# Patient Record
Sex: Male | Born: 1955
Health system: Southern US, Community
[De-identification: ages and names within clinical notes are randomized; demographics above are authoritative.]

## PROBLEM LIST (undated history)

## (undated) DIAGNOSIS — F329 Major depressive disorder, single episode, unspecified: Secondary | ICD-10-CM

## (undated) DIAGNOSIS — N433 Hydrocele, unspecified: Secondary | ICD-10-CM

## (undated) DIAGNOSIS — F32A Depression, unspecified: Secondary | ICD-10-CM

## (undated) DIAGNOSIS — N200 Calculus of kidney: Secondary | ICD-10-CM

## (undated) DIAGNOSIS — B1920 Unspecified viral hepatitis C without hepatic coma: Secondary | ICD-10-CM

## (undated) DIAGNOSIS — F419 Anxiety disorder, unspecified: Secondary | ICD-10-CM

## (undated) DIAGNOSIS — F209 Schizophrenia, unspecified: Secondary | ICD-10-CM

## (undated) DIAGNOSIS — G4700 Insomnia, unspecified: Secondary | ICD-10-CM

## (undated) DIAGNOSIS — N451 Epididymitis: Secondary | ICD-10-CM

## (undated) DIAGNOSIS — R7301 Impaired fasting glucose: Secondary | ICD-10-CM

## (undated) DIAGNOSIS — C801 Malignant (primary) neoplasm, unspecified: Secondary | ICD-10-CM

## (undated) DIAGNOSIS — I10 Essential (primary) hypertension: Secondary | ICD-10-CM

## (undated) HISTORY — DX: Epididymitis: N45.1

## (undated) HISTORY — DX: Hydrocele, unspecified: N43.3

## (undated) HISTORY — DX: Insomnia, unspecified: G47.00

## (undated) HISTORY — DX: Major depressive disorder, single episode, unspecified: F32.9

## (undated) HISTORY — DX: Unspecified viral hepatitis C without hepatic coma: B19.20

## (undated) HISTORY — DX: Anxiety disorder, unspecified: F41.9

## (undated) HISTORY — PX: ANKLE FRACTURE SURGERY: SHX122

## (undated) HISTORY — DX: Schizophrenia, unspecified: F20.9

## (undated) HISTORY — DX: Calculus of kidney: N20.0

## (undated) HISTORY — DX: Depression, unspecified: F32.A

## (undated) HISTORY — DX: Impaired fasting glucose: R73.01

---

## 2004-04-14 ENCOUNTER — Other Ambulatory Visit: Payer: Self-pay

## 2004-10-21 ENCOUNTER — Emergency Department: Payer: Self-pay | Admitting: Emergency Medicine

## 2007-09-29 ENCOUNTER — Emergency Department: Payer: Self-pay | Admitting: Emergency Medicine

## 2007-12-21 ENCOUNTER — Ambulatory Visit: Payer: Self-pay | Admitting: Gastroenterology

## 2008-01-07 ENCOUNTER — Ambulatory Visit: Payer: Self-pay | Admitting: General Surgery

## 2008-01-11 ENCOUNTER — Ambulatory Visit: Payer: Self-pay | Admitting: General Surgery

## 2008-09-19 HISTORY — PX: COLONOSCOPY: SHX174

## 2008-12-19 ENCOUNTER — Emergency Department: Payer: Self-pay | Admitting: Emergency Medicine

## 2009-02-22 ENCOUNTER — Emergency Department: Payer: Self-pay | Admitting: Emergency Medicine

## 2009-07-08 ENCOUNTER — Emergency Department: Payer: Self-pay | Admitting: Emergency Medicine

## 2009-07-10 ENCOUNTER — Emergency Department: Payer: Self-pay | Admitting: Emergency Medicine

## 2009-09-19 HISTORY — PX: HERNIA REPAIR: SHX51

## 2010-05-11 ENCOUNTER — Emergency Department: Payer: Self-pay | Admitting: Internal Medicine

## 2010-05-16 ENCOUNTER — Emergency Department: Payer: Self-pay | Admitting: Emergency Medicine

## 2011-12-17 ENCOUNTER — Emergency Department: Payer: Self-pay | Admitting: Emergency Medicine

## 2011-12-18 LAB — URINALYSIS, COMPLETE
Blood: NEGATIVE
Glucose,UR: NEGATIVE mg/dL (ref 0–75)
Ketone: NEGATIVE
Leukocyte Esterase: NEGATIVE
Nitrite: NEGATIVE
Ph: 5 (ref 4.5–8.0)
Protein: NEGATIVE
RBC,UR: 2 /HPF (ref 0–5)
Specific Gravity: 1.025 (ref 1.003–1.030)
WBC UR: 2 /HPF (ref 0–5)

## 2011-12-18 LAB — COMPREHENSIVE METABOLIC PANEL
Albumin: 3.7 g/dL (ref 3.4–5.0)
Alkaline Phosphatase: 65 U/L (ref 50–136)
BUN: 12 mg/dL (ref 7–18)
Bilirubin,Total: 0.5 mg/dL (ref 0.2–1.0)
Chloride: 107 mmol/L (ref 98–107)
Co2: 24 mmol/L (ref 21–32)
Creatinine: 0.98 mg/dL (ref 0.60–1.30)
Glucose: 113 mg/dL — ABNORMAL HIGH (ref 65–99)
Osmolality: 284 (ref 275–301)
Potassium: 3.5 mmol/L (ref 3.5–5.1)
Sodium: 142 mmol/L (ref 136–145)

## 2011-12-18 LAB — CBC
RBC: 4.88 10*6/uL (ref 4.40–5.90)
RDW: 13.1 % (ref 11.5–14.5)

## 2012-09-08 ENCOUNTER — Inpatient Hospital Stay: Payer: Self-pay | Admitting: Psychiatry

## 2012-09-08 LAB — CBC
HCT: 43.7 % (ref 40.0–52.0)
MCHC: 35.4 g/dL (ref 32.0–36.0)
Platelet: 289 10*3/uL (ref 150–440)
RBC: 4.68 10*6/uL (ref 4.40–5.90)
RDW: 13.1 % (ref 11.5–14.5)
WBC: 7.7 10*3/uL (ref 3.8–10.6)

## 2012-09-08 LAB — URINALYSIS, COMPLETE
Bilirubin,UR: NEGATIVE
Glucose,UR: NEGATIVE mg/dL (ref 0–75)
Ketone: NEGATIVE
Leukocyte Esterase: NEGATIVE
Nitrite: NEGATIVE
Ph: 5 (ref 4.5–8.0)
Protein: NEGATIVE
RBC,UR: <1 /HPF (ref 0–5)
Specific Gravity: 1.02 (ref 1.003–1.030)
Squamous Epithelial: NONE SEEN
WBC UR: <1 /HPF (ref 0–5)

## 2012-09-08 LAB — SALICYLATE LEVEL: Salicylates, Serum: <1.7 mg/dL

## 2012-09-08 LAB — COMPREHENSIVE METABOLIC PANEL
BUN: 14 mg/dL (ref 7–18)
Bilirubin,Total: 0.5 mg/dL (ref 0.2–1.0)
Chloride: 106 mmol/L (ref 98–107)
Creatinine: 1.2 mg/dL (ref 0.60–1.30)
EGFR (African American): >60
Osmolality: 281 (ref 275–301)
Potassium: 3.7 mmol/L (ref 3.5–5.1)
Sodium: 140 mmol/L (ref 136–145)
Total Protein: 8.1 g/dL (ref 6.4–8.2)

## 2012-09-08 LAB — ETHANOL: Ethanol %: <0.003 % (ref 0.000–0.080)

## 2012-09-08 LAB — DRUG SCREEN, URINE
Amphetamines, Ur Screen: NEGATIVE (ref ?–1000)
Benzodiazepine, Ur Scrn: NEGATIVE (ref ?–200)
Cannabinoid 50 Ng, Ur ~~LOC~~: NEGATIVE (ref ?–50)
Methadone, Ur Screen: NEGATIVE (ref ?–300)
Phencyclidine (PCP) Ur S: NEGATIVE (ref ?–25)
Tricyclic, Ur Screen: NEGATIVE (ref ?–1000)

## 2012-09-08 LAB — TSH: Thyroid Stimulating Horm: 6.69 u[IU]/mL — ABNORMAL HIGH

## 2013-01-02 DIAGNOSIS — R6250 Unspecified lack of expected normal physiological development in childhood: Secondary | ICD-10-CM | POA: Insufficient documentation

## 2014-03-01 ENCOUNTER — Ambulatory Visit: Payer: Self-pay | Admitting: Family Medicine

## 2014-09-07 ENCOUNTER — Emergency Department: Payer: Self-pay | Admitting: Emergency Medicine

## 2014-09-07 LAB — RAPID HIV SCREEN (HIV 1/2 AB+AG)

## 2014-10-12 ENCOUNTER — Emergency Department: Payer: Self-pay | Admitting: Emergency Medicine

## 2015-01-05 ENCOUNTER — Emergency Department
Admit: 2015-01-05 | Disposition: A | Discharge status: Home / Self Care | Payer: Self-pay | Admitting: Emergency Medicine

## 2015-01-05 LAB — URINALYSIS, COMPLETE
BACTERIA: NONE SEEN
Bilirubin,UR: NEGATIVE
Blood: NEGATIVE
GLUCOSE, UR: NEGATIVE mg/dL (ref 0–75)
Ketone: NEGATIVE
Leukocyte Esterase: NEGATIVE
NITRITE: NEGATIVE
Ph: 5 (ref 4.5–8.0)
Protein: NEGATIVE
Specific Gravity: 1.023 (ref 1.003–1.030)
Squamous Epithelial: NONE SEEN

## 2015-01-06 NOTE — H&P (Signed)
PATIENT NAME:  Mitchell [name], Mitchell [name] MR#:  [id number] DATE OF BIRTH:  [birth year]  DATE OF ADMISSION:  09/07/2012  INITIAL ASSESSMENT AND PSYCHIATRIC EVALUATION   IDENTIFYING INFORMATION: The patient is a 59 year old white male, not employed and last worked 2 or 3 months ago in [employer].  The patient divorced twice and has been living with a girlfriend.  CHIEF COMPLAINT: The patient comes for first inpatient on psychiatry at [facility] with a chief complaint, "I am having problems with withdrawal from lorazepam and Ambien as my doctor took me off because it is dangerous to my liver, and he put me on Seroquel which will help me rest; and I feel I am paranoid since then because I am going through withdrawal."   HISTORY OF PRESENT ILLNESS: According to information obtained from the chart, the patient has been feeling paranoid, and he is fearful for his life because of friends and family causing him problems.  He was on lorazepam 1 mg and Ambien at bedtime for sleep, and his doctor thought that it is not good for his liver as he already has hepatitis C and B; so he stopped this medication 1-1/2 months ago and put him on Seroquel to help him rest at night.  The patient reports that he can feel the withdrawal, and this is making him anxious and paranoid.  PAST PSYCHIATRIC HISTORY: No previous history of inpatient on psychiatry. No history of suicide attempt. He is not being followed by any psychiatrist at this time, is being followed by Dr. Jefm Bryant, who tried to discontinue him off lorazepam and Ambien which he has been taking for quite some time.   FAMILY HISTORY OF MENTAL ILLNESS: Cousins and other family members had depression.  No known history of suicides in the family.   FAMILY HISTORY: He was raised by parents.  Father worked in a [place], and father lives in an assisted living facility and is in his 28s.  Mother died of diabetic complications.  He has 2 brothers, 71 half-sister, 1 half-brother.  He  is not close to family and said, "so.so."  PERSONAL HISTORY: He was born at old [facility].  He dropped out in 7th grade because he was dumb and stupid, no GED.  WORK HISTORY:  He joined the [employer] but did not finish his basic training.  His first job was in a [place] at age 89 years.  This job lasted for a while.  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   MARRIAGES: Married twice.  The first marriage ended because she found another man. He has 2 sons who are 22 and 12 years old.  He is in touch with them sometimes.  His second marriage ended because of her having mental illness.  No children from the second marriage.    ALCOHOL AND DRUGS: He denies drinking alcohol.  He denies street or prescription drug abuse.  He denies smoking nicotine cigarettes.   MEDICAL HISTORY: Labile hypertension, diabetes mellitus, status post injury to left ankle and surgery with steel screws put in and steel rods put in. He hurt himself falling off a 28-foot ladder, remote. No history of motor vehicle accident.  He has never been unconscious.  He has hepatitis C and B positive by shooting up dope many years ago from a friend.  ALLERGIES: CODEINE.  PRIMARY CARE PHYSICIAN: He is being followed  by Dr. Jefm Bryant.  Last appointment was 1-1/2 months ago.  Next appointment is to be made.   PHYSICAL EXAMINATION: VITAL SIGNS:  Temperature is 95.6, pulse is 70 per minute regular, respirations 18 per minute regular, blood pressure is 130/71 mmHg. HEENT: Head is normocephalic, atraumatic. Eyes: PERRLA. Fundi are bilateral. EOMs are visualized. Tympanic membranes are visualized. No exudate. NECK:  Supple without any organomegaly, lymphadenopathy or thyromegaly. CHEST: Normal expansion, normal breath sounds. HEART:  Normal S1, S2 heard.  ABDOMEN:  Soft, no organomegaly. Bowel sounds heard. RECTAL: Deferred. NEUROLOGICAL:  Gait  is normal. Romberg is negative. Cranial nerves II through XII are intact. DTRs are 2+.  Plantars have normal response.  MENTAL STATUS EXAMINATION:  The patient is dressed in hospital scrubs, alert and oriented to place, person and time, aware of the situation that brought him for admission to [facility]. He knew the capitol of [place], [place], name of the current Software engineer, previous Software engineer.  He admits feeling low and down and depressed about his withdrawal from benzodiazepines.  He admits feeling hopeless and helpless at times.  He denies feeling worthless or useless.  He denies suicidal or homicidal plans.  He admits feeling paranoid about his girlfriend and the woman that she lives with.  He admits feeling suspicious about her.  He denies hearing voices, seeing things.  He denies paranoid or suspicious ideas. He denies thought insertion and thought control.  He could spell the word world forward and backward.  He could count money. Cognition is grossly intact. Judgment is intact. He does admit to withdrawal feelings which are causing him to have paranoia and suspicion and he realizes the same. Insight and judgment are guarded.   IMPRESSION: AXIS I:   1. Benzodiazepine withdrawal and anxiety related to the same. 2. History of cocaine dependence, in remission for many, many years.  3. Psychosis secondary to benzodiazepine withdrawal to be ruled out.   AXIS II:  Deferred.   AXIS III:  1. Hepatitis B and C positive. 2. Labile hypertension. 3. Borderline diabetes mellitus. 4. Status post surgery on left ankle after status post injury after a fall from a 28-foot ladder, remote, and surgery done for the same.  AXIS IV: Severe. Occupational, financial and currently paranoid and going through anxiety from withdrawal from benzodiazepine.   AXIS V:  Global Assessment of Functioning 25.  PLAN:  The patient is admitted to [facility] for close observation and  management. He will be started on Klonopin low-dose to help him with his withdrawal from benzodiazepine.  During the stay in the hospital, he will be given milieu therapy and supportive counseling, and benzodiazepine withdrawal will be addressed.    At the time of discharge, he will be stabilized and will not have any withdrawal symptoms, and he will not have any paranoia as he is on medication for the same. Appropriate follow-up appointment will be made in the community.   ____________________________ Wallace Cullens. Franchot Mimes, MD skc:cb D: 09/08/2012 20:18:07 ET T: 09/09/2012 08:10:18 ET JOB#: [id number]  cc: Arlyn Leak K. Franchot Mimes, MD, <Dictator> Dewain Penning MD ELECTRONICALLY SIGNED 09/09/2012 20:53

## 2015-02-04 ENCOUNTER — Emergency Department: Payer: No Typology Code available for payment source

## 2015-02-04 ENCOUNTER — Encounter: Payer: Self-pay | Admitting: Emergency Medicine

## 2015-02-04 ENCOUNTER — Emergency Department
Admission: EM | Admit: 2015-02-04 | Discharge: 2015-02-04 | Disposition: A | Discharge status: Home / Self Care | Payer: No Typology Code available for payment source | Attending: Emergency Medicine | Admitting: Emergency Medicine

## 2015-02-04 DIAGNOSIS — N50811 Right testicular pain: Secondary | ICD-10-CM

## 2015-02-04 DIAGNOSIS — Y9289 Other specified places as the place of occurrence of the external cause: Secondary | ICD-10-CM | POA: Insufficient documentation

## 2015-02-04 DIAGNOSIS — Y9389 Activity, other specified: Secondary | ICD-10-CM | POA: Diagnosis not present

## 2015-02-04 DIAGNOSIS — Y998 Other external cause status: Secondary | ICD-10-CM | POA: Diagnosis not present

## 2015-02-04 DIAGNOSIS — W57XXXA Bitten or stung by nonvenomous insect and other nonvenomous arthropods, initial encounter: Secondary | ICD-10-CM | POA: Diagnosis not present

## 2015-02-04 DIAGNOSIS — S30861A Insect bite (nonvenomous) of abdominal wall, initial encounter: Secondary | ICD-10-CM

## 2015-02-04 DIAGNOSIS — Z79899 Other long term (current) drug therapy: Secondary | ICD-10-CM | POA: Insufficient documentation

## 2015-02-04 DIAGNOSIS — N508 Other specified disorders of male genital organs: Secondary | ICD-10-CM | POA: Insufficient documentation

## 2015-02-04 DIAGNOSIS — S70361A Insect bite (nonvenomous), right thigh, initial encounter: Secondary | ICD-10-CM | POA: Insufficient documentation

## 2015-02-04 DIAGNOSIS — R2241 Localized swelling, mass and lump, right lower limb: Secondary | ICD-10-CM | POA: Diagnosis present

## 2015-02-04 HISTORY — DX: Essential (primary) hypertension: I10

## 2015-02-04 LAB — URINALYSIS COMPLETE WITH MICROSCOPIC (ARMC ONLY)
BILIRUBIN URINE: NEGATIVE
Bacteria, UA: NONE SEEN
Glucose, UA: NEGATIVE mg/dL
Hgb urine dipstick: NEGATIVE
KETONES UR: NEGATIVE mg/dL
Leukocytes, UA: NEGATIVE
Nitrite: NEGATIVE
PROTEIN: NEGATIVE mg/dL
SPECIFIC GRAVITY, URINE: 1.021 (ref 1.005–1.030)
SQUAMOUS EPITHELIAL / LPF: NONE SEEN
pH: 6 (ref 5.0–8.0)

## 2015-02-04 MED ORDER — OXYCODONE-ACETAMINOPHEN 5-325 MG PO TABS
ORAL_TABLET | ORAL | Status: AC
  Administered 2015-02-04: 2 via ORAL
  Filled 2015-02-04: qty 1

## 2015-02-04 MED ORDER — KETOROLAC TROMETHAMINE 10 MG PO TABS
ORAL_TABLET | ORAL | Status: AC
  Administered 2015-02-04: 10 mg via ORAL
  Filled 2015-02-04: qty 1

## 2015-02-04 MED ORDER — DOXYCYCLINE HYCLATE 100 MG PO TABS
100.0000 mg | ORAL_TABLET | Freq: Once | ORAL | Status: AC
  Administered 2015-02-04: 100 mg via ORAL
  Filled 2015-02-04: qty 1

## 2015-02-04 MED ORDER — OXYCODONE-ACETAMINOPHEN 5-325 MG PO TABS
2.0000 | ORAL_TABLET | ORAL | Status: AC
  Administered 2015-02-04: 2 via ORAL

## 2015-02-04 MED ORDER — OXYCODONE-ACETAMINOPHEN 5-325 MG PO TABS
ORAL_TABLET | ORAL | Status: DC
Start: 2015-02-04 — End: 2015-02-04
  Filled 2015-02-04: qty 1

## 2015-02-04 MED ORDER — OXYCODONE-ACETAMINOPHEN 5-325 MG PO TABS: 1.0000 | ORAL_TABLET | ORAL | Status: DC | PRN

## 2015-02-04 MED ORDER — DOXYCYCLINE HYCLATE 50 MG PO CAPS: 100.0000 mg | ORAL_CAPSULE | Freq: Two times a day (BID) | ORAL | Status: DC

## 2015-02-04 MED ORDER — DOXYCYCLINE HYCLATE 100 MG PO TABS: 100.0000 mg | ORAL_TABLET | Freq: Once | ORAL | Status: DC

## 2015-02-04 MED ORDER — KETOROLAC TROMETHAMINE 10 MG PO TABS
10.0000 mg | ORAL_TABLET | ORAL | Status: AC
  Administered 2015-02-04: 10 mg via ORAL

## 2015-02-04 NOTE — ED Notes (Signed)
Swelling to right testicle   Hx of same and dx'd with infection

## 2015-02-04 NOTE — Discharge Instructions (Signed)
As we discussed, we did not identify a specific reason for your pain today.  We recommend that you support the testicles either by patting her underwear wearing a jockstrap.  Try using an ice pack for the pain and swelling and take over-the-counter medications such as ibuprofen.  I also prescribed narcotic pain medicine that he can take for severe pain.  The most important thing is to follow-up with Dr. Erlene Quan, the urologist, with a phone call tomorrow for the next available office appointment.  We also prescribed an antibiotic for the tick exposure and rash that you have.  Please take the full course of medication as prescribed.   Testicular Self-Exam A self-exam of your testicles is looking at and feeling your testicles for abnormal lumps or swelling. Several things can cause swelling, lumps, or pain in your testicles. Some of these causes are:  Injuries.  Puffiness, redness, and soreness (inflammation).  Infection.  Extra fluids around your testicle.  Twisted testicles.  Testicular cancer. The testicles are easiest to check after warm baths or showers. They are harder to examine when you are cold.  Follow these steps while you are standing: 1. Hold your penis away from your body. 2. Roll one testicle between your thumb and finger. Feel the entire testicle. 3. Roll the other testicle between your thumb and finger. Feel the entire testicle. Feel for lumps, swelling, or discomfort. A normal testicle is egg shaped. It feels firm. It is smooth and not tender. The spermatic cord feels like a firm spaghetti-like cord. It is at the back of your testicle. Examine the crease between the front of your leg and your abdomen. Feel for any bumps that are tender. These could be enlarged lymph nodes.  Document Released: 12/02/2008 Document Revised: 06/26/2013 Document Reviewed: 02/25/2013 [facility] Patient Information 2015 [place], [place]. This information is not intended to replace advice given to you  by your health care provider. Make sure you discuss any questions you have with your health care provider.  Tick Bite Information Ticks are insects that attach themselves to the skin. There are many types of ticks. Common types include wood ticks and deer ticks. Sometimes, ticks carry diseases that can make a person very ill. The most common places for ticks to attach themselves are the scalp, neck, armpits, waist, and groin.  HOW CAN YOU PREVENT TICK BITES? Take these steps to help prevent tick bites when you are outdoors:  Wear long sleeves and long pants.  Wear white clothes so you can see ticks more easily.  Tuck your pant legs into your socks.  If walking on a trail, stay in the middle of the trail to avoid brushing against bushes.  Avoid walking through areas with long grass.  Put bug spray on all skin that is showing and along boot tops, pant legs, and sleeve cuffs.  Check clothes, hair, and skin often and before going inside.  Brush off any ticks that are not attached.  Take a shower or bath as soon as possible after being outdoors. HOW SHOULD YOU REMOVE A TICK? Ticks should be removed as soon as possible to help prevent diseases. 4. If latex gloves are available, put them on before trying to remove a tick. 5. Use tweezers to grasp the tick as close to the skin as possible. You may also use curved forceps or a tick removal tool. Grasp the tick as close to its head as possible. Avoid grasping the tick on its body. 6. Pull gently upward until  the tick lets go. Do not twist the tick or jerk it suddenly. This may break off the tick's head or mouth parts. 7. Do not squeeze or crush the tick's body. This could force disease-carrying fluids from the tick into your body. 8. After the tick is removed, wash the bite area and your hands with soap and water or alcohol. 9. Apply a small amount of antiseptic cream or ointment to the bite site. 10. Wash any tools that were used. Do not try  to remove a tick by applying a hot match, petroleum jelly, or fingernail polish to the tick. These methods do not work. They may also increase the chances of disease being spread from the tick bite. WHEN SHOULD YOU SEEK HELP? Contact your health care provider if you are unable to remove a tick or if a part of the tick breaks off in the skin. After a tick bite, you need to watch for signs and symptoms of diseases that can be spread by ticks. Contact your health care provider if you develop any of the following:  Fever.  Rash.  Redness and puffiness (swelling) in the area of the tick bite.  Tender, puffy lymph glands.  Watery poop (diarrhea).  Weight loss.  Cough.  Feeling more tired than normal (fatigue).  Muscle, joint, or bone pain.  Belly (abdominal) pain.  Headache.  Change in your level of consciousness.  Trouble walking or moving your legs.  Loss of feeling (numbness) in the legs.  Loss of movement (paralysis).  Shortness of breath.  Confusion.  Throwing up (vomiting) many times. Document Released: 11/30/2009 Document Revised: 05/08/2013 Document Reviewed: 02/13/2013 [facility] Patient Information 2015 [place], [place]. This information is not intended to replace advice given to you by your health care provider. Make sure you discuss any questions you have with your health care provider.

## 2015-02-04 NOTE — ED Provider Notes (Signed)
[facility] Emergency Department Provider Note  ____________________________________________  Time seen: Approximately 6:26 PM  I have reviewed the triage vital signs and the nursing notes.   HISTORY  Chief Complaint Groin Swelling    HPI NAHOME BUBLITZ is a 59 y.o. male with a recent history of being treated for epididymitis who presents with right testicular swelling and pain starting today.  He is concerned because it feels the same as before. The patient does not identify any specific trauma, but he did admit that his girlfriend likes "rough sex" and that it is possible that may contribute.  He has one sexual partner with whom he has been monogamous for 3 months.  They both took antibiotics when he was treated about a month ago.  He denies fever, chills, chest pain, shortness of breath, nausea, vomiting.  The pain is severe but intermittent.   Past Medical History  Diagnosis Date  . Hypertension     There are no active problems to display for this patient.   History reviewed. No pertinent past surgical history.  Current Outpatient Rx  Name  Route  Sig  Dispense  Refill  . clonazePAM (KLONOPIN) 0.5 MG tablet   Oral   Take 0.5 mg by mouth 3 (three) times daily as needed for anxiety.         Marland Kitchen doxycycline (VIBRAMYCIN) 50 MG capsule   Oral   Take 2 capsules (100 mg total) by mouth 2 (two) times daily.   40 capsule   0   . oxyCODONE-acetaminophen (ROXICET) 5-325 MG per tablet   Oral   Take 1-2 tablets by mouth every 4 (four) hours as needed for severe pain.   20 tablet   0     Allergies Codeine  No family history on file.  Social History History  Substance Use Topics  . Smoking status: Never Smoker   . Smokeless tobacco: Not on file  . Alcohol Use: No    Review of Systems Constitutional: No fever/chills Eyes: No visual changes. ENT: No sore throat. Cardiovascular: Denies chest pain. Respiratory: Denies shortness of  breath. Gastrointestinal: No abdominal pain.  No nausea, no vomiting.  No diarrhea.  No constipation. Genitourinary: Negative for dysuria.  Right testicular pain and swelling Musculoskeletal: Negative for back pain. Skin: Negative for rash. Neurological: Negative for headaches, focal weakness or numbness.  10-point ROS otherwise negative.  ____________________________________________   PHYSICAL EXAM:  VITAL SIGNS: ED Triage Vitals  Enc Vitals Group     BP 02/04/15 1658 128/74 mmHg     Pulse Rate 02/04/15 1658 66     Resp 02/04/15 1658 20     Temp 02/04/15 1658 98.7 F (37.1 C)     Temp Source 02/04/15 1658 Oral     SpO2 02/04/15 1658 99 %     Weight 02/04/15 1658 166 lb (75.297 kg)     Height 02/04/15 1658 5\' 3"  (1.6 m)     Head Cir --      Peak Flow --      Pain Score 02/04/15 1659 8     Pain Loc --      Pain Edu? --      Excl. in [place]? --     Constitutional: Alert and oriented.  Anxious.  Well appearing and in no acute distress. Eyes: Conjunctivae are normal. PERRL. EOMI. Head: Atraumatic. Nose: No congestion/rhinnorhea. Mouth/Throat: Mucous membranes are moist.  Oropharynx non-erythematous. Neck: No stridor.   Cardiovascular: Normal rate, regular rhythm. Grossly  normal heart sounds.  Good peripheral circulation. Respiratory: Normal respiratory effort.  No retractions. Lungs CTAB. Gastrointestinal: Soft and nontender. No distention. No abdominal bruits. No CVA tenderness. Genitourinary: Normal circumcised male genital exam.  No exquisite tenderness to palpation of either testicle or epididymis.  No appreciable swelling.  No inguinal hernia noted. Musculoskeletal: No lower extremity tenderness nor edema.  No joint effusions. Neurologic:  Normal speech and language. No gross focal neurologic deficits are appreciated. Speech is normal. No gait instability. Skin:  Skin is warm, dry and intact.  Patient has an erythematous rash in his right inguinal region where he states he  pulled a tick off recently.  There is no bull's-eye lesion but a large area of erythema Psychiatric: Mood and affect are normal. Speech and behavior are normal.  ____________________________________________   LABS (all labs ordered are listed, but only abnormal results are displayed)  Labs Reviewed  URINALYSIS COMPLETEWITH MICROSCOPIC [facility])  - Abnormal; Notable for the following:    Color, Urine YELLOW (*)    APPearance CLEAR (*)    All other components within normal limits   ____________________________________________  EKG  Not indicated ____________________________________________  RADIOLOGY  US Scrotum  02/04/2015   CLINICAL DATA:  Acute right testicular pain.  EXAM: ULTRASOUND OF SCROTUM  TECHNIQUE: Complete ultrasound examination of the testicles, epididymis, and other scrotal structures was performed.  COMPARISON:  Ultrasound of January 05, 2015.  FINDINGS: Right testicle  Measurements: 4.1 x 2.7 x 1.7 cm. No mass or microlithiasis visualized.  Left testicle  Measurements: 3.3 x 2.8 x 1.5 cm. No mass or microlithiasis visualized.  Right epididymis:  Normal in size and appearance.  Left epididymis:  6 mm cyst is noted and unchanged.  Hydrocele:  Mild bilateral hydroceles are noted.  Varicocele:  None visualized.  IMPRESSION: Small left epididymal cyst. Mild bilateral hydroceles. No testicular abnormality seen.   Electronically Signed   By: Marijo Conception, M.D.   On: 02/04/2015 18:30    ____________________________________________   PROCEDURES  Procedure(s) performed: None  Critical Care performed: No  ____________________________________________   INITIAL IMPRESSION / ASSESSMENT AND PLAN / ED COURSE  Pertinent labs & imaging results that were available during my care of the patient were reviewed by me and considered in my medical decision making (see chart for details).  I do not have a specific explanation for the patient's pain.  His exam is reassuring and his  ultrasound shows no sign of torsion nor of epididymitis.  His urinalysis is normal.  We discussed care with elevation/support, ice, and NSAIDs.  I advised close outpatient follow-up with Dr. Erlene Quan with urology.I am also going to treat his recent tick exposure and the erythema and his groin with a dose of doxycycline.    ----------------------------------------- 7:54 PM on 02/04/2015 -----------------------------------------  The patient became very angry and impatient while awaiting discharge papers and medications, pacing back and forth and made a move as if to punch the wall.  I asked him to sit back down in his room to await his papers medications and he was verbally aggressive and agitated and advised that if he continues to be aggressive I will call the police.  I explained we are working as fast as we can.  The nurse [facility]) went to get his medications and will discharge him shortly. ____________________________________________   FINAL CLINICAL IMPRESSION(S) / ED DIAGNOSES  Final diagnoses:  Tick bite of groin, initial encounter  Right testicular pain     Hinda Kehr, MD  02/04/15 1957 

## 2015-05-06 DIAGNOSIS — N433 Hydrocele, unspecified: Secondary | ICD-10-CM | POA: Diagnosis not present

## 2015-05-06 DIAGNOSIS — R9431 Abnormal electrocardiogram [ECG] [EKG]: Secondary | ICD-10-CM | POA: Diagnosis not present

## 2015-05-06 DIAGNOSIS — N503 Cyst of epididymis: Secondary | ICD-10-CM | POA: Diagnosis not present

## 2015-05-06 DIAGNOSIS — R072 Precordial pain: Secondary | ICD-10-CM | POA: Diagnosis not present

## 2015-05-06 DIAGNOSIS — Z885 Allergy status to narcotic agent status: Secondary | ICD-10-CM | POA: Diagnosis not present

## 2015-05-06 DIAGNOSIS — N508 Other specified disorders of male genital organs: Secondary | ICD-10-CM | POA: Diagnosis not present

## 2015-05-06 DIAGNOSIS — F419 Anxiety disorder, unspecified: Secondary | ICD-10-CM | POA: Diagnosis not present

## 2015-05-06 DIAGNOSIS — Z79899 Other long term (current) drug therapy: Secondary | ICD-10-CM | POA: Diagnosis not present

## 2015-05-06 DIAGNOSIS — N451 Epididymitis: Secondary | ICD-10-CM | POA: Diagnosis not present

## 2015-05-06 DIAGNOSIS — R0789 Other chest pain: Secondary | ICD-10-CM | POA: Diagnosis not present

## 2015-05-06 DIAGNOSIS — I10 Essential (primary) hypertension: Secondary | ICD-10-CM | POA: Diagnosis not present

## 2015-05-06 DIAGNOSIS — Z8619 Personal history of other infectious and parasitic diseases: Secondary | ICD-10-CM | POA: Diagnosis not present

## 2015-05-06 DIAGNOSIS — R7309 Other abnormal glucose: Secondary | ICD-10-CM | POA: Diagnosis not present

## 2015-05-06 DIAGNOSIS — R079 Chest pain, unspecified: Secondary | ICD-10-CM | POA: Diagnosis not present

## 2015-05-06 DIAGNOSIS — R200 Anesthesia of skin: Secondary | ICD-10-CM | POA: Diagnosis not present

## 2015-05-06 DIAGNOSIS — Z87891 Personal history of nicotine dependence: Secondary | ICD-10-CM | POA: Diagnosis not present

## 2015-05-07 DIAGNOSIS — I517 Cardiomegaly: Secondary | ICD-10-CM | POA: Diagnosis not present

## 2015-05-07 DIAGNOSIS — N433 Hydrocele, unspecified: Secondary | ICD-10-CM | POA: Diagnosis not present

## 2015-05-07 DIAGNOSIS — R079 Chest pain, unspecified: Secondary | ICD-10-CM | POA: Diagnosis not present

## 2015-05-07 DIAGNOSIS — F419 Anxiety disorder, unspecified: Secondary | ICD-10-CM | POA: Diagnosis not present

## 2015-05-07 DIAGNOSIS — I10 Essential (primary) hypertension: Secondary | ICD-10-CM | POA: Diagnosis not present

## 2015-05-07 DIAGNOSIS — R0789 Other chest pain: Secondary | ICD-10-CM | POA: Diagnosis not present

## 2015-05-22 ENCOUNTER — Ambulatory Visit: Payer: Self-pay | Admitting: Family Medicine

## 2015-06-08 ENCOUNTER — Ambulatory Visit (INDEPENDENT_AMBULATORY_CARE_PROVIDER_SITE_OTHER): Payer: Medicare Other | Admitting: Family Medicine

## 2015-06-08 ENCOUNTER — Encounter: Payer: Self-pay | Admitting: Family Medicine

## 2015-06-08 VITALS — BP 114/77 | HR 61 | Temp 97.9°F | Ht 61.5 in | Wt 159.0 lb

## 2015-06-08 DIAGNOSIS — N433 Hydrocele, unspecified: Secondary | ICD-10-CM

## 2015-06-08 DIAGNOSIS — Z23 Encounter for immunization: Secondary | ICD-10-CM | POA: Diagnosis not present

## 2015-06-08 DIAGNOSIS — F329 Major depressive disorder, single episode, unspecified: Secondary | ICD-10-CM

## 2015-06-08 DIAGNOSIS — Z13220 Encounter for screening for lipoid disorders: Secondary | ICD-10-CM | POA: Diagnosis not present

## 2015-06-08 DIAGNOSIS — Z113 Encounter for screening for infections with a predominantly sexual mode of transmission: Secondary | ICD-10-CM

## 2015-06-08 DIAGNOSIS — Z125 Encounter for screening for malignant neoplasm of prostate: Secondary | ICD-10-CM

## 2015-06-08 DIAGNOSIS — Z1211 Encounter for screening for malignant neoplasm of colon: Secondary | ICD-10-CM

## 2015-06-08 DIAGNOSIS — I10 Essential (primary) hypertension: Secondary | ICD-10-CM | POA: Diagnosis not present

## 2015-06-08 DIAGNOSIS — F419 Anxiety disorder, unspecified: Secondary | ICD-10-CM | POA: Diagnosis not present

## 2015-06-08 DIAGNOSIS — F32A Depression, unspecified: Secondary | ICD-10-CM | POA: Insufficient documentation

## 2015-06-08 DIAGNOSIS — B1920 Unspecified viral hepatitis C without hepatic coma: Secondary | ICD-10-CM

## 2015-06-08 DIAGNOSIS — R300 Dysuria: Secondary | ICD-10-CM

## 2015-06-08 MED ORDER — ASPIRIN EC 81 MG PO TBEC: 81.0000 mg | DELAYED_RELEASE_TABLET | Freq: Every day | ORAL | Status: DC

## 2015-06-08 NOTE — Assessment & Plan Note (Signed)
S/P Harvoni. Continue to follow with [facility] GI as needed.

## 2015-06-08 NOTE — Assessment & Plan Note (Signed)
Continue to follow with psychiatrist. Continue to monitor.

## 2015-06-08 NOTE — Progress Notes (Signed)
BP 114/77 mmHg  Pulse 61  Temp(Src) 97.9 F (36.6 C)  Ht 5' 1.5" (1.562 m)  Wt 159 lb (72.122 kg)  BMI 29.56 kg/m2  SpO2 96%   Subjective:    Patient ID: Mitchell [name], male    DOB: 1956/01/02, 59 y.o.   MRN: [id number]  HPI: Mitchell [name] is a 59 y.o. male  Chief Complaint  Patient presents with  . Establish Care   HYPERTENSION Hypertension status: controlled  Satisfied with current treatment? yes Duration of hypertension: chronic BP monitoring frequency:  not checking BP medication side effects:  no Medication compliance: excellent compliance Aspirin: yes Recurrent headaches: no Visual changes: no Palpitations: no Dyspnea: no Chest pain: no Lower extremity edema: no Dizzy/lightheaded: no  ANXIETY/STRESS- hospitalized for withdrawal from lorazepam and ambien in 2013- sees Psychiatrist, Dr. Theone Stanley, at [facility] Duration:stable Anxious mood: yes  Excessive worrying: yes Irritability: yes  Sweating: yes Nausea: yes Palpitations:yes Hyperventilation: yes Panic attacks: yes Agoraphobia: no  Obscessions/compulsions: no Depressed mood: no Anhedonia: no Weight changes: no Insomnia: no   Hypersomnia: no Fatigue/loss of energy: no Feelings of worthlessness: no Feelings of guilt: no Suicidal ideations: no  Crying spells: no Recent Stressors/Life Changes: no   Relationship problems: no   Family stress: no     Financial stress: no    Job stress: no    Recent death/loss: no  History of Hep B and Hep C- has been treated with Harvoni- follows with [facility]  Hospitalized at [facility] in August for atypical chest pain, diagnosed with large R hydrocele and ?R epididymitis- went back down the antibiotics   Relevant past medical, surgical, family and social history reviewed and updated as indicated. Interim medical history since our last visit reviewed. Allergies and medications reviewed and updated.  Review of Systems  Constitutional: Negative.   Respiratory: Negative.    Cardiovascular: Negative.   Gastrointestinal: Negative.   Musculoskeletal: Negative.   Psychiatric/Behavioral: Negative.     Per HPI unless specifically indicated above     Objective:    BP 114/77 mmHg  Pulse 61  Temp(Src) 97.9 F (36.6 C)  Ht 5' 1.5" (1.562 m)  Wt 159 lb (72.122 kg)  BMI 29.56 kg/m2  SpO2 96%  Wt Readings from Last 3 Encounters:  06/08/15 159 lb (72.122 kg)  02/04/15 166 lb (75.297 kg)    Physical Exam  Constitutional: He is oriented to person, place, and time. He appears well-developed and well-nourished. No distress.  HENT:  Head: Normocephalic and atraumatic.  Right Ear: Hearing normal.  Left Ear: Hearing normal.  Nose: Nose normal.  Eyes: Conjunctivae and lids are normal. Right eye exhibits no discharge. Left eye exhibits no discharge. No scleral icterus.  Cardiovascular: Normal rate, regular rhythm, normal heart sounds and intact distal pulses.  Exam reveals no gallop and no friction rub.   No murmur heard. Pulmonary/Chest: Effort normal and breath sounds normal. No respiratory distress. He has no wheezes. He has no rales.  Musculoskeletal: Normal range of motion.  Neurological: He is alert and oriented to person, place, and time.  Skin: Skin is warm, dry and intact. No rash noted. No pallor.  Psychiatric: He has a normal mood and affect. His speech is normal and behavior is normal. Judgment and thought content normal. Cognition and memory are normal.  Nursing note and vitals reviewed.   Results for orders placed or performed during the hospital encounter of 02/04/15  Urinalysis complete, with microscopic [facility])  Result Value Ref Range  Color, Urine YELLOW (A) YELLOW   APPearance CLEAR (A) CLEAR   Glucose, UA NEGATIVE NEGATIVE mg/dL   Bilirubin Urine NEGATIVE NEGATIVE   Ketones, ur NEGATIVE NEGATIVE mg/dL   Specific Gravity, Urine 1.021 1.005 - 1.030   Hgb urine dipstick NEGATIVE NEGATIVE   pH 6.0 5.0 - 8.0   Protein, ur NEGATIVE NEGATIVE  mg/dL   Nitrite NEGATIVE NEGATIVE   Leukocytes, UA NEGATIVE NEGATIVE   RBC / HPF 0-5 0 - 5 RBC/hpf   WBC, UA 0-5 0 - 5 WBC/hpf   Bacteria, UA NONE SEEN NONE SEEN   Squamous Epithelial / LPF NONE SEEN NONE SEEN      Assessment & Plan:   Problem List Items Addressed This Visit      Cardiovascular and Mediastinum   Hypertension - Primary    Under good control on current regimen. Continue current regimen. Continue to monitor.       Relevant Medications   lisinopril (PRINIVIL,ZESTRIL) 5 MG tablet   aspirin EC 81 MG tablet     Digestive   Hepatitis C    S/P Harvoni. Continue to follow with [facility] as needed.         Other   Anxiety    Continue to follow with psychiatrist. Continue to monitor.       Depression    Continue to follow with psychiatrist. Continue to monitor.        Other Visit Diagnoses    Colon cancer screening        Sees [facility]- referral generated. Will see if he's due.     Relevant Orders    Ambulatory referral to Gastroenterology    Immunization due        Flu given today. Rx for zostavax given today to have done at the pharmacy.     Relevant Orders    Flu Vaccine QUAD 36+ mos PF IM (Fluarix & Fluzone Quad PF) (Completed)        Follow up plan: Return in about 4 weeks (around 07/06/2015) for Physical, Records release.

## 2015-06-08 NOTE — Assessment & Plan Note (Signed)
Under good control on current regimen. Continue current regimen. Continue to monitor.  

## 2015-06-08 NOTE — Patient Instructions (Signed)
Hydrocele, Adult Fluid can collect around the testicles. This fluid forms in a sac. This condition is called a hydrocele. The collected fluid causes swelling of the scrotum. Usually, it affects just one testicle. Most of the time, the condition does not cause pain. Sometimes, the hydrocele goes away on its own. Other times, surgery is needed to get rid of the fluid. CAUSES A hydrocele does not develop often. Different things can cause a hydrocele in a man, including:  Injury to the scrotum.  Infection.  X-ray of the area around the scrotum.  A tumor or cancer of the testicle.  Twisting of a testicle.  Decreased blood flow to the scrotum. SYMPTOMS   Swelling without pain. The hydrocele feels like a water-filled balloon.  Swelling with pain. This can occur if the hydrocele was caused by infection or twisting.  Mild discomfort in the scrotum.  The hydrocele may feel heavy.  Swelling that gets smaller when you lie down. DIAGNOSIS  Your caregiver will do a physical exam to decide if you have a hydrocele. This may include:  Asking questions about your overall health, today and in the past. Your caregiver may ask about any injuries, X-rays, or infections.  Pushing on your abdomen or asking you to change positions to see if the size of the hydrocele changes.  Shining a light through the scrotum (transillumination) to see if the fluid inside the scrotum is clear.  Blood tests and urine tests to check for infection.  Imaging studies that take pictures of the scrotum and testicles. TREATMENT  Treatment depends in part on what caused the condition. Options include:  Watchful waiting. Your caregiver checks the hydrocele every so often.  Different surgeries to drain the fluid.  A needle may be put into the scrotum to drain fluid (needle aspiration). Fluid often returns after this type of treatment.  A cut (incision) may be made in the scrotum to remove the fluid sac  (hydrocelectomy).  An incision may be made in the groin to repair a hydrocele that has contact with abdominal fluids (communicating hydrocele).  Medicines to treat an infection (antibiotics). HOME CARE INSTRUCTIONS  What you need to do at home may depend on the cause of the hydrocele and type of treatment. In general:  Take all medicine as directed by your caregiver. Follow the directions carefully.  Ask your caregiver if there is anything you should not do while you recover (activities, lifting, work, sex).  If you had surgery to repair a communicating hydrocele, recovery time may vary. Ask you caregiver about your recovery time.  Avoid heavy lifting for 4 to 6 weeks.  If you had an incision on the scrotum or groin, wash it for 2 to 3 days after surgery. Do this as long as the skin is closed and there are no gaps in the wound. Wash gently, and avoid rubbing the incision.  Keep all follow-up appointments. SEEK MEDICAL CARE IF:   Your scrotum seems to be getting larger.  The area becomes more and more uncomfortable. SEEK IMMEDIATE MEDICAL CARE IF:  You have a fever. Document Released: 02/23/2010 Document Revised: 06/26/2013 Document Reviewed: 02/23/2010 ExitCare Patient Information 2015 [employer]. This information is not intended to replace advice given to you by your health care provider. Make sure you discuss any questions you have with your health care provider.  

## 2015-06-09 ENCOUNTER — Other Ambulatory Visit: Payer: Self-pay | Admitting: *Deleted

## 2015-06-09 MED ORDER — LISINOPRIL 5 MG PO TABS: 5.0000 mg | ORAL_TABLET | Freq: Every day | ORAL | Status: DC

## 2015-07-01 ENCOUNTER — Telehealth: Payer: Self-pay

## 2015-07-01 NOTE — Telephone Encounter (Signed)
Patient called, he would like a prescription for Viagra or Cialis

## 2015-07-01 NOTE — Telephone Encounter (Signed)
Needs appointment to discuss medicine

## 2015-07-02 NOTE — Telephone Encounter (Signed)
Patient notified, he will wait until his appointment on the 27th.

## 2015-07-06 ENCOUNTER — Encounter: Payer: Self-pay | Admitting: Family Medicine

## 2015-07-06 DIAGNOSIS — G4700 Insomnia, unspecified: Secondary | ICD-10-CM | POA: Insufficient documentation

## 2015-07-06 DIAGNOSIS — R7301 Impaired fasting glucose: Secondary | ICD-10-CM | POA: Insufficient documentation

## 2015-07-06 DIAGNOSIS — F209 Schizophrenia, unspecified: Secondary | ICD-10-CM | POA: Insufficient documentation

## 2015-07-08 ENCOUNTER — Ambulatory Visit (INDEPENDENT_AMBULATORY_CARE_PROVIDER_SITE_OTHER): Payer: Medicare Other | Admitting: Family Medicine

## 2015-07-08 ENCOUNTER — Encounter: Payer: Self-pay | Admitting: Family Medicine

## 2015-07-08 VITALS — BP 117/78 | HR 62 | Temp 96.1°F | Ht 62.1 in | Wt 162.0 lb

## 2015-07-08 DIAGNOSIS — N433 Hydrocele, unspecified: Secondary | ICD-10-CM | POA: Diagnosis not present

## 2015-07-08 DIAGNOSIS — N5089 Other specified disorders of the male genital organs: Secondary | ICD-10-CM | POA: Diagnosis not present

## 2015-07-08 DIAGNOSIS — N522 Drug-induced erectile dysfunction: Secondary | ICD-10-CM | POA: Diagnosis not present

## 2015-07-08 LAB — UA/M W/RFLX CULTURE, ROUTINE
Bilirubin, UA: NEGATIVE
GLUCOSE, UA: NEGATIVE
Ketones, UA: NEGATIVE
Leukocytes, UA: NEGATIVE
Nitrite, UA: NEGATIVE
PH UA: 7 (ref 5.0–7.5)
PROTEIN UA: NEGATIVE
RBC, UA: NEGATIVE
Specific Gravity, UA: 1.02 (ref 1.005–1.030)
Urobilinogen, Ur: 0.2 mg/dL (ref 0.2–1.0)

## 2015-07-08 MED ORDER — SILDENAFIL CITRATE 100 MG PO TABS: 100.0000 mg | ORAL_TABLET | Freq: Every day | ORAL | Status: DC | PRN

## 2015-07-08 NOTE — Patient Instructions (Signed)
Hydrocele, Adult  A hydrocele is a collection of fluid in the loose pouch of skin that holds the testicles (scrotum). Usually, it affects only one testicle.  CAUSES  This condition may be caused by:  · An injury to the scrotum.  · An infection.  · A tumor or cancer of the testicle.  · Twisting of a testicle.  · Decreased blood flow to the scrotum.  SYMPTOMS  A hydrocele feels like a water-filled balloon. It may also feel heavy. A hydrocele can cause:  · Swelling of the scrotum. The swelling may decrease when you lie down.  · Swelling of the groin.  · Mild discomfort in the scrotum.  · Pain. This can develop if the hydrocele was caused by infection or twisting.  DIAGNOSIS  This condition may be diagnosed with a medical history, physical exam, and imaging tests. You may also have blood and urine tests to check for infection.  TREATMENT  Treatment may include:  · Watching and waiting, particularly if the hydrocele causes no symptoms.  · Treatment of the underlying condition. This may include using antibiotic medicine.  · Surgery to drain the fluid. Some surgical options include:    Needle aspiration. For this procedure, a needle is used to drain fluid.    Hydrocelectomy. For this procedure, an incision is made in the scrotum to remove the fluid sac.  HOME CARE INSTRUCTIONS  · Keep all follow-up visits as told by your health care provider. This is important.  · Watch the hydrocele for any changes.  · Take over-the-counter and prescription medicines only as told by your health care provider.  · If you were prescribed an antibiotic medicine, use it as told by your health care provider. Do not stop using the antibiotic even if your condition improves.  SEEK MEDICAL CARE IF:  · The swelling in your scrotum or groin gets worse.  · The hydrocele becomes red, firm, tender to the touch, or painful.  · You notice any changes in the hydrocele.  · You have a fever.     This information is not intended to replace advice given to  you by your health care provider. Make sure you discuss any questions you have with your health care provider.     Document Released: 02/23/2010 Document Revised: 01/20/2015 Document Reviewed: 09/01/2014  Elsevier Interactive Patient Education ©2016 Elsevier Inc.

## 2015-07-08 NOTE — Assessment & Plan Note (Signed)
Likely due to his haldol. Did well with viagra in the past. Rx sent to his pharmacy. Discussed risks and benefits including the need to tell all EMTs that he has taken it and not to take any nitrates with it.

## 2015-07-08 NOTE — Progress Notes (Signed)
BP 117/78 mmHg  Pulse 62  Temp(Src) 96.1 F (35.6 C)  Ht 5' 2.1" (1.577 m)  Wt 162 lb (73.483 kg)  BMI 29.55 kg/m2  SpO2 96%   Subjective:    Patient ID: Mitchell [name], male    DOB: [birth year], 59 y.o.   MRN: [id number]  HPI: Mitchell [name] is a 59 y.o. male  Chief Complaint  Patient presents with  . Hydrocele    Patient states that it keeps coming back.  . Erectile Dysfunction    Patient would like a prescriptioin for Sildenafil   ERECTILE PROBLEM- has been taking it from a friend, works well for him Duration: chronic Onset: gradual Problem with getting erections: yes Problem with sustaining erections: yes Poor erectile quality: yes Frequency of dysfunction: constant Main problem: getting and keeping an erection Morning/nocturnal erections: [name] Decreased libido: [name] New medications: [name] Sexual activity: monogamous Relationship problems: [name] Good communication with sexual partner: fair   Satisfaction with current partner: yes Anxiety: yes, chronic Treatments attempted: viagra Hypertension: yes Diabetes: [name], IFG Atherosclerosis: [name] Spinal cord disease/injury: [name] Pelvic trauma or surgery: [name]  TESTICULAR Swelling- Known hydrocele Duration: 2-3 days   Location: right  Severity: mild Quality: tight and aching Onset: gradual Frequency: 3x in last 5 months Radiation: into the groin Fevers: [name] Trauma: [name]  Sexually transmitted diseases: yes- at 17, nothing recently Relief with elevation of scrotum: [name] Chills: [name] Malaise: yes Dysuria: [name] Urinary frequency: [name] Urinary urgency: [name] Penile discharge: [name] Hematuria: [name] Abdominal pain: [name] Nausea: [name] Vomiting: [name] Swollen lymph nodes: [name] Rash: [name] Status: worse Treatments attempted: none  Relevant past medical, surgical, family and social history reviewed and updated as indicated. Interim medical history since our last visit reviewed. Allergies and medications reviewed and updated.  Review of Systems  Constitutional:  Negative.   Respiratory: Negative.   Cardiovascular: Negative.   Gastrointestinal: Negative.   Genitourinary: Positive for scrotal swelling. Negative for dysuria, urgency, frequency, hematuria, flank pain, decreased urine volume, discharge, penile swelling, enuresis, difficulty urinating, genital sores, penile pain and testicular pain.  Psychiatric/Behavioral: Negative.     Per HPI unless specifically indicated above     Objective:    BP 117/78 mmHg  Pulse 62  Temp(Src) 96.1 F (35.6 C)  Ht 5' 2.1" (1.577 m)  Wt 162 lb (73.483 kg)  BMI 29.55 kg/m2  SpO2 96%  Wt Readings from Last 3 Encounters:  07/08/15 162 lb (73.483 kg)  06/08/15 159 lb (72.122 kg)  02/04/15 166 lb (75.297 kg)    Physical Exam  Abdominal: Hernia confirmed negative in the right inguinal area and confirmed negative in the left inguinal area.  Genitourinary: Penis normal. Right testis shows swelling. Right testis shows [name] mass and [name] tenderness. Right testis is descended. Cremasteric reflex is not absent on the right side. Left testis shows [name] mass, [name] swelling and [name] tenderness. Left testis is descended. Cremasteric reflex is not absent on the left side. Circumcised. [name] phimosis, paraphimosis, hypospadias, penile erythema or penile tenderness. [name] discharge found.  Significant swelling of the R scrotum, [name] tenderness to palpation of the testicle or the epidydimis  Lymphadenopathy:       Right: [name] inguinal adenopathy present.       Left: [name] inguinal adenopathy present.    Results for orders placed or performed during the hospital encounter of 02/04/15  Urinalysis complete, with microscopic [facility])  Result Value Ref Range   Color, Urine YELLOW (A) YELLOW   APPearance CLEAR (A)  CLEAR   Glucose, UA NEGATIVE NEGATIVE mg/dL   Bilirubin Urine NEGATIVE NEGATIVE   Ketones, ur NEGATIVE NEGATIVE mg/dL   Specific Gravity, Urine 1.021 1.005 - 1.030   Hgb urine dipstick NEGATIVE NEGATIVE   pH 6.0 5.0 - 8.0   Protein, ur  NEGATIVE NEGATIVE mg/dL   Nitrite NEGATIVE NEGATIVE   Leukocytes, UA NEGATIVE NEGATIVE   RBC / HPF 0-5 0 - 5 RBC/hpf   WBC, UA 0-5 0 - 5 WBC/hpf   Bacteria, UA NONE SEEN NONE SEEN   Squamous Epithelial / LPF NONE SEEN NONE SEEN      Assessment & Plan:   Problem List Items Addressed This Visit      Genitourinary   Hydrocele, right - Primary    Chronic issue that has been acting up recently. Referral to urology generated. Patient given appoitment time today.      Relevant Orders   Ambulatory referral to Urology     Other   Drug-induced erectile dysfunction    Likely due to his haldol. Did well with viagra in the past. Rx sent to his pharmacy. Discussed risks and benefits including the need to tell all EMTs that he has taken it and not to take any nitrates with it.        Other Visit Diagnoses    Scrotal swelling        Will check GC/chalmydia and UA. Await results. Referral to urology pending.     Relevant Orders    UA/M w/rflx Culture, Routine    GC/Chlamydia Probe Amp        Follow up plan: Return in about 4 weeks (around 08/05/2015) for Physical.

## 2015-07-08 NOTE — Assessment & Plan Note (Signed)
Chronic issue that has been acting up recently. Referral to urology generated. Patient given appoitment time today.

## 2015-07-09 LAB — GC/CHLAMYDIA PROBE AMP
Chlamydia trachomatis, NAA: NEGATIVE
Neisseria gonorrhoeae by PCR: NEGATIVE

## 2015-07-13 ENCOUNTER — Encounter: Payer: Self-pay | Admitting: Urology

## 2015-07-13 ENCOUNTER — Ambulatory Visit (INDEPENDENT_AMBULATORY_CARE_PROVIDER_SITE_OTHER): Payer: Medicare Other | Admitting: Urology

## 2015-07-13 VITALS — BP 122/78 | HR 65 | Ht 63.0 in | Wt 163.7 lb

## 2015-07-13 DIAGNOSIS — N5089 Other specified disorders of the male genital organs: Secondary | ICD-10-CM

## 2015-07-13 DIAGNOSIS — N400 Enlarged prostate without lower urinary tract symptoms: Secondary | ICD-10-CM

## 2015-07-13 NOTE — Progress Notes (Signed)
07/13/2015 11:34 AM   Mitchell [name] 09-Jul-1956 [id number]  Referring provider: Valerie Roys, DO [place], [place] [address]  Chief Complaint  Patient presents with  . Hydrocele    New Patient    HPI: Patient is a 59 year old white male who complains of a right scrotal swelling that comes and goes over the last three months.  He is referred to [place] by his primary care physician, Dr. [name] for further evaluation and management.    Patient states approximately 2 months ago he noticed his right scrotum was very swollen and tender. He sought treatment in the emergency room and was given antibiotic.  A scrotal ultrasound did not note any sign of torsion or epididymitis. He was noted to have small bilateral hydroceles and a small left epididymal cyst.  Patient stated after the antibiotic the swelling didn't abate. It then returned 3 weeks later and he was given another antibiotic. It then recurred again.  He states the swelling does become quite large. He states he can get his large as an orange. He states the swelling even is up into his right groin area.  He states he notices it more frequently after his girlfriend performs vigorous oral sex on him.    He does not have any urinary symptoms or erection problems.  He is not having a penile discharge, dysuria or suprapubic pain.  I reviewed his images and noted the bilateral hydroceles and the small left epididymal cyst.    PMH: Past Medical History  Diagnosis Date  . Hepatitis C     Tested positive, treated now told that he does not have it  . Anxiety   . Hypertension   . Depression   . IFG (impaired fasting glucose)   . Schizophrenia ([place])   . Insomnia   . Hydrocele   . Epididymitis   . Kidney stone     Surgical History: Past Surgical History  Procedure Laterality Date  . Ankle fracture surgery    . Colonoscopy  2010  . Hernia repair  2011    X 2    Home Medications:    Medication List       This list is  accurate as of: 07/13/15 11:34 AM.  Always use your most recent med list.               AMBIEN 5 MG tablet  Generic drug:  zolpidem  Take 10 mg by mouth.     aspirin EC 81 MG tablet  Take 1 tablet (81 mg total) by mouth daily.     clonazePAM 0.5 MG tablet  Commonly known as:  KLONOPIN  Take 0.5 mg by mouth 3 (three) times daily as needed for anxiety.     haloperidol 1 MG tablet  Commonly known as:  HALDOL     lisinopril 5 MG tablet  Commonly known as:  PRINIVIL,ZESTRIL  Take 1 tablet (5 mg total) by mouth daily.     sildenafil 100 MG tablet  Commonly known as:  VIAGRA  Take 1 tablet (100 mg total) by mouth daily as needed for erectile dysfunction.        Allergies:  Allergies  Allergen Reactions  . Codeine Nausea And Vomiting    Family History: Family History  Problem Relation Age of Onset  . Diabetes Mother   . Alzheimer's disease Father   . Diabetes Brother   . Diabetes Maternal Grandfather     Social History:  reports that he quit smoking about 20 years ago. He has never used smokeless tobacco. He reports that he does not drink alcohol or use illicit drugs.  ROS: UROLOGY Frequent Urination?: No Hard to postpone urination?: No Burning/pain with urination?: No Get up at night to urinate?: No Leakage of urine?: No Urine stream starts and stops?: No Trouble starting stream?: No Do you have to strain to urinate?: No Blood in urine?: No Urinary tract infection?: No Sexually transmitted disease?: No Injury to kidneys or bladder?: No Painful intercourse?: No Weak stream?: No Erection problems?: No Penile pain?: No  Gastrointestinal Nausea?: No Vomiting?: No Indigestion/heartburn?: No Diarrhea?: No Constipation?: No  Constitutional Fever: No Night sweats?: No Weight loss?: No Fatigue?: No  Skin Skin rash/lesions?: No Itching?: No  Eyes Blurred vision?: No Double vision?: No  Ears/Nose/Throat Sore throat?: No Sinus problems?:  Yes  Hematologic/Lymphatic Swollen glands?: No Easy bruising?: No  Cardiovascular Leg swelling?: No Chest pain?: No  Respiratory Cough?: No Shortness of breath?: No  Endocrine Excessive thirst?: No  Musculoskeletal Back pain?: No Joint pain?: No  Neurological Headaches?: No Dizziness?: No  Psychologic Depression?: No Anxiety?: No  Physical Exam: BP 122/78 mmHg  Pulse 65  Ht 5\' 3"  (1.6 m)  Wt 163 lb 11.2 oz (74.254 kg)  BMI 29.01 kg/m2  Constitutional:  Alert and oriented, No acute distress. HEENT: [place] AT, moist mucus membranes.  Trachea midline, no masses. Cardiovascular: No clubbing, cyanosis, or edema. Respiratory: Normal respiratory effort, no increased work of breathing. GI: Abdomen is soft, non tender, non distended, no abdominal masses GU: No CVA tenderness.  Patient with circumcised phallus.   Urethral meatus is patent.  No penile discharge. No penile lesions or rashes. Scrotum without lesions, cysts, rashes and/or edema.  Testicles are located scrotally bilaterally. No masses are appreciated in the testicles. Left and right epididymis are normal. Rectal: Patient with  normal sphincter tone. Perineum without scarring or rashes. No rectal masses are appreciated. Prostate is approximately 45 grams, tender, no nodules are appreciated. Seminal vesicles are normal. Skin: No rashes, bruises or suspicious lesions. Lymph: No cervical or inguinal adenopathy. Neurologic: Grossly intact, no focal deficits, moving all 4 extremities. Psychiatric: Normal mood and affect.  Laboratory Data: Lab Results  Component Value Date   WBC 7.7 09/07/2012   HGB 15.5 09/07/2012   HCT 43.7 09/07/2012   MCV 93 09/07/2012   PLT 289 09/07/2012    Lab Results  Component Value Date   CREATININE 1.20 09/07/2012     Pertinent Imaging: CLINICAL DATA: Acute right testicular pain.  EXAM: ULTRASOUND OF SCROTUM  TECHNIQUE: Complete ultrasound examination of the testicles,  epididymis, and other scrotal structures was performed.  COMPARISON: Ultrasound of January 05, 2015.  FINDINGS: Right testicle  Measurements: 4.1 x 2.7 x 1.7 cm. No mass or microlithiasis visualized.  Left testicle  Measurements: 3.3 x 2.8 x 1.5 cm. No mass or microlithiasis visualized.  Right epididymis: Normal in size and appearance.  Left epididymis: 6 mm cyst is noted and unchanged.  Hydrocele: Mild bilateral hydroceles are noted.  Varicocele: None visualized.  IMPRESSION: Small left epididymal cyst. Mild bilateral hydroceles. No testicular abnormality seen.   Electronically Signed  By: Marijo Conception, M.D.  On: 02/04/2015 18:30  Assessment & Plan:    1. Right scrotal swelling:   Patient describes what may be a communicating hydrocele versus right varicoceles. I have asked the patient to return to the office when his right scrotum is swollen, so that we  may do an exam at that time.     2. BPH (benign prostatic hyperplasia):  Patient was found to have benign enlargement of his prostate on exam. If PSA returns within normal limits, he will return in 1 year for I PSS score, exam and PSA.  - PSA   Return for follow up when his scrotum is swollen.  Zara Council, [facility] [address] [place],  [address] [phone]

## 2015-07-14 LAB — PSA: Prostate Specific Ag, Serum: 0.9 ng/mL (ref 0.0–4.0)

## 2015-07-16 ENCOUNTER — Telehealth: Payer: Self-pay

## 2015-07-16 ENCOUNTER — Encounter: Payer: Self-pay | Admitting: Family Medicine

## 2015-07-16 ENCOUNTER — Ambulatory Visit (INDEPENDENT_AMBULATORY_CARE_PROVIDER_SITE_OTHER): Payer: Medicare Other | Admitting: Family Medicine

## 2015-07-16 VITALS — BP 135/81 | HR 62 | Temp 97.7°F | Ht 62.2 in | Wt 163.2 lb

## 2015-07-16 DIAGNOSIS — F32A Depression, unspecified: Secondary | ICD-10-CM

## 2015-07-16 DIAGNOSIS — F329 Major depressive disorder, single episode, unspecified: Secondary | ICD-10-CM | POA: Diagnosis not present

## 2015-07-16 DIAGNOSIS — G4700 Insomnia, unspecified: Secondary | ICD-10-CM

## 2015-07-16 DIAGNOSIS — E7800 Pure hypercholesterolemia, unspecified: Secondary | ICD-10-CM | POA: Diagnosis not present

## 2015-07-16 DIAGNOSIS — Z13220 Encounter for screening for lipoid disorders: Secondary | ICD-10-CM

## 2015-07-16 DIAGNOSIS — I10 Essential (primary) hypertension: Secondary | ICD-10-CM

## 2015-07-16 DIAGNOSIS — Z0000 Encounter for general adult medical examination without abnormal findings: Secondary | ICD-10-CM | POA: Diagnosis not present

## 2015-07-16 DIAGNOSIS — N433 Hydrocele, unspecified: Secondary | ICD-10-CM | POA: Diagnosis not present

## 2015-07-16 DIAGNOSIS — F209 Schizophrenia, unspecified: Secondary | ICD-10-CM | POA: Diagnosis not present

## 2015-07-16 DIAGNOSIS — N5089 Other specified disorders of the male genital organs: Secondary | ICD-10-CM | POA: Insufficient documentation

## 2015-07-16 DIAGNOSIS — F419 Anxiety disorder, unspecified: Secondary | ICD-10-CM | POA: Diagnosis not present

## 2015-07-16 DIAGNOSIS — B1920 Unspecified viral hepatitis C without hepatic coma: Secondary | ICD-10-CM | POA: Diagnosis not present

## 2015-07-16 DIAGNOSIS — N400 Enlarged prostate without lower urinary tract symptoms: Secondary | ICD-10-CM | POA: Diagnosis not present

## 2015-07-16 DIAGNOSIS — Z113 Encounter for screening for infections with a predominantly sexual mode of transmission: Secondary | ICD-10-CM | POA: Diagnosis not present

## 2015-07-16 DIAGNOSIS — R972 Elevated prostate specific antigen [PSA]: Secondary | ICD-10-CM

## 2015-07-16 LAB — LIPID PANEL PICCOLO, WAIVED
Cholesterol Piccolo, Waived: 248 mg/dL — ABNORMAL HIGH (ref <200)
Triglycerides Piccolo,Waived: 456 mg/dL — ABNORMAL HIGH (ref <150)

## 2015-07-16 NOTE — Assessment & Plan Note (Signed)
Continue to follow with psychiatry. Stable. Continue to monitor.

## 2015-07-16 NOTE — Assessment & Plan Note (Signed)
Rechecking levels today. 

## 2015-07-16 NOTE — Telephone Encounter (Signed)
-----   Message from Nori Riis, PA-C sent at 07/16/2015  8:55 AM EDT ----- Patient's PSA is stable.  We will see him in 12 months.  PSA to be drawn before his next appointment.

## 2015-07-16 NOTE — Assessment & Plan Note (Signed)
Under good control. Continue current regimen. Continue to follow. Recheck 6 months.

## 2015-07-16 NOTE — Assessment & Plan Note (Signed)
Continue to follow with urology. No symptoms at this time.

## 2015-07-16 NOTE — Telephone Encounter (Signed)
Spoke with pt in reference to PSA results. Pt voiced understanding.  

## 2015-07-16 NOTE — Progress Notes (Signed)
BP 135/81 mmHg  Pulse 62  Temp(Src) 97.7 F (36.5 C)  Ht 5' 2.2" (1.58 m)  Wt 163 lb 3.2 oz (74.027 kg)  BMI 29.65 kg/m2  SpO2 95%   Subjective:    Patient ID: Mitchell [name], male    DOB: 1956/01/20, 59 y.o.   MRN: [id number]  HPI: Mitchell [name] is a 59 y.o. male presenting on 07/16/2015 for comprehensive medical examination. Current medical complaints include: None  He saw the urologist on Monday and they told him to come back when his scrotum was swollen. He will do this. At this time scrotum is feeling normal.   He currently lives with: alone Interim Problems from his last visit: no  Depression Screen done today and results listed below:  Depression screen [facility] 2/9 07/16/2015  Decreased Interest 0  Down, Depressed, Hopeless 0  PHQ - 2 Score 0   Past Medical History:  Past Medical History  Diagnosis Date  . Hepatitis C     Tested positive, treated now told that he does not have it  . Anxiety   . Hypertension   . Depression   . IFG (impaired fasting glucose)   . Schizophrenia ([place])   . Insomnia   . Hydrocele   . Epididymitis   . Kidney stone     Surgical History:  Past Surgical History  Procedure Laterality Date  . Ankle fracture surgery    . Colonoscopy  2010  . Hernia repair  2011    X 2    Medications:  Current Outpatient Prescriptions on File Prior to Visit  Medication Sig  . aspirin EC 81 MG tablet Take 1 tablet (81 mg total) by mouth daily.  . clonazePAM (KLONOPIN) 0.5 MG tablet Take 0.5 mg by mouth 3 (three) times daily as needed for anxiety.  . haloperidol (HALDOL) 1 MG tablet   . lisinopril (PRINIVIL,ZESTRIL) 5 MG tablet Take 1 tablet (5 mg total) by mouth daily.  . sildenafil (VIAGRA) 100 MG tablet Take 1 tablet (100 mg total) by mouth daily as needed for erectile dysfunction.  Marland Kitchen zolpidem (AMBIEN) 5 MG tablet Take 10 mg by mouth.   No current facility-administered medications on file prior to visit.    Allergies:  Allergies  Allergen  Reactions  . Codeine Nausea And Vomiting    Social History:  Social History   Social History  . Marital Status: Single    Spouse Name: N/A  . Number of Children: N/A  . Years of Education: N/A   Occupational History  . Not on file.   Social History Main Topics  . Smoking status: Former Smoker    Quit date: 07/06/1995  . Smokeless tobacco: Former Systems developer  . Alcohol Use: No     Comment: remote history, quit 20+ years ago  . Drug Use: No     Comment: remote history, quit 20= years ago  . Sexual Activity: Yes   Other Topics Concern  . Not on file   Social History Narrative   History  Smoking status  . Former Smoker  . Quit date: 07/06/1995  Smokeless tobacco  . Former Systems developer   History  Alcohol Use No    Comment: remote history, quit 20+ years ago    Family History:  Family History  Problem Relation Age of Onset  . Diabetes Mother   . Alzheimer's disease Father   . Diabetes Brother   . Diabetes Maternal Grandfather     Past medical history, surgical history, medications,  allergies, family history and social history reviewed with patient today and changes made to appropriate areas of the chart.   Review of Systems  Constitutional: Negative.   HENT: Negative.        Thinks his ears need to be washed out  Eyes: Negative.   Respiratory: Negative.   Cardiovascular: Negative.   Gastrointestinal: Negative.   Genitourinary: Negative.   Musculoskeletal: Negative.   Skin: Negative.   Neurological: Negative.   Endo/Heme/Allergies: Negative.   Psychiatric/Behavioral: Negative.     All other ROS negative except what is listed above and in the HPI.      Objective:    BP 135/81 mmHg  Pulse 62  Temp(Src) 97.7 F (36.5 C)  Ht 5' 2.2" (1.58 m)  Wt 163 lb 3.2 oz (74.027 kg)  BMI 29.65 kg/m2  SpO2 95%  Wt Readings from Last 3 Encounters:  07/16/15 163 lb 3.2 oz (74.027 kg)  07/13/15 163 lb 11.2 oz (74.254 kg)  07/08/15 162 lb (73.483 kg)    Physical Exam   Constitutional: He is oriented to person, place, and time. He appears well-developed and well-nourished. No distress.  HENT:  Head: Normocephalic and atraumatic.  Right Ear: Hearing and external ear normal.  Left Ear: Hearing and external ear normal.  Nose: Nose normal.  Mouth/Throat: Oropharynx is clear and moist. No oropharyngeal exudate.  Eyes: Conjunctivae, EOM and lids are normal. Pupils are equal, round, and reactive to light. Right eye exhibits no discharge. Left eye exhibits no discharge. No scleral icterus.  Neck: Normal range of motion. Neck supple. No JVD present. No tracheal deviation present. No thyromegaly present.  Cardiovascular: Normal rate, regular rhythm, normal heart sounds and intact distal pulses.  Exam reveals no gallop and no friction rub.   No murmur heard. Pulmonary/Chest: Effort normal. No stridor. No respiratory distress. He has no wheezes. He has no rales. He exhibits no tenderness.  Abdominal: Soft. Bowel sounds are normal. He exhibits no distension and no mass. There is no tenderness. There is no rebound and no guarding.  Genitourinary:  Deferred, saw urologist on Monday.  Musculoskeletal: Normal range of motion. He exhibits no edema or tenderness.  Lymphadenopathy:    He has no cervical adenopathy.  Neurological: He is alert and oriented to person, place, and time. He has normal reflexes. He displays normal reflexes. No cranial nerve deficit. He exhibits normal muscle tone. Coordination normal.  Skin: Skin is warm, dry and intact. No rash noted. He is not diaphoretic. No erythema. No pallor.  Psychiatric: He has a normal mood and affect. His speech is normal and behavior is normal. Judgment and thought content normal. Cognition and memory are normal.  Nursing note and vitals reviewed.   Results for orders placed or performed in visit on 07/13/15  PSA  Result Value Ref Range   Prostate Specific Ag, Serum 0.9 0.0 - 4.0 ng/mL      Assessment & Plan:    Problem List Items Addressed This Visit      Cardiovascular and Mediastinum   Hypertension    Under good control. Continue current regimen. Continue to follow. Recheck 6 months.         Digestive   Hepatitis C    Rechecking levels today.         Genitourinary   BPH (benign prostatic hyperplasia)    Continue to follow with urology. No symptoms at this time.         Other   Anxiety  Continue to follow with psychiatry. Stable. Continue to monitor.       Depression    Continue to follow with psychiatry. Stable. Continue to monitor.       Schizophrenia ([place])    Continue to follow with psychiatry. Stable. Continue to monitor.       Insomnia    Continue to follow with psychiatry. Stable. Continue to monitor.        Other Visit Diagnoses    Routine general medical examination at a health care facility    -  Primary    Doing well. Refused pneumonia, but otherwise up to date on vaccines. Colonoscopy scheduled. Screening labs today. Work on diet and exercise.     Routine screening for STI (sexually transmitted infection)        To be done next visit.     Screening for cholesterol level        Drawn today.    Hydrocele, unspecified hydrocele type           LABORATORY TESTING:  Health maintenance labs ordered today as discussed above.   IMMUNIZATIONS:   - Tdap: Tetanus vaccination status reviewed: last tetanus booster within 10 years. - Influenza: Up to date - Pneumovax: Refused - Prevnar: Not applicable - Zostavax vaccine: Up to date  SCREENING: - Colonoscopy: To be done December 5th  Discussed with patient purpose of the colonoscopy is to detect colon cancer at curable precancerous or early stages    PATIENT COUNSELING:    Sexuality: Discussed sexually transmitted diseases, partner selection, use of condoms, avoidance of unintended pregnancy  and contraceptive alternatives.   Advised to avoid cigarette smoking.  I discussed with the patient that most  people either abstain from alcohol or drink within safe limits (<=14/week and <=4 drinks/occasion for males, <=7/weeks and <= 3 drinks/occasion for females) and that the risk for alcohol disorders and other health effects rises proportionally with the number of drinks per week and how often a drinker exceeds daily limits.  Discussed cessation/primary prevention of drug use and availability of treatment for abuse.   Diet: Encouraged to adjust caloric intake to maintain  or achieve ideal body weight, to reduce intake of dietary saturated fat and total fat, to limit sodium intake by avoiding high sodium foods and not adding table salt, and to maintain adequate dietary potassium and calcium preferably from fresh fruits, vegetables, and low-fat dairy products.    stressed the importance of regular exercise  Injury prevention: Discussed safety belts, safety helmets, smoke detector, smoking near bedding or upholstery.   Dental health: Discussed importance of regular tooth brushing, flossing, and dental visits.   Follow up plan: NEXT PREVENTATIVE PHYSICAL DUE IN 1 YEAR. Return in about 6 months (around 01/14/2016) for Follow up on BP.

## 2015-07-16 NOTE — Patient Instructions (Signed)
Benign Prostatic Hypertrophy The prostate gland is part of the reproductive system of men. A normal prostate is about the size and shape of a walnut. The prostate gland produces a fluid that is mixed with sperm to make semen. This gland surrounds the urethra and is located in front of the rectum and just below the bladder. The bladder is where urine is stored. The urethra is the tube through which urine passes from the bladder to get out of the body. The prostate grows as a man ages. An enlarged prostate not caused by cancer is called benign prostatic hypertrophy (BPH). An enlarged prostate can press on the urethra. This can make it harder to pass urine. In the early stages of enlargement, the bladder can get by with a narrowed urethra by forcing the urine through. If the problem gets worse, medical or surgical treatment may be required.  This condition should be followed by your health care provider. The accumulation of urine in the bladder can cause infection. Back pressure and infection can progress to bladder damage and kidney (renal) failure. If needed, your health care provider may refer you to a specialist in kidney and prostate disease (urologist). CAUSES  BPH is a common health problem in men older than 50 years. This condition is a normal part of aging. However, not all men will develop problems from this condition. If the enlargement grows away from the urethra, then there will not be any compression of the urethra and resistance to urine flow.If the growth is toward the urethra and compresses it, you will experience difficulty urinating.  SYMPTOMS   Not able to completely empty your bladder.  Getting up often during the night to urinate.  Need to urinate frequently during the day.  Difficultly starting urine flow.  Decrease in size and strength of your urine stream.  Dribbling after urination.  Pain on urination (more common with infection).  Inability to pass urine. This needs  immediate treatment.  The development of a urinary tract infection. DIAGNOSIS  These tests will help your health care provider understand your problem:  A thorough history and physical examination.  A urination history, with the number of times you urinate, the amounts of urine, the strength of the urine stream, and the feeling of emptiness or fullness after urinating.  A postvoid bladder scan that measures any amount of urine that may remain in your bladder after you finish urinating.  Digital rectal exam. In a rectal exam, your health care provider checks your prostate by putting a gloved, lubricated finger into your rectum to feel the back of your prostate gland. This exam detects the size of your gland and abnormal lumps or growths.  Exam of your urine (urinalysis).  Prostate specific antigen (PSA) screening. This is a blood test used to screen for prostate cancer.  Rectal ultrasonography. This test uses sound waves to electronically produce a picture of your prostate gland. TREATMENT  Once symptoms begin, your health care provider will monitor your condition. Of the men with this condition, one third will have symptoms that stabilize, one third will have symptoms that improve, and one third will have symptoms that progress in the first year. Mild symptoms may not need treatment. Simple observation and yearly exams may be all that is required. Medicines and surgery are options for more severe problems. Your health care provider can help you make an informed decision for what is best. Two classes of medicines are available for relief of prostate symptoms:  Medicines  that shrink the prostate. This helps relieve symptoms. These medicines take time to work, and it may be months before any improvement is seen.  Uncommon side effects include problems with sexual function.  Medicines to relax the muscle of the prostate. This also relieves the obstruction by reducing any compression on the  urethra.This group of medicines work much faster than those that reduce the size of the prostate gland. Usually, one can experience improvement in days to weeks..  Side effects can include dizziness, fatigue, lightheadedness, and retrograde ejaculation (diminished volume of ejaculate). Several types of surgical treatments are available for relief of prostate symptoms:  Transurethral resection of the prostate (TURP)--In this treatment, an instrument is inserted through opening at the tip of the penis. It is used to cut away pieces of the inner core of the prostate. The pieces are removed through the same opening of the penis. This removes the obstruction and helps get rid of the symptoms.  Transurethral incision (TUIP)--In this procedure, small cuts are made in the prostate. This lessens the prostates pressure on the urethra.  Transurethral microwave thermotherapy (TUMT)--This procedure uses microwaves to create heat. The heat destroys and removes a small amount of prostate tissue.  Transurethral needle ablation (TUNA)--This is a procedure that uses radio frequencies to do the same as TUMT.  Interstitial laser coagulation (ILC)--This is a procedure that uses a laser to do the same as TUMT and TUNA.  Transurethral electrovaporization (TUVP)--This is a procedure that uses electrodes to do the same as the procedures listed above. SEEK MEDICAL CARE IF:   You develop a fever.  There is unexplained back pain.  Symptoms are not helped by medicines prescribed.  You develop side effects from the medicine you are taking.  Your urine becomes very dark or has a bad smell.  Your lower abdomen becomes distended and you have difficulty passing your urine. SEEK IMMEDIATE MEDICAL CARE IF:   You are suddenly unable to urinate. This is an emergency. You should be seen immediately.  There are large amounts of blood or clots in the urine.  Your urinary problems become unmanageable.  You develop  lightheadedness, severe dizziness, or you feel faint.  You develop moderate to severe low back or flank pain.  You develop chills or fever.   This information is not intended to replace advice given to you by your health care provider. Make sure you discuss any questions you have with your health care provider.   Document Released: 09/05/2005 Document Revised: 09/10/2013 Document Reviewed: 03/21/2013 Elsevier Interactive Patient Education 2016 [place] Maintenance, Male A healthy lifestyle and preventative care can promote health and wellness.  Maintain regular health, dental, and eye exams.  Eat a healthy diet. Foods like vegetables, fruits, whole grains, low-fat dairy products, and lean protein foods contain the nutrients you need and are low in calories. Decrease your intake of foods high in solid fats, added sugars, and salt. Get information about a proper diet from your health care provider, if necessary.  Regular physical exercise is one of the most important things you can do for your health. Most adults should get at least 150 minutes of moderate-intensity exercise (any activity that increases your heart rate and causes you to sweat) each week. In addition, most adults need muscle-strengthening exercises on 2 or more days a week.   Maintain a healthy weight. The body mass index (BMI) is a screening tool to identify possible weight problems. It provides an estimate of body fat based  on height and weight. Your health care provider can find your BMI and can help you achieve or maintain a healthy weight. For males 20 years and older:  A BMI below 18.5 is considered underweight.  A BMI of 18.5 to 24.9 is normal.  A BMI of 25 to 29.9 is considered overweight.  A BMI of 30 and above is considered obese.  Maintain normal blood lipids and cholesterol by exercising and minimizing your intake of saturated fat. Eat a balanced diet with plenty of fruits and vegetables. Blood  tests for lipids and cholesterol should begin at age 74 and be repeated every 5 years. If your lipid or cholesterol levels are high, you are over age 59, or you are at high risk for heart disease, you may need your cholesterol levels checked more frequently.Ongoing high lipid and cholesterol levels should be treated with medicines if diet and exercise are not working.  If you smoke, find out from your health care provider how to quit. If you do not use tobacco, do not start.  Lung cancer screening is recommended for adults aged 86-80 years who are at high risk for developing lung cancer because of a history of smoking. A yearly low-dose CT scan of the lungs is recommended for people who have at least a 30-pack-year history of smoking and are current smokers or have quit within the past 15 years. A pack year of smoking is smoking an average of 1 pack of cigarettes a day for 1 year (for example, a 30-pack-year history of smoking could mean smoking 1 pack a day for 30 years or 2 packs a day for 15 years). Yearly screening should continue until the smoker has stopped smoking for at least 15 years. Yearly screening should be stopped for people who develop a health problem that would prevent them from having lung cancer treatment.  If you choose to drink alcohol, do not have more than 2 drinks per day. One drink is considered to be 12 oz (360 mL) of beer, 5 oz (150 mL) of wine, or 1.5 oz (45 mL) of liquor.  Avoid the use of street drugs. Do not share needles with anyone. Ask for help if you need support or instructions about stopping the use of drugs.  High blood pressure causes heart disease and increases the risk of stroke. High blood pressure is more likely to develop in:  People who have blood pressure in the end of the normal range (100-139/85-89 mm Hg).  People who are overweight or obese.  People who are African American.  If you are 59-61 years of age, have your blood pressure checked every 3-5  years. If you are 35 years of age or older, have your blood pressure checked every year. You should have your blood pressure measured twice--once when you are at a hospital or clinic, and once when you are not at a hospital or clinic. Record the average of the two measurements. To check your blood pressure when you are not at a hospital or clinic, you can use:  An automated blood pressure machine at a pharmacy.  A home blood pressure monitor.  If you are 16-77 years old, ask your health care provider if you should take aspirin to prevent heart disease.  Diabetes screening involves taking a blood sample to check your fasting blood sugar level. This should be done once every 3 years after age 62 if you are at a normal weight and without risk factors for diabetes. Testing should  be considered at a younger age or be carried out more frequently if you are overweight and have at least 1 risk factor for diabetes.  Colorectal cancer can be detected and often prevented. Most routine colorectal cancer screening begins at the age of 74 and continues through age 32. However, your health care provider may recommend screening at an earlier age if you have risk factors for colon cancer. On a yearly basis, your health care provider may provide home test kits to check for hidden blood in the stool. A small camera at the end of a tube may be used to directly examine the colon (sigmoidoscopy or colonoscopy) to detect the earliest forms of colorectal cancer. Talk to your health care provider about this at age 55 when routine screening begins. A direct exam of the colon should be repeated every 5-10 years through age 76, unless early forms of precancerous polyps or small growths are found.  People who are at an increased risk for hepatitis B should be screened for this virus. You are considered at high risk for hepatitis B if:  You were born in a country where hepatitis B occurs often. Talk with your health care provider  about which countries are considered high risk.  Your parents were born in a high-risk country and you have not received a shot to protect against hepatitis B (hepatitis B vaccine).  You have HIV or AIDS.  You use needles to inject street drugs.  You live with, or have sex with, someone who has hepatitis B.  You are a man who has sex with other men (MSM).  You get hemodialysis treatment.  You take certain medicines for conditions like cancer, organ transplantation, and autoimmune conditions.  Hepatitis C blood testing is recommended for all people born from [birth year] through 1966 and any individual with known risk factors for hepatitis C.  Healthy men should no longer receive prostate-specific antigen (PSA) blood tests as part of routine cancer screening. Talk to your health care provider about prostate cancer screening.  Testicular cancer screening is not recommended for adolescents or adult males who have no symptoms. Screening includes self-exam, a health care provider exam, and other screening tests. Consult with your health care provider about any symptoms you have or any concerns you have about testicular cancer.  Practice safe sex. Use condoms and avoid high-risk sexual practices to reduce the spread of sexually transmitted infections (STIs).  You should be screened for STIs, including gonorrhea and chlamydia if:  You are sexually active and are younger than 24 years.  You are older than 24 years, and your health care provider tells you that you are at risk for this type of infection.  Your sexual activity has changed since you were last screened, and you are at an increased risk for chlamydia or gonorrhea. Ask your health care provider if you are at risk.  If you are at risk of being infected with HIV, it is recommended that you take a prescription medicine daily to prevent HIV infection. This is called pre-exposure prophylaxis (PrEP). You are considered at risk if:  You are a  man who has sex with other men (MSM).  You are a heterosexual man who is sexually active with multiple partners.  You take drugs by injection.  You are sexually active with a partner who has HIV.  Talk with your health care provider about whether you are at high risk of being infected with HIV. If you choose to begin PrEP, you  should first be tested for HIV. You should then be tested every 3 months for as long as you are taking PrEP.  Use sunscreen. Apply sunscreen liberally and repeatedly throughout the day. You should seek shade when your shadow is shorter than you. Protect yourself by wearing long sleeves, pants, a wide-brimmed hat, and sunglasses year round whenever you are outdoors.  Tell your health care provider of new moles or changes in moles, especially if there is a change in shape or color. Also, tell your health care provider if a mole is larger than the size of a pencil eraser.  A one-time screening for abdominal aortic aneurysm (AAA) and surgical repair of large AAAs by ultrasound is recommended for men aged 83-75 years who are current or former smokers.  Stay current with your vaccines (immunizations).   This information is not intended to replace advice given to you by your health care provider. Make sure you discuss any questions you have with your health care provider.   Document Released: 03/03/2008 Document Revised: 09/26/2014 Document Reviewed: 01/31/2011 Elsevier Interactive Patient Education [employer].

## 2015-07-17 ENCOUNTER — Telehealth: Payer: Self-pay | Admitting: Family Medicine

## 2015-07-17 DIAGNOSIS — E039 Hypothyroidism, unspecified: Secondary | ICD-10-CM | POA: Insufficient documentation

## 2015-07-17 LAB — COMPREHENSIVE METABOLIC PANEL
ALT: 15 IU/L (ref 0–44)
AST: 23 IU/L (ref 0–40)
Albumin/Globulin Ratio: 1.2 (ref 1.1–2.5)
Albumin: 4.1 g/dL (ref 3.6–4.8)
Alkaline Phosphatase: 51 IU/L (ref 39–117)
BUN/Creatinine Ratio: 15 (ref 10–22)
BUN: 17 mg/dL (ref 8–27)
Bilirubin Total: 0.4 mg/dL (ref 0.0–1.2)
CALCIUM: 9.6 mg/dL (ref 8.6–10.2)
CHLORIDE: 98 mmol/L (ref 97–106)
CO2: 25 mmol/L (ref 18–29)
Creatinine, Ser: 1.14 mg/dL (ref 0.76–1.27)
GFR calc Af Amer: 80 mL/min/{1.73_m2} (ref >59)
GFR, EST NON AFRICAN AMERICAN: 70 mL/min/{1.73_m2} (ref >59)
GLUCOSE: 93 mg/dL (ref 65–99)
Globulin, Total: 3.4 g/dL (ref 1.5–4.5)
Potassium: 5.3 mmol/L — ABNORMAL HIGH (ref 3.5–5.2)
Sodium: 140 mmol/L (ref 136–144)
Total Protein: 7.5 g/dL (ref 6.0–8.5)

## 2015-07-17 LAB — TSH: TSH: 9 u[IU]/mL — AB (ref 0.450–4.500)

## 2015-07-17 LAB — HEPATITIS C VRS RNA DETECT BY PCR-QUAL

## 2015-07-17 MED ORDER — LEVOTHYROXINE SODIUM 25 MCG PO TABS
25.0000 ug | ORAL_TABLET | Freq: Every day | ORAL | Status: DC
Start: 2015-07-17 — End: 2015-08-28

## 2015-07-17 NOTE — Telephone Encounter (Signed)
Patient notified

## 2015-07-17 NOTE — Telephone Encounter (Signed)
Called to give Mitchell [name] his results LMOM. His thyroid is low. Will send through 25mg  of synthroid and recheck in 6 weeks. Everything else so far looks good- still waiting on the Hep C which should be back on Monday.

## 2015-07-20 ENCOUNTER — Telehealth: Payer: Self-pay | Admitting: Family Medicine

## 2015-07-20 LAB — HEPATITIS C VRS RNA DETECT BY PCR-QUAL: HCV RNA NAA Qualitative: NEGATIVE

## 2015-07-20 NOTE — Telephone Encounter (Signed)
Called to let patient know that hep c came back negative for any RNA- treatment did what it was supposed to do.

## 2015-07-21 LAB — UA/M W/RFLX CULTURE, ROUTINE
Bilirubin, UA: NEGATIVE
Glucose, UA: NEGATIVE
Ketones, UA: NEGATIVE
LEUKOCYTES UA: NEGATIVE
NITRITE UA: NEGATIVE
Protein, UA: NEGATIVE
RBC UA: NEGATIVE
SPEC GRAV UA: 1.015 (ref 1.005–1.030)
UUROB: 0.2 mg/dL (ref 0.2–1.0)
pH, UA: 7 (ref 5.0–7.5)

## 2015-07-21 LAB — CBC WITH DIFFERENTIAL/PLATELET
HEMATOCRIT: 44 % (ref 37.5–51.0)
Hemoglobin: 15.7 g/dL (ref 12.6–17.7)
Lymphocytes Absolute: 3.4 10*3/uL — ABNORMAL HIGH (ref 0.7–3.1)
Lymphs: 42 %
MCH: 33.3 pg — ABNORMAL HIGH (ref 26.6–33.0)
MCHC: 35.7 g/dL (ref 31.5–35.7)
MCV: 93 fL (ref 79–97)
MID (ABSOLUTE): 1 10*3/uL (ref 0.1–1.6)
MID: 13 %
Neutrophils Absolute: 3.6 10*3/uL (ref 1.4–7.0)
Neutrophils: 45 %
Platelets: 307 10*3/uL (ref 150–379)
RBC: 4.72 x10E6/uL (ref 4.14–5.80)
RDW: 46.2 % — ABNORMAL HIGH (ref 12.3–15.4)
WBC: 8 10*3/uL (ref 3.4–10.8)

## 2015-07-21 LAB — MICROALBUMIN, URINE WAIVED
Creatinine, Urine Waived: 100 mg/dL (ref 10–300)
Microalb, Ur Waived: 10 mg/L (ref 0–19)

## 2015-07-22 ENCOUNTER — Telehealth: Payer: Self-pay | Admitting: Family Medicine

## 2015-07-22 DIAGNOSIS — E785 Hyperlipidemia, unspecified: Secondary | ICD-10-CM

## 2015-07-22 LAB — LIPID PANEL W/O CHOL/HDL RATIO
Cholesterol, Total: 260 mg/dL — ABNORMAL HIGH (ref 100–199)
HDL: 33 mg/dL — ABNORMAL LOW (ref >39)
Triglycerides: 438 mg/dL — ABNORMAL HIGH (ref 0–149)

## 2015-07-22 LAB — SPECIMEN STATUS REPORT

## 2015-07-22 MED ORDER — ATORVASTATIN CALCIUM 40 MG PO TABS: 40.0000 mg | ORAL_TABLET | Freq: Every day | ORAL | Status: DC

## 2015-07-22 NOTE — Telephone Encounter (Signed)
Cholesterol very high. Will start atorvastatin. Recheck at follow up. He is OK with this. Rx sent to [facility]

## 2015-07-23 ENCOUNTER — Telehealth: Payer: Self-pay | Admitting: Family Medicine

## 2015-07-23 DIAGNOSIS — E785 Hyperlipidemia, unspecified: Secondary | ICD-10-CM

## 2015-07-23 NOTE — Telephone Encounter (Signed)
Pt called would like a call back from [facility] in regards to pt's medication. This is all the information that was provided. Thanks.

## 2015-07-23 NOTE — Telephone Encounter (Signed)
Patient is concerned about Lipitor being  bad on his Liver, he states that he just finished treatment for Hepatitis, so he does not want to damage it again. He would like to discuss this with you. I informed him that if you thought that it was going to harm him you would not have given it to him.

## 2015-07-23 NOTE — Telephone Encounter (Signed)
Called and reassured patient. Will return in 5 weeks for recheck on thyroid and cholesterol

## 2015-08-10 ENCOUNTER — Encounter: Payer: Self-pay | Admitting: Urology

## 2015-08-10 ENCOUNTER — Ambulatory Visit (INDEPENDENT_AMBULATORY_CARE_PROVIDER_SITE_OTHER): Payer: Medicare Other | Admitting: Urology

## 2015-08-10 VITALS — BP 120/76 | HR 70 | Ht 63.0 in | Wt 167.0 lb

## 2015-08-10 DIAGNOSIS — N5089 Other specified disorders of the male genital organs: Secondary | ICD-10-CM

## 2015-08-10 DIAGNOSIS — N401 Enlarged prostate with lower urinary tract symptoms: Secondary | ICD-10-CM | POA: Diagnosis not present

## 2015-08-10 DIAGNOSIS — N138 Other obstructive and reflux uropathy: Secondary | ICD-10-CM

## 2015-08-10 LAB — MICROSCOPIC EXAMINATION
BACTERIA UA: NONE SEEN
EPITHELIAL CELLS (NON RENAL): NONE SEEN /HPF (ref 0–10)
RBC, UA: NONE SEEN /hpf (ref 0–?)
WBC UA: NONE SEEN /HPF (ref 0–?)

## 2015-08-10 LAB — URINALYSIS, COMPLETE
Bilirubin, UA: NEGATIVE
Glucose, UA: NEGATIVE
KETONES UA: NEGATIVE
LEUKOCYTES UA: NEGATIVE
NITRITE UA: NEGATIVE
PROTEIN UA: NEGATIVE
RBC UA: NEGATIVE
Specific Gravity, UA: 1.025 (ref 1.005–1.030)
Urobilinogen, Ur: 0.2 mg/dL (ref 0.2–1.0)
pH, UA: 6 (ref 5.0–7.5)

## 2015-08-10 NOTE — Progress Notes (Signed)
8:21 PM   Mitchell [name] 1956-01-09 [id number]  Referring provider: Valerie Roys, DO [place], [place] [address]  Chief Complaint  Patient presents with  . Groin Swelling    Testicle Swelling follow up Right    HPI: Patient presents today stating his right scrotum is now swollen.  He stated it started swelling this afternoon.  As instructed, he presented to the office while the swelling was occuring for examination.  Background history Patient is a 59 year old white male who complains of a right scrotal swelling that comes and goes over the last four months.  He is referred to [place] by his primary care physician, Dr. [name] for further evaluation and management.  Patient states approximately 3 months ago he noticed his right scrotum was very swollen and tender. He sought treatment in the emergency room and was given antibiotic.  A scrotal ultrasound did not note any sign of torsion or epididymitis. He was noted to have small bilateral hydroceles and a small left epididymal cyst.  Patient stated after the antibiotic the swelling didn't abate. It then returned 3 weeks later and he was given another antibiotic. It then recurred again.  He states the swelling does become quite large. He states he can get his large as an orange. He states the swelling even is up into his right groin area.  He states he notices it more frequently after his girlfriend performs vigorous oral sex on him.    He does not have any urinary symptoms or erection problems.  He is not having a penile discharge, dysuria or suprapubic pain.  His exam was not impressive today.   I reviewed his images and noted the bilateral hydroceles and the small left epididymal cyst.    PMH: Past Medical History  Diagnosis Date  . Hepatitis C     Tested positive, treated now told that he does not have it  . Anxiety   . Hypertension   . Depression   . IFG (impaired fasting glucose)   . Schizophrenia ([place])   . Insomnia   .  Hydrocele   . Epididymitis   . Kidney stone     Surgical History: Past Surgical History  Procedure Laterality Date  . Ankle fracture surgery    . Colonoscopy  2010  . Hernia repair  2011    X 2    Home Medications:    Medication List       This list is accurate as of: 08/10/15 11:59 PM.  Always use your most recent med list.               AMBIEN 5 MG tablet  Generic drug:  zolpidem  Take 10 mg by mouth.     aspirin EC 81 MG tablet  Take 1 tablet (81 mg total) by mouth daily.     atorvastatin 40 MG tablet  Commonly known as:  LIPITOR  Take 1 tablet (40 mg total) by mouth daily.     cetirizine 5 MG tablet  Commonly known as:  ZYRTEC  Take 5 mg by mouth daily.     clonazePAM 0.5 MG tablet  Commonly known as:  KLONOPIN  Take 0.5 mg by mouth 3 (three) times daily as needed for anxiety.     haloperidol 1 MG tablet  Commonly known as:  HALDOL     levothyroxine 25 MCG tablet  Commonly known as:  LEVOTHROID  Take 1 tablet (25 mcg total) by  mouth daily before breakfast. Return in 6 weeks for blood test     lisinopril 5 MG tablet  Commonly known as:  PRINIVIL,ZESTRIL  Take 1 tablet (5 mg total) by mouth daily.     sildenafil 100 MG tablet  Commonly known as:  VIAGRA  Take 1 tablet (100 mg total) by mouth daily as needed for erectile dysfunction.        Allergies:  Allergies  Allergen Reactions  . Codeine Nausea And Vomiting    Family History: Family History  Problem Relation Age of Onset  . Diabetes Mother   . Alzheimer's disease Father   . Diabetes Brother   . Diabetes Maternal Grandfather     Social History:  reports that he quit smoking about 20 years ago. He has quit using smokeless tobacco. He reports that he does not drink alcohol or use illicit drugs.  ROS: UROLOGY Frequent Urination?: No Hard to postpone urination?: No Burning/pain with urination?: No Get up at night to urinate?: No Leakage of urine?: No Urine stream starts and  stops?: No Trouble starting stream?: No Do you have to strain to urinate?: No Blood in urine?: No Urinary tract infection?: No Sexually transmitted disease?: No Injury to kidneys or bladder?: No Painful intercourse?: No Weak stream?: No Erection problems?: No Penile pain?: No  Gastrointestinal Nausea?: No Vomiting?: No Indigestion/heartburn?: No Diarrhea?: No Constipation?: No  Constitutional Fever: No Night sweats?: No Weight loss?: No Fatigue?: No  Skin Skin rash/lesions?: No Itching?: No  Eyes Blurred vision?: No Double vision?: No  Ears/Nose/Throat Sore throat?: No Sinus problems?: No  Hematologic/Lymphatic Swollen glands?: No Easy bruising?: No  Cardiovascular Leg swelling?: No Chest pain?: No  Respiratory Cough?: No Shortness of breath?: No  Endocrine Excessive thirst?: No  Musculoskeletal Back pain?: No Joint pain?: No  Neurological Headaches?: No Dizziness?: No  Psychologic Depression?: No Anxiety?: No  Physical Exam: BP 120/76 mmHg  Pulse 70  Ht 5\' 3"  (1.6 m)  Wt 167 lb (75.751 kg)  BMI 29.59 kg/m2  Constitutional:  Alert and oriented, No acute distress. HEENT: [place] AT, moist mucus membranes.  Trachea midline, no masses. Cardiovascular: No clubbing, cyanosis, or edema. Respiratory: Normal respiratory effort, no increased work of breathing. GI: Abdomen is soft, non tender, non distended, no abdominal masses GU: No CVA tenderness.  Patient with circumcised phallus.   Urethral meatus is patent.  No penile discharge. No penile lesions or rashes. Scrotum without lesions, cysts, rashes and/or edema.  Testicles are located scrotally bilaterally. No masses are appreciated in the testicles. Left and right epididymis are normal.  I could not appreciate the swelling the patient was describing to me.   Rectal: Patient with  normal sphincter tone. Perineum without scarring or rashes. No rectal masses are appreciated. Prostate is approximately 45  grams, tender, no nodules are appreciated. Seminal vesicles are normal. Skin: No rashes, bruises or suspicious lesions. Lymph: No cervical or inguinal adenopathy. Neurologic: Grossly intact, no focal deficits, moving all 4 extremities. Psychiatric: Normal mood and affect.  Laboratory Data: Lab Results  Component Value Date   WBC 8.0 07/16/2015   HGB 15.5 09/07/2012   HCT 44.0 07/16/2015   MCV 93 09/07/2012   PLT 289 09/07/2012    Lab Results  Component Value Date   CREATININE 1.14 07/16/2015     Pertinent Imaging: CLINICAL DATA: Acute right testicular pain.  EXAM: ULTRASOUND OF SCROTUM  TECHNIQUE: Complete ultrasound examination of the testicles, epididymis, and other scrotal structures was performed.  COMPARISON:  Ultrasound of January 05, 2015.  FINDINGS: Right testicle  Measurements: 4.1 x 2.7 x 1.7 cm. No mass or microlithiasis visualized.  Left testicle  Measurements: 3.3 x 2.8 x 1.5 cm. No mass or microlithiasis visualized.  Right epididymis: Normal in size and appearance.  Left epididymis: 6 mm cyst is noted and unchanged.  Hydrocele: Mild bilateral hydroceles are noted.  Varicocele: None visualized.  IMPRESSION: Small left epididymal cyst. Mild bilateral hydroceles. No testicular abnormality seen.   Electronically Signed  By: Marijo Conception, M.D.  On: 02/04/2015 18:30  Assessment & Plan:    1. Right scrotal swelling:   Patient's exam was not impressive for scrotal swelling.  The patient insisted that his scrotum was really swollen, but the exam was normal.  I reassured the patient that his hydroceles were small and to treat them conservatively.  He will follow up in one year.     2. BPH (benign prostatic hyperplasia):  Patient was found to have benign enlargement of his prostate on exam. If PSA returns within normal limits, he will return in 1 year for I PSS score, exam and PSA.  - PSA  Patient took a call on his cell  phone during the office visit and would not end the conversation when asked.  I then left the room and my staff explained to him to return to the clinic if he should notice significant enlargement of his scrotum.    Return in about 1 year (around 08/09/2016) for IPSS score and exam.  Zara Council, [facility] [address] [place], [place] [address] [phone]

## 2015-08-11 LAB — GC/CHLAMYDIA PROBE AMP
CHLAMYDIA, DNA PROBE: NEGATIVE
NEISSERIA GONORRHOEAE BY PCR: NEGATIVE

## 2015-08-14 DIAGNOSIS — N5089 Other specified disorders of the male genital organs: Secondary | ICD-10-CM | POA: Insufficient documentation

## 2015-08-14 DIAGNOSIS — N401 Enlarged prostate with lower urinary tract symptoms: Secondary | ICD-10-CM

## 2015-08-14 DIAGNOSIS — N138 Other obstructive and reflux uropathy: Secondary | ICD-10-CM | POA: Insufficient documentation

## 2015-08-27 ENCOUNTER — Other Ambulatory Visit: Payer: Self-pay | Admitting: Family Medicine

## 2015-08-27 ENCOUNTER — Encounter: Payer: Self-pay | Admitting: Family Medicine

## 2015-08-27 ENCOUNTER — Ambulatory Visit (INDEPENDENT_AMBULATORY_CARE_PROVIDER_SITE_OTHER): Payer: Medicare Other | Admitting: Family Medicine

## 2015-08-27 VITALS — BP 142/88 | HR 69 | Temp 97.9°F | Ht 63.0 in | Wt 163.0 lb

## 2015-08-27 DIAGNOSIS — E785 Hyperlipidemia, unspecified: Secondary | ICD-10-CM

## 2015-08-27 DIAGNOSIS — E039 Hypothyroidism, unspecified: Secondary | ICD-10-CM

## 2015-08-27 DIAGNOSIS — Z7689 Persons encountering health services in other specified circumstances: Secondary | ICD-10-CM | POA: Diagnosis not present

## 2015-08-27 DIAGNOSIS — E038 Other specified hypothyroidism: Secondary | ICD-10-CM | POA: Diagnosis not present

## 2015-08-27 LAB — LP+ALT+AST PICCOLO, WAIVED
ALT (SGPT) Piccolo, Waived: 26 U/L (ref 10–47)
AST (SGOT) Piccolo, Waived: 35 U/L (ref 11–38)
CHOL/HDL RATIO PICCOLO,WAIVE: 4 mg/dL
CHOLESTEROL PICCOLO, WAIVED: 160 mg/dL (ref <200)
HDL Chol Piccolo, Waived: 41 mg/dL — ABNORMAL LOW (ref >59)
LDL CHOL CALC PICCOLO WAIVED: 56 mg/dL (ref <100)
Triglycerides Piccolo,Waived: 321 mg/dL — ABNORMAL HIGH (ref <150)
VLDL CHOL CALC PICCOLO,WAIVE: 64 mg/dL — AB (ref <30)

## 2015-08-27 MED ORDER — ROSUVASTATIN CALCIUM 10 MG PO TABS: 10.0000 mg | ORAL_TABLET | Freq: Every day | ORAL | Status: DC

## 2015-08-27 NOTE — Assessment & Plan Note (Signed)
Could not tolerate the lipitor. Severe myalgias. Will start him on crestor 10. If having myalgias, encouraged him to call so we can change him to zetia. Recheck in 6 weeks with thyroid.

## 2015-08-27 NOTE — Progress Notes (Signed)
BP 142/88 mmHg  Pulse 69  Temp(Src) 97.9 F (36.6 C)  Ht 5\' 3"  (1.6 m)  Wt 163 lb (73.936 kg)  BMI 28.88 kg/m2  SpO2 95%   Subjective:    Patient ID: Mitchell [name], male    DOB: [birth year], 59 y.o.   MRN: [id number]  HPI: Mitchell [name] is a 59 y.o. male  Chief Complaint  Patient presents with  . Hypothyroidism  . Hyperlipidemia   HYPOTHYROIDISM- ran out about 1.5 weeks ago and hasn't picked up the refill. Thyroid control status:stable Satisfied with current treatment? yes Medication side effects: no Medication compliance: fair compliance Etiology of hypothyroidism:  Recent dose adjustment:yes Fatigue: yes Cold intolerance: no Heat intolerance: no Weight gain: no Weight loss: no Constipation: no Diarrhea/loose stools: no Palpitations: no Lower extremity edema: no Anxiety/depressed mood: no  HYPERLIPIDEMIA Hyperlipidemia status: just started medicine and stopped it a couple of days ago due to myalgias Satisfied with current treatment?  no Side effects:  yes Medication compliance: poor compliance Past cholesterol meds: none Supplements: none Aspirin:  yes Chest pain:  yes- only with the lipitor Coronary artery disease:  no  Relevant past medical, surgical, family and social history reviewed and updated as indicated. Interim medical history since our last visit reviewed. Allergies and medications reviewed and updated.  Review of Systems  Constitutional: Negative.   Respiratory: Negative.   Cardiovascular: Negative.   Musculoskeletal: Positive for myalgias. Negative for back pain, joint swelling, arthralgias, gait problem, neck pain and neck stiffness.  Psychiatric/Behavioral: Negative.     Per HPI unless specifically indicated above     Objective:    BP 142/88 mmHg  Pulse 69  Temp(Src) 97.9 F (36.6 C)  Ht 5\' 3"  (1.6 m)  Wt 163 lb (73.936 kg)  BMI 28.88 kg/m2  SpO2 95%  Wt Readings from Last 3 Encounters:  08/27/15 163 lb (73.936 kg)  08/10/15 167  lb (75.751 kg)  07/16/15 163 lb 3.2 oz (74.027 kg)    Physical Exam  Constitutional: He is oriented to person, place, and time. He appears well-developed and well-nourished. No distress.  HENT:  Head: Normocephalic and atraumatic.  Right Ear: Hearing and external ear normal.  Left Ear: Hearing and external ear normal.  Nose: Nose normal.  Mouth/Throat: Oropharynx is clear and moist. No oropharyngeal exudate.  Eyes: Conjunctivae, EOM and lids are normal. Pupils are equal, round, and reactive to light. Right eye exhibits no discharge. Left eye exhibits no discharge. No scleral icterus.  Cardiovascular: Normal rate, regular rhythm, normal heart sounds and intact distal pulses.  Exam reveals no gallop and no friction rub.   No murmur heard. Pulmonary/Chest: Effort normal and breath sounds normal. No respiratory distress. He has no wheezes. He has no rales. He exhibits no tenderness.  Musculoskeletal: Normal range of motion.  Neurological: He is alert and oriented to person, place, and time.  Skin: Skin is warm, dry and intact. No rash noted. No erythema. No pallor.  Psychiatric: He has a normal mood and affect. His speech is normal and behavior is normal. Judgment and thought content normal. Cognition and memory are normal.    Results for orders placed or performed in visit on 08/10/15  GC/Chlamydia Probe Amp  Result Value Ref Range   Chlamydia trachomatis, NAA Negative Negative   Neisseria gonorrhoeae by PCR Negative Negative  Microscopic Examination  Result Value Ref Range   WBC, UA None seen 0 -  5 /hpf   RBC, UA None seen 0 -  2 /hpf   Epithelial Cells (non renal) None seen 0 - 10 /hpf   Bacteria, UA None seen None seen/Few  Urinalysis, Complete  Result Value Ref Range   Specific Gravity, UA 1.025 1.005 - 1.030   pH, UA 6.0 5.0 - 7.5   Color, UA Yellow Yellow   Appearance Ur Clear Clear   Leukocytes, UA Negative Negative   Protein, UA Negative Negative/Trace   Glucose, UA  Negative Negative   Ketones, UA Negative Negative   RBC, UA Negative Negative   Bilirubin, UA Negative Negative   Urobilinogen, Ur 0.2 0.2 - 1.0 mg/dL   Nitrite, UA Negative Negative   Microscopic Examination See below:       Assessment & Plan:   Problem List Items Addressed This Visit      Endocrine   Hypothyroidism - Primary    Just started on levothyroxine and stopped it 1-2 weeks ago as he didn't get his refill. Checking labs today. If close to normal range, will continue current regimen and check back in 6 weeks.         Other   Hyperlipidemia    Could not tolerate the lipitor. Severe myalgias. Will start him on crestor 10. If having myalgias, encouraged him to call so we can change him to zetia. Recheck in 6 weeks with thyroid.       Relevant Medications   rosuvastatin (CRESTOR) 10 MG tablet       Follow up plan: Return in about 6 weeks (around 10/08/2015) for Follow up cholesterol and thyroid.

## 2015-08-27 NOTE — Assessment & Plan Note (Signed)
Just started on levothyroxine and stopped it 1-2 weeks ago as he didn't get his refill. Checking labs today. If close to normal range, will continue current regimen and check back in 6 weeks.

## 2015-08-27 NOTE — Patient Instructions (Addendum)
Start taking the crestor 1 tab at night before you go to bed. If you get muscle aches or weakness, don't wait! Call the office and let me know and we'll change you to something else.   Keep taking the levothyroxine- there are refills on the bottle. You need to take this medicine every day to keep your thyroid working, because your thyroid doesn't work the way it's supposed to. Call me if you have any problems. We'll see you again in 6 weeks to recheck your blood.   Rosuvastatin Tablets What is this medicine? ROSUVASTATIN (roe SOO va sta tin) is known as a HMG-CoA reductase inhibitor or 'statin'. It lowers cholesterol and triglycerides in the blood. This drug may also reduce the risk of heart attack, stroke, or other health problems in patients with risk factors for heart disease. Diet and lifestyle changes are often used with this drug. This medicine may be used for other purposes; ask your health care provider or pharmacist if you have questions. What should I tell my health care provider before I take this medicine? They need to know if you have any of these conditions: -frequently drink alcoholic beverages -kidney disease -liver disease -muscle aches or weakness -other medical condition -an unusual or allergic reaction to rosuvastatin, other medicines, foods, dyes, or preservatives -pregnant or trying to get pregnant -breast-feeding How should I use this medicine? Take this medicine by mouth with a glass of water. Follow the directions on the prescription label. Do not cut, crush or chew this medicine. You can take this medicine with or without food. Take your doses at regular intervals. Do not take your medicine more often than directed. Talk to your pediatrician regarding the use of this medicine in children. While this drug may be prescribed for children as young as 24 years old for selected conditions, precautions do apply. Overdosage: If you think you have taken too much of this medicine  contact a poison control center or emergency room at once. NOTE: This medicine is only for you. Do not share this medicine with others. What if I miss a dose? If you miss a dose, take it as soon as you can. Do not take 2 doses within 12 hours of each other. If there are less than 12 hours until your next dose, take only that dose. Do not take double or extra doses. What may interact with this medicine? Do not take this medicine with any of the following medications: -herbal medicines like red yeast rice This medicine may also interact with the following medications: -alcohol -antacids containing aluminum hydroxide or magnesium hydroxide -cyclosporine -other medicines for high cholesterol -some medicines for HIV infection -warfarin This list may not describe all possible interactions. Give your health care provider a list of all the medicines, herbs, non-prescription drugs, or dietary supplements you use. Also tell them if you smoke, drink alcohol, or use illegal drugs. Some items may interact with your medicine. What should I watch for while using this medicine? Visit your doctor or health care professional for regular check-ups. You may need regular tests to make sure your liver is working properly. Tell your doctor or health care professional right away if you get any unexplained muscle pain, tenderness, or weakness, especially if you also have a fever and tiredness. Your doctor or health care professional may tell you to stop taking this medicine if you develop muscle problems. If your muscle problems do not go away after stopping this medicine, contact your health care professional.  This medicine may affect blood sugar levels. If you have diabetes, check with your doctor or health care professional before you change your diet or the dose of your diabetic medicine. Avoid taking antacids containing aluminum, calcium or magnesium within 2 hours of taking this medicine. This drug is only part of  a total heart-health program. Your doctor or a dietician can suggest a low-cholesterol and low-fat diet to help. Avoid alcohol and smoking, and keep a proper exercise schedule. Do not use this drug if you are pregnant or breast-feeding. Serious side effects to an unborn child or to an infant are possible. Talk to your doctor or pharmacist for more information. What side effects may I notice from receiving this medicine? Side effects that you should report to your doctor or health care professional as soon as possible: -allergic reactions like skin rash, itching or hives, swelling of the face, lips, or tongue -dark urine -fever -joint pain -muscle cramps, pain -redness, blistering, peeling or loosening of the skin, including inside the mouth -trouble passing urine or change in the amount of urine -unusually weak or tired -yellowing of the eyes or skin Side effects that usually do not require medical attention (report to your doctor or health care professional if they continue or are bothersome): -constipation -heartburn -nausea -stomach gas, pain, upset This list may not describe all possible side effects. Call your doctor for medical advice about side effects. You may report side effects to FDA at 1-800-FDA-1088. Where should I keep my medicine? Keep out of the reach of children. Store at room temperature between 20 and 25 degrees C (68 and 77 degrees F). Keep container tightly closed (protect from moisture). Throw away any unused medicine after the expiration date. NOTE: This sheet is a summary. It may not cover all possible information. If you have questions about this medicine, talk to your doctor, pharmacist, or health care provider.    2016, Elsevier/Gold Standard. (2015-02-19 13:33:08) Hypothyroidism Hypothyroidism is a disorder of the thyroid. The thyroid is a large gland that is located in the lower front of the neck. The thyroid releases hormones that control how the body works.  With hypothyroidism, the thyroid does not make enough of these hormones. CAUSES Causes of hypothyroidism may include:  Viral infections.  Pregnancy.  Your own defense system (immune system) attacking your thyroid.  Certain medicines.  Birth defects.  Past radiation treatments to your head or neck.  Past treatment with radioactive iodine.  Past surgical removal of part or all of your thyroid.  Problems with the gland that is located in the center of your brain (pituitary). SIGNS AND SYMPTOMS Signs and symptoms of hypothyroidism may include:  Feeling as though you have no energy (lethargy).  Inability to tolerate cold.  Weight gain that is not explained by a change in diet or exercise habits.  Dry skin.  Coarse hair.  Menstrual irregularity.  Slowing of thought processes.  Constipation.  Sadness or depression. DIAGNOSIS  Your health care provider may diagnose hypothyroidism with blood tests and ultrasound tests. TREATMENT Hypothyroidism is treated with medicine that replaces the hormones that your body does not make. After you begin treatment, it may take several weeks for symptoms to go away. HOME CARE INSTRUCTIONS   Take medicines only as directed by your health care provider.  If you start taking any new medicines, tell your health care provider.  Keep all follow-up visits as directed by your health care provider. This is important. As your condition improves, your  dosage needs may change. You will need to have blood tests regularly so that your health care provider can watch your condition. SEEK MEDICAL CARE IF:  Your symptoms do not get better with treatment.  You are taking thyroid replacement medicine and:  You sweat excessively.  You have tremors.  You feel anxious.  You lose weight rapidly.  You cannot tolerate heat.  You have emotional swings.  You have diarrhea.  You feel weak. SEEK IMMEDIATE MEDICAL CARE IF:   You develop chest  pain.  You develop an irregular heartbeat.  You develop a rapid heartbeat.   This information is not intended to replace advice given to you by your health care provider. Make sure you discuss any questions you have with your health care provider.   Document Released: 09/05/2005 Document Revised: 09/26/2014 Document Reviewed: 01/21/2014 Elsevier Interactive Patient Education [employer].

## 2015-08-28 ENCOUNTER — Telehealth: Payer: Self-pay | Admitting: Family Medicine

## 2015-08-28 ENCOUNTER — Other Ambulatory Visit: Payer: Self-pay | Admitting: Family Medicine

## 2015-08-28 LAB — T4: T4 TOTAL: 4.5 ug/dL (ref 4.5–12.0)

## 2015-08-28 LAB — TSH: TSH: 8.2 u[IU]/mL — AB (ref 0.450–4.500)

## 2015-08-28 LAB — T3: T3 TOTAL: 127 ng/dL (ref 71–180)

## 2015-08-28 MED ORDER — LEVOTHYROXINE SODIUM 50 MCG PO TABS: 50.0000 ug | ORAL_TABLET | Freq: Every day | ORAL | Status: DC

## 2015-08-28 NOTE — Telephone Encounter (Signed)
Please let him know that his thyroid is still under active. New Rx sent to his pharmacy. We'll see him in 6 weeks to recheck his levels.

## 2015-09-01 NOTE — Telephone Encounter (Signed)
Patient notified

## 2015-09-04 ENCOUNTER — Emergency Department: Payer: Medicare Other

## 2015-09-04 ENCOUNTER — Emergency Department
Admission: EM | Admit: 2015-09-04 | Discharge: 2015-09-04 | Disposition: A | Discharge status: Home / Self Care | Payer: Medicare Other | Attending: Emergency Medicine | Admitting: Emergency Medicine

## 2015-09-04 ENCOUNTER — Encounter: Payer: Self-pay | Admitting: *Deleted

## 2015-09-04 DIAGNOSIS — R0789 Other chest pain: Secondary | ICD-10-CM | POA: Diagnosis not present

## 2015-09-04 DIAGNOSIS — Z79899 Other long term (current) drug therapy: Secondary | ICD-10-CM | POA: Diagnosis not present

## 2015-09-04 DIAGNOSIS — I10 Essential (primary) hypertension: Secondary | ICD-10-CM | POA: Diagnosis not present

## 2015-09-04 DIAGNOSIS — Z87891 Personal history of nicotine dependence: Secondary | ICD-10-CM | POA: Insufficient documentation

## 2015-09-04 DIAGNOSIS — Z7982 Long term (current) use of aspirin: Secondary | ICD-10-CM | POA: Insufficient documentation

## 2015-09-04 DIAGNOSIS — R079 Chest pain, unspecified: Secondary | ICD-10-CM | POA: Insufficient documentation

## 2015-09-04 LAB — CBC
HCT: 46.4 % (ref 40.0–52.0)
HEMOGLOBIN: 15.6 g/dL (ref 13.0–18.0)
MCH: 32 pg (ref 26.0–34.0)
MCHC: 33.7 g/dL (ref 32.0–36.0)
MCV: 94.9 fL (ref 80.0–100.0)
PLATELETS: 298 10*3/uL (ref 150–440)
RBC: 4.89 MIL/uL (ref 4.40–5.90)
RDW: 12.9 % (ref 11.5–14.5)
WBC: 10.7 10*3/uL — ABNORMAL HIGH (ref 3.8–10.6)

## 2015-09-04 LAB — BASIC METABOLIC PANEL
ANION GAP: 8 (ref 5–15)
BUN: 21 mg/dL — ABNORMAL HIGH (ref 6–20)
CALCIUM: 9.1 mg/dL (ref 8.9–10.3)
CO2: 25 mmol/L (ref 22–32)
CREATININE: 1.13 mg/dL (ref 0.61–1.24)
Chloride: 106 mmol/L (ref 101–111)
Glucose, Bld: 95 mg/dL (ref 65–99)
Potassium: 4 mmol/L (ref 3.5–5.1)
Sodium: 139 mmol/L (ref 135–145)

## 2015-09-04 LAB — TROPONIN I

## 2015-09-04 NOTE — Discharge Instructions (Signed)
You were evaluated for intermittent left sided chest discomfort and left arm tingling, and although no certain cause was found, your exam and evaluation Emergency Department are reassuring.  Return to the emergency department for any new or worsening condition including any worsening chest pain, weakness or numbness, nausea or sweating, dizziness or passing out.   Nonspecific Chest Pain It is often hard to find the cause of chest pain. There is always a chance that your pain could be related to something serious, such as a heart attack or a blood clot in your lungs. Chest pain can also be caused by conditions that are not life-threatening. If you have chest pain, it is very important to follow up with your doctor.  HOME CARE  If you were prescribed an antibiotic medicine, finish it all even if you start to feel better.  Avoid any activities that cause chest pain.  Do not use any tobacco products, including cigarettes, chewing tobacco, or electronic cigarettes. If you need help quitting, ask your doctor.  Do not drink alcohol.  Take medicines only as told by your doctor.  Keep all follow-up visits as told by your doctor. This is important. This includes any further testing if your chest pain does not go away.  Your doctor may tell you to keep your head raised (elevated) while you sleep.  Make lifestyle changes as told by your doctor. These may include:  Getting regular exercise. Ask your doctor to suggest some activities that are safe for you.  Eating a heart-healthy diet. Your doctor or a diet specialist (dietitian) can help you to learn healthy eating options.  Maintaining a healthy weight.  Managing diabetes, if necessary.  Reducing stress. GET HELP IF:  Your chest pain does not go away, even after treatment.  You have a rash with blisters on your chest.  You have a fever. GET HELP RIGHT AWAY IF:  Your chest pain is worse.  You have an increasing cough, or you cough up  blood.  You have severe belly (abdominal) pain.  You feel extremely weak.  You pass out (faint).  You have chills.  You have sudden, unexplained chest discomfort.  You have sudden, unexplained discomfort in your arms, back, neck, or jaw.  You have shortness of breath at any time.  You suddenly start to sweat, or your skin gets clammy.  You feel nauseous.  You vomit.  You suddenly feel light-headed or dizzy.  Your heart begins to beat quickly, or it feels like it is skipping beats. These symptoms may be an emergency. Do not wait to see if the symptoms will go away. Get medical help right away. Call your local emergency services (911 in the [place] Do not drive yourself to the hospital.   This information is not intended to replace advice given to you by your health care provider. Make sure you discuss any questions you have with your health care provider.   Document Released: 02/22/2008 Document Revised: 09/26/2014 Document Reviewed: 04/11/2014 Elsevier Interactive Patient Education [employer].

## 2015-09-04 NOTE — ED Provider Notes (Signed)
[facility] Emergency Department Provider Note   ____________________________________________  Time seen:  I have reviewed the triage vital signs and the triage nursing note.  HISTORY  Chief Complaint Chest Pain   Historian Patient  HPI Mitchell [name] is a 59 y.o. male with a history of hypertension, and without any known coronary artery disease per the patient, but takes a baby aspirin, is here for evaluation of intermittent left-sided chest discomfort. Symptoms started about a month and half ago and are not exertional. They can occur at any point in time, and are not necessarily associated with anything he knows.He's had intermittent tingling and pain of the left arm which she points to the left elbow and left hand. Currently has no chest pain and no arm symptoms. He said the last time he had the symptoms it was yesterday. No shortness of breath.  Reports his left breast muscle hurts because of sexual activity - partner had been "sucking hard" on the nipple.    Past Medical History  Diagnosis Date  . Hepatitis C     Tested positive, treated now told that he does not have it  . Anxiety   . Hypertension   . Depression   . IFG (impaired fasting glucose)   . Schizophrenia ([place])   . Insomnia   . Hydrocele   . Epididymitis   . Kidney stone     Patient Active Problem List   Diagnosis Date Noted  . Swollen testicle 08/14/2015  . BPH with obstruction/lower urinary tract symptoms 08/14/2015  . Hyperlipidemia 07/22/2015  . Hypothyroidism 07/17/2015  . BPH (benign prostatic hyperplasia) 07/16/2015  . Scrotal swelling 07/16/2015  . Hydrocele, right 07/08/2015  . Drug-induced erectile dysfunction 07/08/2015  . IFG (impaired fasting glucose)   . Schizophrenia ([place])   . Insomnia   . Anxiety   . Hypertension   . Depression   . Hepatitis C     Past Surgical History  Procedure Laterality Date  . Ankle fracture surgery    . Colonoscopy  2010  . Hernia  repair  2011    X 2    Current Outpatient Rx  Name  Route  Sig  Dispense  Refill  . aspirin EC 81 MG tablet   Oral   Take 1 tablet (81 mg total) by mouth daily.         . cetirizine (ZYRTEC) 5 MG tablet   Oral   Take 5 mg by mouth daily.         . clonazePAM (KLONOPIN) 0.5 MG tablet   Oral   Take 0.5 mg by mouth 3 (three) times daily as needed for anxiety.         . haloperidol (HALDOL) 1 MG tablet               . levothyroxine (SYNTHROID, LEVOTHROID) 50 MCG tablet   Oral   Take 1 tablet (50 mcg total) by mouth daily before breakfast. Return in 6 weeks for blood test   30 tablet   1   . lisinopril (PRINIVIL,ZESTRIL) 5 MG tablet   Oral   Take 1 tablet (5 mg total) by mouth daily.   30 tablet   3   . rosuvastatin (CRESTOR) 10 MG tablet   Oral   Take 1 tablet (10 mg total) by mouth daily.   30 tablet   1   . sildenafil (VIAGRA) 100 MG tablet   Oral   Take 1 tablet (100 mg total) by  mouth daily as needed for erectile dysfunction.   30 tablet   6   . zolpidem (AMBIEN) 10 MG tablet                 Allergies Atorvastatin and Codeine  Family History  Problem Relation Age of Onset  . Diabetes Mother   . Alzheimer's disease Father   . Diabetes Brother   . Diabetes Maternal Grandfather     Social History Social History  Substance Use Topics  . Smoking status: Former Smoker    Quit date: 07/06/1995  . Smokeless tobacco: Former Systems developer  . Alcohol Use: No     Comment: remote history, quit 20+ years ago    Review of Systems  Constitutional: Negative for fever. Eyes: Negative for visual changes. ENT: Negative for sore throat. Cardiovascular: Positive for chest pain. Respiratory: Negative for shortness of breath. Gastrointestinal: Negative for abdominal pain, vomiting and diarrhea. Genitourinary: Negative for dysuria. Musculoskeletal: Negative for back pain. Skin: Negative for rash. Neurological: Negative for headache. 10 point Review of  Systems otherwise negative ____________________________________________   PHYSICAL EXAM:  VITAL SIGNS: ED Triage Vitals  Enc Vitals Group     BP 09/04/15 1420 151/83 mmHg     Pulse Rate 09/04/15 1420 51     Resp 09/04/15 1420 18     Temp 09/04/15 1420 98 F (36.7 C)     Temp Source 09/04/15 1420 Oral     SpO2 09/04/15 1420 97 %     Weight 09/04/15 1420 165 lb (74.844 kg)     Height 09/04/15 1420 5\' 3"  (1.6 m)     Head Cir --      Peak Flow --      Pain Score 09/04/15 1421 0     Pain Loc --      Pain Edu? --      Excl. in [place]? --      Constitutional: Alert and oriented. Well appearing and in no distress. Eyes: Conjunctivae are normal. PERRL. Normal extraocular movements. ENT   Head: Normocephalic and atraumatic.   Nose: No congestion/rhinnorhea.   Mouth/Throat: Mucous membranes are moist.   Neck: No stridor. Cardiovascular/Chest: Normal rate, regular rhythm.  No murmurs, rubs, or gallops. No skin rash or redness or injury noted to the chest wall or nipples. Respiratory: Normal respiratory effort without tachypnea nor retractions. Breath sounds are clear and equal bilaterally. No wheezes/rales/rhonchi. Gastrointestinal: Soft. No distention, no guarding, no rebound. Nontender.  Genitourinary/rectal:Deferred Musculoskeletal: Nontender with normal range of motion in all extremities. No joint effusions.  No lower extremity tenderness.  No edema. Neurologic:  Normal speech and language. No gross or focal neurologic deficits are appreciated. Skin:  Skin is warm, dry and intact. No rash noted. Psychiatric: Mood and affect are normal. Speech and behavior are normal. Patient exhibits appropriate insight and judgment.  ____________________________________________   EKG I, Lisa Roca, MD, the attending physician have personally viewed and interpreted all ECGs.  69 bpm. Normal sinus rhythm. Narrow QRS. Normal axis. Nonspecific  T-wave ____________________________________________  LABS (pertinent positives/negatives)  Basic metabolic panel without significant abnormalities White blood cell count 10.7, hemoglobin 15.6 and platelet count 298 Troponin less than 0.03  ____________________________________________  RADIOLOGY All Xrays were viewed by me. Imaging interpreted by Radiologist.  Chest 2 view: No active cardiopulmonary disease __________________________________________  PROCEDURES  Procedure(s) performed: None  Critical Care performed: None  ____________________________________________   ED COURSE / ASSESSMENT AND PLAN  CONSULTATIONS: Dr. Nehemiah Massed, cardiology - appointment in office Monday  1:30pm  Pertinent labs & imaging results that were available during my care of the patient were reviewed by me and considered in my medical decision making (see chart for details).  Patient's here for evaluation and he states to be "checked out "before his release are given for the weekend tomorrow morning. He's been having ongoing and intermittent symptoms for a month and a half and has not been evaluated for this. No known coronary artery disease, by history, and states he had a stress test at some point in the past, and he does have a history of hypertension. He does not follow with a cardiologist.  He is asymptomatic today, and last episode was yesterday. His exam and evaluation are reassuring in the emergency room. I discussed the case with the on-call cardiologist, Dr. Nehemiah Massed, and patient will be seen on Monday.  In terms of the arm symptoms, and some ways it sounds like there may be a radicular component, however he's having no neck pain, and I do not suspect acute stroke or TIA based on his symptoms.   Patient / Family / Caregiver informed of clinical course, medical decision-making process, and agree with plan.   I discussed return precautions, follow-up instructions, and discharged instructions  with patient and/or family.  ___________________________________________   FINAL CLINICAL IMPRESSION(S) / ED DIAGNOSES   Final diagnoses:  Nonspecific chest pain       Lisa Roca, MD 09/04/15 1615

## 2015-09-04 NOTE — ED Notes (Signed)
Pt reports chest pain for the last month with occasional radiation to left arm

## 2015-09-04 NOTE — ED Notes (Signed)
MD at bedside. 

## 2015-09-25 ENCOUNTER — Other Ambulatory Visit: Payer: Self-pay | Admitting: Family Medicine

## 2015-09-30 DIAGNOSIS — J019 Acute sinusitis, unspecified: Secondary | ICD-10-CM | POA: Diagnosis not present

## 2015-10-08 ENCOUNTER — Encounter: Payer: Self-pay | Admitting: Family Medicine

## 2015-10-08 ENCOUNTER — Ambulatory Visit (INDEPENDENT_AMBULATORY_CARE_PROVIDER_SITE_OTHER): Payer: Medicare HMO | Admitting: Family Medicine

## 2015-10-08 VITALS — BP 132/75 | HR 74 | Temp 97.4°F | Ht 61.6 in | Wt 165.0 lb

## 2015-10-08 DIAGNOSIS — L578 Other skin changes due to chronic exposure to nonionizing radiation: Secondary | ICD-10-CM | POA: Diagnosis not present

## 2015-10-08 DIAGNOSIS — E038 Other specified hypothyroidism: Secondary | ICD-10-CM

## 2015-10-08 DIAGNOSIS — E785 Hyperlipidemia, unspecified: Secondary | ICD-10-CM

## 2015-10-08 NOTE — Assessment & Plan Note (Signed)
Labs checked today. Await results and adjust meds as needed. Continue to monitor.

## 2015-10-08 NOTE — Progress Notes (Signed)
BP 132/75 mmHg  Pulse 74  Temp(Src) 97.4 F (36.3 C)  Ht 5' 1.6" (1.565 m)  Wt 165 lb (74.844 kg)  BMI 30.56 kg/m2  SpO2 95%   Subjective:    Patient ID: Mitchell [name], male    DOB: July 18, 1956, 60 y.o.   MRN: [id number]  HPI: Mitchell [name] is a 60 y.o. male  Chief Complaint  Patient presents with  . Hypothyroidism  . Hyperlipidemia  . FYI    Patient states that he is currently on an antibiotic, but he does not know the name of it.   HYPOTHYROIDISM Thyroid control status:better Satisfied with current treatment? yes Medication side effects: no Medication compliance: excellent compliance Recent dose adjustment:yes Fatigue: no Cold intolerance: no Heat intolerance: no Weight gain: no Weight loss: no Constipation: no Diarrhea/loose stools: no Palpitations: no Lower extremity edema: no Anxiety/depressed mood: no  HYPERLIPIDEMIA Hyperlipidemia status: excellent compliance Satisfied with current treatment?  yes Side effects:  no Medication compliance: excellent compliance Supplements: none Aspirin:  no Chest pain:  no Coronary artery disease:  no Family history CAD:  yes Family history early CAD:  yes  SKIN LESION Duration: chronic, skin damage Location: shoulders and back Painful: no Itching: yes Onset: gradual Context: not changing Associated signs and symptoms: none History of skin cancer: no History of precancerous skin lesions: no Family history of skin cancer: unknown   Relevant past medical, surgical, family and social history reviewed and updated as indicated. Interim medical history since our last visit reviewed. Allergies and medications reviewed and updated.  Review of Systems  Constitutional: Negative.   Respiratory: Negative.   Psychiatric/Behavioral: Negative.     Per HPI unless specifically indicated above     Objective:    BP 132/75 mmHg  Pulse 74  Temp(Src) 97.4 F (36.3 C)  Ht 5' 1.6" (1.565 m)  Wt 165 lb (74.844 kg)  BMI  30.56 kg/m2  SpO2 95%  Wt Readings from Last 3 Encounters:  10/08/15 165 lb (74.844 kg)  09/04/15 165 lb (74.844 kg)  08/27/15 163 lb (73.936 kg)    Physical Exam  Constitutional: He is oriented to person, place, and time. He appears well-developed and well-nourished. No distress.  HENT:  Head: Normocephalic and atraumatic.  Right Ear: Hearing normal.  Left Ear: Hearing normal.  Nose: Nose normal.  Eyes: Conjunctivae and lids are normal. Right eye exhibits no discharge. Left eye exhibits no discharge. No scleral icterus.  Cardiovascular: Normal rate, regular rhythm, normal heart sounds and intact distal pulses.  Exam reveals no gallop and no friction rub.   No murmur heard. Pulmonary/Chest: Effort normal and breath sounds normal. No respiratory distress. He has no wheezes. He has no rales.  Musculoskeletal: Normal range of motion.  Neurological: He is alert and oriented to person, place, and time.  Skin: Skin is warm, dry and intact. No rash noted. No erythema. No pallor.  Significant sun damage on shoulders and back, lipoma on L scapular border, several SKs  Psychiatric: He has a normal mood and affect. His speech is normal and behavior is normal. Judgment and thought content normal. Cognition and memory are normal.  Nursing note and vitals reviewed.   Results for orders placed or performed during the hospital encounter of [id number]  Basic metabolic panel  Result Value Ref Range   Sodium 139 135 - 145 mmol/L   Potassium 4.0 3.5 - 5.1 mmol/L   Chloride 106 101 - 111 mmol/L   CO2 25 22 - 32  mmol/L   Glucose, Bld 95 65 - 99 mg/dL   BUN 21 (H) 6 - 20 mg/dL   Creatinine, Ser 1.13 0.61 - 1.24 mg/dL   Calcium 9.1 8.9 - 10.3 mg/dL   GFR calc non Af Amer >60 >60 mL/min   GFR calc Af Amer >60 >60 mL/min   Anion gap 8 5 - 15  CBC  Result Value Ref Range   WBC 10.7 (H) 3.8 - 10.6 K/uL   RBC 4.89 4.40 - 5.90 MIL/uL   Hemoglobin 15.6 13.0 - 18.0 g/dL   HCT 46.4 40.0 - 52.0 %   MCV  94.9 80.0 - 100.0 fL   MCH 32.0 26.0 - 34.0 pg   MCHC 33.7 32.0 - 36.0 g/dL   RDW 12.9 11.5 - 14.5 %   Platelets 298 150 - 440 K/uL  Troponin I  Result Value Ref Range   Troponin I <0.03 <0.031 ng/mL      Assessment & Plan:   Problem List Items Addressed This Visit      Endocrine   Hypothyroidism - Primary    Labs checked today. Await results and adjust meds as needed. Continue to monitor.       Relevant Orders   TSH     Other   Hyperlipidemia    Checking labs today. Doing well in his crestor. Continue current regimen. Continue to monitor. Recheck 6 months.        Other Visit Diagnoses    Sun-damaged skin        Significant sun damage and numerous moles. Referral to dermatology today for skin check.     Relevant Orders    Ambulatory referral to Dermatology        Follow up plan: Return in about 6 months (around 04/06/2016) for Unless labs say different for PE.

## 2015-10-08 NOTE — Assessment & Plan Note (Signed)
Checking labs today. Doing well in his crestor. Continue current regimen. Continue to monitor. Recheck 6 months.

## 2015-10-09 ENCOUNTER — Telehealth: Payer: Self-pay | Admitting: Family Medicine

## 2015-10-09 ENCOUNTER — Encounter: Payer: Self-pay | Admitting: Family Medicine

## 2015-10-09 ENCOUNTER — Telehealth: Payer: Self-pay

## 2015-10-09 LAB — COMPREHENSIVE METABOLIC PANEL
ALK PHOS: 50 IU/L (ref 39–117)
ALT: 19 IU/L (ref 0–44)
AST: 25 IU/L (ref 0–40)
Albumin/Globulin Ratio: 1.2 (ref 1.1–2.5)
Albumin: 3.9 g/dL (ref 3.6–4.8)
BUN / CREAT RATIO: 16 (ref 10–22)
BUN: 18 mg/dL (ref 8–27)
CHLORIDE: 101 mmol/L (ref 96–106)
CO2: 25 mmol/L (ref 18–29)
Calcium: 9.2 mg/dL (ref 8.6–10.2)
Creatinine, Ser: 1.11 mg/dL (ref 0.76–1.27)
GFR calc Af Amer: 83 mL/min/{1.73_m2} (ref >59)
GFR calc non Af Amer: 72 mL/min/{1.73_m2} (ref >59)
GLUCOSE: 101 mg/dL — AB (ref 65–99)
Globulin, Total: 3.2 g/dL (ref 1.5–4.5)
Potassium: 4.6 mmol/L (ref 3.5–5.2)
Sodium: 141 mmol/L (ref 134–144)
Total Protein: 7.1 g/dL (ref 6.0–8.5)

## 2015-10-09 LAB — LIPID PANEL W/O CHOL/HDL RATIO
Cholesterol, Total: 166 mg/dL (ref 100–199)
HDL: 31 mg/dL — AB (ref >39)
TRIGLYCERIDES: 432 mg/dL — AB (ref 0–149)

## 2015-10-09 LAB — TSH: TSH: 2.08 u[IU]/mL (ref 0.450–4.500)

## 2015-10-09 MED ORDER — LISINOPRIL 5 MG PO TABS: 5.0000 mg | ORAL_TABLET | Freq: Every day | ORAL | Status: DC

## 2015-10-09 MED ORDER — LEVOTHYROXINE SODIUM 50 MCG PO TABS: 50.0000 ug | ORAL_TABLET | Freq: Every day | ORAL | Status: DC

## 2015-10-09 NOTE — Telephone Encounter (Signed)
Patient notified

## 2015-10-09 NOTE — Telephone Encounter (Signed)
Refill needed for the following: Lisinopril 5mg  Haloperidol 1mg 

## 2015-10-09 NOTE — Telephone Encounter (Signed)
Can you please let Mitchell [name] know that his labs look good, and I've sent a year of his thyroid medicine over to the pharmacy? I'll see him in 6 months. Thanks!

## 2015-10-09 NOTE — Telephone Encounter (Signed)
Lisinopril sent through for him. I don't write his haldol, I think his psychiatrist writes it

## 2015-11-09 ENCOUNTER — Ambulatory Visit: Payer: Medicare HMO | Admitting: Family Medicine

## 2015-11-12 ENCOUNTER — Emergency Department
Admission: EM | Admit: 2015-11-12 | Discharge: 2015-11-12 | Disposition: A | Discharge status: Home / Self Care | Payer: Commercial Managed Care - HMO | Attending: Emergency Medicine | Admitting: Emergency Medicine

## 2015-11-12 ENCOUNTER — Emergency Department: Payer: Commercial Managed Care - HMO

## 2015-11-12 DIAGNOSIS — Y9239 Other specified sports and athletic area as the place of occurrence of the external cause: Secondary | ICD-10-CM | POA: Insufficient documentation

## 2015-11-12 DIAGNOSIS — F209 Schizophrenia, unspecified: Secondary | ICD-10-CM | POA: Diagnosis not present

## 2015-11-12 DIAGNOSIS — Z7982 Long term (current) use of aspirin: Secondary | ICD-10-CM | POA: Insufficient documentation

## 2015-11-12 DIAGNOSIS — Y9354 Activity, bowling: Secondary | ICD-10-CM | POA: Diagnosis not present

## 2015-11-12 DIAGNOSIS — Z87891 Personal history of nicotine dependence: Secondary | ICD-10-CM | POA: Diagnosis not present

## 2015-11-12 DIAGNOSIS — W228XXA Striking against or struck by other objects, initial encounter: Secondary | ICD-10-CM | POA: Insufficient documentation

## 2015-11-12 DIAGNOSIS — I10 Essential (primary) hypertension: Secondary | ICD-10-CM | POA: Diagnosis not present

## 2015-11-12 DIAGNOSIS — Z79899 Other long term (current) drug therapy: Secondary | ICD-10-CM | POA: Diagnosis not present

## 2015-11-12 DIAGNOSIS — M79644 Pain in right finger(s): Secondary | ICD-10-CM | POA: Diagnosis not present

## 2015-11-12 DIAGNOSIS — F419 Anxiety disorder, unspecified: Secondary | ICD-10-CM | POA: Diagnosis not present

## 2015-11-12 DIAGNOSIS — M7989 Other specified soft tissue disorders: Secondary | ICD-10-CM | POA: Diagnosis not present

## 2015-11-12 DIAGNOSIS — F329 Major depressive disorder, single episode, unspecified: Secondary | ICD-10-CM | POA: Insufficient documentation

## 2015-11-12 DIAGNOSIS — S63619A Unspecified sprain of unspecified finger, initial encounter: Secondary | ICD-10-CM

## 2015-11-12 DIAGNOSIS — B1920 Unspecified viral hepatitis C without hepatic coma: Secondary | ICD-10-CM | POA: Diagnosis not present

## 2015-11-12 DIAGNOSIS — Y998 Other external cause status: Secondary | ICD-10-CM | POA: Insufficient documentation

## 2015-11-12 DIAGNOSIS — S63612A Unspecified sprain of right middle finger, initial encounter: Secondary | ICD-10-CM | POA: Diagnosis not present

## 2015-11-12 DIAGNOSIS — S6991XA Unspecified injury of right wrist, hand and finger(s), initial encounter: Secondary | ICD-10-CM | POA: Diagnosis present

## 2015-11-12 MED ORDER — NAPROXEN 500 MG PO TABS
500.0000 mg | ORAL_TABLET | Freq: Once | ORAL | Status: AC
  Administered 2015-11-12: 500 mg via ORAL
  Filled 2015-11-12: qty 1

## 2015-11-12 MED ORDER — NAPROXEN 500 MG PO TABS
500.0000 mg | ORAL_TABLET | Freq: Two times a day (BID) | ORAL | Status: DC
Start: 2015-11-12 — End: 2016-07-04

## 2015-11-12 NOTE — ED Notes (Signed)
States he got his right middle finger stuck in a bowling ball about 2 weeks   conts to have pain and swelling to finger

## 2015-11-12 NOTE — ED Notes (Signed)
Pt states he injured his right 3rd finger while bowling 2 weeks ago and states it just is not getting any better.

## 2015-11-12 NOTE — Discharge Instructions (Signed)
Finger Sprain °A finger sprain happens when the bands of tissue that hold the finger bones together (ligaments) stretch too much and tear. °HOME CARE °· Keep your injured finger raised (elevated) when possible. °· Put ice on the injured area, twice a day, for 2 to 3 days. °¨ Put ice in a plastic bag. °¨ Place a towel between your skin and the bag. °¨ Leave the ice on for 15 minutes. °· Only take medicine as told by your doctor. °· Do not wear rings on the injured finger. °· Protect your finger until pain and stiffness go away (usually 3 to 4 weeks). °· Do not get your cast or splint to get wet. Cover your cast or splint with a plastic bag when you shower or bathe. Do not swim. °· Your doctor may suggest special exercises for you to do. These exercises will help keep or stop stiffness from happening. °GET HELP RIGHT AWAY IF: °· Your cast or splint gets damaged. °· Your pain gets worse, not better. °MAKE SURE YOU: °· Understand these instructions. °· Will watch your condition. °· Will get help right away if you are not doing well or get worse. °  °This information is not intended to replace advice given to you by your health care provider. Make sure you discuss any questions you have with your health care provider. °  °Document Released: 10/08/2010 Document Revised: 11/28/2011 Document Reviewed: 05/09/2011 °Elsevier Interactive Patient Education ©2016 [employer] ° °

## 2015-11-12 NOTE — ED Provider Notes (Signed)
[facility] Emergency Department Provider Note  ____________________________________________  Time seen: Approximately 2:02 PM  I have reviewed the triage vital signs and the nursing notes.   HISTORY  Chief Complaint Hand Pain    HPI Mitchell [name] is a 60 y.o. male patient complaining of pain and edema to the third digit right hand secondary to being stuck in a bowling ball 2 weeks ago. Patient had a previous injury 6 months ago to the same digit. Patient was x-rayed he is put in a splint but felt splint did not contribute to the healing process. Patient states continue to have deformity to the distal phalangeal joint of the third digit right hand. Patient's redness pain is a 5/10 describe the pain as "aching". No palliative measures taken for this complaint. Patient is right-hand dominant.   Past Medical History  Diagnosis Date  . Hepatitis C     Tested positive, treated now told that he does not have it  . Anxiety   . Hypertension   . Depression   . IFG (impaired fasting glucose)   . Schizophrenia ([place])   . Insomnia   . Hydrocele   . Epididymitis   . Kidney stone     Patient Active Problem List   Diagnosis Date Noted  . Swollen testicle 08/14/2015  . BPH with obstruction/lower urinary tract symptoms 08/14/2015  . Hyperlipidemia 07/22/2015  . Hypothyroidism 07/17/2015  . BPH (benign prostatic hyperplasia) 07/16/2015  . Scrotal swelling 07/16/2015  . Hydrocele, right 07/08/2015  . Drug-induced erectile dysfunction 07/08/2015  . IFG (impaired fasting glucose)   . Schizophrenia ([place])   . Insomnia   . Anxiety   . Hypertension   . Depression   . Hepatitis C     Past Surgical History  Procedure Laterality Date  . Ankle fracture surgery    . Colonoscopy  2010  . Hernia repair  2011    X 2    Current Outpatient Rx  Name  Route  Sig  Dispense  Refill  . aspirin EC 81 MG tablet   Oral   Take 1 tablet (81 mg total) by mouth daily.          . cetirizine (ZYRTEC) 5 MG tablet   Oral   Take 5 mg by mouth daily.         . clonazePAM (KLONOPIN) 0.5 MG tablet   Oral   Take 0.5 mg by mouth 3 (three) times daily as needed for anxiety.         . haloperidol (HALDOL) 1 MG tablet               . levothyroxine (SYNTHROID, LEVOTHROID) 50 MCG tablet   Oral   Take 1 tablet (50 mcg total) by mouth daily before breakfast.   30 tablet   12   . lisinopril (PRINIVIL,ZESTRIL) 5 MG tablet   Oral   Take 1 tablet (5 mg total) by mouth daily.   90 tablet   1   . rosuvastatin (CRESTOR) 10 MG tablet      TAKE 1 TABLET BY MOUTH ONCE DAILY   30 tablet   6   . sildenafil (VIAGRA) 100 MG tablet   Oral   Take 1 tablet (100 mg total) by mouth daily as needed for erectile dysfunction.   30 tablet   6   . zolpidem (AMBIEN) 10 MG tablet                 Allergies  Atorvastatin and Codeine  Family History  Problem Relation Age of Onset  . Diabetes Mother   . Alzheimer's disease Father   . Diabetes Brother   . Diabetes Maternal Grandfather     Social History Social History  Substance Use Topics  . Smoking status: Former Smoker    Quit date: 07/06/1995  . Smokeless tobacco: Former Systems developer  . Alcohol Use: No     Comment: remote history, quit 20+ years ago    Review of Systems Constitutional: No fever/chills Eyes: No visual changes. ENT: No sore throat. Cardiovascular: Denies chest pain. Respiratory: Denies shortness of breath. Gastrointestinal: No abdominal pain.  No nausea, no vomiting.  No diarrhea.  No constipation. Genitourinary: Negative for dysuria. Musculoskeletal: Negative for back pain. Skin: Negative for rash. Neurological: Negative for headaches, focal weakness or numbness. Psychiatric:Depression,  anxiety and schizophrenia. Endocrine:Hypertension Hematological/Lymphatic:Hepatitis C Allergic/Immunilogical: See medication list 10-point ROS otherwise  negative.  ____________________________________________   PHYSICAL EXAM:  VITAL SIGNS: ED Triage Vitals  Enc Vitals Group     BP 11/12/15 1340 134/71 mmHg     Pulse Rate 11/12/15 1340 76     Resp 11/12/15 1340 24     Temp 11/12/15 1340 98.2 F (36.8 C)     Temp Source 11/12/15 1340 Oral     SpO2 11/12/15 1340 95 %     Weight 11/12/15 1340 165 lb (74.844 kg)     Height 11/12/15 1340 5\' 2"  (1.575 m)     Head Cir --      Peak Flow --      Pain Score 11/12/15 1349 5     Pain Loc --      Pain Edu? --      Excl. in [place]? --     Constitutional: Alert and oriented. Well appearing and in no acute distress. Eyes: Conjunctivae are normal. PERRL. EOMI. Head: Atraumatic. Nose: No congestion/rhinnorhea. Mouth/Throat: Mucous membranes are moist.  Oropharynx non-erythematous. Neck: No stridor.  No cervical spine tenderness to palpation. Hematological/Lymphatic/Immunilogical: No cervical lymphadenopathy. Cardiovascular: Normal rate, regular rhythm. Grossly normal heart sounds.  Good peripheral circulation. Respiratory: Normal respiratory effort.  No retractions. Lungs CTAB. Gastrointestinal: Soft and nontender. No distention. No abdominal bruits. No CVA tenderness. Musculoskeletal: Obvious edema but no obvious deformity of the proximal phalange third digit right hand. Neurologic:  Normal speech and language. No gross focal neurologic deficits are appreciated. No gait instability. Skin:  Skin is warm, dry and intact. No rash noted. Psychiatric: Mood and affect are normal. Speech and behavior are normal.  ____________________________________________   LABS (all labs ordered are listed, but only abnormal results are displayed)  Labs Reviewed - No data to display ____________________________________________  EKG   ____________________________________________  RADIOLOGY  No acute findings on finger x-ray. I, Sable Feil, personally viewed and evaluated these images (plain  radiographs) as part of my medical decision making, as well as reviewing the written report by the radiologist.  ____________________________________________   PROCEDURES  Procedure(s) performed: None  Critical Care performed: No  ____________________________________________   INITIAL IMPRESSION / ASSESSMENT AND PLAN / ED COURSE  Pertinent labs & imaging results that were available during my care of the patient were reviewed by me and considered in my medical decision making (see chart for details).  Rain third digit right hand. This x-ray findings with patient. Patient placed in finger splint and advised take naproxen as directed. Follow-up family doctor as needed. ____________________________________________   FINAL CLINICAL IMPRESSION(S) / ED DIAGNOSES  Final diagnoses:  Sprain, finger, initial encounter      Sable Feil, PA-C 11/12/15 Kimberly, MD 11/12/15 [phone]

## 2015-11-30 ENCOUNTER — Telehealth: Payer: Self-pay

## 2015-11-30 NOTE — Telephone Encounter (Signed)
[facility]  They called and told patient that he needed a authorization before he could be seen on Wednesday.

## 2015-11-30 NOTE — Telephone Encounter (Signed)
[employer] Authorization was already authorized 11/02/2015. Called [facility]. They already had it on record, and said it was their mistake.   Authorization [id number]

## 2015-12-02 DIAGNOSIS — D485 Neoplasm of uncertain behavior of skin: Secondary | ICD-10-CM | POA: Diagnosis not present

## 2015-12-02 DIAGNOSIS — D2261 Melanocytic nevi of right upper limb, including shoulder: Secondary | ICD-10-CM | POA: Diagnosis not present

## 2015-12-02 DIAGNOSIS — C44319 Basal cell carcinoma of skin of other parts of face: Secondary | ICD-10-CM | POA: Diagnosis not present

## 2015-12-02 DIAGNOSIS — D2271 Melanocytic nevi of right lower limb, including hip: Secondary | ICD-10-CM | POA: Diagnosis not present

## 2015-12-02 DIAGNOSIS — X32XXXA Exposure to sunlight, initial encounter: Secondary | ICD-10-CM | POA: Diagnosis not present

## 2015-12-02 DIAGNOSIS — L570 Actinic keratosis: Secondary | ICD-10-CM | POA: Diagnosis not present

## 2015-12-02 DIAGNOSIS — L821 Other seborrheic keratosis: Secondary | ICD-10-CM | POA: Diagnosis not present

## 2015-12-02 DIAGNOSIS — D225 Melanocytic nevi of trunk: Secondary | ICD-10-CM | POA: Diagnosis not present

## 2015-12-09 DIAGNOSIS — R1030 Lower abdominal pain, unspecified: Secondary | ICD-10-CM | POA: Diagnosis not present

## 2015-12-09 DIAGNOSIS — I10 Essential (primary) hypertension: Secondary | ICD-10-CM | POA: Insufficient documentation

## 2015-12-09 DIAGNOSIS — Z791 Long term (current) use of non-steroidal anti-inflammatories (NSAID): Secondary | ICD-10-CM | POA: Insufficient documentation

## 2015-12-09 DIAGNOSIS — K5732 Diverticulitis of large intestine without perforation or abscess without bleeding: Secondary | ICD-10-CM | POA: Diagnosis not present

## 2015-12-09 DIAGNOSIS — R109 Unspecified abdominal pain: Secondary | ICD-10-CM | POA: Diagnosis present

## 2015-12-09 DIAGNOSIS — Z87891 Personal history of nicotine dependence: Secondary | ICD-10-CM | POA: Diagnosis not present

## 2015-12-09 DIAGNOSIS — Z7982 Long term (current) use of aspirin: Secondary | ICD-10-CM | POA: Diagnosis not present

## 2015-12-09 DIAGNOSIS — Z79899 Other long term (current) drug therapy: Secondary | ICD-10-CM | POA: Diagnosis not present

## 2015-12-09 LAB — COMPREHENSIVE METABOLIC PANEL
ALK PHOS: 48 U/L (ref 38–126)
ALT: 19 U/L (ref 17–63)
AST: 26 U/L (ref 15–41)
Albumin: 3.9 g/dL (ref 3.5–5.0)
Anion gap: 5 (ref 5–15)
BUN: 21 mg/dL — AB (ref 6–20)
CALCIUM: 8.8 mg/dL — AB (ref 8.9–10.3)
CO2: 23 mmol/L (ref 22–32)
CREATININE: 1 mg/dL (ref 0.61–1.24)
Chloride: 106 mmol/L (ref 101–111)
GFR calc Af Amer: >60 mL/min (ref >60)
GFR calc non Af Amer: >60 mL/min (ref >60)
GLUCOSE: 104 mg/dL — AB (ref 65–99)
Potassium: 4.3 mmol/L (ref 3.5–5.1)
SODIUM: 134 mmol/L — AB (ref 135–145)
Total Bilirubin: 0.8 mg/dL (ref 0.3–1.2)
Total Protein: 7.9 g/dL (ref 6.5–8.1)

## 2015-12-09 LAB — URINALYSIS COMPLETE WITH MICROSCOPIC (ARMC ONLY)
Bacteria, UA: NONE SEEN
Bilirubin Urine: NEGATIVE
GLUCOSE, UA: NEGATIVE mg/dL
Hgb urine dipstick: NEGATIVE
KETONES UR: NEGATIVE mg/dL
Leukocytes, UA: NEGATIVE
Nitrite: NEGATIVE
Protein, ur: NEGATIVE mg/dL
SPECIFIC GRAVITY, URINE: 1.02 (ref 1.005–1.030)
SQUAMOUS EPITHELIAL / LPF: NONE SEEN
pH: 6 (ref 5.0–8.0)

## 2015-12-09 LAB — CBC
HCT: 44.3 % (ref 40.0–52.0)
Hemoglobin: 15 g/dL (ref 13.0–18.0)
MCH: 31.7 pg (ref 26.0–34.0)
MCHC: 33.9 g/dL (ref 32.0–36.0)
MCV: 93.7 fL (ref 80.0–100.0)
PLATELETS: 274 10*3/uL (ref 150–440)
RBC: 4.73 MIL/uL (ref 4.40–5.90)
RDW: 13.4 % (ref 11.5–14.5)
WBC: 13.9 10*3/uL — ABNORMAL HIGH (ref 3.8–10.6)

## 2015-12-09 LAB — LIPASE, BLOOD: Lipase: 21 U/L (ref 11–51)

## 2015-12-09 NOTE — ED Notes (Signed)
Patient ambulatory to triage with steady gait, without difficulty or distress noted; pt reports mid lower abd pain last few days, intermittent, "knife-like"; denies any accomp symptoms; denies hx of same

## 2015-12-10 ENCOUNTER — Emergency Department: Payer: Commercial Managed Care - HMO

## 2015-12-10 ENCOUNTER — Emergency Department
Admission: EM | Admit: 2015-12-10 | Discharge: 2015-12-10 | Disposition: A | Discharge status: Home / Self Care | Payer: Commercial Managed Care - HMO | Attending: Emergency Medicine | Admitting: Emergency Medicine

## 2015-12-10 DIAGNOSIS — R1030 Lower abdominal pain, unspecified: Secondary | ICD-10-CM

## 2015-12-10 DIAGNOSIS — K5732 Diverticulitis of large intestine without perforation or abscess without bleeding: Secondary | ICD-10-CM | POA: Diagnosis not present

## 2015-12-10 HISTORY — DX: Malignant (primary) neoplasm, unspecified: C80.1

## 2015-12-10 MED ORDER — HYDROCODONE-ACETAMINOPHEN 5-325 MG PO TABS: 1.0000 | ORAL_TABLET | Freq: Four times a day (QID) | ORAL | Status: DC | PRN

## 2015-12-10 MED ORDER — IOHEXOL 240 MG/ML SOLN
25.0000 mL | Freq: Once | INTRAMUSCULAR | Status: AC | PRN
  Administered 2015-12-10: 25 mL via INTRAVENOUS

## 2015-12-10 MED ORDER — METRONIDAZOLE 500 MG PO TABS
500.0000 mg | ORAL_TABLET | Freq: Once | ORAL | Status: AC
  Administered 2015-12-10: 500 mg via ORAL
  Filled 2015-12-10: qty 1

## 2015-12-10 MED ORDER — ONDANSETRON 4 MG PO TBDP: 4.0000 mg | ORAL_TABLET | Freq: Three times a day (TID) | ORAL | Status: DC | PRN

## 2015-12-10 MED ORDER — IOPAMIDOL (ISOVUE-300) INJECTION 61%
100.0000 mL | Freq: Once | INTRAVENOUS | Status: AC | PRN
  Administered 2015-12-10: 100 mL via INTRAVENOUS

## 2015-12-10 MED ORDER — METRONIDAZOLE 500 MG PO TABS: 500.0000 mg | ORAL_TABLET | Freq: Two times a day (BID) | ORAL | Status: DC

## 2015-12-10 MED ORDER — MORPHINE SULFATE (PF) 4 MG/ML IV SOLN
4.0000 mg | Freq: Once | INTRAVENOUS | Status: AC
  Administered 2015-12-10: 4 mg via INTRAVENOUS
  Filled 2015-12-10: qty 1

## 2015-12-10 MED ORDER — ONDANSETRON HCL 4 MG/2ML IJ SOLN
4.0000 mg | Freq: Once | INTRAMUSCULAR | Status: AC
  Administered 2015-12-10: 4 mg via INTRAVENOUS
  Filled 2015-12-10: qty 2

## 2015-12-10 MED ORDER — CIPROFLOXACIN HCL 500 MG PO TABS: 500.0000 mg | ORAL_TABLET | Freq: Two times a day (BID) | ORAL | Status: DC

## 2015-12-10 MED ORDER — CIPROFLOXACIN HCL 500 MG PO TABS
500.0000 mg | ORAL_TABLET | Freq: Once | ORAL | Status: AC
  Administered 2015-12-10: 500 mg via ORAL
  Filled 2015-12-10: qty 1

## 2015-12-10 NOTE — ED Notes (Signed)

## 2015-12-10 NOTE — ED Notes (Signed)
Pt presents to ED c/o mid lower abdominal pain x 1.5 weeks. Pt described pain as stabbing. Pt has umbilical hernia. Pt denies urinary symptoms, denies chest pain, shortness of breath or other complaints. Pt alert and oriented x 4, no increased work in breathing noted.

## 2015-12-10 NOTE — ED Notes (Signed)
CT notified pt finished drinking contrast  

## 2015-12-10 NOTE — ED Notes (Signed)
MD at bedside. 

## 2015-12-10 NOTE — ED Provider Notes (Signed)
[facility] Emergency Department Provider Note  ____________________________________________  Time seen: Approximately 2:45 AM  I have reviewed the triage vital signs and the nursing notes.   HISTORY  Chief Complaint Abdominal Pain    HPI Mitchell [name] is a 60 y.o. male who presents to the ED from home with a chief complaint of abdominal pain. Patient reports intermittent low abdominal pain for the past week. Describes pain as nonradiating and stabbing. Denies associated fever, chills, chest pain, shortness of breath, nausea, vomiting, diarrhea, dysuria, testicular swelling or tenderness. Denies recent travel or trauma. Last bowel movement approximately 5 hours ago which was normal for patient. Nothing makes his symptoms better or worse   Past Medical History  Diagnosis Date  . Hepatitis C     Tested positive, treated now told that he does not have it  . Anxiety   . Hypertension   . Depression   . IFG (impaired fasting glucose)   . Schizophrenia ([place])   . Insomnia   . Hydrocele   . Epididymitis   . Kidney stone   . Cancer [facility])     skin cancer    Patient Active Problem List   Diagnosis Date Noted  . Swollen testicle 08/14/2015  . BPH with obstruction/lower urinary tract symptoms 08/14/2015  . Hyperlipidemia 07/22/2015  . Hypothyroidism 07/17/2015  . BPH (benign prostatic hyperplasia) 07/16/2015  . Scrotal swelling 07/16/2015  . Hydrocele, right 07/08/2015  . Drug-induced erectile dysfunction 07/08/2015  . IFG (impaired fasting glucose)   . Schizophrenia ([place])   . Insomnia   . Anxiety   . Hypertension   . Depression   . Hepatitis C     Past Surgical History  Procedure Laterality Date  . Ankle fracture surgery    . Colonoscopy  2010  . Hernia repair  2011    X 2    Current Outpatient Rx  Name  Route  Sig  Dispense  Refill  . aspirin EC 81 MG tablet   Oral   Take 1 tablet (81 mg total) by mouth daily.         . cetirizine  (ZYRTEC) 5 MG tablet   Oral   Take 5 mg by mouth daily.         . clonazePAM (KLONOPIN) 0.5 MG tablet   Oral   Take 0.5 mg by mouth 3 (three) times daily as needed for anxiety.         . haloperidol (HALDOL) 1 MG tablet               . levothyroxine (SYNTHROID, LEVOTHROID) 50 MCG tablet   Oral   Take 1 tablet (50 mcg total) by mouth daily before breakfast.   30 tablet   12   . lisinopril (PRINIVIL,ZESTRIL) 5 MG tablet   Oral   Take 1 tablet (5 mg total) by mouth daily.   90 tablet   1   . naproxen (NAPROSYN) 500 MG tablet   Oral   Take 1 tablet (500 mg total) by mouth 2 (two) times daily with a meal.   20 tablet   00   . rosuvastatin (CRESTOR) 10 MG tablet      TAKE 1 TABLET BY MOUTH ONCE DAILY   30 tablet   6   . sildenafil (VIAGRA) 100 MG tablet   Oral   Take 1 tablet (100 mg total) by mouth daily as needed for erectile dysfunction.   30 tablet   6   .  zolpidem (AMBIEN) 10 MG tablet                 Allergies Atorvastatin and Codeine  Family History  Problem Relation Age of Onset  . Diabetes Mother   . Alzheimer's disease Father   . Diabetes Brother   . Diabetes Maternal Grandfather     Social History Social History  Substance Use Topics  . Smoking status: Former Smoker    Quit date: 07/06/1995  . Smokeless tobacco: Former Systems developer  . Alcohol Use: No     Comment: remote history, quit 20+ years ago    Review of Systems  Constitutional: No fever/chills. Eyes: No visual changes. ENT: No sore throat. Cardiovascular: Denies chest pain. Respiratory: Denies shortness of breath. Gastrointestinal: Positive for abdominal pain.  No nausea, no vomiting.  No diarrhea.  No constipation. Genitourinary: Negative for dysuria. Musculoskeletal: Negative for back pain. Skin: Negative for rash. Neurological: Negative for headaches, focal weakness or numbness.  10-point ROS otherwise  negative.  ____________________________________________   PHYSICAL EXAM:  VITAL SIGNS: ED Triage Vitals  Enc Vitals Group     BP 12/09/15 2256 151/92 mmHg     Pulse Rate 12/09/15 2256 73     Resp 12/09/15 2256 18     Temp 12/09/15 2256 97.5 F (36.4 C)     Temp Source 12/09/15 2256 Oral     SpO2 12/09/15 2256 98 %     Weight 12/09/15 2256 165 lb (74.844 kg)     Height 12/09/15 2256 5\' 2"  (1.575 m)     Head Cir --      Peak Flow --      Pain Score 12/09/15 2257 5     Pain Loc --      Pain Edu? --      Excl. in [place]? --     Constitutional: Alert and oriented. Well appearing and in no acute distress. Eyes: Conjunctivae are normal. PERRL. EOMI. Head: Atraumatic. Nose: No congestion/rhinnorhea. Mouth/Throat: Mucous membranes are moist.  Oropharynx non-erythematous. Neck: No stridor.   Cardiovascular: Normal rate, regular rhythm. Grossly normal heart sounds.  Good peripheral circulation. Respiratory: Normal respiratory effort.  No retractions. Lungs CTAB. Gastrointestinal: Soft and mildly tender to palpation in suprapubic area without rebound or guarding. Mild distention. No abdominal bruits. No CVA tenderness. Genitourinary: Circumcised male; no penile discharge. Bilaterally distended testicles without swelling or tenderness. Strong cremaster reflexes bilaterally. Musculoskeletal: No lower extremity tenderness nor edema.  No joint effusions. Neurologic:  Normal speech and language. No gross focal neurologic deficits are appreciated. No gait instability. Skin:  Skin is warm, dry and intact. No rash noted. Psychiatric: Mood and affect are normal. Speech and behavior are normal.  ____________________________________________   LABS (all labs ordered are listed, but only abnormal results are displayed)  Labs Reviewed  COMPREHENSIVE METABOLIC PANEL - Abnormal; Notable for the following:    Sodium 134 (*)    Glucose, Bld 104 (*)    BUN 21 (*)    Calcium 8.8 (*)    All other  components within normal limits  CBC - Abnormal; Notable for the following:    WBC 13.9 (*)    All other components within normal limits  URINALYSIS COMPLETEWITH MICROSCOPIC ([facility] ONLY) - Abnormal; Notable for the following:    Color, Urine YELLOW (*)    APPearance CLEAR (*)    All other components within normal limits  LIPASE, BLOOD   ____________________________________________  EKG  None ____________________________________________  RADIOLOGY  CT abdomen and  pelvis with contrast interpreted per Dr. Quintella Reichert: Sigmoid diverticulitis. No abscess. ____________________________________________   PROCEDURES  Procedure(s) performed: None  Critical Care performed: No  ____________________________________________   INITIAL IMPRESSION / ASSESSMENT AND PLAN / ED COURSE  Pertinent labs & imaging results that were available during my care of the patient were reviewed by me and considered in my medical decision making (see chart for details).  60 year old male who presents with lower abdominal pain. Laboratory and urinalysis results remarkable for mild leukocytosis. Will administer IV analgesia with antiemetic and proceed with CT scan to evaluate for intra-abdominal pathology.  ----------------------------------------- 4:08 AM on 12/10/2015 -----------------------------------------  Patient improved. Updated patient of CT imaging results which demonstrated sigmoid diverticulitis without abscess. Will place patient on Cipro and Flagyl; prescriptions for pain and nausea medicines and follow-up with his PCP. Strict return precautions given. Patient verbalizes understanding and agrees with plan of care. ____________________________________________   FINAL CLINICAL IMPRESSION(S) / ED DIAGNOSES  Final diagnoses:  Lower abdominal pain  Diverticulitis of large intestine without perforation or abscess without bleeding      Paulette Blanch, MD 12/10/15 [phone]

## 2015-12-10 NOTE — Discharge Instructions (Signed)
1. Take antibiotics as prescribed (Cipro/Flagyl 500 mg twice daily 7 days). 2. You may take pain and nausea medicines as needed (Norco/Zofran #20). 3. Return to the ER for worsening symptoms, persistent vomiting, fever or other concerns.  Abdominal Pain, Adult Many things can cause abdominal pain. Usually, abdominal pain is not caused by a disease and will improve without treatment. It can often be observed and treated at home. Your health care provider will do a physical exam and possibly order blood tests and X-rays to help determine the seriousness of your pain. However, in many cases, more time must pass before a clear cause of the pain can be found. Before that point, your health care provider may not know if you need more testing or further treatment. HOME CARE INSTRUCTIONS Monitor your abdominal pain for any changes. The following actions may help to alleviate any discomfort you are experiencing:  Only take over-the-counter or prescription medicines as directed by your health care provider.  Do not take laxatives unless directed to do so by your health care provider.  Try a clear liquid diet (broth, tea, or water) as directed by your health care provider. Slowly move to a bland diet as tolerated. SEEK MEDICAL CARE IF:  You have unexplained abdominal pain.  You have abdominal pain associated with nausea or diarrhea.  You have pain when you urinate or have a bowel movement.  You experience abdominal pain that wakes you in the night.  You have abdominal pain that is worsened or improved by eating food.  You have abdominal pain that is worsened with eating fatty foods.  You have a fever. SEEK IMMEDIATE MEDICAL CARE IF:  Your pain does not go away within 2 hours.  You keep throwing up (vomiting).  Your pain is felt only in portions of the abdomen, such as the right side or the left lower portion of the abdomen.  You pass bloody or black tarry stools. MAKE SURE  YOU:  Understand these instructions.  Will watch your condition.  Will get help right away if you are not doing well or get worse.   This information is not intended to replace advice given to you by your health care provider. Make sure you discuss any questions you have with your health care provider.   Document Released: 06/15/2005 Document Revised: 05/27/2015 Document Reviewed: 05/15/2013 Elsevier Interactive Patient Education 2016 [employer].  Diverticulitis Diverticulitis is inflammation or infection of small pouches in your colon that form when you have a condition called diverticulosis. The pouches in your colon are called diverticula. Your colon, or large intestine, is where water is absorbed and stool is formed. Complications of diverticulitis can include:  Bleeding.  Severe infection.  Severe pain.  Perforation of your colon.  Obstruction of your colon. CAUSES  Diverticulitis is caused by bacteria. Diverticulitis happens when stool becomes trapped in diverticula. This allows bacteria to grow in the diverticula, which can lead to inflammation and infection. RISK FACTORS People with diverticulosis are at risk for diverticulitis. Eating a diet that does not include enough fiber from fruits and vegetables may make diverticulitis more likely to develop. SYMPTOMS  Symptoms of diverticulitis may include:  Abdominal pain and tenderness. The pain is normally located on the left side of the abdomen, but may occur in other areas.  Fever and chills.  Bloating.  Cramping.  Nausea.  Vomiting.  Constipation.  Diarrhea.  Blood in your stool. DIAGNOSIS  Your health care provider will ask you about your medical  history and do a physical exam. You may need to have tests done because many medical conditions can cause the same symptoms as diverticulitis. Tests may include:  Blood tests.  Urine tests.  Imaging tests of the abdomen, including X-rays and CT scans. When  your condition is under control, your health care provider may recommend that you have a colonoscopy. A colonoscopy can show how severe your diverticula are and whether something else is causing your symptoms. TREATMENT  Most cases of diverticulitis are mild and can be treated at home. Treatment may include:  Taking over-the-counter pain medicines.  Following a clear liquid diet.  Taking antibiotic medicines by mouth for 7-10 days. More severe cases may be treated at a hospital. Treatment may include:  Not eating or drinking.  Taking prescription pain medicine.  Receiving antibiotic medicines through an IV tube.  Receiving fluids and nutrition through an IV tube.  Surgery. HOME CARE INSTRUCTIONS   Follow your health care provider's instructions carefully.  Follow a full liquid diet or other diet as directed by your health care provider. After your symptoms improve, your health care provider may tell you to change your diet. He or she may recommend you eat a high-fiber diet. Fruits and vegetables are good sources of fiber. Fiber makes it easier to pass stool.  Take fiber supplements or probiotics as directed by your health care provider.  Only take medicines as directed by your health care provider.  Keep all your follow-up appointments. SEEK MEDICAL CARE IF:   Your pain does not improve.  You have a hard time eating food.  Your bowel movements do not return to normal. SEEK IMMEDIATE MEDICAL CARE IF:   Your pain becomes worse.  Your symptoms do not get better.  Your symptoms suddenly get worse.  You have a fever.  You have repeated vomiting.  You have bloody or black, tarry stools. MAKE SURE YOU:   Understand these instructions.  Will watch your condition.  Will get help right away if you are not doing well or get worse.   This information is not intended to replace advice given to you by your health care provider. Make sure you discuss any questions you have  with your health care provider.   Document Released: 06/15/2005 Document Revised: 09/10/2013 Document Reviewed: 07/31/2013 Elsevier Interactive Patient Education [employer].

## 2015-12-11 ENCOUNTER — Emergency Department
Admission: EM | Admit: 2015-12-11 | Discharge: 2015-12-11 | Disposition: A | Discharge status: Home / Self Care | Payer: Commercial Managed Care - HMO | Attending: Emergency Medicine | Admitting: Emergency Medicine

## 2015-12-11 ENCOUNTER — Encounter: Payer: Self-pay | Admitting: Emergency Medicine

## 2015-12-11 ENCOUNTER — Emergency Department: Payer: Commercial Managed Care - HMO

## 2015-12-11 DIAGNOSIS — R1032 Left lower quadrant pain: Secondary | ICD-10-CM | POA: Diagnosis present

## 2015-12-11 DIAGNOSIS — Z792 Long term (current) use of antibiotics: Secondary | ICD-10-CM | POA: Diagnosis not present

## 2015-12-11 DIAGNOSIS — Z79899 Other long term (current) drug therapy: Secondary | ICD-10-CM | POA: Diagnosis not present

## 2015-12-11 DIAGNOSIS — Z791 Long term (current) use of non-steroidal anti-inflammatories (NSAID): Secondary | ICD-10-CM | POA: Diagnosis not present

## 2015-12-11 DIAGNOSIS — N50811 Right testicular pain: Secondary | ICD-10-CM

## 2015-12-11 DIAGNOSIS — Z7982 Long term (current) use of aspirin: Secondary | ICD-10-CM | POA: Diagnosis not present

## 2015-12-11 DIAGNOSIS — N433 Hydrocele, unspecified: Secondary | ICD-10-CM | POA: Insufficient documentation

## 2015-12-11 DIAGNOSIS — Z87891 Personal history of nicotine dependence: Secondary | ICD-10-CM | POA: Diagnosis not present

## 2015-12-11 DIAGNOSIS — I10 Essential (primary) hypertension: Secondary | ICD-10-CM | POA: Insufficient documentation

## 2015-12-11 DIAGNOSIS — N503 Cyst of epididymis: Secondary | ICD-10-CM | POA: Diagnosis not present

## 2015-12-11 NOTE — ED Provider Notes (Signed)
[facility] Emergency Department Provider Note  Time seen: 7:45 AM  I have reviewed the triage vital signs and the nursing notes.   HISTORY  Chief Complaint Groin Pain    HPI Mitchell [name] is a 60 y.o. male with a past medical history of hepatitis, anxiety, hypertension, schizophrenia, hydrocele, presents the emergency department with right testicular pain. Patient was seen in the emergency department approximately 36 hours ago diagnosed with sigmoid diverticulitis. Patient is taking his antibiotics however he states over the past 24 hours or so he has began experiencing right testicular pain. Denies any nausea, vomiting, states normal bowel movements, denies constipation. Patient is able to pass flatus. Denies fever. Denies black or bloody stool. Patient is having left lower quadrant pain which she states is unchanged since his diagnosis of diverticulitis.     Past Medical History  Diagnosis Date  . Hepatitis C     Tested positive, treated now told that he does not have it  . Anxiety   . Hypertension   . Depression   . IFG (impaired fasting glucose)   . Schizophrenia ([place])   . Insomnia   . Hydrocele   . Epididymitis   . Kidney stone   . Cancer [facility])     skin cancer    Patient Active Problem List   Diagnosis Date Noted  . Swollen testicle 08/14/2015  . BPH with obstruction/lower urinary tract symptoms 08/14/2015  . Hyperlipidemia 07/22/2015  . Hypothyroidism 07/17/2015  . BPH (benign prostatic hyperplasia) 07/16/2015  . Scrotal swelling 07/16/2015  . Hydrocele, right 07/08/2015  . Drug-induced erectile dysfunction 07/08/2015  . IFG (impaired fasting glucose)   . Schizophrenia ([place])   . Insomnia   . Anxiety   . Hypertension   . Depression   . Hepatitis C     Past Surgical History  Procedure Laterality Date  . Ankle fracture surgery    . Colonoscopy  2010  . Hernia repair  2011    X 2    Current Outpatient Rx  Name  Route  Sig   Dispense  Refill  . aspirin EC 81 MG tablet   Oral   Take 1 tablet (81 mg total) by mouth daily.         . cetirizine (ZYRTEC) 5 MG tablet   Oral   Take 5 mg by mouth daily.         . ciprofloxacin (CIPRO) 500 MG tablet   Oral   Take 1 tablet (500 mg total) by mouth 2 (two) times daily.   14 tablet   0   . clonazePAM (KLONOPIN) 0.5 MG tablet   Oral   Take 0.5 mg by mouth 3 (three) times daily as needed for anxiety.         . haloperidol (HALDOL) 1 MG tablet               . HYDROcodone-acetaminophen (NORCO) 5-325 MG tablet   Oral   Take 1 tablet by mouth every 6 (six) hours as needed for moderate pain.   20 tablet   0   . levothyroxine (SYNTHROID, LEVOTHROID) 50 MCG tablet   Oral   Take 1 tablet (50 mcg total) by mouth daily before breakfast.   30 tablet   12   . lisinopril (PRINIVIL,ZESTRIL) 5 MG tablet   Oral   Take 1 tablet (5 mg total) by mouth daily.   90 tablet   1   . metroNIDAZOLE (FLAGYL) 500 MG tablet  Oral   Take 1 tablet (500 mg total) by mouth 2 (two) times daily.   14 tablet   0   . naproxen (NAPROSYN) 500 MG tablet   Oral   Take 1 tablet (500 mg total) by mouth 2 (two) times daily with a meal.   20 tablet   00   . ondansetron (ZOFRAN ODT) 4 MG disintegrating tablet   Oral   Take 1 tablet (4 mg total) by mouth every 8 (eight) hours as needed for nausea or vomiting.   20 tablet   0   . rosuvastatin (CRESTOR) 10 MG tablet      TAKE 1 TABLET BY MOUTH ONCE DAILY   30 tablet   6   . sildenafil (VIAGRA) 100 MG tablet   Oral   Take 1 tablet (100 mg total) by mouth daily as needed for erectile dysfunction.   30 tablet   6   . zolpidem (AMBIEN) 10 MG tablet                 Allergies Atorvastatin and Codeine  Family History  Problem Relation Age of Onset  . Diabetes Mother   . Alzheimer's disease Father   . Diabetes Brother   . Diabetes Maternal Grandfather     Social History Social History  Substance Use Topics   . Smoking status: Former Smoker    Quit date: 07/06/1995  . Smokeless tobacco: Former Systems developer  . Alcohol Use: No     Comment: remote history, quit 20+ years ago    Review of Systems Constitutional: Negative for fever. Cardiovascular: Negative for chest pain. Respiratory: Negative for shortness of breath. Gastrointestinal: Positive for lower abdominal pain. Negative for nausea, vomiting, diarrhea. Genitourinary: Negative for dysuria Neurological: Negative for headache 10-point ROS otherwise negative.  ____________________________________________   PHYSICAL EXAM:  VITAL SIGNS: ED Triage Vitals  Enc Vitals Group     BP 12/11/15 0600 135/82 mmHg     Pulse Rate 12/11/15 0600 69     Resp 12/11/15 0600 18     Temp 12/11/15 0600 97.8 F (36.6 C)     Temp Source 12/11/15 0600 Oral     SpO2 12/11/15 0600 92 %     Weight 12/11/15 0600 160 lb (72.576 kg)     Height 12/11/15 0600 5\' 2"  (1.575 m)     Head Cir --      Peak Flow --      Pain Score 12/11/15 0601 7     Pain Loc --      Pain Edu? --      Excl. in [place]? --     Constitutional: Alert and oriented. Well appearing and in no distress. Eyes: Normal exam ENT   Head: Normocephalic and atraumatic.   Mouth/Throat: Mucous membranes are moist. Cardiovascular: Normal rate, regular rhythm. No murmur Respiratory: Normal respiratory effort without tachypnea nor retractions. Breath sounds are clear  Gastrointestinal: Soft, mild lower abdominal tenderness palpation. No rebound or guarding. No distention. Genitourinary: Normal-appearing external examination. On palpation the patient has mild to moderate right testicular tenderness. Normal left testicle. No swelling noted. No hernia or mass noted. No skin color changes or signs of infection.  Musculoskeletal: Nontender with normal range of motion in all extremities. Neurologic:  Normal speech and language. No gross focal neurologic deficits Skin:  Skin is warm, dry and intact.   Psychiatric: Mood and affect are normal.   ____________________________________________   RADIOLOGY  Ultrasound shows bilateral hydrocele and small left epididymal  cyst.  ____________________________________________    INITIAL IMPRESSION / ASSESSMENT AND PLAN / ED COURSE  Pertinent labs & imaging results that were available during my care of the patient were reviewed by me and considered in my medical decision making (see chart for details).  Patient presents the emergency department with right groin pain, recently diagnosed with diverticulitis on ciprofloxacin and Flagyl. Overall normal exam besides mild to moderate right testicular tenderness. We'll proceed with an ultrasound to further evaluate. According to the patient's records he has a history of both epididymitis and hydrocele the past. Possibly could be   an exacerbation of a previous hydrocele due to increased bowel movements associated with his diverticulitis.   Ultrasound consistent with small bilateral hydroceles, no other abnormality noted. Patient appears overall well. Able to pass flatus, no signs of obstruction or hernia on exam. Patient states he has had this happen several times in the past, and he is not sure what is causing the discomfort. We will discharge the patient primary care follow-up, I discussed return precautions with the patient. ____________________________________________   FINAL CLINICAL IMPRESSION(S) / ED DIAGNOSES  Right groin pain Hydrocele  Harvest Dark, MD 12/11/15 [phone]

## 2015-12-11 NOTE — ED Notes (Signed)
Patient transported to Ultrasound 

## 2015-12-11 NOTE — Discharge Instructions (Signed)
Hydrocele, Adult  A hydrocele is a collection of fluid in the loose pouch of skin that holds the testicles (scrotum). Usually, it affects only one testicle.  CAUSES  This condition may be caused by:  · An injury to the scrotum.  · An infection.  · A tumor or cancer of the testicle.  · Twisting of a testicle.  · Decreased blood flow to the scrotum.  SYMPTOMS  A hydrocele feels like a water-filled balloon. It may also feel heavy. A hydrocele can cause:  · Swelling of the scrotum. The swelling may decrease when you lie down.  · Swelling of the groin.  · Mild discomfort in the scrotum.  · Pain. This can develop if the hydrocele was caused by infection or twisting.  DIAGNOSIS  This condition may be diagnosed with a medical history, physical exam, and imaging tests. You may also have blood and urine tests to check for infection.  TREATMENT  Treatment may include:  · Watching and waiting, particularly if the hydrocele causes no symptoms.  · Treatment of the underlying condition. This may include using antibiotic medicine.  · Surgery to drain the fluid. Some surgical options include:    Needle aspiration. For this procedure, a needle is used to drain fluid.    Hydrocelectomy. For this procedure, an incision is made in the scrotum to remove the fluid sac.  HOME CARE INSTRUCTIONS  · Keep all follow-up visits as told by your health care provider. This is important.  · Watch the hydrocele for any changes.  · Take over-the-counter and prescription medicines only as told by your health care provider.  · If you were prescribed an antibiotic medicine, use it as told by your health care provider. Do not stop using the antibiotic even if your condition improves.  SEEK MEDICAL CARE IF:  · The swelling in your scrotum or groin gets worse.  · The hydrocele becomes red, firm, tender to the touch, or painful.  · You notice any changes in the hydrocele.  · You have a fever.     This information is not intended to replace advice given to  you by your health care provider. Make sure you discuss any questions you have with your health care provider.     Document Released: 02/23/2010 Document Revised: 01/20/2015 Document Reviewed: 09/01/2014  Elsevier Interactive Patient Education ©2016 [employer]

## 2015-12-11 NOTE — ED Notes (Signed)
Pt in with co swelling to groin since yest, was dx with diverticulitis 2 days ago and started on antibiotics.

## 2015-12-16 ENCOUNTER — Ambulatory Visit: Payer: Medicare HMO | Admitting: Urology

## 2015-12-22 ENCOUNTER — Other Ambulatory Visit: Payer: Self-pay | Admitting: Family Medicine

## 2015-12-22 DIAGNOSIS — C44319 Basal cell carcinoma of skin of other parts of face: Secondary | ICD-10-CM | POA: Diagnosis not present

## 2016-01-01 ENCOUNTER — Encounter: Payer: Self-pay | Admitting: Emergency Medicine

## 2016-01-01 ENCOUNTER — Emergency Department
Admission: EM | Admit: 2016-01-01 | Discharge: 2016-01-01 | Disposition: A | Discharge status: Home / Self Care | Payer: Commercial Managed Care - HMO | Attending: Emergency Medicine | Admitting: Emergency Medicine

## 2016-01-01 ENCOUNTER — Emergency Department: Payer: Commercial Managed Care - HMO

## 2016-01-01 DIAGNOSIS — I10 Essential (primary) hypertension: Secondary | ICD-10-CM | POA: Insufficient documentation

## 2016-01-01 DIAGNOSIS — K5900 Constipation, unspecified: Secondary | ICD-10-CM | POA: Insufficient documentation

## 2016-01-01 DIAGNOSIS — E039 Hypothyroidism, unspecified: Secondary | ICD-10-CM | POA: Diagnosis not present

## 2016-01-01 DIAGNOSIS — N401 Enlarged prostate with lower urinary tract symptoms: Secondary | ICD-10-CM | POA: Insufficient documentation

## 2016-01-01 DIAGNOSIS — Z87891 Personal history of nicotine dependence: Secondary | ICD-10-CM | POA: Insufficient documentation

## 2016-01-01 DIAGNOSIS — Z7982 Long term (current) use of aspirin: Secondary | ICD-10-CM | POA: Diagnosis not present

## 2016-01-01 DIAGNOSIS — Z79899 Other long term (current) drug therapy: Secondary | ICD-10-CM | POA: Diagnosis not present

## 2016-01-01 DIAGNOSIS — F329 Major depressive disorder, single episode, unspecified: Secondary | ICD-10-CM | POA: Insufficient documentation

## 2016-01-01 DIAGNOSIS — B1710 Acute hepatitis C without hepatic coma: Secondary | ICD-10-CM | POA: Insufficient documentation

## 2016-01-01 DIAGNOSIS — E785 Hyperlipidemia, unspecified: Secondary | ICD-10-CM | POA: Diagnosis not present

## 2016-01-01 DIAGNOSIS — R140 Abdominal distension (gaseous): Secondary | ICD-10-CM | POA: Diagnosis not present

## 2016-01-01 DIAGNOSIS — F209 Schizophrenia, unspecified: Secondary | ICD-10-CM | POA: Insufficient documentation

## 2016-01-01 DIAGNOSIS — R1032 Left lower quadrant pain: Secondary | ICD-10-CM | POA: Diagnosis not present

## 2016-01-01 DIAGNOSIS — Z85828 Personal history of other malignant neoplasm of skin: Secondary | ICD-10-CM | POA: Insufficient documentation

## 2016-01-01 LAB — URINALYSIS COMPLETE WITH MICROSCOPIC (ARMC ONLY)
BACTERIA UA: NONE SEEN
Bilirubin Urine: NEGATIVE
GLUCOSE, UA: NEGATIVE mg/dL
HGB URINE DIPSTICK: NEGATIVE
KETONES UR: NEGATIVE mg/dL
LEUKOCYTES UA: NEGATIVE
Nitrite: NEGATIVE
PROTEIN: NEGATIVE mg/dL
RBC / HPF: NONE SEEN RBC/hpf (ref 0–5)
SPECIFIC GRAVITY, URINE: 1.02 (ref 1.005–1.030)
pH: 5 (ref 5.0–8.0)

## 2016-01-01 LAB — COMPREHENSIVE METABOLIC PANEL
ALT: 25 U/L (ref 17–63)
AST: 29 U/L (ref 15–41)
Albumin: 4.1 g/dL (ref 3.5–5.0)
Alkaline Phosphatase: 41 U/L (ref 38–126)
Anion gap: 5 (ref 5–15)
BUN: 20 mg/dL (ref 6–20)
CHLORIDE: 108 mmol/L (ref 101–111)
CO2: 23 mmol/L (ref 22–32)
CREATININE: 1.16 mg/dL (ref 0.61–1.24)
Calcium: 9 mg/dL (ref 8.9–10.3)
Glucose, Bld: 103 mg/dL — ABNORMAL HIGH (ref 65–99)
POTASSIUM: 4.1 mmol/L (ref 3.5–5.1)
SODIUM: 136 mmol/L (ref 135–145)
Total Bilirubin: 0.7 mg/dL (ref 0.3–1.2)
Total Protein: 8 g/dL (ref 6.5–8.1)

## 2016-01-01 LAB — CBC
HEMATOCRIT: 43.2 % (ref 40.0–52.0)
HEMOGLOBIN: 15 g/dL (ref 13.0–18.0)
MCH: 31.8 pg (ref 26.0–34.0)
MCHC: 34.6 g/dL (ref 32.0–36.0)
MCV: 91.9 fL (ref 80.0–100.0)
Platelets: 281 10*3/uL (ref 150–440)
RBC: 4.7 MIL/uL (ref 4.40–5.90)
RDW: 13.4 % (ref 11.5–14.5)
WBC: 8 10*3/uL (ref 3.8–10.6)

## 2016-01-01 LAB — LIPASE, BLOOD: LIPASE: 28 U/L (ref 11–51)

## 2016-01-01 MED ORDER — DOCUSATE SODIUM 100 MG PO CAPS: 200.0000 mg | ORAL_CAPSULE | Freq: Two times a day (BID) | ORAL | Status: DC

## 2016-01-01 MED ORDER — SENNA 8.6 MG PO TABS: 2.0000 | ORAL_TABLET | Freq: Two times a day (BID) | ORAL | Status: DC

## 2016-01-01 NOTE — ED Notes (Signed)
Pt here with left sided abdominal pain started 3 days ago; reports hx of diverticulitis. Pt reports last BM was this morning.

## 2016-01-01 NOTE — ED Notes (Signed)
Patient c/o achy pain in the LUQ and LLQ for the past few days. Denies N/V/D. States that he has been lifting heavy buckets at work and thinks he may have pulled something.

## 2016-01-01 NOTE — ED Provider Notes (Signed)
[facility] Emergency Department Provider Note  ____________________________________________  Time seen: 4:30 PM  I have reviewed the triage vital signs and the nursing notes.   HISTORY  Chief Complaint Abdominal Pain    HPI Mitchell [name] is a 60 y.o. male who complains of left-sided abdominal pain gradually, colicky and crampy for the past 2-3 days. Normal BMs. No vomiting. No nausea. Normal oral intake. Has a history of diverticulitis but this feels different. Last time he needed antibiotics was a month ago. No hernia masses or scrotal swelling or pain. Pain is mild in intensity. No aggravating or alleviating factors.     Past Medical History  Diagnosis Date  . Hepatitis C     Tested positive, treated now told that he does not have it  . Anxiety   . Hypertension   . Depression   . IFG (impaired fasting glucose)   . Schizophrenia ([place])   . Insomnia   . Hydrocele   . Epididymitis   . Kidney stone   . Cancer [facility])     skin cancer     Patient Active Problem List   Diagnosis Date Noted  . Swollen testicle 08/14/2015  . BPH with obstruction/lower urinary tract symptoms 08/14/2015  . Hyperlipidemia 07/22/2015  . Hypothyroidism 07/17/2015  . BPH (benign prostatic hyperplasia) 07/16/2015  . Scrotal swelling 07/16/2015  . Hydrocele, right 07/08/2015  . Drug-induced erectile dysfunction 07/08/2015  . IFG (impaired fasting glucose)   . Schizophrenia ([place])   . Insomnia   . Anxiety   . Hypertension   . Depression   . Hepatitis C      Past Surgical History  Procedure Laterality Date  . Ankle fracture surgery    . Colonoscopy  2010  . Hernia repair  2011    X 2     Current Outpatient Rx  Name  Route  Sig  Dispense  Refill  . aspirin EC 81 MG tablet   Oral   Take 1 tablet (81 mg total) by mouth daily.         . benztropine (COGENTIN) 1 MG tablet   Oral   Take 1 tablet by mouth 2 (two) times daily.         . cetirizine (ZYRTEC)  5 MG tablet   Oral   Take 5 mg by mouth daily.         . ciprofloxacin (CIPRO) 500 MG tablet   Oral   Take 1 tablet (500 mg total) by mouth 2 (two) times daily.   14 tablet   0   . clonazePAM (KLONOPIN) 0.5 MG tablet   Oral   Take 0.5 mg by mouth 3 (three) times daily as needed for anxiety.         . docusate sodium (COLACE) 100 MG capsule   Oral   Take 2 capsules (200 mg total) by mouth 2 (two) times daily.   120 capsule   0   . haloperidol (HALDOL) 1 MG tablet               . HYDROcodone-acetaminophen (NORCO) 5-325 MG tablet   Oral   Take 1 tablet by mouth every 6 (six) hours as needed for moderate pain.   20 tablet   0   . levothyroxine (SYNTHROID, LEVOTHROID) 50 MCG tablet   Oral   Take 1 tablet (50 mcg total) by mouth daily before breakfast.   30 tablet   12   . lisinopril (PRINIVIL,ZESTRIL) 5 MG  tablet   Oral   Take 1 tablet (5 mg total) by mouth daily.   90 tablet   1   . metroNIDAZOLE (FLAGYL) 500 MG tablet   Oral   Take 1 tablet (500 mg total) by mouth 2 (two) times daily.   14 tablet   0   . naproxen (NAPROSYN) 500 MG tablet   Oral   Take 1 tablet (500 mg total) by mouth 2 (two) times daily with a meal.   20 tablet   00   . ondansetron (ZOFRAN ODT) 4 MG disintegrating tablet   Oral   Take 1 tablet (4 mg total) by mouth every 8 (eight) hours as needed for nausea or vomiting.   20 tablet   0   . rosuvastatin (CRESTOR) 10 MG tablet      TAKE 1 TABLET BY MOUTH ONCE DAILY   30 tablet   6   . senna (SENOKOT) 8.6 MG TABS tablet   Oral   Take 2 tablets (17.2 mg total) by mouth 2 (two) times daily.   120 each   0   . sildenafil (VIAGRA) 100 MG tablet   Oral   Take 1 tablet (100 mg total) by mouth daily as needed for erectile dysfunction.   30 tablet   6   . zolpidem (AMBIEN) 10 MG tablet                  Allergies Atorvastatin and Codeine   Family History  Problem Relation Age of Onset  . Diabetes Mother   .  Alzheimer's disease Father   . Diabetes Brother   . Diabetes Maternal Grandfather     Social History Social History  Substance Use Topics  . Smoking status: Former Smoker    Quit date: 07/06/1995  . Smokeless tobacco: Former Systems developer  . Alcohol Use: No     Comment: remote history, quit 20+ years ago    Review of Systems  Constitutional:   No fever or chills.  Eyes:   No vision changes.  ENT:   No sore throat. No rhinorrhea. Cardiovascular:   No chest pain. Respiratory:   No dyspnea or cough. Gastrointestinal:   Left-sided abdominal pain as above without vomiting or diarrhea.  No bloody stool. Genitourinary:   Negative for dysuria or difficulty urinating. Musculoskeletal:   Negative for focal pain or swelling Neurological:   Negative for headaches 10-point ROS otherwise negative.  ____________________________________________   PHYSICAL EXAM:  VITAL SIGNS: ED Triage Vitals  Enc Vitals Group     BP 01/01/16 1425 133/75 mmHg     Pulse Rate 01/01/16 1425 82     Resp 01/01/16 1425 16     Temp 01/01/16 1425 98.7 F (37.1 C)     Temp Source 01/01/16 1425 Oral     SpO2 01/01/16 1425 99 %     Weight 01/01/16 1424 160 lb (72.576 kg)     Height 01/01/16 1424 5\' 2"  (1.575 m)     Head Cir --      Peak Flow --      Pain Score 01/01/16 1424 5     Pain Loc --      Pain Edu? --      Excl. in [place]? --     Vital signs reviewed, nursing assessments reviewed.   Constitutional:   Alert and oriented. Well appearing and in no distress. Eyes:   No scleral icterus. No conjunctival pallor. PERRL. EOMI ENT   Head:  Normocephalic and atraumatic.   Nose:   No congestion/rhinnorhea. No septal hematoma   Mouth/Throat:   MMM, no pharyngeal erythema. No peritonsillar mass.    Neck:   No stridor. No SubQ emphysema. No meningismus. Hematological/Lymphatic/Immunilogical:   No cervical lymphadenopathy. Cardiovascular:   RRR. Symmetric bilateral radial and DP pulses.  No murmurs.   Respiratory:   Normal respiratory effort without tachypnea nor retractions. Breath sounds are clear and equal bilaterally. No wheezes/rales/rhonchi. Gastrointestinal:   Soft and nontender. Mildly distended and full feeling. There is no CVA tenderness.  No rebound, rigidity, or guarding. Genitourinary:   Normal genitalia. No inguinal masses. No inguinal hernias, examined while standing with Valsalva Musculoskeletal:   Nontender with normal range of motion in all extremities. No joint effusions.  No lower extremity tenderness.  No edema. Neurologic:   Normal speech and language.  CN 2-10 normal. Motor grossly intact. No gross focal neurologic deficits are appreciated.  Skin:    Skin is warm, dry and intact. No rash noted.  No petechiae, purpura, or bullae.  ____________________________________________    LABS (pertinent positives/negatives) (all labs ordered are listed, but only abnormal results are displayed) Labs Reviewed  COMPREHENSIVE METABOLIC PANEL - Abnormal; Notable for the following:    Glucose, Bld 103 (*)    All other components within normal limits  URINALYSIS COMPLETEWITH MICROSCOPIC ([facility] ONLY) - Abnormal; Notable for the following:    Color, Urine AMBER (*)    APPearance HAZY (*)    Squamous Epithelial / LPF 0-5 (*)    All other components within normal limits  LIPASE, BLOOD  CBC   ____________________________________________   EKG    ____________________________________________    RADIOLOGY  Two-view abdominal x-ray indicative of constipation, no evidence of obstruction or perforation  ____________________________________________   PROCEDURES   ____________________________________________   INITIAL IMPRESSION / ASSESSMENT AND PLAN / ED COURSE  Pertinent labs & imaging results that were available during my care of the patient were reviewed by me and considered in my medical decision making (see chart for details).  Patient presents with  left-sided abdominal pain. This appears to be mild constipation.Considering the patient's symptoms, medical history, and physical examination today, I have low suspicion for cholecystitis or biliary pathology, pancreatitis, perforation or bowel obstruction, hernia, intra-abdominal abscess, AAA or dissection, volvulus or intussusception, mesenteric ischemia, or appendicitis.  Start the patient on senna and Colace and discharged home. Vital signs are normal, patient's for well-appearing smiling talkative and energetic.     ____________________________________________   FINAL CLINICAL IMPRESSION(S) / ED DIAGNOSES  Final diagnoses:  Abdominal distension  LLQ abdominal pain  Constipation, unspecified constipation type       Portions of this note were generated with dragon dictation software. Dictation errors may occur despite best attempts at proofreading.   Carrie Mew, MD 01/01/16 [phone]

## 2016-01-01 NOTE — Discharge Instructions (Signed)
Abdominal Pain, Adult °Many things can cause abdominal pain. Usually, abdominal pain is not caused by a disease and will improve without treatment. It can often be observed and treated at home. Your health care provider will do a physical exam and possibly order blood tests and X-rays to help determine the seriousness of your pain. However, in many cases, more time must pass before a clear cause of the pain can be found. Before that point, your health care provider may not know if you need more testing or further treatment. °HOME CARE INSTRUCTIONS °Monitor your abdominal pain for any changes. The following actions may help to alleviate any discomfort you are experiencing: °· Only take over-the-counter or prescription medicines as directed by your health care provider. °· Do not take laxatives unless directed to do so by your health care provider. °· Try a clear liquid diet (broth, tea, or water) as directed by your health care provider. Slowly move to a bland diet as tolerated. °SEEK MEDICAL CARE IF: °· You have unexplained abdominal pain. °· You have abdominal pain associated with nausea or diarrhea. °· You have pain when you urinate or have a bowel movement. °· You experience abdominal pain that wakes you in the night. °· You have abdominal pain that is worsened or improved by eating food. °· You have abdominal pain that is worsened with eating fatty foods. °· You have a fever. °SEEK IMMEDIATE MEDICAL CARE IF: °· Your pain does not go away within 2 hours. °· You keep throwing up (vomiting). °· Your pain is felt only in portions of the abdomen, such as the right side or the left lower portion of the abdomen. °· You pass bloody or black tarry stools. °MAKE SURE YOU: °· Understand these instructions. °· Will watch your condition. °· Will get help right away if you are not doing well or get worse. °  °This information is not intended to replace advice given to you by your health care provider. Make sure you discuss  any questions you have with your health care provider. °  °Document Released: 06/15/2005 Document Revised: 05/27/2015 Document Reviewed: 05/15/2013 °Elsevier Interactive Patient Education ©2016 [employer] ° °Constipation, Adult °Constipation is when a person has fewer than three bowel movements a week, has difficulty having a bowel movement, or has stools that are dry, hard, or larger than normal. As people grow older, constipation is more common. A low-fiber diet, not taking in enough fluids, and taking certain medicines may make constipation worse.  °CAUSES  °· Certain medicines, such as antidepressants, pain medicine, iron supplements, antacids, and water pills.   °· Certain diseases, such as diabetes, irritable bowel syndrome (IBS), thyroid disease, or depression.   °· Not drinking enough water.   °· Not eating enough fiber-rich foods.   °· Stress or travel.   °· Lack of physical activity or exercise.   °· Ignoring the urge to have a bowel movement.   °· Using laxatives too much.   °SIGNS AND SYMPTOMS  °· Having fewer than three bowel movements a week.   °· Straining to have a bowel movement.   °· Having stools that are hard, dry, or larger than normal.   °· Feeling full or bloated.   °· Pain in the lower abdomen.   °· Not feeling relief after having a bowel movement.   °DIAGNOSIS  °Your health care provider will take a medical history and perform a physical exam. Further testing may be done for severe constipation. Some tests may include: °· A barium enema X-ray to examine your rectum, colon, and, sometimes,   your small intestine.   °· A sigmoidoscopy to examine your lower colon.   °· A colonoscopy to examine your entire colon. °TREATMENT  °Treatment will depend on the severity of your constipation and what is causing it. Some dietary treatments include drinking more fluids and eating more fiber-rich foods. Lifestyle treatments may include regular exercise. If these diet and lifestyle recommendations do not  help, your health care provider may recommend taking over-the-counter laxative medicines to help you have bowel movements. Prescription medicines may be prescribed if over-the-counter medicines do not work.  °HOME CARE INSTRUCTIONS  °· Eat foods that have a lot of fiber, such as fruits, vegetables, whole grains, and beans. °· Limit foods high in fat and processed sugars, such as french fries, hamburgers, cookies, candies, and soda.   °· A fiber supplement may be added to your diet if you cannot get enough fiber from foods.   °· Drink enough fluids to keep your urine clear or pale yellow.   °· Exercise regularly or as directed by your health care provider.   °· Go to the restroom when you have the urge to go. Do not hold it.   °· Only take over-the-counter or prescription medicines as directed by your health care provider. Do not take other medicines for constipation without talking to your health care provider first.   °SEEK IMMEDIATE MEDICAL CARE IF:  °· You have bright red blood in your stool.   °· Your constipation lasts for more than 4 days or gets worse.   °· You have abdominal or rectal pain.   °· You have thin, pencil-like stools.   °· You have unexplained weight loss. °MAKE SURE YOU:  °· Understand these instructions. °· Will watch your condition. °· Will get help right away if you are not doing well or get worse. °  °This information is not intended to replace advice given to you by your health care provider. Make sure you discuss any questions you have with your health care provider. °  °Document Released: 06/03/2004 Document Revised: 09/26/2014 Document Reviewed: 06/17/2013 °Elsevier Interactive Patient Education ©2016 [employer]. ° °

## 2016-01-07 ENCOUNTER — Other Ambulatory Visit: Payer: Self-pay | Admitting: Family Medicine

## 2016-01-07 NOTE — Telephone Encounter (Signed)
6 months given in January. Shouldn't be due.

## 2016-01-07 NOTE — Telephone Encounter (Signed)
Patients medication was filled today, I told pharmacy that he would have it refilled when he comes in for his next appointment.

## 2016-01-15 ENCOUNTER — Encounter: Payer: Medicare HMO | Admitting: Family Medicine

## 2016-03-31 ENCOUNTER — Ambulatory Visit (INDEPENDENT_AMBULATORY_CARE_PROVIDER_SITE_OTHER): Payer: Commercial Managed Care - HMO | Admitting: Family Medicine

## 2016-03-31 ENCOUNTER — Encounter: Payer: Self-pay | Admitting: Family Medicine

## 2016-03-31 ENCOUNTER — Other Ambulatory Visit: Payer: Self-pay | Admitting: Family Medicine

## 2016-03-31 VITALS — BP 116/73 | HR 64 | Temp 98.0°F | Ht 62.4 in | Wt 161.0 lb

## 2016-03-31 DIAGNOSIS — E038 Other specified hypothyroidism: Secondary | ICD-10-CM | POA: Diagnosis not present

## 2016-03-31 DIAGNOSIS — E785 Hyperlipidemia, unspecified: Secondary | ICD-10-CM

## 2016-03-31 DIAGNOSIS — Z125 Encounter for screening for malignant neoplasm of prostate: Secondary | ICD-10-CM

## 2016-03-31 DIAGNOSIS — R7301 Impaired fasting glucose: Secondary | ICD-10-CM

## 2016-03-31 DIAGNOSIS — F419 Anxiety disorder, unspecified: Secondary | ICD-10-CM | POA: Diagnosis not present

## 2016-03-31 DIAGNOSIS — F32A Depression, unspecified: Secondary | ICD-10-CM

## 2016-03-31 DIAGNOSIS — N400 Enlarged prostate without lower urinary tract symptoms: Secondary | ICD-10-CM | POA: Diagnosis not present

## 2016-03-31 DIAGNOSIS — M25511 Pain in right shoulder: Secondary | ICD-10-CM | POA: Diagnosis not present

## 2016-03-31 DIAGNOSIS — I10 Essential (primary) hypertension: Secondary | ICD-10-CM | POA: Diagnosis not present

## 2016-03-31 DIAGNOSIS — Z0000 Encounter for general adult medical examination without abnormal findings: Secondary | ICD-10-CM

## 2016-03-31 DIAGNOSIS — F329 Major depressive disorder, single episode, unspecified: Secondary | ICD-10-CM

## 2016-03-31 DIAGNOSIS — F209 Schizophrenia, unspecified: Secondary | ICD-10-CM

## 2016-03-31 DIAGNOSIS — B1920 Unspecified viral hepatitis C without hepatic coma: Secondary | ICD-10-CM | POA: Diagnosis not present

## 2016-03-31 DIAGNOSIS — Z1211 Encounter for screening for malignant neoplasm of colon: Secondary | ICD-10-CM

## 2016-03-31 LAB — UA/M W/RFLX CULTURE, ROUTINE
BILIRUBIN UA: NEGATIVE
GLUCOSE, UA: NEGATIVE
Ketones, UA: NEGATIVE
Leukocytes, UA: NEGATIVE
Nitrite, UA: NEGATIVE
PROTEIN UA: NEGATIVE
RBC, UA: NEGATIVE
SPEC GRAV UA: 1.015 (ref 1.005–1.030)
UUROB: 0.2 mg/dL (ref 0.2–1.0)
pH, UA: 7.5 (ref 5.0–7.5)

## 2016-03-31 LAB — MICROALBUMIN, URINE WAIVED
Creatinine, Urine Waived: 100 mg/dL (ref 10–300)
MICROALB, UR WAIVED: 10 mg/L (ref 0–19)
Microalb/Creat Ratio: <30 mg/g (ref <30)

## 2016-03-31 LAB — BAYER DCA HB A1C WAIVED: HB A1C: 5.8 % (ref <7.0)

## 2016-03-31 LAB — LIPID PANEL PICCOLO, WAIVED
CHOL/HDL RATIO PICCOLO,WAIVE: 4.1 mg/dL
CHOLESTEROL PICCOLO, WAIVED: 155 mg/dL (ref <200)
HDL Chol Piccolo, Waived: 38 mg/dL — ABNORMAL LOW (ref >59)
LDL Chol Calc Piccolo Waived: 49 mg/dL (ref <100)
Triglycerides Piccolo,Waived: 338 mg/dL — ABNORMAL HIGH (ref <150)
VLDL Chol Calc Piccolo,Waive: 68 mg/dL — ABNORMAL HIGH (ref <30)

## 2016-03-31 MED ORDER — LISINOPRIL 5 MG PO TABS: 5.0000 mg | ORAL_TABLET | Freq: Every day | ORAL | Status: DC

## 2016-03-31 MED ORDER — ROSUVASTATIN CALCIUM 10 MG PO TABS: 10.0000 mg | ORAL_TABLET | Freq: Every day | ORAL | Status: DC

## 2016-03-31 NOTE — Assessment & Plan Note (Deleted)
Rechecking viral load again today. Await results.

## 2016-03-31 NOTE — Assessment & Plan Note (Signed)
Under good control. Continue diet and exercise. Continue to monitor. Call with any concerns.  

## 2016-03-31 NOTE — Assessment & Plan Note (Signed)
Stable. Continue to follow with psychiatry. Call with any concerns.  

## 2016-03-31 NOTE — Assessment & Plan Note (Signed)
Under good control. Continue current regimen. Continue to monitor. Call with any concerns. 

## 2016-03-31 NOTE — Assessment & Plan Note (Signed)
Continue to follow with urology. Call with any concerns.  °

## 2016-03-31 NOTE — Patient Instructions (Signed)

## 2016-03-31 NOTE — Progress Notes (Signed)
BP 116/73 mmHg  Pulse 64  Temp(Src) 98 F (36.7 C)  Ht 5' 2.4" (1.585 m)  Wt 161 lb (73.029 kg)  BMI 29.07 kg/m2  SpO2 96%   Subjective:    Patient ID: Mitchell [name], male    DOB: 26-Apr-1956, 60 y.o.   MRN: [id number]  HPI: Mitchell [name] is a 60 y.o. male  Chief Complaint  Patient presents with  . Hypertension  . Hyperlipidemia   HYPERTENSION / HYPERLIPIDEMIA Satisfied with current treatment? yes Duration of hypertension: chronic BP monitoring frequency: not checking BP medication side effects: no Duration of hyperlipidemia: chronic Cholesterol medication side effects: no Cholesterol supplements: none Past cholesterol medications: atorvastatin- myaglias, Crestor- fine Medication compliance: excellent compliance Aspirin: yes Recent stressors: no Recurrent headaches: no Visual changes: no Palpitations: no Dyspnea: no Chest pain: no Lower extremity edema: no Dizzy/lightheaded: no  Impaired Fasting Glucose HbA1C: 5.8 Duration of elevated blood sugar: chronic Polydipsia: no Polyuria: no Weight change: no Visual disturbance: no Glucose Monitoring: no Diabetic Education: Not Completed Family history of diabetes: yes  HYPOTHYROIDISM Thyroid control status:stable Satisfied with current treatment? yes Medication side effects: no Medication compliance: excellent compliance Recent dose adjustment:no Fatigue: no Cold intolerance: no Heat intolerance: no Weight gain: no Weight loss: no Constipation: no Diarrhea/loose stools: no Palpitations: no Lower extremity edema: no Anxiety/depressed mood: yes  SHOULDER PAIN Duration: chronic Involved shoulder: right Mechanism of injury: unknown- did a lot of painting for many years Location: anterior Onset:gradual Severity: moderate  Quality:  sharp Frequency: constant Radiation: no Aggravating factors: lifting, movement and throwing  Alleviating factors: ice, NSAIDs and rest  Status: worse Treatments  attempted: rest, ice, heat, ibuprofen and aleve  Relief with NSAIDs?:  mild Weakness: no Numbness: no Decreased grip strength: no Redness: no Swelling: no Bruising: no Fevers: no  Relevant past medical, surgical, family and social history reviewed and updated as indicated. Interim medical history since our last visit reviewed. Allergies and medications reviewed and updated.  Review of Systems  Constitutional: Negative.   Respiratory: Negative.   Cardiovascular: Negative.   Musculoskeletal: Positive for arthralgias. Negative for myalgias, back pain, joint swelling, gait problem, neck pain and neck stiffness.  Psychiatric/Behavioral: Negative.     Per HPI unless specifically indicated above     Objective:    BP 116/73 mmHg  Pulse 64  Temp(Src) 98 F (36.7 C)  Ht 5' 2.4" (1.585 m)  Wt 161 lb (73.029 kg)  BMI 29.07 kg/m2  SpO2 96%  Wt Readings from Last 3 Encounters:  03/31/16 161 lb (73.029 kg)  01/01/16 160 lb (72.576 kg)  12/11/15 160 lb (72.576 kg)    Physical Exam  Constitutional: He is oriented to person, place, and time. He appears well-developed and well-nourished. No distress.  HENT:  Head: Normocephalic and atraumatic.  Right Ear: Hearing normal.  Left Ear: Hearing normal.  Nose: Nose normal.  Eyes: Conjunctivae and lids are normal. Right eye exhibits no discharge. Left eye exhibits no discharge. No scleral icterus.  Cardiovascular: Normal rate, regular rhythm, normal heart sounds and intact distal pulses.  Exam reveals no gallop and no friction rub.   No murmur heard. Pulmonary/Chest: Effort normal and breath sounds normal. No respiratory distress. He has no wheezes. He has no rales. He exhibits no tenderness.  Musculoskeletal: Normal range of motion.  Neurological: He is alert and oriented to person, place, and time.  Skin: Skin is warm, dry and intact. No rash noted. No erythema. No pallor.  Psychiatric: He  has a normal mood and affect. His speech is  normal and behavior is normal. Judgment and thought content normal. Cognition and memory are normal.  Nursing note and vitals reviewed.     Shoulder: right    Inspection:  no swelling, ecchymosis, erythema or step off deformity.     Tenderness to Palpation:    Acromion: no    AC joint:no    Clavicle: yes    Bicipital groove: yes    Scapular spine: no    Coracoid process: yes    Humeral head: no    Supraspinatus tendon: no     Range of Motion:  Full Range of Motion bilaterally     Muscle Strength: 5/5 bilaterally     Neuro: Sensation WNL. and Upper extremity reflexes WNL.     Special Tests:     Neer sign: Negative    Hawkins sign: Negative    Cross arm adduction: Negative    Yergason sign: Negative    O'brien sign: Negative     Speed sign: Negative  Results for orders placed or performed during the hospital encounter of 01/01/16  Lipase, blood  Result Value Ref Range   Lipase 28 11 - 51 U/L  Comprehensive metabolic panel  Result Value Ref Range   Sodium 136 135 - 145 mmol/L   Potassium 4.1 3.5 - 5.1 mmol/L   Chloride 108 101 - 111 mmol/L   CO2 23 22 - 32 mmol/L   Glucose, Bld 103 (H) 65 - 99 mg/dL   BUN 20 6 - 20 mg/dL   Creatinine, Ser 1.16 0.61 - 1.24 mg/dL   Calcium 9.0 8.9 - 10.3 mg/dL   Total Protein 8.0 6.5 - 8.1 g/dL   Albumin 4.1 3.5 - 5.0 g/dL   AST 29 15 - 41 U/L   ALT 25 17 - 63 U/L   Alkaline Phosphatase 41 38 - 126 U/L   Total Bilirubin 0.7 0.3 - 1.2 mg/dL   GFR calc non Af Amer >60 >60 mL/min   GFR calc Af Amer >60 >60 mL/min   Anion gap 5 5 - 15  CBC  Result Value Ref Range   WBC 8.0 3.8 - 10.6 K/uL   RBC 4.70 4.40 - 5.90 MIL/uL   Hemoglobin 15.0 13.0 - 18.0 g/dL   HCT 43.2 40.0 - 52.0 %   MCV 91.9 80.0 - 100.0 fL   MCH 31.8 26.0 - 34.0 pg   MCHC 34.6 32.0 - 36.0 g/dL   RDW 13.4 11.5 - 14.5 %   Platelets 281 150 - 440 K/uL  Urinalysis complete, with microscopic ([facility] only)  Result Value Ref Range   Color, Urine AMBER (A) YELLOW    APPearance HAZY (A) CLEAR   Glucose, UA NEGATIVE NEGATIVE mg/dL   Bilirubin Urine NEGATIVE NEGATIVE   Ketones, ur NEGATIVE NEGATIVE mg/dL   Specific Gravity, Urine 1.020 1.005 - 1.030   Hgb urine dipstick NEGATIVE NEGATIVE   pH 5.0 5.0 - 8.0   Protein, ur NEGATIVE NEGATIVE mg/dL   Nitrite NEGATIVE NEGATIVE   Leukocytes, UA NEGATIVE NEGATIVE   RBC / HPF NONE SEEN 0 - 5 RBC/hpf   WBC, UA 0-5 0 - 5 WBC/hpf   Bacteria, UA NONE SEEN NONE SEEN   Squamous Epithelial / LPF 0-5 (A) NONE SEEN   Mucous PRESENT       Assessment & Plan:   Problem List Items Addressed This Visit      Cardiovascular and Mediastinum   Hypertension -  Primary    Under good control. Continue current regimen. Continue to monitor. Refills given today.        Endocrine   IFG (impaired fasting glucose)    Under good control. Continue diet and exercise. Continue to monitor. Call with any concerns.       Hypothyroidism    Checking levels today. Will adjust as needed. Call with concerns.         Genitourinary   BPH (benign prostatic hyperplasia)    Continue to follow with urology. Call with any concerns.         Other   Anxiety    Stable. Continue to follow with psychiatry. Call with any concerns.       Depression    Stable. Continue to follow with psychiatry. Call with any concerns.       Schizophrenia ([place])    Stable. Continue to follow with psychiatry. Call with any concerns.       Hyperlipidemia    Under good control. Continue current regimen. Continue to monitor. Call with any concerns.        Other Visit Diagnoses    Right shoulder pain        Probably arthritis. Would like to see ortho. Referral generated today.    Relevant Orders    Ambulatory referral to Orthopedic Surgery    Screening for colon cancer        Would like to go to [facility]. Referral generated today.    Relevant Orders    Ambulatory referral to General Surgery        Follow up plan: Return in about 6  months (around 10/01/2016) for Physical.

## 2016-03-31 NOTE — Assessment & Plan Note (Signed)
Checking levels today. Will adjust as needed. Call with concerns.

## 2016-03-31 NOTE — Assessment & Plan Note (Signed)
Under good control. Continue current regimen. Continue to monitor. Refills given today. 

## 2016-04-01 ENCOUNTER — Telehealth: Payer: Self-pay

## 2016-04-01 ENCOUNTER — Telehealth: Payer: Self-pay | Admitting: Family Medicine

## 2016-04-01 ENCOUNTER — Other Ambulatory Visit: Payer: Self-pay

## 2016-04-01 DIAGNOSIS — E038 Other specified hypothyroidism: Secondary | ICD-10-CM

## 2016-04-01 LAB — CBC WITH DIFFERENTIAL/PLATELET
BASOS: 0 %
Basophils Absolute: 0 10*3/uL (ref 0.0–0.2)
EOS (ABSOLUTE): 0.5 10*3/uL — AB (ref 0.0–0.4)
Eos: 6 %
Hematocrit: 41.3 % (ref 37.5–51.0)
Hemoglobin: 14.3 g/dL (ref 12.6–17.7)
IMMATURE GRANS (ABS): 0 10*3/uL (ref 0.0–0.1)
Immature Granulocytes: 0 %
LYMPHS ABS: 3.3 10*3/uL — AB (ref 0.7–3.1)
Lymphs: 42 %
MCH: 32.1 pg (ref 26.6–33.0)
MCHC: 34.6 g/dL (ref 31.5–35.7)
MCV: 93 fL (ref 79–97)
MONOS ABS: 0.5 10*3/uL (ref 0.1–0.9)
Monocytes: 7 %
NEUTROS ABS: 3.6 10*3/uL (ref 1.4–7.0)
Neutrophils: 45 %
PLATELETS: 255 10*3/uL (ref 150–379)
RBC: 4.46 x10E6/uL (ref 4.14–5.80)
RDW: 13.7 % (ref 12.3–15.4)
WBC: 8 10*3/uL (ref 3.4–10.8)

## 2016-04-01 LAB — COMPREHENSIVE METABOLIC PANEL
ALBUMIN: 3.9 g/dL (ref 3.6–4.8)
ALK PHOS: 45 IU/L (ref 39–117)
ALT: 19 IU/L (ref 0–44)
AST: 22 IU/L (ref 0–40)
Albumin/Globulin Ratio: 1.3 (ref 1.2–2.2)
BILIRUBIN TOTAL: 0.2 mg/dL (ref 0.0–1.2)
BUN/Creatinine Ratio: 20 (ref 10–24)
BUN: 21 mg/dL (ref 8–27)
CHLORIDE: 102 mmol/L (ref 96–106)
CO2: 22 mmol/L (ref 18–29)
CREATININE: 1.06 mg/dL (ref 0.76–1.27)
Calcium: 9.2 mg/dL (ref 8.6–10.2)
GFR calc Af Amer: 87 mL/min/{1.73_m2} (ref >59)
GFR calc non Af Amer: 75 mL/min/{1.73_m2} (ref >59)
Globulin, Total: 3 g/dL (ref 1.5–4.5)
Glucose: 138 mg/dL — ABNORMAL HIGH (ref 65–99)
Potassium: 4.2 mmol/L (ref 3.5–5.2)
Sodium: 140 mmol/L (ref 134–144)
Total Protein: 6.9 g/dL (ref 6.0–8.5)

## 2016-04-01 LAB — TSH: TSH: 0.016 u[IU]/mL — AB (ref 0.450–4.500)

## 2016-04-01 MED ORDER — LEVOTHYROXINE SODIUM 25 MCG PO TABS: 25.0000 ug | ORAL_TABLET | Freq: Every day | ORAL | Status: DC

## 2016-04-01 NOTE — Telephone Encounter (Signed)
Can you please let him know that his labs look good, but that we are overtreating his thyroid. I'd like him to take a 1/2 a tablet and come back in in 6 weeks for a recheck on his levels. I've sent through the lower dose in case it's hard to break the pill and the order is in. Thanks!

## 2016-04-01 NOTE — Telephone Encounter (Signed)
Called patient, no answer, left message for patient to return my call.  

## 2016-04-01 NOTE — Telephone Encounter (Signed)
Patient notified. Lab appointment scheduled.  

## 2016-04-01 NOTE — Telephone Encounter (Signed)
Gastroenterology Pre-Procedure Review  Request Date: 06/07/2016 Requesting Physician: Dr. [name]  PATIENT REVIEW QUESTIONS: The patient responded to the following health history questions as indicated:    1. Are you having any GI issues? no 2. Do you have a personal history of Polyps? no 3. Do you have a family history of Colon Cancer or Polyps? no 4. Diabetes Mellitus? no 5. Joint replacements in the past 12 months?no 6. Major health problems in the past 3 months?no 7. Any artificial heart valves, MVP, or defibrillator?no    MEDICATIONS & ALLERGIES:    Patient reports the following regarding taking any anticoagulation/antiplatelet therapy:   Plavix, Coumadin, Eliquis, Xarelto, Lovenox, Pradaxa, Brilinta, or Effient? no Aspirin? yes (unknown )  Patient confirms/reports the following medications:  Current Outpatient Prescriptions  Medication Sig Dispense Refill  . aspirin EC 81 MG tablet Take 1 tablet (81 mg total) by mouth daily.    . benztropine (COGENTIN) 1 MG tablet Take 1 tablet by mouth 2 (two) times daily.    . clonazePAM (KLONOPIN) 0.5 MG tablet Take 0.5 mg by mouth 3 (three) times daily as needed for anxiety.    Marland Kitchen levothyroxine (SYNTHROID, LEVOTHROID) 25 MCG tablet Take 1 tablet (25 mcg total) by mouth daily before breakfast. Come back for a lab test in 6 weeks 30 tablet 1  . lisinopril (PRINIVIL,ZESTRIL) 5 MG tablet Take 1 tablet (5 mg total) by mouth daily. 90 tablet 1  . rosuvastatin (CRESTOR) 10 MG tablet Take 1 tablet (10 mg total) by mouth daily. 90 tablet 1  . sildenafil (VIAGRA) 100 MG tablet Take 1 tablet (100 mg total) by mouth daily as needed for erectile dysfunction. 30 tablet 6  . zolpidem (AMBIEN) 10 MG tablet     . cetirizine (ZYRTEC) 5 MG tablet Take 5 mg by mouth daily. Reported on 04/01/2016    . docusate sodium (COLACE) 100 MG capsule Take 2 capsules (200 mg total) by mouth 2 (two) times daily. (Patient not taking: Reported on 04/01/2016) 120 capsule 0  .  haloperidol (HALDOL) 1 MG tablet Reported on 04/01/2016    . naproxen (NAPROSYN) 500 MG tablet Take 1 tablet (500 mg total) by mouth 2 (two) times daily with a meal. (Patient not taking: Reported on 03/31/2016) 20 tablet 00  . senna (SENOKOT) 8.6 MG TABS tablet Take 2 tablets (17.2 mg total) by mouth 2 (two) times daily. (Patient not taking: Reported on 04/01/2016) 120 each 0   No current facility-administered medications for this visit.    Patient confirms/reports the following allergies:  Allergies  Allergen Reactions  . Atorvastatin Other (See Comments)    myalgias  . Codeine Nausea And Vomiting    No orders of the defined types were placed in this encounter.    AUTHORIZATION INFORMATION Primary Insurance: 1D#: Group #:  Secondary Insurance: 1D#: Group #:  SCHEDULE INFORMATION: Date: 06/07/2016  Time: Location: [facility]

## 2016-04-04 DIAGNOSIS — H2513 Age-related nuclear cataract, bilateral: Secondary | ICD-10-CM | POA: Diagnosis not present

## 2016-04-05 DIAGNOSIS — Z0100 Encounter for examination of eyes and vision without abnormal findings: Secondary | ICD-10-CM | POA: Diagnosis not present

## 2016-04-13 DIAGNOSIS — M19019 Primary osteoarthritis, unspecified shoulder: Secondary | ICD-10-CM | POA: Insufficient documentation

## 2016-04-13 DIAGNOSIS — M13811 Other specified arthritis, right shoulder: Secondary | ICD-10-CM | POA: Diagnosis not present

## 2016-04-27 DIAGNOSIS — X32XXXA Exposure to sunlight, initial encounter: Secondary | ICD-10-CM | POA: Diagnosis not present

## 2016-04-27 DIAGNOSIS — L570 Actinic keratosis: Secondary | ICD-10-CM | POA: Diagnosis not present

## 2016-04-27 DIAGNOSIS — Z85828 Personal history of other malignant neoplasm of skin: Secondary | ICD-10-CM | POA: Diagnosis not present

## 2016-04-27 DIAGNOSIS — Z08 Encounter for follow-up examination after completed treatment for malignant neoplasm: Secondary | ICD-10-CM | POA: Diagnosis not present

## 2016-04-27 DIAGNOSIS — L820 Inflamed seborrheic keratosis: Secondary | ICD-10-CM | POA: Diagnosis not present

## 2016-04-27 DIAGNOSIS — L821 Other seborrheic keratosis: Secondary | ICD-10-CM | POA: Diagnosis not present

## 2016-05-12 ENCOUNTER — Other Ambulatory Visit: Payer: Commercial Managed Care - HMO

## 2016-05-12 DIAGNOSIS — E038 Other specified hypothyroidism: Secondary | ICD-10-CM | POA: Diagnosis not present

## 2016-05-13 ENCOUNTER — Telehealth: Payer: Self-pay | Admitting: Family Medicine

## 2016-05-13 LAB — THYROID PANEL WITH TSH
FREE THYROXINE INDEX: 1.2 (ref 1.2–4.9)
T3 UPTAKE RATIO: 25 % (ref 24–39)
T4 TOTAL: 4.7 ug/dL (ref 4.5–12.0)
TSH: 11.5 u[IU]/mL — AB (ref 0.450–4.500)

## 2016-05-13 NOTE — Telephone Encounter (Signed)
Left message for patient to return my call.

## 2016-05-13 NOTE — Telephone Encounter (Signed)
Please let him know that his thyroid is under treated again. Has he been taking his medicine EVERY day on an empty stomach? If he has, I'll change his dose. If he hasn't, take it EVERY day and I'll check it again when he comes back in.

## 2016-05-16 ENCOUNTER — Other Ambulatory Visit: Payer: Self-pay | Admitting: Family Medicine

## 2016-05-16 NOTE — Telephone Encounter (Signed)
Patient notified

## 2016-06-06 ENCOUNTER — Other Ambulatory Visit: Payer: Self-pay

## 2016-06-07 ENCOUNTER — Ambulatory Visit: Payer: Commercial Managed Care - HMO | Admitting: Certified Registered Nurse Anesthetist

## 2016-06-07 ENCOUNTER — Ambulatory Visit
Admission: RE | Admit: 2016-06-07 | Discharge: 2016-06-07 | Disposition: A | Discharge status: Home / Self Care | Payer: Commercial Managed Care - HMO | Source: Ambulatory Visit | Attending: Gastroenterology | Admitting: Gastroenterology

## 2016-06-07 ENCOUNTER — Encounter
Admission: RE | Disposition: A | Discharge status: Home / Self Care | Payer: Self-pay | Source: Ambulatory Visit | Attending: Gastroenterology

## 2016-06-07 DIAGNOSIS — Z1211 Encounter for screening for malignant neoplasm of colon: Secondary | ICD-10-CM | POA: Diagnosis not present

## 2016-06-07 DIAGNOSIS — E119 Type 2 diabetes mellitus without complications: Secondary | ICD-10-CM | POA: Diagnosis not present

## 2016-06-07 DIAGNOSIS — K449 Diaphragmatic hernia without obstruction or gangrene: Secondary | ICD-10-CM | POA: Insufficient documentation

## 2016-06-07 DIAGNOSIS — K228 Other specified diseases of esophagus: Secondary | ICD-10-CM | POA: Insufficient documentation

## 2016-06-07 DIAGNOSIS — I10 Essential (primary) hypertension: Secondary | ICD-10-CM | POA: Diagnosis not present

## 2016-06-07 DIAGNOSIS — K2970 Gastritis, unspecified, without bleeding: Secondary | ICD-10-CM

## 2016-06-07 DIAGNOSIS — F419 Anxiety disorder, unspecified: Secondary | ICD-10-CM | POA: Insufficient documentation

## 2016-06-07 DIAGNOSIS — K5730 Diverticulosis of large intestine without perforation or abscess without bleeding: Secondary | ICD-10-CM | POA: Insufficient documentation

## 2016-06-07 DIAGNOSIS — K641 Second degree hemorrhoids: Secondary | ICD-10-CM | POA: Diagnosis not present

## 2016-06-07 DIAGNOSIS — K208 Other esophagitis: Secondary | ICD-10-CM | POA: Diagnosis not present

## 2016-06-07 DIAGNOSIS — Z87891 Personal history of nicotine dependence: Secondary | ICD-10-CM | POA: Insufficient documentation

## 2016-06-07 DIAGNOSIS — K222 Esophageal obstruction: Secondary | ICD-10-CM | POA: Insufficient documentation

## 2016-06-07 DIAGNOSIS — Z791 Long term (current) use of non-steroidal anti-inflammatories (NSAID): Secondary | ICD-10-CM | POA: Diagnosis not present

## 2016-06-07 DIAGNOSIS — Z79899 Other long term (current) drug therapy: Secondary | ICD-10-CM | POA: Insufficient documentation

## 2016-06-07 DIAGNOSIS — F209 Schizophrenia, unspecified: Secondary | ICD-10-CM | POA: Insufficient documentation

## 2016-06-07 DIAGNOSIS — K200 Eosinophilic esophagitis: Secondary | ICD-10-CM | POA: Diagnosis not present

## 2016-06-07 DIAGNOSIS — R1310 Dysphagia, unspecified: Secondary | ICD-10-CM

## 2016-06-07 DIAGNOSIS — E039 Hypothyroidism, unspecified: Secondary | ICD-10-CM | POA: Diagnosis not present

## 2016-06-07 DIAGNOSIS — Z7982 Long term (current) use of aspirin: Secondary | ICD-10-CM | POA: Diagnosis not present

## 2016-06-07 HISTORY — PX: COLONOSCOPY WITH PROPOFOL: SHX5780

## 2016-06-07 HISTORY — PX: ESOPHAGOGASTRODUODENOSCOPY (EGD) WITH PROPOFOL: SHX5813

## 2016-06-07 SURGERY — COLONOSCOPY WITH PROPOFOL
Anesthesia: General

## 2016-06-07 SURGERY — ESOPHAGOGASTRODUODENOSCOPY (EGD) WITH PROPOFOL
Anesthesia: General

## 2016-06-07 MED ORDER — ONDANSETRON HCL 4 MG/2ML IJ SOLN
INTRAMUSCULAR | Status: DC | PRN
  Administered 2016-06-07: 4 mg via INTRAVENOUS

## 2016-06-07 MED ORDER — PROPOFOL 10 MG/ML IV BOLUS
INTRAVENOUS | Status: DC | PRN
  Administered 2016-06-07: 20 mg via INTRAVENOUS
  Administered 2016-06-07: 10 mg via INTRAVENOUS
  Administered 2016-06-07: 50 mg via INTRAVENOUS

## 2016-06-07 MED ORDER — EPHEDRINE SULFATE 50 MG/ML IJ SOLN
INTRAMUSCULAR | Status: DC | PRN
  Administered 2016-06-07: 5 mg via INTRAVENOUS

## 2016-06-07 MED ORDER — PROPOFOL 500 MG/50ML IV EMUL
INTRAVENOUS | Status: DC | PRN
  Administered 2016-06-07: 150 ug/kg/min via INTRAVENOUS

## 2016-06-07 MED ORDER — SODIUM CHLORIDE 0.9 % IV SOLN
INTRAVENOUS | Status: DC
  Administered 2016-06-07: 07:00:00 via INTRAVENOUS

## 2016-06-07 MED ORDER — SODIUM CHLORIDE 0.9 % IV SOLN: INTRAVENOUS | Status: DC

## 2016-06-07 MED ORDER — LIDOCAINE HCL (CARDIAC) 20 MG/ML IV SOLN
INTRAVENOUS | Status: DC | PRN
  Administered 2016-06-07: 100 mg via INTRAVENOUS

## 2016-06-07 MED ORDER — MIDAZOLAM HCL 2 MG/2ML IJ SOLN
INTRAMUSCULAR | Status: DC | PRN
  Administered 2016-06-07: 1 mg via INTRAVENOUS

## 2016-06-07 NOTE — Op Note (Signed)
[facility] Gastroenterology Patient Name: Mitchell [name] Procedure Date: 06/07/2016 7:58 AM MRN: [id number] Account #: [id number] Date of Birth: 09/18/1956 Admit Type: Outpatient Age: 60 Room: [facility] ENDO ROOM 4 Gender: Male Note Status: Finalized Procedure:            Colonoscopy Indications:          Screening for colorectal malignant neoplasm Providers:            [name] MD, MD Referring MD:         Valerie Roys (Referring MD) Medicines:            Propofol per Anesthesia Complications:        No immediate complications. Procedure:            Pre-Anesthesia Assessment:                       - Prior to the procedure, a History and Physical was                        performed, and patient medications and allergies were                        reviewed. The patient's tolerance of previous                        anesthesia was also reviewed. The risks and benefits of                        the procedure and the sedation options and risks were                        discussed with the patient. All questions were                        answered, and informed consent was obtained. Prior                        Anticoagulants: The patient has taken no previous                        anticoagulant or antiplatelet agents. ASA Grade                        Assessment: II - A patient with mild systemic disease.                        After reviewing the risks and benefits, the patient was                        deemed in satisfactory condition to undergo the                        procedure.                       After obtaining informed consent, the colonoscope was                        passed under direct vision. Throughout the procedure,  the patient's blood pressure, pulse, and oxygen                        saturations were monitored continuously. The                        Colonoscope was introduced through the anus and    advanced to the the cecum, identified by appendiceal                        orifice and ileocecal valve. The colonoscopy was                        performed without difficulty. The patient tolerated the                        procedure well. The quality of the bowel preparation                        was excellent. Findings:      The perianal and digital rectal examinations were normal.      Non-bleeding internal hemorrhoids were found during retroflexion. The       hemorrhoids were Grade II (internal hemorrhoids that prolapse but reduce       spontaneously).      Multiple small-mouthed diverticula were found in the sigmoid colon. Impression:           - Non-bleeding internal hemorrhoids.                       - Diverticulosis in the sigmoid colon.                       - No specimens collected. Recommendation:       - Discharge patient to home.                       - Resume previous diet.                       - Continue present medications.                       - Repeat colonoscopy in 10 years for screening unless                        any change in family history or lower GI problems. Procedure Code(s):    --- Professional ---                       [phone], Colonoscopy, flexible; diagnostic, including                        collection of specimen(s) by brushing or washing, when                        performed (separate procedure) Diagnosis Code(s):    --- Professional ---                       Z12.11, Encounter for screening for malignant neoplasm  of colon CPT copyright 2016 [employer]. All rights reserved. The codes documented in this report are preliminary and upon coder review may  be revised to meet current compliance requirements. [name] MD, MD 06/07/2016 8:10:14 AM This report has been signed electronically. Number of Addenda: 0 Note Initiated On: 06/07/2016 7:58 AM Scope Withdrawal Time: 0 hours 6 minutes 4 seconds  Total  Procedure Duration: 0 hours 8 minutes 48 seconds       [facility]

## 2016-06-07 NOTE — H&P (Addendum)
Mitchell Lame, MD [facility] [address] [place], [place] [address] Phone: [phone] Fax : [phone]  Primary Care Physician:  Park Liter, DO Primary Gastroenterologist:  Dr. Allen Norris  Pre-Procedure History & Physical: HPI:  Mitchell [name] is a 60 y.o. male is here for a screening colonoscopy and EGD.   Past Medical History:  Diagnosis Date  . Anxiety   . Cancer ([place])    skin cancer  . Depression   . Epididymitis   . Hepatitis C    Tested positive, treated now told that he does not have it  . Hydrocele   . Hypertension   . IFG (impaired fasting glucose)   . Insomnia   . Kidney stone   . Schizophrenia [name]     Past Surgical History:  Procedure Laterality Date  . ANKLE FRACTURE SURGERY    . COLONOSCOPY  2010  . HERNIA REPAIR  2011   X 2    Prior to Admission medications   Medication Sig Start Date End Date Taking? Authorizing Provider  aspirin EC 81 MG tablet Take 1 tablet (81 mg total) by mouth daily. 06/08/15  Yes Megan P Johnson, DO  cetirizine (ZYRTEC) 5 MG tablet Take 5 mg by mouth daily. Reported on 04/01/2016   Yes Historical Provider, MD  clonazePAM (KLONOPIN) 0.5 MG tablet Take 0.5 mg by mouth 3 (three) times daily as needed for anxiety.   Yes Historical Provider, MD  docusate sodium (COLACE) 100 MG capsule Take 2 capsules (200 mg total) by mouth 2 (two) times daily. 01/01/16  Yes Carrie Mew, MD  haloperidol (HALDOL) 1 MG tablet Reported on 04/01/2016 07/21/14  Yes Historical Provider, MD  levothyroxine (SYNTHROID, LEVOTHROID) 50 MCG tablet Take 1 tablet (50 mcg total) by mouth daily before breakfast. 05/16/16  Yes Megan P Johnson, DO  lisinopril (PRINIVIL,ZESTRIL) 5 MG tablet Take 1 tablet (5 mg total) by mouth daily. 03/31/16  Yes Megan P Johnson, DO  naproxen (NAPROSYN) 500 MG tablet Take 1 tablet (500 mg total) by mouth 2 (two) times daily with a meal. 11/12/15  Yes Sable Feil, PA-C  rosuvastatin (CRESTOR) 10 MG tablet Take 1 tablet (10 mg total)  by mouth daily. 03/31/16  Yes Megan P Johnson, DO  senna (SENOKOT) 8.6 MG TABS tablet Take 2 tablets (17.2 mg total) by mouth 2 (two) times daily. 01/01/16  Yes Carrie Mew, MD  sildenafil (VIAGRA) 100 MG tablet Take 1 tablet (100 mg total) by mouth daily as needed for erectile dysfunction. 07/08/15  Yes Megan P Johnson, DO  zolpidem (AMBIEN) 10 MG tablet  08/21/15  Yes Historical Provider, MD  benztropine (COGENTIN) 1 MG tablet Take 1 tablet by mouth 2 (two) times daily. 10/27/15   Historical Provider, MD    Allergies as of 04/01/2016 - Review Complete 03/31/2016  Allergen Reaction Noted  . Atorvastatin Other (See Comments) 08/27/2015  . Codeine Nausea And Vomiting 02/04/2015    Family History  Problem Relation Age of Onset  . Diabetes Mother   . Alzheimer's disease Father   . Diabetes Brother   . Diabetes Maternal Grandfather     Social History   Social History  . Marital status: Married    Spouse name: N/A  . Number of children: N/A  . Years of education: N/A   Occupational History  . Not on file.   Social History Main Topics  . Smoking status: Former Smoker    Quit date: 07/06/1995  . Smokeless tobacco: Former Systems developer  .  Alcohol use No     Comment: remote history, quit 20+ years ago  . Drug use: No     Comment: remote history, quit 20= years ago  . Sexual activity: Yes   Other Topics Concern  . Not on file   Social History Narrative  . No narrative on file    Review of Systems: See HPI, otherwise negative ROS  Physical Exam: BP 107/87   Pulse 64   Temp 97.6 F (36.4 C) (Oral)   Resp 18   Ht 5\' 2"  (1.575 m)   Wt 165 lb (74.8 kg)   SpO2 98%   BMI 30.18 kg/m  General:   Alert,  pleasant and cooperative in NAD Head:  Normocephalic and atraumatic. Neck:  Supple; no masses or thyromegaly. Lungs:  Clear throughout to auscultation.    Heart:  Regular rate and rhythm. Abdomen:  Soft, nontender and nondistended. Normal bowel sounds, without guarding, and  without rebound.   Neurologic:  Alert and  oriented x4;  grossly normal neurologically.  Impression/Plan: [name] is now here to undergo a screening colonoscopy and dysphagia.  Risks, benefits, and alternatives regarding colonoscopy and EGD have been reviewed with the patient.  Questions have been answered.  All parties agreeable.

## 2016-06-07 NOTE — Anesthesia Procedure Notes (Signed)
Performed by: Benjiman Sedgwick Pre-anesthesia Checklist: Patient identified, Emergency Drugs available, Suction available, Patient being monitored and Timeout performed Patient Re-evaluated:Patient Re-evaluated prior to inductionOxygen Delivery Method: Nasal cannula Intubation Type: IV induction       

## 2016-06-07 NOTE — Anesthesia Preprocedure Evaluation (Signed)
Anesthesia Evaluation  Patient identified by MRN, date of birth, ID band Patient awake    Reviewed: Allergy & Precautions, NPO status , Patient's Chart, lab work & pertinent test results  History of Anesthesia Complications (+) PONV  Airway Mallampati: III       Dental   Pulmonary neg pulmonary ROS, former smoker,           Cardiovascular hypertension, Pt. on medications      Neuro/Psych Anxiety Depression Schizophrenia negative neurological ROS     GI/Hepatic (+) Hepatitis -, C  Endo/Other  diabetes (stones)Hypothyroidism   Renal/GU Renal disease     Musculoskeletal   Abdominal   Peds  Hematology negative hematology ROS (+)   Anesthesia Other Findings   Reproductive/Obstetrics                             Anesthesia Physical Anesthesia Plan  ASA: II  Anesthesia Plan: General   Post-op Pain Management:    Induction: Intravenous  Airway Management Planned: Nasal Cannula  Additional Equipment:   Intra-op Plan:   Post-operative Plan:   Informed Consent: I have reviewed the patients History and Physical, chart, labs and discussed the procedure including the risks, benefits and alternatives for the proposed anesthesia with the patient or authorized representative who has indicated his/her understanding and acceptance.     Plan Discussed with:   Anesthesia Plan Comments:         Anesthesia Quick Evaluation

## 2016-06-07 NOTE — Op Note (Signed)
[facility] Gastroenterology Patient Name: Mitchell [name] Procedure Date: 06/07/2016 7:36 AM MRN: [id number] Account #: [id number] Date of Birth: Oct 04, 1955 Admit Type: Outpatient Age: 60 Room: [facility] ENDO ROOM 4 Gender: Male Note Status: Finalized Procedure:            Upper GI endoscopy Indications:          Dysphagia Providers:            [name] MD, MD Referring MD:         Valerie Roys (Referring MD) Medicines:            Propofol per Anesthesia Complications:        No immediate complications. Procedure:            Pre-Anesthesia Assessment:                       - Prior to the procedure, a History and Physical was                        performed, and patient medications and allergies were                        reviewed. The patient's tolerance of previous                        anesthesia was also reviewed. The risks and benefits of                        the procedure and the sedation options and risks were                        discussed with the patient. All questions were                        answered, and informed consent was obtained. Prior                        Anticoagulants: The patient has taken no previous                        anticoagulant or antiplatelet agents. ASA Grade                        Assessment: II - A patient with mild systemic disease.                        After reviewing the risks and benefits, the patient was                        deemed in satisfactory condition to undergo the                        procedure.                       After obtaining informed consent, the endoscope was                        passed under direct vision. Throughout the procedure,  the patient's blood pressure, pulse, and oxygen                        saturations were monitored continuously. The Endoscope                        was introduced through the mouth, and advanced to the                        second part  of duodenum. The upper GI endoscopy was                        accomplished without difficulty. The patient tolerated                        the procedure well. Findings:      One mild benign-appearing, intrinsic stenosis was found at the       gastroesophageal junction. And was traversed. The scope was withdrawn.       Dilation was performed with a Maloney dilator with no resistance at 29       Fr. The dilation site was examined following endoscope reinsertion and       showed complete resolution of luminal narrowing.      Localized mild inflammation characterized by erosions was found in the       gastric antrum. Biopsies were taken with a cold forceps for histology.      The examined duodenum was normal.      Two biopsies were obtained with cold forceps for histology in the middle       third of the esophagus.      A small hiatal hernia was present. Impression:           - Benign-appearing esophageal stenosis. Dilated.                       - Gastritis. Biopsied.                       - Normal examined duodenum.                       - Small hiatal hernia.                       - Biopsy performed in the middle third of the esophagus. Recommendation:       - Await pathology results.                       - Perform a colonoscopy today.                       - Discharge patient to home.                       - Resume previous diet.                       - Continue present medications. Procedure Code(s):    --- Professional ---                       [phone], Esophagogastroduodenoscopy, flexible, transoral;  with biopsy, single or multiple                       43450, Dilation of esophagus, by unguided sound or                        bougie, single or multiple passes Diagnosis Code(s):    --- Professional ---                       R13.10, Dysphagia, unspecified                       K29.70, Gastritis, unspecified, without bleeding CPT copyright 2016 American Medical  Association. All rights reserved. The codes documented in this report are preliminary and upon coder review may  be revised to meet current compliance requirements. [name] MD, MD 06/07/2016 7:57:45 AM This report has been signed electronically. Number of Addenda: 0 Note Initiated On: 06/07/2016 7:36 AM      [place]

## 2016-06-07 NOTE — Transfer of Care (Signed)
Immediate Anesthesia Transfer of Care Note  Patient: Mitchell [name]  Procedure(s) Performed: Procedure(s): ESOPHAGOGASTRODUODENOSCOPY (EGD) WITH PROPOFOL (N/A) COLONOSCOPY WITH PROPOFOL  Patient Location: PACU  Anesthesia Type:General  Level of Consciousness: sedated  Airway & Oxygen Therapy: Patient Spontanous Breathing and Patient connected to nasal cannula oxygen  Post-op Assessment: Report given to RN and Post -op Vital signs reviewed and stable  Post vital signs: Reviewed and stable  Last Vitals:  Vitals:   06/07/16 0708 06/07/16 0810  BP: 107/87 (!) 95/53  Pulse: 64 81  Resp: 18 18  Temp: 36.4 C 36.2 C    Last Pain:  Vitals:   06/07/16 0810  TempSrc: Tympanic         Complications: No apparent anesthesia complications

## 2016-06-07 NOTE — Anesthesia Postprocedure Evaluation (Signed)
Anesthesia Post Note  Patient: Mitchell [name]  Procedure(s) Performed: Procedure(s) (LRB): ESOPHAGOGASTRODUODENOSCOPY (EGD) WITH PROPOFOL (N/A) COLONOSCOPY WITH PROPOFOL  Patient location during evaluation: PACU Anesthesia Type: General Level of consciousness: awake and alert Pain management: pain level controlled Vital Signs Assessment: post-procedure vital signs reviewed and stable Respiratory status: spontaneous breathing and respiratory function stable Cardiovascular status: stable Anesthetic complications: no    Last Vitals:  Vitals:   06/07/16 0840 06/07/16 0850  BP: 128/87 (!) 148/97  Pulse:    Resp:  20  Temp:      Last Pain:  Vitals:   06/07/16 0810  TempSrc: Tympanic                 Gyan Cambre K

## 2016-06-08 ENCOUNTER — Encounter: Payer: Self-pay | Admitting: Gastroenterology

## 2016-06-08 LAB — SURGICAL PATHOLOGY

## 2016-06-08 NOTE — Progress Notes (Signed)
Having problems with bloating today. I/s to avoid straws,carbonated beverages and ambulate more. Understanding voiced

## 2016-06-10 ENCOUNTER — Telehealth: Payer: Self-pay

## 2016-06-10 ENCOUNTER — Other Ambulatory Visit: Payer: Self-pay

## 2016-06-10 DIAGNOSIS — K200 Eosinophilic esophagitis: Secondary | ICD-10-CM

## 2016-06-10 MED ORDER — FLUTICASONE PROPIONATE HFA 220 MCG/ACT IN AERO: INHALATION_SPRAY | RESPIRATORY_TRACT | 12 refills | Status: DC

## 2016-06-10 NOTE — Telephone Encounter (Signed)
-----   Message from [name], MD sent at 06/09/2016 12:38 PM EDT ----- The patient know that the biopsies were consistent with eosinophilic esophagitis and treat him for this please.

## 2016-06-10 NOTE — Telephone Encounter (Signed)
Pt notified of EGD results and Rx that has been sent to [facility] per pt request.

## 2016-06-17 ENCOUNTER — Telehealth: Payer: Self-pay | Admitting: Family Medicine

## 2016-06-17 NOTE — Telephone Encounter (Signed)
Left message notifying patient to increase his BP meds.

## 2016-06-17 NOTE — Telephone Encounter (Signed)
He can take 1.5 pills, but please get him scheduled for a follow up ASAP

## 2016-06-17 NOTE — Telephone Encounter (Signed)
Pt called with BP readings from last 5 days, he is concerned that numbers are still a little high.  9/25  148/88 9/26  156/88 9/27  144/85 9/28  140/84 9/29   152/86  He would like someone to call him.

## 2016-06-27 ENCOUNTER — Ambulatory Visit: Payer: Self-pay | Admitting: Family Medicine

## 2016-06-27 ENCOUNTER — Ambulatory Visit (INDEPENDENT_AMBULATORY_CARE_PROVIDER_SITE_OTHER): Payer: Commercial Managed Care - HMO

## 2016-06-27 DIAGNOSIS — Z23 Encounter for immunization: Secondary | ICD-10-CM | POA: Diagnosis not present

## 2016-07-04 ENCOUNTER — Other Ambulatory Visit: Payer: Self-pay | Admitting: Family Medicine

## 2016-07-04 ENCOUNTER — Ambulatory Visit (INDEPENDENT_AMBULATORY_CARE_PROVIDER_SITE_OTHER): Payer: Commercial Managed Care - HMO | Admitting: Family Medicine

## 2016-07-04 ENCOUNTER — Encounter: Payer: Self-pay | Admitting: Family Medicine

## 2016-07-04 VITALS — BP 118/71 | HR 67 | Temp 98.2°F | Wt 166.6 lb

## 2016-07-04 DIAGNOSIS — E038 Other specified hypothyroidism: Secondary | ICD-10-CM | POA: Diagnosis not present

## 2016-07-04 DIAGNOSIS — I10 Essential (primary) hypertension: Secondary | ICD-10-CM

## 2016-07-04 DIAGNOSIS — B1920 Unspecified viral hepatitis C without hepatic coma: Secondary | ICD-10-CM

## 2016-07-04 MED ORDER — LISINOPRIL 5 MG PO TABS: 7.5000 mg | ORAL_TABLET | Freq: Every day | ORAL | 1 refills | Status: DC

## 2016-07-04 NOTE — Assessment & Plan Note (Signed)
Rechecking levels again today. Will adjust as needed. Call with any concerns.

## 2016-07-04 NOTE — Assessment & Plan Note (Signed)
Unsure of viral load, would like viral load checked- checking today, await results.

## 2016-07-04 NOTE — Progress Notes (Signed)
BP 118/71 (BP Location: Left Arm, Patient Position: Sitting, Cuff Size: Normal)   Pulse 67   Temp 98.2 F (36.8 C)   Wt 166 lb 9.6 oz (75.6 kg)   SpO2 96%   BMI 30.47 kg/m    Subjective:    Patient ID: Mitchell [name], male    DOB: 02-10-1956, 60 y.o.   MRN: [id number]  HPI: Mitchell [name] is a 60 y.o. male  Chief Complaint  Patient presents with  . Hypothyroidism  . Hypertension   HYPERTENSION Hypertension status: controlled  Satisfied with current treatment? yes Duration of hypertension: chronic BP monitoring frequency:  Couple of times a month BP medication side effects:  no Medication compliance: excellent compliance Aspirin: yes Recurrent headaches: no Visual changes: no Palpitations: no Dyspnea: no Chest pain: no Lower extremity edema: no Dizzy/lightheaded: no  HYPOTHYROIDISM Thyroid control status:better Satisfied with current treatment? yes Medication side effects: no Medication compliance: excellent compliance Etiology of hypothyroidism:  Recent dose adjustment:yes Fatigue: yes Cold intolerance: no Heat intolerance: no Weight gain: no Weight loss: no Constipation: no Diarrhea/loose stools: no Palpitations: no Lower extremity edema: no Anxiety/depressed mood: no  Relevant past medical, surgical, family and social history reviewed and updated as indicated. Interim medical history since our last visit reviewed. Allergies and medications reviewed and updated.  Review of Systems  Constitutional: Negative.   Respiratory: Negative.   Cardiovascular: Negative.   Psychiatric/Behavioral: Negative.     Per HPI unless specifically indicated above     Objective:    BP 118/71 (BP Location: Left Arm, Patient Position: Sitting, Cuff Size: Normal)   Pulse 67   Temp 98.2 F (36.8 C)   Wt 166 lb 9.6 oz (75.6 kg)   SpO2 96%   BMI 30.47 kg/m   Wt Readings from Last 3 Encounters:  07/04/16 166 lb 9.6 oz (75.6 kg)  06/07/16 165 lb (74.8 kg)  03/31/16  161 lb (73 kg)    Physical Exam  Constitutional: He is oriented to person, place, and time. He appears well-developed and well-nourished. No distress.  HENT:  Head: Normocephalic and atraumatic.  Right Ear: Hearing normal.  Left Ear: Hearing normal.  Nose: Nose normal.  Eyes: Conjunctivae and lids are normal. Right eye exhibits no discharge. Left eye exhibits no discharge. No scleral icterus.  Cardiovascular: Normal rate, regular rhythm, normal heart sounds and intact distal pulses.  Exam reveals no gallop and no friction rub.   No murmur heard. Pulmonary/Chest: Effort normal. No respiratory distress. He has no wheezes. He has no rales. He exhibits no tenderness.  Musculoskeletal: Normal range of motion.  Neurological: He is alert and oriented to person, place, and time.  Skin: Skin is warm, dry and intact. No rash noted. No erythema. No pallor.  Psychiatric: He has a normal mood and affect. His speech is normal and behavior is normal. Judgment and thought content normal. Cognition and memory are normal.  Vitals reviewed.     Assessment & Plan:   Problem List Items Addressed This Visit      Cardiovascular and Mediastinum   Hypertension - Primary    Under good control. Continue current regimen. Continue to monitor. Recheck at physical.      Relevant Medications   lisinopril (PRINIVIL,ZESTRIL) 5 MG tablet     Digestive   Hepatitis C    Unsure of viral load, would like viral load checked- checking today, await results.       Relevant Orders   HCV RT-PCR, Quant (Non-Graph)  Endocrine   Hypothyroidism    Rechecking levels again today. Will adjust as needed. Call with any concerns.        Other Visit Diagnoses   None.      Follow up plan: Return in about 6 months (around 01/02/2017) for Physical.

## 2016-07-04 NOTE — Assessment & Plan Note (Signed)
Under good control. Continue current regimen. Continue to monitor. Recheck at physical.

## 2016-07-05 ENCOUNTER — Telehealth: Payer: Self-pay | Admitting: Family Medicine

## 2016-07-05 MED ORDER — LEVOTHYROXINE SODIUM 50 MCG PO TABS: 50.0000 ug | ORAL_TABLET | Freq: Every day | ORAL | 4 refills | Status: DC

## 2016-07-05 NOTE — Telephone Encounter (Signed)
Please let him know that his labs came back normal (we don't have the hep C testing yet) So I've sent a year supply of his medicine to his pharmacy. Thanks!

## 2016-07-05 NOTE — Telephone Encounter (Signed)
Patient notified

## 2016-07-14 ENCOUNTER — Telehealth: Payer: Self-pay | Admitting: Family Medicine

## 2016-07-14 LAB — HCV RT-PCR, QUANT (NON-GRAPH)

## 2016-07-14 LAB — BASIC METABOLIC PANEL
BUN / CREAT RATIO: 15 (ref 10–24)
BUN: 19 mg/dL (ref 8–27)
CO2: 23 mmol/L (ref 18–29)
CREATININE: 1.28 mg/dL — AB (ref 0.76–1.27)
Calcium: 9.1 mg/dL (ref 8.6–10.2)
Chloride: 102 mmol/L (ref 96–106)
GFR calc Af Amer: 69 mL/min/{1.73_m2} (ref >59)
GFR, EST NON AFRICAN AMERICAN: 60 mL/min/{1.73_m2} (ref >59)
Glucose: 136 mg/dL — ABNORMAL HIGH (ref 65–99)
Potassium: 4.2 mmol/L (ref 3.5–5.2)
SODIUM: 138 mmol/L (ref 134–144)

## 2016-07-14 LAB — TSH: TSH: 3.62 u[IU]/mL (ref 0.450–4.500)

## 2016-07-14 NOTE — Telephone Encounter (Signed)
Patient notified

## 2016-07-14 NOTE — Telephone Encounter (Signed)
Please let him know that his Hep C test came back negative. Thanks!

## 2016-07-18 ENCOUNTER — Encounter: Payer: Commercial Managed Care - HMO | Admitting: Family Medicine

## 2016-07-18 ENCOUNTER — Other Ambulatory Visit: Payer: Self-pay | Admitting: Family Medicine

## 2016-07-18 DIAGNOSIS — R351 Nocturia: Secondary | ICD-10-CM

## 2016-07-18 DIAGNOSIS — R7301 Impaired fasting glucose: Secondary | ICD-10-CM

## 2016-07-18 DIAGNOSIS — N401 Enlarged prostate with lower urinary tract symptoms: Secondary | ICD-10-CM

## 2016-07-18 DIAGNOSIS — Z0000 Encounter for general adult medical examination without abnormal findings: Secondary | ICD-10-CM

## 2016-07-18 DIAGNOSIS — E782 Mixed hyperlipidemia: Secondary | ICD-10-CM

## 2016-07-18 DIAGNOSIS — I10 Essential (primary) hypertension: Secondary | ICD-10-CM

## 2016-07-21 ENCOUNTER — Emergency Department: Payer: Commercial Managed Care - HMO

## 2016-07-21 ENCOUNTER — Emergency Department
Admission: EM | Admit: 2016-07-21 | Discharge: 2016-07-21 | Disposition: A | Discharge status: Home / Self Care | Payer: Commercial Managed Care - HMO | Attending: Emergency Medicine | Admitting: Emergency Medicine

## 2016-07-21 DIAGNOSIS — Z87891 Personal history of nicotine dependence: Secondary | ICD-10-CM | POA: Diagnosis not present

## 2016-07-21 DIAGNOSIS — Z79899 Other long term (current) drug therapy: Secondary | ICD-10-CM | POA: Insufficient documentation

## 2016-07-21 DIAGNOSIS — I10 Essential (primary) hypertension: Secondary | ICD-10-CM | POA: Insufficient documentation

## 2016-07-21 DIAGNOSIS — Y9389 Activity, other specified: Secondary | ICD-10-CM | POA: Diagnosis not present

## 2016-07-21 DIAGNOSIS — M19011 Primary osteoarthritis, right shoulder: Secondary | ICD-10-CM | POA: Diagnosis not present

## 2016-07-21 DIAGNOSIS — Z7982 Long term (current) use of aspirin: Secondary | ICD-10-CM | POA: Insufficient documentation

## 2016-07-21 DIAGNOSIS — Y929 Unspecified place or not applicable: Secondary | ICD-10-CM | POA: Insufficient documentation

## 2016-07-21 DIAGNOSIS — E039 Hypothyroidism, unspecified: Secondary | ICD-10-CM | POA: Diagnosis not present

## 2016-07-21 DIAGNOSIS — Z85828 Personal history of other malignant neoplasm of skin: Secondary | ICD-10-CM | POA: Insufficient documentation

## 2016-07-21 DIAGNOSIS — X503XXA Overexertion from repetitive movements, initial encounter: Secondary | ICD-10-CM | POA: Insufficient documentation

## 2016-07-21 DIAGNOSIS — Y990 Civilian activity done for income or pay: Secondary | ICD-10-CM | POA: Insufficient documentation

## 2016-07-21 DIAGNOSIS — M19019 Primary osteoarthritis, unspecified shoulder: Secondary | ICD-10-CM

## 2016-07-21 DIAGNOSIS — M25511 Pain in right shoulder: Secondary | ICD-10-CM | POA: Diagnosis not present

## 2016-07-21 MED ORDER — NAPROXEN 500 MG PO TABS: 500.0000 mg | ORAL_TABLET | Freq: Two times a day (BID) | ORAL | 0 refills | Status: DC

## 2016-07-21 MED ORDER — TRAMADOL HCL 50 MG PO TABS: 50.0000 mg | ORAL_TABLET | Freq: Two times a day (BID) | ORAL | 0 refills | Status: DC | PRN

## 2016-07-21 NOTE — ED Provider Notes (Signed)
[facility] Emergency Department Provider Note   ____________________________________________   None    (approximate)  I have reviewed the triage vital signs and the nursing notes.   HISTORY  Chief Complaint Shoulder Pain    HPI Mitchell [name] is a 60 y.o. male patient complaining of right recurrent shoulder pain. Patient states this episode for 3 days. Patient state last month he received a cortisone shot which relieved his pain for approximately 2-3 weeks. 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 Patient rated his pain as a 7/10. Patient described a pain as "achy". No palliative measures for this complaint in the last 2 days.  Past Medical History:  Diagnosis Date  . Anxiety   . Cancer ([place])    skin cancer  . Depression   . Epididymitis   . Hepatitis C    Tested positive, treated now told that he does not have it  . Hydrocele   . Hypertension   . IFG (impaired fasting glucose)   . Insomnia   . Kidney stone   . Schizophrenia [facility])     Patient Active Problem List   Diagnosis Date Noted  . Special screening for malignant neoplasms, colon   . Problems with swallowing and mastication   . Gastritis   . Swollen testicle 08/14/2015  . BPH with obstruction/lower urinary tract symptoms 08/14/2015  . Hyperlipidemia 07/22/2015  . Hypothyroidism 07/17/2015  . BPH (benign prostatic hyperplasia) 07/16/2015  . Scrotal swelling 07/16/2015  . Hydrocele, right 07/08/2015  . Drug-induced erectile dysfunction 07/08/2015  . IFG (impaired fasting glucose)   . Schizophrenia ([place])   . Insomnia   . Anxiety   . Hypertension   . Depression   . Hepatitis C     Past Surgical History:  Procedure Laterality Date  . ANKLE FRACTURE SURGERY    . COLONOSCOPY  2010  . COLONOSCOPY WITH PROPOFOL  06/07/2016   Procedure: COLONOSCOPY WITH PROPOFOL;  Surgeon: [name], MD;   Location: [facility] ENDOSCOPY;  Service: Endoscopy;;  . ESOPHAGOGASTRODUODENOSCOPY (EGD) WITH PROPOFOL N/A 06/07/2016   Procedure: ESOPHAGOGASTRODUODENOSCOPY (EGD) WITH PROPOFOL;  Surgeon: [name], MD;  Location: [facility] ENDOSCOPY;  Service: Endoscopy;  Laterality: N/A;  . HERNIA REPAIR  2011   X 2    Prior to Admission medications   Medication Sig Start Date End Date Taking? Authorizing Provider  aspirin EC 81 MG tablet Take 1 tablet (81 mg total) by mouth daily. 06/08/15   Megan P Johnson, DO  cetirizine (ZYRTEC) 5 MG tablet Take 5 mg by mouth daily. Reported on 04/01/2016    Historical Provider, MD  clonazePAM (KLONOPIN) 0.5 MG tablet Take 0.5 mg by mouth 3 (three) times daily as needed for anxiety.    Historical Provider, MD  docusate sodium (COLACE) 100 MG capsule Take 2 capsules (200 mg total) by mouth 2 (two) times daily. 01/01/16   Carrie Mew, MD  fluticasone (FLOVENT HFA) 220 MCG/ACT inhaler Take 2 puffs and swallow twice daily for 8 weeks for eosinophilic esophagitis. *Rinse mouth after each use* 06/10/16   [name], MD  levothyroxine (SYNTHROID, LEVOTHROID) 50 MCG tablet Take 1 tablet (50 mcg total) by mouth daily before breakfast. 07/05/16   Megan P Johnson, DO  lisinopril (PRINIVIL,ZESTRIL) 5 MG tablet Take 1.5 tablets (7.5 mg total) by mouth daily. 07/04/16   Megan P Johnson, DO  naproxen (NAPROSYN) 500 MG tablet Take 1  tablet (500 mg total) by mouth 2 (two) times daily with a meal. 07/21/16   Sable Feil, PA-C  rosuvastatin (CRESTOR) 10 MG tablet Take 1 tablet (10 mg total) by mouth daily. 03/31/16   Megan P Johnson, DO  sildenafil (VIAGRA) 100 MG tablet Take 1 tablet (100 mg total) by mouth daily as needed for erectile dysfunction. 07/08/15   Megan P Johnson, DO  traMADol (ULTRAM) 50 MG tablet Take 1 tablet (50 mg total) by mouth every 12 (twelve) hours as needed. 07/21/16   Sable Feil, PA-C    Allergies Atorvastatin and Codeine  Family History  Problem Relation Age of  Onset  . Diabetes Mother   . Alzheimer's disease Father   . Diabetes Brother   . Diabetes Maternal Grandfather     Social History Social History  Substance Use Topics  . Smoking status: Former Smoker    Quit date: 07/06/1995  . Smokeless tobacco: Former Systems developer  . Alcohol use No     Comment: remote history, quit 20+ years ago    Review of Systems Constitutional: No fever/chills Eyes: No visual changes. ENT: No sore throat. Cardiovascular: Denies chest pain. Respiratory: Denies shortness of breath. Gastrointestinal: No abdominal pain.  No nausea, no vomiting.  No diarrhea.  No constipation. Genitourinary: Negative for dysuria. Musculoskeletal: Negative for back pain. Skin: Negative for rash. Neurological: Negative for headaches, focal weakness or numbness. Psychiatric:Schizophrenia and depression.  Endocrine:Hypertension, hyperlipidemia, and hypothyroidism. Hematological/Lymphatic:Hepatitis C Allergic/Immunilogical: See medication list.  ____________________________________________   PHYSICAL EXAM:  VITAL SIGNS: ED Triage Vitals  Enc Vitals Group     BP 07/21/16 1329 110/79     Pulse Rate 07/21/16 1329 81     Resp 07/21/16 1329 18     Temp 07/21/16 1329 98.3 F (36.8 C)     Temp Source 07/21/16 1329 Oral     SpO2 07/21/16 1330 98 %     Weight 07/21/16 1330 165 lb (74.8 kg)     Height 07/21/16 1330 5\' 4"  (1.626 m)     Head Circumference --      Peak Flow --      Pain Score 07/21/16 1330 7     Pain Loc --      Pain Edu? --      Excl. in [place]? --     Constitutional: Alert and oriented. Well appearing and in no acute distress. Eyes: Conjunctivae are normal. PERRL. EOMI. Head: Atraumatic. Nose: No congestion/rhinnorhea. Mouth/Throat: Mucous membranes are moist.  Oropharynx non-erythematous. Neck: No stridor.  No cervical spine tenderness to palpation. Hematological/Lymphatic/Immunilogical: No cervical lymphadenopathy. Cardiovascular: Normal rate, regular  rhythm. Grossly normal heart sounds.  Good peripheral circulation. Respiratory: Normal respiratory effort.  No retractions. Lungs CTAB. Gastrointestinal: Soft and nontender. No distention. No abdominal bruits. No CVA tenderness. Musculoskeletal: No lower extremity tenderness nor edema.  No joint effusions. Neurologic:  Normal speech and language. No gross focal neurologic deficits are appreciated. No gait instability. Skin:  Skin is warm, dry and intact. No rash noted. Psychiatric: Mood and affect are normal. Speech and behavior are normal.  ____________________________________________   LABS (all labs ordered are listed, but only abnormal results are displayed)  Labs Reviewed - No data to display ____________________________________________  EKG   ____________________________________________  RADIOLOGY  No acute findings x-ray of the right shoulder. Patient has  mild osteoarthritic changes. ____________________________________________   PROCEDURES  Procedure(s) performed:   Procedures  Critical Care performed: No  ____________________________________________   INITIAL IMPRESSION / ASSESSMENT AND  PLAN / ED COURSE  Pertinent labs & imaging results that were available during my care of the patient were reviewed by me and considered in my medical decision making (see chart for details).  Right shoulder pain secondary to arthritis. Discussed x-ray finding with patient. Patient given discharge Instructions. Patient given a prescription for naproxen and tramadol. Patient advised follow-up family doctor for continued care.    ____________________________________________   FINAL CLINICAL IMPRESSION(S) / ED DIAGNOSES  Final diagnoses:  Arthritis of shoulder      NEW MEDICATIONS STARTED DURING THIS VISIT:  New Prescriptions   NAPROXEN (NAPROSYN) 500 MG TABLET    Take 1 tablet (500 mg total) by mouth 2 (two) times daily with a meal.   TRAMADOL (ULTRAM) 50 MG TABLET     Take 1 tablet (50 mg total) by mouth every 12 (twelve) hours as needed.     Note:  This document was prepared using Dragon voice recognition software and may include unintentional dictation errors.   Sable Feil, PA-C 07/21/16 [place], MD 07/22/16 [phone]

## 2016-07-21 NOTE — ED Triage Notes (Signed)
Recurrent right shoulder pain X 3 days. Pt paints with right arm, has shoulder pain from using so much. Pt cortisone shot to right shoulder recently. Pt alert and oriented X4, active, cooperative, pt in NAD. RR even and unlabored, color WNL.

## 2016-08-08 DIAGNOSIS — M19011 Primary osteoarthritis, right shoulder: Secondary | ICD-10-CM | POA: Diagnosis not present

## 2016-08-08 DIAGNOSIS — M25511 Pain in right shoulder: Secondary | ICD-10-CM | POA: Diagnosis not present

## 2016-08-23 ENCOUNTER — Ambulatory Visit: Payer: Commercial Managed Care - HMO | Admitting: Family Medicine

## 2016-08-24 ENCOUNTER — Encounter: Payer: Self-pay | Admitting: Family Medicine

## 2016-08-24 ENCOUNTER — Ambulatory Visit (INDEPENDENT_AMBULATORY_CARE_PROVIDER_SITE_OTHER): Payer: Commercial Managed Care - HMO | Admitting: Family Medicine

## 2016-08-24 VITALS — BP 131/83 | HR 83 | Temp 98.3°F | Wt 164.0 lb

## 2016-08-24 DIAGNOSIS — J069 Acute upper respiratory infection, unspecified: Secondary | ICD-10-CM

## 2016-08-24 DIAGNOSIS — B9789 Other viral agents as the cause of diseases classified elsewhere: Secondary | ICD-10-CM

## 2016-08-24 MED ORDER — BENZONATATE 100 MG PO CAPS: 200.0000 mg | ORAL_CAPSULE | Freq: Three times a day (TID) | ORAL | 0 refills | Status: DC | PRN

## 2016-08-24 NOTE — Progress Notes (Signed)
   BP 131/83   Pulse 83   Temp 98.3 F (36.8 C)   Wt 164 lb (74.4 kg)   SpO2 96%   BMI 28.15 kg/m    Subjective:    Patient ID: Mitchell [name], male    DOB: 04/11/1956, 60 y.o.   MRN: [id number]  HPI: CARMERON GETTINGS is a 60 y.o. male  Chief Complaint  Patient presents with  . URI    x 2 days, head and chest congestion, some productive cough, fatigue. No sore throat, no fever.    Patient presents with fatigue and dry cough x 2 days. Denies sore throat, fever, nasal drainage, CP, SOB, wheezing. States boss had a cold and now he and his wife both have one. Has been taking mucinex with some improvement, has been able to cough up some phlegm since taking that.   Relevant past medical, surgical, family and social history reviewed and updated as indicated. Interim medical history since our last visit reviewed. Allergies and medications reviewed and updated.  Review of Systems  Constitutional: Positive for fatigue.  HENT: Positive for congestion.   Eyes: Negative.   Respiratory: Positive for cough. Negative for shortness of breath and wheezing.   Cardiovascular: Negative.   Gastrointestinal: Negative.   Genitourinary: Negative.   Musculoskeletal: Negative.   Skin: Negative.   Neurological: Negative.   Psychiatric/Behavioral: Negative.     Per HPI unless specifically indicated above     Objective:    BP 131/83   Pulse 83   Temp 98.3 F (36.8 C)   Wt 164 lb (74.4 kg)   SpO2 96%   BMI 28.15 kg/m   Wt Readings from Last 3 Encounters:  08/24/16 164 lb (74.4 kg)  07/21/16 165 lb (74.8 kg)  07/04/16 166 lb 9.6 oz (75.6 kg)    Physical Exam  Constitutional: He is oriented to person, place, and time. He appears well-developed and well-nourished. No distress.  HENT:  Head: Atraumatic.  Right Ear: External ear normal.  Left Ear: External ear normal.  Nose: Nose normal.  Mouth/Throat: Oropharynx is clear and moist.  Eyes: Conjunctivae are normal. Pupils are equal, round,  and reactive to light. No scleral icterus.  Neck: Normal range of motion. Neck supple.  Cardiovascular: Normal rate and normal heart sounds.   Pulmonary/Chest: Effort normal and breath sounds normal. No respiratory distress. He has no wheezes. He has no rales.  Musculoskeletal: Normal range of motion.  Lymphadenopathy:    He has no cervical adenopathy.  Neurological: He is alert and oriented to person, place, and time.  Skin: Skin is warm and dry.  Psychiatric: He has a normal mood and affect. His behavior is normal.  Nursing note and vitals reviewed.     Assessment & Plan:   Problem List Items Addressed This Visit    None    Visit Diagnoses    Viral URI with cough    -  Primary   Discussed supportive care. Nyquil, dayquil, mucinex, humidifier, rest. Tessalon perles sent if needed. Follow up if no improvement       Follow up plan: Return if symptoms worsen or fail to improve.

## 2016-08-24 NOTE — Patient Instructions (Signed)
Mucinex, dauquil, nyquil Tessalon perles for daytime cough suppression

## 2016-09-16 ENCOUNTER — Ambulatory Visit: Payer: Self-pay | Admitting: Family Medicine

## 2016-09-16 ENCOUNTER — Other Ambulatory Visit: Payer: Self-pay

## 2016-09-16 MED ORDER — OMEGA-3-ACID ETHYL ESTERS 1 G PO CAPS: 2.0000 g | ORAL_CAPSULE | Freq: Two times a day (BID) | ORAL | 1 refills | Status: DC

## 2016-09-16 NOTE — Telephone Encounter (Signed)
New prescription request for Omega 3 Capsules.  Last Visit: 06/24/2016

## 2016-09-20 ENCOUNTER — Telehealth: Payer: Self-pay | Admitting: Family Medicine

## 2016-09-20 NOTE — Telephone Encounter (Signed)
Patient missed appt Friday per patient he forgot about the appt and was thinking we were closed.  He has a concern that he may be charged for missed appt.  He called and rescheduled the appt,

## 2016-09-21 NOTE — Telephone Encounter (Signed)
Please advise patient that he will not be charged for [place] this time.

## 2016-09-24 ENCOUNTER — Other Ambulatory Visit: Payer: Self-pay | Admitting: Family Medicine

## 2016-09-27 NOTE — Telephone Encounter (Signed)
Patient was given this information

## 2016-10-06 ENCOUNTER — Ambulatory Visit: Payer: Self-pay | Admitting: Family Medicine

## 2016-10-13 ENCOUNTER — Ambulatory Visit (INDEPENDENT_AMBULATORY_CARE_PROVIDER_SITE_OTHER): Payer: Medicare HMO | Admitting: Family Medicine

## 2016-10-13 ENCOUNTER — Encounter: Payer: Self-pay | Admitting: Family Medicine

## 2016-10-13 VITALS — BP 117/70 | HR 69 | Ht 62.0 in | Wt 168.0 lb

## 2016-10-13 DIAGNOSIS — I10 Essential (primary) hypertension: Secondary | ICD-10-CM

## 2016-10-13 NOTE — Progress Notes (Signed)
BP 117/70   Pulse 69   Ht 5\' 2"  (1.575 m)   Wt 168 lb (76.2 kg)   SpO2 97%   BMI 30.73 kg/m    Subjective:    Patient ID: Mitchell [name], male    DOB: 02-28-1956, 61 y.o.   MRN: [id number]  HPI: Mitchell [name] is a 61 y.o. male  Chief Complaint  Patient presents with  . Follow-up   HYPERTENSION Hypertension status: stable  Satisfied with current treatment? yes Duration of hypertension: chronic BP monitoring frequency:  not checking BP medication side effects:  no Medication compliance: excellent compliance Previous BP meds: lisinopril Aspirin: yes Recurrent headaches: no Visual changes: no Palpitations: no Dyspnea: no Chest pain: no Lower extremity edema: no Dizzy/lightheaded: no  Relevant past medical, surgical, family and social history reviewed and updated as indicated. Interim medical history since our last visit reviewed. Allergies and medications reviewed and updated.  Review of Systems  Constitutional: Negative.   Respiratory: Negative.   Cardiovascular: Negative.   Musculoskeletal: Positive for arthralgias. Negative for back pain, gait problem, joint swelling, myalgias, neck pain and neck stiffness.  Psychiatric/Behavioral: Negative.     Per HPI unless specifically indicated above     Objective:    BP 117/70   Pulse 69   Ht 5\' 2"  (1.575 m)   Wt 168 lb (76.2 kg)   SpO2 97%   BMI 30.73 kg/m   Wt Readings from Last 3 Encounters:  10/13/16 168 lb (76.2 kg)  08/24/16 164 lb (74.4 kg)  07/21/16 165 lb (74.8 kg)    Physical Exam  Constitutional: He is oriented to person, place, and time. He appears well-developed and well-nourished. No distress.  HENT:  Head: Normocephalic and atraumatic.  Right Ear: Hearing normal.  Left Ear: Hearing normal.  Nose: Nose normal.  Eyes: Conjunctivae and lids are normal. Right eye exhibits no discharge. Left eye exhibits no discharge. No scleral icterus.  Cardiovascular: Normal rate, regular rhythm, normal heart  sounds and intact distal pulses.  Exam reveals no gallop and no friction rub.   No murmur heard. Pulmonary/Chest: Effort normal and breath sounds normal. No respiratory distress. He has no wheezes. He has no rales. He exhibits no tenderness.  Musculoskeletal: Normal range of motion.  Neurological: He is alert and oriented to person, place, and time.  Skin: Skin is warm, dry and intact. No rash noted. He is not diaphoretic. No erythema. No pallor.  Psychiatric: He has a normal mood and affect. His speech is normal and behavior is normal. Judgment and thought content normal. Cognition and memory are normal.  Nursing note and vitals reviewed.   Results for orders placed or performed in visit on 07/04/16  TSH  Result Value Ref Range   TSH 3.620 0.450 - 4.500 uIU/mL  Basic metabolic panel  Result Value Ref Range   Glucose 136 (H) 65 - 99 mg/dL   BUN 19 8 - 27 mg/dL   Creatinine, Ser 1.28 (H) 0.76 - 1.27 mg/dL   GFR calc non Af Amer 60 >59 mL/min/1.73   GFR calc Af Amer 69 >59 mL/min/1.73   BUN/Creatinine Ratio 15 10 - 24   Sodium 138 134 - 144 mmol/L   Potassium 4.2 3.5 - 5.2 mmol/L   Chloride 102 96 - 106 mmol/L   CO2 23 18 - 29 mmol/L   Calcium 9.1 8.6 - 10.2 mg/dL  HCV RT-PCR, Quant (Non-Graph)  Result Value Ref Range   HCV Quantitative Comment Copies/mL  HCV Quantitative Log CANCELED Log10copy/mL   HCV QUANTITATIVE BASELINE Less than 2 IU/mL. IU/mL   IU log10 CANCELED Log10 IU/mL   PCR AMPLIFICATION+DETECTION Comment    TEST INFORMATION Comment       Assessment & Plan:   Problem List Items Addressed This Visit      Cardiovascular and Mediastinum   Hypertension    Under good control. Continue current regimen. Call with any concerns.           Follow up plan: Return in about 3 months (around 01/11/2017) for Wellness exam.

## 2016-10-13 NOTE — Assessment & Plan Note (Signed)
Under good control. Continue current regimen. Call with any concerns.  

## 2016-11-26 ENCOUNTER — Emergency Department: Payer: Medicare HMO

## 2016-11-26 ENCOUNTER — Emergency Department
Admission: EM | Admit: 2016-11-26 | Discharge: 2016-11-26 | Disposition: A | Discharge status: Home / Self Care | Payer: Medicare HMO | Attending: Emergency Medicine | Admitting: Emergency Medicine

## 2016-11-26 ENCOUNTER — Encounter: Payer: Self-pay | Admitting: Emergency Medicine

## 2016-11-26 DIAGNOSIS — E039 Hypothyroidism, unspecified: Secondary | ICD-10-CM | POA: Insufficient documentation

## 2016-11-26 DIAGNOSIS — M7541 Impingement syndrome of right shoulder: Secondary | ICD-10-CM | POA: Insufficient documentation

## 2016-11-26 DIAGNOSIS — M542 Cervicalgia: Secondary | ICD-10-CM | POA: Diagnosis not present

## 2016-11-26 DIAGNOSIS — Z85828 Personal history of other malignant neoplasm of skin: Secondary | ICD-10-CM | POA: Insufficient documentation

## 2016-11-26 DIAGNOSIS — I10 Essential (primary) hypertension: Secondary | ICD-10-CM | POA: Insufficient documentation

## 2016-11-26 DIAGNOSIS — Z7982 Long term (current) use of aspirin: Secondary | ICD-10-CM | POA: Diagnosis not present

## 2016-11-26 DIAGNOSIS — Z87891 Personal history of nicotine dependence: Secondary | ICD-10-CM | POA: Insufficient documentation

## 2016-11-26 DIAGNOSIS — Z79899 Other long term (current) drug therapy: Secondary | ICD-10-CM | POA: Diagnosis not present

## 2016-11-26 DIAGNOSIS — M25511 Pain in right shoulder: Secondary | ICD-10-CM | POA: Diagnosis present

## 2016-11-26 MED ORDER — KETOROLAC TROMETHAMINE 10 MG PO TABS: 10.0000 mg | ORAL_TABLET | Freq: Four times a day (QID) | ORAL | 0 refills | Status: DC | PRN

## 2016-11-26 MED ORDER — KETOROLAC TROMETHAMINE 60 MG/2ML IM SOLN
30.0000 mg | Freq: Once | INTRAMUSCULAR | Status: AC
  Administered 2016-11-26: 30 mg via INTRAMUSCULAR
  Filled 2016-11-26: qty 2

## 2016-11-26 MED ORDER — OXYCODONE-ACETAMINOPHEN 5-325 MG PO TABS: 1.0000 | ORAL_TABLET | ORAL | 0 refills | Status: DC | PRN

## 2016-11-26 MED ORDER — PREDNISONE 10 MG (21) PO TBPK: ORAL_TABLET | ORAL | 0 refills | Status: DC

## 2016-11-26 NOTE — ED Triage Notes (Signed)
Pt to ed with c/o right shoulder pain and neck pain for several months.

## 2016-11-26 NOTE — ED Notes (Signed)
Pt presents to ED with c/o R shoulder and neck pain. Pt states was dx with osteoarthritis in the joint a few months ago "at this [facility]". Pt c/o pain without any known injury.

## 2016-11-26 NOTE — ED Notes (Signed)
NAD noted at time of D/C. Pt denies questions or concerns. Pt ambulatory to the lobby at this time.  

## 2016-11-26 NOTE — ED Provider Notes (Signed)
[facility] Emergency Department Provider Note ____________________________________________  Time seen: Approximately 6:19 PM  I have reviewed the triage vital signs and the nursing notes.   HISTORY  Chief Complaint Shoulder Pain    HPI Mitchell [name] is a 61 y.o. male who presents to the emergency department for evaluation of right shoulder and neck pain for the past several months. He has worked as a Curator for 13 years. No specific injury. He has been evaluated here and had x-ray showing "arthritis." Pain is worse.He has a states that he has some pain in the lower cervical spine and right side of his neck. He denies specific injury.  Past Medical History:  Diagnosis Date  . Anxiety   . Cancer ([place])    skin cancer  . Depression   . Epididymitis   . Hepatitis C    Tested positive, treated now told that he does not have it  . Hydrocele   . Hypertension   . IFG (impaired fasting glucose)   . Insomnia   . Kidney stone   . Schizophrenia [facility])     Patient Active Problem List   Diagnosis Date Noted  . Special screening for malignant neoplasms, colon   . Problems with swallowing and mastication   . Gastritis   . Swollen testicle 08/14/2015  . BPH with obstruction/lower urinary tract symptoms 08/14/2015  . Hyperlipidemia 07/22/2015  . Hypothyroidism 07/17/2015  . BPH (benign prostatic hyperplasia) 07/16/2015  . Scrotal swelling 07/16/2015  . Hydrocele, right 07/08/2015  . Drug-induced erectile dysfunction 07/08/2015  . IFG (impaired fasting glucose)   . Schizophrenia ([place])   . Insomnia   . Anxiety   . Hypertension   . Depression   . Hepatitis C     Past Surgical History:  Procedure Laterality Date  . ANKLE FRACTURE SURGERY    . COLONOSCOPY  2010  . COLONOSCOPY WITH PROPOFOL  06/07/2016   Procedure: COLONOSCOPY WITH PROPOFOL;  Surgeon: [name], MD;  Location: [facility] ENDOSCOPY;  Service: Endoscopy;;  . ESOPHAGOGASTRODUODENOSCOPY (EGD) WITH  PROPOFOL N/A 06/07/2016   Procedure: ESOPHAGOGASTRODUODENOSCOPY (EGD) WITH PROPOFOL;  Surgeon: [name], MD;  Location: [facility] ENDOSCOPY;  Service: Endoscopy;  Laterality: N/A;  . HERNIA REPAIR  2011   X 2    Prior to Admission medications   Medication Sig Start Date End Date Taking? Authorizing Provider  aspirin EC 81 MG tablet Take 1 tablet (81 mg total) by mouth daily. 06/08/15   Megan P Johnson, DO  cetirizine (ZYRTEC) 5 MG tablet Take 5 mg by mouth daily. Reported on 04/01/2016    Historical Provider, MD  clonazePAM (KLONOPIN) 0.5 MG tablet Take 0.5 mg by mouth 3 (three) times daily as needed for anxiety.    Historical Provider, MD  docusate sodium (COLACE) 100 MG capsule Take 2 capsules (200 mg total) by mouth 2 (two) times daily. 01/01/16   Carrie Mew, MD  haloperidol (HALDOL) 1 MG tablet Take 1 mg by mouth daily. 06/22/16   Historical Provider, MD  ketorolac (TORADOL) 10 MG tablet Take 1 tablet (10 mg total) by mouth every 6 (six) hours as needed. 11/26/16   Victorino Dike, FNP  levothyroxine (SYNTHROID, LEVOTHROID) 50 MCG tablet Take 1 tablet (50 mcg total) by mouth daily before breakfast. 07/05/16   Megan P Johnson, DO  lisinopril (PRINIVIL,ZESTRIL) 5 MG tablet Take 1.5 tablets (7.5 mg total) by mouth daily. 07/04/16   Megan P Johnson, DO  naproxen (NAPROSYN) 500 MG tablet Take 1 tablet (  500 mg total) by mouth 2 (two) times daily with a meal. 07/21/16   Sable Feil, PA-C  omega-3 acid ethyl esters (LOVAZA) 1 g capsule Take 2 capsules (2 g total) by mouth 2 (two) times daily. Patient not taking: Reported on 10/13/2016 09/16/16   Valerie Roys, DO  oxyCODONE-acetaminophen (ROXICET) 5-325 MG tablet Take 1 tablet by mouth every 4 (four) hours as needed for severe pain. 11/26/16   Victorino Dike, FNP  predniSONE (STERAPRED UNI-PAK 21 TAB) 10 MG (21) TBPK tablet Take 6 tablets on day 1 Take 5 tablets on day 2 Take 4 tablets on day 3 Take 3 tablets on day 4 Take 2 tablets on day  5 Take 1 tablet on day 6 11/26/16   Tejal Monroy B Saahil Herbster, FNP  rosuvastatin (CRESTOR) 10 MG tablet TAKE 1 TABLET BY MOUTH ONCE DAILY 09/26/16   Guadalupe Maple, MD  sildenafil (VIAGRA) 100 MG tablet Take 1 tablet (100 mg total) by mouth daily as needed for erectile dysfunction. 07/08/15   Megan P Johnson, DO  traMADol (ULTRAM) 50 MG tablet Take 1 tablet (50 mg total) by mouth every 12 (twelve) hours as needed. 07/21/16   Sable Feil, PA-C    Allergies Atorvastatin and Codeine  Family History  Problem Relation Age of Onset  . Diabetes Mother   . Alzheimer's disease Father   . Diabetes Brother   . Diabetes Maternal Grandfather     Social History Social History  Substance Use Topics  . Smoking status: Former Smoker    Quit date: 07/06/1995  . Smokeless tobacco: Former Systems developer  . Alcohol use No     Comment: remote history, quit 20+ years ago    Review of Systems Constitutional: No recent illness. Cardiovascular: Denies chest pain or palpitations. Respiratory: Denies shortness of breath. Musculoskeletal: Pain in right shoulder and neck. Skin: Negative for rash, wound, lesion. Neurological: Negative for focal weakness or numbness.  ____________________________________________   PHYSICAL EXAM:  VITAL SIGNS: ED Triage Vitals  Enc Vitals Group     BP 11/26/16 1750 (!) 163/84     Pulse Rate 11/26/16 1750 76     Resp 11/26/16 1750 20     Temp 11/26/16 1750 97.6 F (36.4 C)     Temp Source 11/26/16 1750 Oral     SpO2 11/26/16 1750 97 %     Weight 11/26/16 1750 168 lb (76.2 kg)     Height --      Head Circumference --      Peak Flow --      Pain Score 11/26/16 1747 9     Pain Loc --      Pain Edu? --      Excl. in [place]? --     Constitutional: Alert and oriented. Well appearing and in no acute distress. Eyes: Conjunctivae are normal. EOMI. Head: Atraumatic. Neck: No stridor.  Respiratory: Normal respiratory effort.   Musculoskeletal: Midline lower cervical spine tenderness on  palpation, paraspinal tenderness on the right side upon palpation, full range of motion of the right shoulder with reported increase in pain on AB duction and internal rotation. Neurologic:  Normal speech and language. No gross focal neurologic deficits are appreciated. Speech is normal. No gait instability. Skin:  Skin is warm, dry and intact. Atraumatic. Psychiatric: Mood and affect are normal. Speech and behavior are normal.  ____________________________________________   LABS (all labs ordered are listed, but only abnormal results are displayed)  Labs Reviewed - No data  to display ____________________________________________  RADIOLOGY  Cervical spine negative for acute bony abnormality, mild degenerative changes are reported at C3-C4 and C5-C6. ____________________________________________   PROCEDURES  Procedure(s) performed: None  ____________________________________________   INITIAL IMPRESSION / ASSESSMENT AND PLAN / ED COURSE  61 year old male presenting to the emergency department for evaluation of right shoulder pain. The patient's symptoms and exam are consistent with right shoulder impingement syndrome. While in the emergency department he was given an injection of Toradol for which she states that there was no relief. He did not have a driver therefore could not receive any type of muscle relaxer or narcotic pain medication while here. He was given prescriptions for Toradol, prednisone, and Percocet. He was encouraged to fill the prescriptions on his way home and take them only as prescribed. He was instructed to call on Monday to schedule an appointment with orthopedics. He was instructed to follow-up with his primary care provider if he is unable to see the orthopedist and has pain is not improving with the medications. Advised to return to the emergency room for symptoms that change or worsen if he is unable schedule appointment with a specialist or his primary care  provider.  Pertinent labs & imaging results that were available during my care of the patient were reviewed by me and considered in my medical decision making (see chart for details).  _________________________________________   FINAL CLINICAL IMPRESSION(S) / ED DIAGNOSES  Final diagnoses:  Shoulder impingement syndrome, right    Discharge Medication List as of 11/26/2016  7:49 PM    START taking these medications   Details  ketorolac (TORADOL) 10 MG tablet Take 1 tablet (10 mg total) by mouth every 6 (six) hours as needed., Starting Sat 11/26/2016, Print    oxyCODONE-acetaminophen (ROXICET) 5-325 MG tablet Take 1 tablet by mouth every 4 (four) hours as needed for severe pain., Starting Sat 11/26/2016, Print    predniSONE (STERAPRED UNI-PAK 21 TAB) 10 MG (21) TBPK tablet Take 6 tablets on day 1 Take 5 tablets on day 2 Take 4 tablets on day 3 Take 3 tablets on day 4 Take 2 tablets on day 5 Take 1 tablet on day 6, Print        If controlled substance prescribed during this visit, 12 month history viewed on the [facility] prior to issuing an initial prescription for Schedule II or III opiod.    Victorino Dike, FNP 11/27/16 [id number]    Earleen Newport, MD 11/27/16 [phone]

## 2016-11-26 NOTE — Discharge Instructions (Signed)
Please call and schedule a follow up with the orthopedic doctor. Take the medications as prescribed. Return to the ER for symptoms that change or worsen if unable to schedule an appointment.

## 2016-11-29 ENCOUNTER — Other Ambulatory Visit: Payer: Self-pay | Admitting: Family Medicine

## 2016-11-29 DIAGNOSIS — I10 Essential (primary) hypertension: Secondary | ICD-10-CM

## 2016-12-02 DIAGNOSIS — M13811 Other specified arthritis, right shoulder: Secondary | ICD-10-CM | POA: Diagnosis not present

## 2017-01-06 ENCOUNTER — Encounter: Payer: Self-pay | Admitting: Emergency Medicine

## 2017-01-06 ENCOUNTER — Emergency Department
Admission: EM | Admit: 2017-01-06 | Discharge: 2017-01-06 | Disposition: A | Discharge status: Home / Self Care | Payer: Medicare HMO | Attending: Emergency Medicine | Admitting: Emergency Medicine

## 2017-01-06 ENCOUNTER — Emergency Department: Payer: Medicare HMO

## 2017-01-06 DIAGNOSIS — I10 Essential (primary) hypertension: Secondary | ICD-10-CM | POA: Insufficient documentation

## 2017-01-06 DIAGNOSIS — Z87891 Personal history of nicotine dependence: Secondary | ICD-10-CM | POA: Diagnosis not present

## 2017-01-06 DIAGNOSIS — R0789 Other chest pain: Secondary | ICD-10-CM | POA: Diagnosis not present

## 2017-01-06 DIAGNOSIS — Z79899 Other long term (current) drug therapy: Secondary | ICD-10-CM | POA: Insufficient documentation

## 2017-01-06 DIAGNOSIS — R079 Chest pain, unspecified: Secondary | ICD-10-CM | POA: Diagnosis not present

## 2017-01-06 LAB — TROPONIN I
Troponin I: <0.03 ng/mL (ref <0.03)
Troponin I: <0.03 ng/mL (ref <0.03)

## 2017-01-06 LAB — CBC
HCT: 44.5 % (ref 40.0–52.0)
HEMOGLOBIN: 15.3 g/dL (ref 13.0–18.0)
MCH: 32.6 pg (ref 26.0–34.0)
MCHC: 34.4 g/dL (ref 32.0–36.0)
MCV: 94.9 fL (ref 80.0–100.0)
PLATELETS: 288 10*3/uL (ref 150–440)
RBC: 4.69 MIL/uL (ref 4.40–5.90)
RDW: 13.8 % (ref 11.5–14.5)
WBC: 8.3 10*3/uL (ref 3.8–10.6)

## 2017-01-06 LAB — BASIC METABOLIC PANEL
Anion gap: 9 (ref 5–15)
BUN: 25 mg/dL — ABNORMAL HIGH (ref 6–20)
CHLORIDE: 107 mmol/L (ref 101–111)
CO2: 23 mmol/L (ref 22–32)
CREATININE: 1.14 mg/dL (ref 0.61–1.24)
Calcium: 9.1 mg/dL (ref 8.9–10.3)
GFR calc non Af Amer: >60 mL/min (ref >60)
GLUCOSE: 137 mg/dL — AB (ref 65–99)
Potassium: 4.1 mmol/L (ref 3.5–5.1)
Sodium: 139 mmol/L (ref 135–145)

## 2017-01-06 MED ORDER — IBUPROFEN 600 MG PO TABS
600.0000 mg | ORAL_TABLET | Freq: Once | ORAL | Status: AC
  Administered 2017-01-06: 600 mg via ORAL
  Filled 2017-01-06: qty 1

## 2017-01-06 NOTE — ED Notes (Signed)
Pt states he has to pick his wife up at 9:30 and is hoping to not stay here long/ I told the patient that it is impossible for me to give a time as to when he may leave based on the situation/the patient understands and will attempt to make other arrangments. Pt states that he really just wanted to know if he had a heart attack. Our typically chest pain protocol was explained to the patient. Pt refused for me to put EKG leads on him at this time as he sts. "I dont feel its necessary"/ RN made aware at this time

## 2017-01-06 NOTE — ED Notes (Signed)
ED Provider at bedside. 

## 2017-01-06 NOTE — ED Triage Notes (Signed)
Patient presents to the ED with left sided chest pain intermittently since yesterday evening.  Patient reports pain radiating down his left arm with numbness and tingling.  Patient denies shortness of breath, diaphoresis, dizziness and weakness.  Patient reports he is very active and states, "I feel like it could be a pulled muscle."  Patient reports pain is not re-producible.

## 2017-01-06 NOTE — ED Provider Notes (Signed)
[facility] Emergency Department Provider Note  ____________________________________________   I have reviewed the triage vital signs and the nursing notes.   HISTORY  Chief Complaint Chest Pain    HPI Mitchell [name] is a 61 y.o. male who has a history of borderline hypertension and schizophrenia as well as anxiety, presents today complaining of left sided chest wall pain. He thinks he injured it going up and down a ladder. He does go up and down ladders at work. He denies any exertional symptoms. He states in fact while he is exerting himself he does not notice the pain appears only when he is at rest or moving his arm the wrong way when it hurts. There is some discomfort going towards the left shoulder along the pectoralis muscle. He denies any pleuritic pain personal or family history of PE or DVT recent travel or leg swelling. HEENT was gradual in onset. His been off and on whenever he notices it since yesterday. No recollected obvious injury. Did not fall off a ladder for example. Pain does not radiate to the back and was not maximum intensity at onset. It is a mild discomfort which feels like "a muscle pull".      Past Medical History:  Diagnosis Date  . Anxiety   . Cancer ([place])    skin cancer  . Depression   . Epididymitis   . Hepatitis C    Tested positive, treated now told that he does not have it  . Hydrocele   . Hypertension   . IFG (impaired fasting glucose)   . Insomnia   . Kidney stone   . Schizophrenia [facility])     Patient Active Problem List   Diagnosis Date Noted  . Special screening for malignant neoplasms, colon   . Problems with swallowing and mastication   . Gastritis   . Swollen testicle 08/14/2015  . BPH with obstruction/lower urinary tract symptoms 08/14/2015  . Hyperlipidemia 07/22/2015  . Hypothyroidism 07/17/2015  . BPH (benign prostatic hyperplasia) 07/16/2015  . Scrotal swelling 07/16/2015  . Hydrocele, right 07/08/2015   . Drug-induced erectile dysfunction 07/08/2015  . IFG (impaired fasting glucose)   . Schizophrenia ([place])   . Insomnia   . Anxiety   . Hypertension   . Depression   . Hepatitis C     Past Surgical History:  Procedure Laterality Date  . ANKLE FRACTURE SURGERY    . COLONOSCOPY  2010  . COLONOSCOPY WITH PROPOFOL  06/07/2016   Procedure: COLONOSCOPY WITH PROPOFOL;  Surgeon: [name], MD;  Location: [facility] ENDOSCOPY;  Service: Endoscopy;;  . ESOPHAGOGASTRODUODENOSCOPY (EGD) WITH PROPOFOL N/A 06/07/2016   Procedure: ESOPHAGOGASTRODUODENOSCOPY (EGD) WITH PROPOFOL;  Surgeon: [name], MD;  Location: [facility] ENDOSCOPY;  Service: Endoscopy;  Laterality: N/A;  . HERNIA REPAIR  2011   X 2    Prior to Admission medications   Medication Sig Start Date End Date Taking? Authorizing Provider  aspirin EC 81 MG tablet Take 1 tablet (81 mg total) by mouth daily. 06/08/15   Megan P Johnson, DO  cetirizine (ZYRTEC) 5 MG tablet Take 5 mg by mouth daily. Reported on 04/01/2016    Historical Provider, MD  clonazePAM (KLONOPIN) 0.5 MG tablet Take 0.5 mg by mouth 3 (three) times daily as needed for anxiety.    Historical Provider, MD  docusate sodium (COLACE) 100 MG capsule Take 2 capsules (200 mg total) by mouth 2 (two) times daily. 01/01/16   Carrie Mew, MD  haloperidol (HALDOL) 1 MG  tablet Take 1 mg by mouth daily. 06/22/16   Historical Provider, MD  ketorolac (TORADOL) 10 MG tablet Take 1 tablet (10 mg total) by mouth every 6 (six) hours as needed. 11/26/16   Victorino Dike, FNP  levothyroxine (SYNTHROID, LEVOTHROID) 50 MCG tablet Take 1 tablet (50 mcg total) by mouth daily before breakfast. 07/05/16   Megan P Johnson, DO  lisinopril (PRINIVIL,ZESTRIL) 5 MG tablet TAKE 1 & 1/2 TABLETS (7.5 MG) BY MOUTH ONCE DAILY 11/29/16   Megan P Johnson, DO  naproxen (NAPROSYN) 500 MG tablet Take 1 tablet (500 mg total) by mouth 2 (two) times daily with a meal. 07/21/16   Sable Feil, PA-C  omega-3 acid ethyl esters  (LOVAZA) 1 g capsule Take 2 capsules (2 g total) by mouth 2 (two) times daily. Patient not taking: Reported on 10/13/2016 09/16/16   Valerie Roys, DO  oxyCODONE-acetaminophen (ROXICET) 5-325 MG tablet Take 1 tablet by mouth every 4 (four) hours as needed for severe pain. 11/26/16   Victorino Dike, FNP  predniSONE (STERAPRED UNI-PAK 21 TAB) 10 MG (21) TBPK tablet Take 6 tablets on day 1 Take 5 tablets on day 2 Take 4 tablets on day 3 Take 3 tablets on day 4 Take 2 tablets on day 5 Take 1 tablet on day 6 11/26/16   Cari B Triplett, FNP  rosuvastatin (CRESTOR) 10 MG tablet TAKE 1 TABLET BY MOUTH ONCE DAILY 09/26/16   Guadalupe Maple, MD  sildenafil (VIAGRA) 100 MG tablet Take 1 tablet (100 mg total) by mouth daily as needed for erectile dysfunction. 07/08/15   Megan P Johnson, DO  traMADol (ULTRAM) 50 MG tablet Take 1 tablet (50 mg total) by mouth every 12 (twelve) hours as needed. 07/21/16   Sable Feil, PA-C    Allergies Atorvastatin and Codeine  Family History  Problem Relation Age of Onset  . Diabetes Mother   . Alzheimer's disease Father   . Diabetes Brother   . Diabetes Maternal Grandfather     Social History Social History  Substance Use Topics  . Smoking status: Former Smoker    Quit date: 07/06/1995  . Smokeless tobacco: Former Systems developer  . Alcohol use Yes     Comment: remote history, quit 20+ years ago (1 beer q 2-3 days)    Review of Systems Constitutional: No fever/chills Eyes: No visual changes. ENT: No sore throat. No stiff neck no neck pain Cardiovascular: Positive chest pain. Respiratory: Denies shortness of breath. Gastrointestinal:   no vomiting.  No diarrhea.  No constipation. Genitourinary: Negative for dysuria. Musculoskeletal: Negative lower extremity swelling Skin: Negative for rash. Neurological: Negative for severe headaches, focal weakness or numbness. 10-point ROS otherwise negative.  ____________________________________________   PHYSICAL  EXAM:  VITAL SIGNS: ED Triage Vitals [01/06/17 1900]  Enc Vitals Group     BP (!) 141/77     Pulse Rate 74     Resp 18     Temp 98.2 F (36.8 C)     Temp Source Oral     SpO2 95 %     Weight 165 lb (74.8 kg)     Height 5\' 2"  (1.575 m)     Head Circumference      Peak Flow      Pain Score 7     Pain Loc      Pain Edu?      Excl. in [place]?     Constitutional: Alert and oriented. Well appearing and in no acute  distress. Eyes: Conjunctivae are normal. PERRL. EOMI. Head: Atraumatic. Nose: No congestion/rhinnorhea. Mouth/Throat: Mucous membranes are moist.  Oropharynx non-erythematous. Neck: No stridor.   Nontender with no meningismus Cardiovascular: Normal rate, regular rhythm. Grossly normal heart sounds.  Good peripheral circulation. Respiratory: Normal respiratory effort.  No retractions. Lungs CTAB. Chest: Tender to palpation left chest wall when I touch this area patient states "ouch that's the pain right there" and pulls back. There is no crepitus there is no flail chest, there is no palpated rib fracture. There is no shingles or herpetic lesion noted. Not erythematous not red is not hot to touch Abdominal: Soft and nontender. No distention. No guarding no rebound Back:  There is no focal tenderness or step off.  there is no midline tenderness there are no lesions noted. there is no CVA tenderness Musculoskeletal: No lower extremity tenderness, no upper extremity tenderness. No joint effusions, no DVT signs strong distal pulses no edema Neurologic:  Normal speech and language. No gross focal neurologic deficits are appreciated.  Skin:  Skin is warm, dry and intact. No rash noted. Psychiatric: Mood and affect are normal. Speech and behavior are normal.  ____________________________________________   LABS (all labs ordered are listed, but only abnormal results are displayed)  Labs Reviewed  BASIC METABOLIC PANEL - Abnormal; Notable for the following:       Result Value    Glucose, Bld 137 (*)    BUN 25 (*)    All other components within normal limits  CBC  TROPONIN I  TROPONIN I   ____________________________________________  EKG  I personally interpreted any EKGs ordered by me or triage Normal sinus rhythm rate 73 bpm no acute estimation acute ST depression nonspecific ST changes ____________________________________________  RADIOLOGY  I reviewed any imaging ordered by me or triage that were performed during my shift and, if possible, patient and/or family made aware of any abnormal findings. ____________________________________________   PROCEDURES  Procedure(s) performed: None  Procedures  Critical Care performed: None  ____________________________________________   INITIAL IMPRESSION / ASSESSMENT AND PLAN / ED COURSE  Pertinent labs & imaging results that were available during my care of the patient were reviewed by me and considered in my medical decision making (see chart for details).  Patient here with very reproducible chest wall pain which she believes to be a muscle pull. Pain since the day before yesterday. Despite this, negative troponin.At this time, there does not appear to be clinical evidence to support the diagnosis of pulmonary embolus, dissection, myocarditis, endocarditis, pericarditis, pericardial tamponade, acute coronary syndrome, pneumothorax, pneumonia, or any other acute intrathoracic pathology that will require admission or acute intervention. Nor is there evidence of any significant intra-abdominal pathology causing this discomfort. We will, Sunday second troponin as a precaution although given the very reducible nature of the pain and the fact that is really not exertional etc. have low suspicion that the patient will require emergent cardiac catheterization or admission. Patient very comfortable with this plan.    ____________________________________________   FINAL CLINICAL IMPRESSION(S) / ED  DIAGNOSES  Final diagnoses:  None      This chart was dictated using voice recognition software.  Despite best efforts to proofread,  errors can occur which can change meaning.      Schuyler Amor, MD 01/06/17 2157

## 2017-01-10 ENCOUNTER — Other Ambulatory Visit: Payer: Self-pay | Admitting: Family Medicine

## 2017-01-12 ENCOUNTER — Ambulatory Visit (INDEPENDENT_AMBULATORY_CARE_PROVIDER_SITE_OTHER): Payer: Medicare HMO | Admitting: Family Medicine

## 2017-01-12 ENCOUNTER — Encounter: Payer: Self-pay | Admitting: Family Medicine

## 2017-01-12 VITALS — BP 135/89 | HR 79 | Temp 98.8°F | Ht 62.0 in | Wt 164.4 lb

## 2017-01-12 DIAGNOSIS — N138 Other obstructive and reflux uropathy: Secondary | ICD-10-CM | POA: Diagnosis not present

## 2017-01-12 DIAGNOSIS — R7301 Impaired fasting glucose: Secondary | ICD-10-CM

## 2017-01-12 DIAGNOSIS — I10 Essential (primary) hypertension: Secondary | ICD-10-CM | POA: Diagnosis not present

## 2017-01-12 DIAGNOSIS — E782 Mixed hyperlipidemia: Secondary | ICD-10-CM

## 2017-01-12 DIAGNOSIS — F209 Schizophrenia, unspecified: Secondary | ICD-10-CM

## 2017-01-12 DIAGNOSIS — Z0000 Encounter for general adult medical examination without abnormal findings: Secondary | ICD-10-CM

## 2017-01-12 DIAGNOSIS — N401 Enlarged prostate with lower urinary tract symptoms: Secondary | ICD-10-CM

## 2017-01-12 DIAGNOSIS — E038 Other specified hypothyroidism: Secondary | ICD-10-CM

## 2017-01-12 DIAGNOSIS — B1920 Unspecified viral hepatitis C without hepatic coma: Secondary | ICD-10-CM | POA: Diagnosis not present

## 2017-01-12 LAB — UA/M W/RFLX CULTURE, ROUTINE
Bilirubin, UA: NEGATIVE
GLUCOSE, UA: NEGATIVE
KETONES UA: NEGATIVE
LEUKOCYTES UA: NEGATIVE
NITRITE UA: NEGATIVE
Protein, UA: NEGATIVE
RBC, UA: NEGATIVE
Urobilinogen, Ur: 0.2 mg/dL (ref 0.2–1.0)
pH, UA: 6 (ref 5.0–7.5)

## 2017-01-12 LAB — MICROSCOPIC EXAMINATION
BACTERIA UA: NONE SEEN
RBC, UA: NONE SEEN /hpf (ref 0–?)
WBC UA: NONE SEEN /HPF (ref 0–?)

## 2017-01-12 LAB — MICROALBUMIN, URINE WAIVED
CREATININE, URINE WAIVED: 300 mg/dL (ref 10–300)
Microalb, Ur Waived: 30 mg/L — ABNORMAL HIGH (ref 0–19)

## 2017-01-12 LAB — BAYER DCA HB A1C WAIVED: HB A1C (BAYER DCA - WAIVED): 5.9 % (ref <7.0)

## 2017-01-12 MED ORDER — ROSUVASTATIN CALCIUM 10 MG PO TABS: 10.0000 mg | ORAL_TABLET | Freq: Every day | ORAL | 0 refills | Status: DC

## 2017-01-12 MED ORDER — LISINOPRIL 5 MG PO TABS: ORAL_TABLET | ORAL | 1 refills | Status: DC

## 2017-01-12 NOTE — Assessment & Plan Note (Signed)
Continue current regimen. Call with any concerns. Refill given today.

## 2017-01-12 NOTE — Assessment & Plan Note (Signed)
Will recheck PSA. Await results. Call with any concerns.

## 2017-01-12 NOTE — Assessment & Plan Note (Signed)
Under good control. Continue current regimen and recheck 6 months.

## 2017-01-12 NOTE — Progress Notes (Signed)
BP 135/89 (BP Location: Left Arm, Patient Position: Sitting, Cuff Size: Large)   Pulse 79   Temp 98.8 F (37.1 C)   Ht 5\' 2"  (1.575 m)   Wt 164 lb 6.4 oz (74.6 kg)   SpO2 96%   BMI 30.07 kg/m    Subjective:    Patient ID: Mitchell [name], male    DOB: July 23, 1956, 61 y.o.   MRN: [id number]  HPI: Mitchell [name] is a 61 y.o. male presenting on 01/12/2017 for comprehensive medical examination. Current medical complaints include:  HYPERTENSION / HYPERLIPIDEMIA Satisfied with current treatment? yes Duration of hypertension: chronic BP monitoring frequency: not checking BP medication side effects: no Past BP meds: lisinopril Duration of hyperlipidemia: chronic Cholesterol medication side effects: no Cholesterol supplements: none Past cholesterol medications: atorvastain (lipitor) and rosuvastatin (crestor), lovaza Medication compliance: excellent compliance Aspirin: no Recent stressors: no Recurrent headaches: no Visual changes: no Palpitations: no Dyspnea: no Chest pain: no Lower extremity edema: no Dizzy/lightheaded: no  HYPOTHYROIDISM Thyroid control status:controlled Satisfied with current treatment? yes Medication side effects: no Medication compliance: excellent compliance Recent dose adjustment:no Fatigue: yes Cold intolerance: no Heat intolerance: no Weight gain: no Weight loss: no Constipation: yes Diarrhea/loose stools: no Palpitations: no Lower extremity edema: yes Anxiety/depressed mood: no  Impaired Fasting Glucose HbA1C:  Duration of elevated blood sugar: chronic Polydipsia: no Polyuria: no Weight change: no Visual disturbance: no Glucose Monitoring: no Diabetic Education: Not Completed Family history of diabetes: yes  He currently lives with: wife Interim Problems from his last visit: yes- went to the ER for chest pain after working on a ladder, feeling better now  Functional Status Survey: Is the patient deaf or have difficulty hearing?:  No Does the patient have difficulty seeing, even when wearing glasses/contacts?: No Does the patient have difficulty concentrating, remembering, or making decisions?: No Does the patient have difficulty walking or climbing stairs?: No Does the patient have difficulty dressing or bathing?: No Does the patient have difficulty doing errands alone such as visiting a doctor's office or shopping?: No  FALL RISK: Fall Risk  01/12/2017 10/13/2016 07/16/2015  Falls in the past year? No No No    Depression Screen Depression screen [facility] 2/9 01/12/2017 07/16/2015  Decreased Interest 0 0  Down, Depressed, Hopeless 0 0  PHQ - 2 Score 0 0    Advanced Directives See Appropriate Area of Chart  Past Medical History:  Past Medical History:  Diagnosis Date  . Anxiety   . Cancer ([place])    skin cancer  . Depression   . Epididymitis   . Hepatitis C    Tested positive, treated now told that he does not have it  . Hydrocele   . Hypertension   . IFG (impaired fasting glucose)   . Insomnia   . Kidney stone   . Schizophrenia [facility])     Surgical History:  Past Surgical History:  Procedure Laterality Date  . ANKLE FRACTURE SURGERY    . COLONOSCOPY  2010  . COLONOSCOPY WITH PROPOFOL  06/07/2016   Procedure: COLONOSCOPY WITH PROPOFOL;  Surgeon: [name], MD;  Location: [facility] ENDOSCOPY;  Service: Endoscopy;;  . ESOPHAGOGASTRODUODENOSCOPY (EGD) WITH PROPOFOL N/A 06/07/2016   Procedure: ESOPHAGOGASTRODUODENOSCOPY (EGD) WITH PROPOFOL;  Surgeon: [name], MD;  Location: [facility] ENDOSCOPY;  Service: Endoscopy;  Laterality: N/A;  . HERNIA REPAIR  2011   X 2    Medications:  Current Outpatient Prescriptions on File Prior to Visit  Medication Sig  . aspirin EC 81  MG tablet Take 1 tablet (81 mg total) by mouth daily.  . cetirizine (ZYRTEC) 5 MG tablet Take 5 mg by mouth daily. Reported on 04/01/2016  . levothyroxine (SYNTHROID, LEVOTHROID) 50 MCG tablet Take 1 tablet (50 mcg total) by mouth daily before  breakfast.  . sildenafil (VIAGRA) 100 MG tablet Take 1 tablet (100 mg total) by mouth daily as needed for erectile dysfunction.  . clonazePAM (KLONOPIN) 0.5 MG tablet Take 0.5 mg by mouth 3 (three) times daily as needed for anxiety.  . docusate sodium (COLACE) 100 MG capsule Take 2 capsules (200 mg total) by mouth 2 (two) times daily. (Patient not taking: Reported on 01/12/2017)  . haloperidol (HALDOL) 1 MG tablet Take 1 mg by mouth daily.  Marland Kitchen ketorolac (TORADOL) 10 MG tablet Take 1 tablet (10 mg total) by mouth every 6 (six) hours as needed. (Patient not taking: Reported on 01/12/2017)  . naproxen (NAPROSYN) 500 MG tablet Take 1 tablet (500 mg total) by mouth 2 (two) times daily with a meal. (Patient not taking: Reported on 01/12/2017)  . omega-3 acid ethyl esters (LOVAZA) 1 g capsule Take 2 capsules (2 g total) by mouth 2 (two) times daily. (Patient not taking: Reported on 10/13/2016)  . oxyCODONE-acetaminophen (ROXICET) 5-325 MG tablet Take 1 tablet by mouth every 4 (four) hours as needed for severe pain. (Patient not taking: Reported on 01/12/2017)  . predniSONE (STERAPRED UNI-PAK 21 TAB) 10 MG (21) TBPK tablet Take 6 tablets on day 1 Take 5 tablets on day 2 Take 4 tablets on day 3 Take 3 tablets on day 4 Take 2 tablets on day 5 Take 1 tablet on day 6 (Patient not taking: Reported on 01/12/2017)  . traMADol (ULTRAM) 50 MG tablet Take 1 tablet (50 mg total) by mouth every 12 (twelve) hours as needed. (Patient not taking: Reported on 01/12/2017)   No current facility-administered medications on file prior to visit.     Allergies:  Allergies  Allergen Reactions  . Atorvastatin Other (See Comments)    myalgias  . Codeine Nausea And Vomiting    Social History:  Social History   Social History  . Marital status: Married    Spouse name: N/A  . Number of children: N/A  . Years of education: N/A   Occupational History  . Not on file.   Social History Main Topics  . Smoking status: Former  Smoker    Quit date: 07/06/1995  . Smokeless tobacco: Former Systems developer  . Alcohol use Yes     Comment: remote history, quit 20+ years ago (1 beer q 2-3 days)  . Drug use: No     Comment: remote history, quit 20= years ago  . Sexual activity: Yes   Other Topics Concern  . Not on file   Social History Narrative  . No narrative on file   History  Smoking Status  . Former Smoker  . Quit date: 07/06/1995  Smokeless Tobacco  . Former User   History  Alcohol Use  . Yes    Comment: remote history, quit 20+ years ago (1 beer q 2-3 days)    Family History:  Family History  Problem Relation Age of Onset  . Diabetes Mother   . Alzheimer's disease Father   . Diabetes Brother   . Diabetes Maternal Grandfather     Past medical history, surgical history, medications, allergies, family history and social history reviewed with patient today and changes made to appropriate areas of the chart.   Review  of Systems  Constitutional: Negative.   Eyes: Negative.   Respiratory: Positive for wheezing. Negative for cough, hemoptysis, sputum production and shortness of breath.   Cardiovascular: Positive for leg swelling. Negative for chest pain, palpitations, orthopnea, claudication and PND.  Gastrointestinal: Negative.   Genitourinary: Negative.   Musculoskeletal: Negative.   Skin: Negative.   Neurological: Negative.   Endo/Heme/Allergies: Positive for environmental allergies. Negative for polydipsia. Does not bruise/bleed easily.  Psychiatric/Behavioral: Negative.     All other ROS negative except what is listed above and in the HPI.      Objective:    BP 135/89 (BP Location: Left Arm, Patient Position: Sitting, Cuff Size: Large)   Pulse 79   Temp 98.8 F (37.1 C)   Ht 5\' 2"  (1.575 m)   Wt 164 lb 6.4 oz (74.6 kg)   SpO2 96%   BMI 30.07 kg/m   Wt Readings from Last 3 Encounters:  01/12/17 164 lb 6.4 oz (74.6 kg)  01/06/17 165 lb (74.8 kg)  11/26/16 168 lb (76.2 kg)     Hearing  Screening   125Hz  250Hz  500Hz  1000Hz  2000Hz  3000Hz  4000Hz  6000Hz  8000Hz   Right ear:   Fail Fail 40  40    Left ear:   Fail Fail 40  40      Visual Acuity Screening   Right eye Left eye Both eyes  Without correction: 20/40 20/25 20/20   With correction:       Physical Exam  Constitutional: He is oriented to person, place, and time. He appears well-developed and well-nourished. No distress.  HENT:  Head: Normocephalic and atraumatic.  Right Ear: Hearing and external ear normal.  Left Ear: Hearing and external ear normal.  Nose: Nose normal.  Mouth/Throat: Oropharynx is clear and moist. No oropharyngeal exudate.  Eyes: Conjunctivae, EOM and lids are normal. Pupils are equal, round, and reactive to light. Right eye exhibits no discharge. Left eye exhibits no discharge. No scleral icterus.  Neck: Normal range of motion. Neck supple. No JVD present. No tracheal deviation present. No thyromegaly present.  Cardiovascular: Normal rate, regular rhythm, normal heart sounds and intact distal pulses.  Exam reveals no gallop and no friction rub.   No murmur heard. Pulmonary/Chest: Effort normal and breath sounds normal. No stridor. No respiratory distress. He has no wheezes. He has no rales. He exhibits no tenderness.  Abdominal: Soft. Bowel sounds are normal. He exhibits no distension and no mass. There is no tenderness. There is no rebound and no guarding.  Genitourinary:  Genitourinary Comments: Penis and prostate exams deferred- sees urologist  Musculoskeletal: Normal range of motion. He exhibits no edema, tenderness or deformity.  Lymphadenopathy:    He has no cervical adenopathy.  Neurological: He is alert and oriented to person, place, and time. He has normal reflexes. He displays normal reflexes. No cranial nerve deficit. He exhibits normal muscle tone. Coordination normal.  Skin: Skin is warm, dry and intact. No rash noted. He is not diaphoretic. No erythema. No pallor.  Psychiatric: He has  a normal mood and affect. His speech is normal and behavior is normal. Judgment and thought content normal. Cognition and memory are normal.  Vitals reviewed.  6CIT Screen 01/12/2017  What Year? 0 points  What month? 0 points  What time? 0 points  Count back from 20 0 points  Months in reverse 0 points  Repeat phrase 4 points  Total Score 4     Results for orders placed or performed during the hospital encounter  of 34/74/25  Basic metabolic panel  Result Value Ref Range   Sodium 139 135 - 145 mmol/L   Potassium 4.1 3.5 - 5.1 mmol/L   Chloride 107 101 - 111 mmol/L   CO2 23 22 - 32 mmol/L   Glucose, Bld 137 (H) 65 - 99 mg/dL   BUN 25 (H) 6 - 20 mg/dL   Creatinine, Ser 1.14 0.61 - 1.24 mg/dL   Calcium 9.1 8.9 - 10.3 mg/dL   GFR calc non Af Amer >60 >60 mL/min   GFR calc Af Amer >60 >60 mL/min   Anion gap 9 5 - 15  CBC  Result Value Ref Range   WBC 8.3 3.8 - 10.6 K/uL   RBC 4.69 4.40 - 5.90 MIL/uL   Hemoglobin 15.3 13.0 - 18.0 g/dL   HCT 44.5 40.0 - 52.0 %   MCV 94.9 80.0 - 100.0 fL   MCH 32.6 26.0 - 34.0 pg   MCHC 34.4 32.0 - 36.0 g/dL   RDW 13.8 11.5 - 14.5 %   Platelets 288 150 - 440 K/uL  Troponin I  Result Value Ref Range   Troponin I <0.03 <0.03 ng/mL  Troponin I  Result Value Ref Range   Troponin I <0.03 <0.03 ng/mL      Assessment & Plan:   Problem List Items Addressed This Visit      Cardiovascular and Mediastinum   Hypertension    Under good control. Continue current regimen and recheck 6 months.       Relevant Medications   lisinopril (PRINIVIL,ZESTRIL) 5 MG tablet   rosuvastatin (CRESTOR) 10 MG tablet   Other Relevant Orders   CBC with Differential/Platelet   Comprehensive metabolic panel   Microalbumin, Urine Waived   UA/M w/rflx Culture, Routine     Digestive   Hepatitis C   Relevant Orders   CBC with Differential/Platelet   Comprehensive metabolic panel   UA/M w/rflx Culture, Routine     Endocrine   IFG (impaired fasting glucose)     A1c 5.9. Continue diet and exercise. Continue to monitor.       Relevant Orders   CBC with Differential/Platelet   Comprehensive metabolic panel   Microalbumin, Urine Waived   UA/M w/rflx Culture, Routine   Bayer DCA Hb A1c Waived   Hypothyroidism    Will check lab today and adjust as needed. Call with any concerns.       Relevant Orders   CBC with Differential/Platelet   Comprehensive metabolic panel   TSH   UA/M w/rflx Culture, Routine     Genitourinary   BPH with obstruction/lower urinary tract symptoms    Will recheck PSA. Await results. Call with any concerns.       Relevant Orders   CBC with Differential/Platelet   Comprehensive metabolic panel   PSA   UA/M w/rflx Culture, Routine     Other   Schizophrenia ([place])    Follows with psychiatry. Call with any concerns.       Relevant Orders   CBC with Differential/Platelet   Comprehensive metabolic panel   UA/M w/rflx Culture, Routine   Hyperlipidemia    Continue current regimen. Call with any concerns. Refill given today.      Relevant Medications   lisinopril (PRINIVIL,ZESTRIL) 5 MG tablet   rosuvastatin (CRESTOR) 10 MG tablet   Other Relevant Orders   CBC with Differential/Platelet   Comprehensive metabolic panel   Lipid Panel w/o Chol/HDL Ratio   UA/M w/rflx Culture, Routine  Other Visit Diagnoses    Medicare annual wellness visit, subsequent    -  Primary   Preventive care discussed today. Call with any concerns.        Preventative Services:  Health Risk Assessment and Personalized Prevention Plan: Done today Bone Mass Measurements: N/A CVD Screening: Done today Colon Cancer Screening: Up to date Depression Screening: Done today Diabetes Screening: Done today Glaucoma Screening: See your eye doctor Hepatitis B vaccine: N/A Hepatitis C screening: Up to date HIV Screening: Up to date Flu Vaccine: Up to date Lung cancer Screening: N/A Obesity Screening: Done today Pneumonia Vaccines (2): up  to date STI Screening: up to date PSA screening: Done today  Discussed aspirin prophylaxis for myocardial infarction prevention and decision was made to continue ASA  LABORATORY TESTING:  Health maintenance labs ordered today as discussed above.   The natural history of prostate cancer and ongoing controversy regarding screening and potential treatment outcomes of prostate cancer has been discussed with the patient. The meaning of a false positive PSA and a false negative PSA has been discussed. He indicates understanding of the limitations of this screening test and wishes to proceed with screening PSA testing.   IMMUNIZATIONS:   - Tdap: Tetanus vaccination status reviewed: last tetanus booster within 10 years. - Influenza: Up to date - Pneumovax: Up to date - Prevnar: Not applicable - Zostavax vaccine: Up to date  SCREENING: - Colonoscopy: Up to date  Discussed with patient purpose of the colonoscopy is to detect colon cancer at curable precancerous or early stages   PATIENT COUNSELING:    Sexuality: Discussed sexually transmitted diseases, partner selection, use of condoms, avoidance of unintended pregnancy  and contraceptive alternatives.   Advised to avoid cigarette smoking.  I discussed with the patient that most people either abstain from alcohol or drink within safe limits (<=14/week and <=4 drinks/occasion for males, <=7/weeks and <= 3 drinks/occasion for females) and that the risk for alcohol disorders and other health effects rises proportionally with the number of drinks per week and how often a drinker exceeds daily limits.  Discussed cessation/primary prevention of drug use and availability of treatment for abuse.   Diet: Encouraged to adjust caloric intake to maintain  or achieve ideal body weight, to reduce intake of dietary saturated fat and total fat, to limit sodium intake by avoiding high sodium foods and not adding table salt, and to maintain adequate dietary  potassium and calcium preferably from fresh fruits, vegetables, and low-fat dairy products.    stressed the importance of regular exercise  Injury prevention: Discussed safety belts, safety helmets, smoke detector, smoking near bedding or upholstery.   Dental health: Discussed importance of regular tooth brushing, flossing, and dental visits.   Follow up plan: NEXT PREVENTATIVE PHYSICAL DUE IN 1 YEAR. Return in about 6 months (around 07/14/2017) for Follow up.

## 2017-01-12 NOTE — Assessment & Plan Note (Signed)
Will check lab today and adjust as needed. Call with any concerns.

## 2017-01-12 NOTE — Assessment & Plan Note (Signed)
Follows with psychiatry. Call with any concerns.

## 2017-01-12 NOTE — Assessment & Plan Note (Signed)
A1c 5.9. Continue diet and exercise. Continue to monitor.

## 2017-01-13 ENCOUNTER — Encounter: Payer: Self-pay | Admitting: Family Medicine

## 2017-01-13 LAB — COMPREHENSIVE METABOLIC PANEL
ALBUMIN: 4.1 g/dL (ref 3.6–4.8)
ALT: 25 IU/L (ref 0–44)
AST: 23 IU/L (ref 0–40)
Albumin/Globulin Ratio: 1.3 (ref 1.2–2.2)
Alkaline Phosphatase: 44 IU/L (ref 39–117)
BUN / CREAT RATIO: 18 (ref 10–24)
BUN: 21 mg/dL (ref 8–27)
Bilirubin Total: <0.2 mg/dL (ref 0.0–1.2)
CO2: 24 mmol/L (ref 18–29)
CREATININE: 1.19 mg/dL (ref 0.76–1.27)
Calcium: 9.2 mg/dL (ref 8.6–10.2)
Chloride: 101 mmol/L (ref 96–106)
GFR, EST AFRICAN AMERICAN: 75 mL/min/{1.73_m2} (ref >59)
GFR, EST NON AFRICAN AMERICAN: 65 mL/min/{1.73_m2} (ref >59)
GLOBULIN, TOTAL: 3.1 g/dL (ref 1.5–4.5)
GLUCOSE: 145 mg/dL — AB (ref 65–99)
Potassium: 4.3 mmol/L (ref 3.5–5.2)
SODIUM: 141 mmol/L (ref 134–144)
TOTAL PROTEIN: 7.2 g/dL (ref 6.0–8.5)

## 2017-01-13 LAB — CBC WITH DIFFERENTIAL/PLATELET
BASOS ABS: 0 10*3/uL (ref 0.0–0.2)
Basos: 0 %
EOS (ABSOLUTE): 0.1 10*3/uL (ref 0.0–0.4)
Eos: 2 %
Hematocrit: 43.4 % (ref 37.5–51.0)
Hemoglobin: 14.8 g/dL (ref 13.0–17.7)
IMMATURE GRANS (ABS): 0 10*3/uL (ref 0.0–0.1)
Immature Granulocytes: 0 %
LYMPHS ABS: 3.1 10*3/uL (ref 0.7–3.1)
Lymphs: 40 %
MCH: 32 pg (ref 26.6–33.0)
MCHC: 34.1 g/dL (ref 31.5–35.7)
MCV: 94 fL (ref 79–97)
Monocytes Absolute: 0.6 10*3/uL (ref 0.1–0.9)
Monocytes: 8 %
NEUTROS ABS: 3.9 10*3/uL (ref 1.4–7.0)
Neutrophils: 50 %
PLATELETS: 305 10*3/uL (ref 150–379)
RBC: 4.62 x10E6/uL (ref 4.14–5.80)
RDW: 14.5 % (ref 12.3–15.4)
WBC: 7.7 10*3/uL (ref 3.4–10.8)

## 2017-01-13 LAB — LIPID PANEL W/O CHOL/HDL RATIO
Cholesterol, Total: 164 mg/dL (ref 100–199)
HDL: 41 mg/dL (ref >39)
Triglycerides: 417 mg/dL — ABNORMAL HIGH (ref 0–149)

## 2017-01-13 LAB — TSH: TSH: 3.03 u[IU]/mL (ref 0.450–4.500)

## 2017-01-13 LAB — PSA: PROSTATE SPECIFIC AG, SERUM: 0.6 ng/mL (ref 0.0–4.0)

## 2017-02-06 DIAGNOSIS — L570 Actinic keratosis: Secondary | ICD-10-CM | POA: Diagnosis not present

## 2017-02-06 DIAGNOSIS — X32XXXA Exposure to sunlight, initial encounter: Secondary | ICD-10-CM | POA: Diagnosis not present

## 2017-02-06 DIAGNOSIS — L821 Other seborrheic keratosis: Secondary | ICD-10-CM | POA: Diagnosis not present

## 2017-03-09 ENCOUNTER — Other Ambulatory Visit: Payer: Self-pay | Admitting: Family Medicine

## 2017-03-14 ENCOUNTER — Telehealth: Payer: Self-pay

## 2017-03-14 DIAGNOSIS — I10 Essential (primary) hypertension: Secondary | ICD-10-CM

## 2017-03-14 MED ORDER — LEVOTHYROXINE SODIUM 50 MCG PO TABS: 50.0000 ug | ORAL_TABLET | Freq: Every day | ORAL | 4 refills | Status: DC

## 2017-03-14 MED ORDER — LISINOPRIL 5 MG PO TABS: ORAL_TABLET | ORAL | 1 refills | Status: DC

## 2017-03-14 MED ORDER — ROSUVASTATIN CALCIUM 10 MG PO TABS: 10.0000 mg | ORAL_TABLET | Freq: Every day | ORAL | 1 refills | Status: DC

## 2017-03-14 NOTE — Telephone Encounter (Signed)
Requesting 90 day supply for the following  Levothyroxine 68mcg Lisinopril 5mg  Rosuvastatin 10mg 

## 2017-03-20 ENCOUNTER — Other Ambulatory Visit: Payer: Self-pay

## 2017-03-20 NOTE — Telephone Encounter (Signed)
Please advise. Does not appear that RX has been written in our office previously.

## 2017-03-20 NOTE — Telephone Encounter (Signed)
Please have pt schedule appt, RX was denied by provider because he is due an appt w/ Dr. [name]

## 2017-03-27 NOTE — Telephone Encounter (Signed)
Finally got a hold of patient and he schedule a f/u appt on 04/10/17.

## 2017-04-10 ENCOUNTER — Encounter: Payer: Self-pay | Admitting: Family Medicine

## 2017-04-10 ENCOUNTER — Ambulatory Visit (INDEPENDENT_AMBULATORY_CARE_PROVIDER_SITE_OTHER): Payer: Medicare HMO | Admitting: Family Medicine

## 2017-04-10 VITALS — BP 132/73 | HR 72 | Temp 98.4°F | Ht 62.0 in | Wt 169.9 lb

## 2017-04-10 DIAGNOSIS — N522 Drug-induced erectile dysfunction: Secondary | ICD-10-CM | POA: Diagnosis not present

## 2017-04-10 DIAGNOSIS — I10 Essential (primary) hypertension: Secondary | ICD-10-CM | POA: Diagnosis not present

## 2017-04-10 DIAGNOSIS — E782 Mixed hyperlipidemia: Secondary | ICD-10-CM | POA: Diagnosis not present

## 2017-04-10 MED ORDER — ROSUVASTATIN CALCIUM 10 MG PO TABS: 10.0000 mg | ORAL_TABLET | Freq: Every day | ORAL | 1 refills | Status: DC

## 2017-04-10 MED ORDER — LISINOPRIL 5 MG PO TABS: ORAL_TABLET | ORAL | 1 refills | Status: DC

## 2017-04-10 MED ORDER — LEVOTHYROXINE SODIUM 50 MCG PO TABS: 50.0000 ug | ORAL_TABLET | Freq: Every day | ORAL | 4 refills | Status: DC

## 2017-04-10 MED ORDER — SILDENAFIL CITRATE 100 MG PO TABS: 100.0000 mg | ORAL_TABLET | Freq: Every day | ORAL | 6 refills | Status: DC | PRN

## 2017-04-10 MED ORDER — ASPIRIN EC 81 MG PO TBEC
81.0000 mg | DELAYED_RELEASE_TABLET | Freq: Every day | ORAL | 12 refills | Status: DC
Start: 2017-04-10 — End: 2018-01-15

## 2017-04-10 NOTE — Assessment & Plan Note (Signed)
Under good control. Continue current regimen. Call with any concerns.  

## 2017-04-10 NOTE — Progress Notes (Signed)
BP 132/73 (BP Location: Left Arm, Patient Position: Sitting, Cuff Size: Normal)   Pulse 72   Temp 98.4 F (36.9 C)   Ht 5\' 2"  (1.575 m)   Wt 169 lb 14.4 oz (77.1 kg)   SpO2 97%   BMI 31.08 kg/m    Subjective:    Patient ID: Mitchell [name], male    DOB: May 08, 1956, 61 y.o.   MRN: [id number]  HPI: Mitchell [name] is a 61 y.o. male  Chief Complaint  Patient presents with  . Medication Refill    Needs all of his prescriptions sent to [place]. Patient also asked if he could get a refill on Sidenafil.  . Medication Management   HYPERTENSION / HYPERLIPIDEMIA Satisfied with current treatment? yes Duration of hypertension: chronic BP monitoring frequency: not checking BP medication side effects: no Past BP meds: lisinopril Duration of hyperlipidemia: chronic Cholesterol medication side effects: no Cholesterol supplements: none Past cholesterol medications: crestor, atorvastatin (myalgias) Medication compliance: excellent compliance Aspirin: yes Recent stressors: no Recurrent headaches: no Visual changes: no Palpitations: no Dyspnea: no Chest pain: no Lower extremity edema: no Dizzy/lightheaded: no   Would like refill on his sidenifil- has been very expensive for him.  Relevant past medical, surgical, family and social history reviewed and updated as indicated. Interim medical history since our last visit reviewed. Allergies and medications reviewed and updated.  Review of Systems  Constitutional: Negative.   Respiratory: Negative.   Cardiovascular: Negative.   Psychiatric/Behavioral: Negative.     Per HPI unless specifically indicated above     Objective:    BP 132/73 (BP Location: Left Arm, Patient Position: Sitting, Cuff Size: Normal)   Pulse 72   Temp 98.4 F (36.9 C)   Ht 5\' 2"  (1.575 m)   Wt 169 lb 14.4 oz (77.1 kg)   SpO2 97%   BMI 31.08 kg/m   Wt Readings from Last 3 Encounters:  04/10/17 169 lb 14.4 oz (77.1 kg)  01/12/17 164 lb 6.4  oz (74.6 kg)  01/06/17 165 lb (74.8 kg)    Physical Exam  Constitutional: He is oriented to person, place, and time. He appears well-developed and well-nourished. No distress.  HENT:  Head: Normocephalic and atraumatic.  Right Ear: Hearing normal.  Left Ear: Hearing normal.  Nose: Nose normal.  Eyes: Conjunctivae and lids are normal. Right eye exhibits no discharge. Left eye exhibits no discharge. No scleral icterus.  Cardiovascular: Normal rate, regular rhythm, normal heart sounds and intact distal pulses.  Exam reveals no gallop and no friction rub.   No murmur heard. Pulmonary/Chest: Effort normal and breath sounds normal. No respiratory distress. He has no wheezes. He has no rales. He exhibits no tenderness.  Musculoskeletal: Normal range of motion.  Neurological: He is alert and oriented to person, place, and time.  Skin: Skin is warm, dry and intact. No rash noted. He is not diaphoretic. No erythema. No pallor.  Psychiatric: He has a normal mood and affect. His speech is normal and behavior is normal. Judgment and thought content normal. Cognition and memory are normal.  Nursing note and vitals reviewed.   Results for orders placed or performed in visit on 01/12/17  Microscopic Examination  Result Value Ref Range   WBC, UA None seen 0 - 5 /hpf   RBC, UA None seen 0 - 2 /hpf   Epithelial Cells (non renal) CANCELED    Bacteria, UA None seen None seen/Few  CBC with Differential/Platelet  Result Value Ref  Range   WBC 7.7 3.4 - 10.8 x10E3/uL   RBC 4.62 4.14 - 5.80 x10E6/uL   Hemoglobin 14.8 13.0 - 17.7 g/dL   Hematocrit 43.4 37.5 - 51.0 %   MCV 94 79 - 97 fL   MCH 32.0 26.6 - 33.0 pg   MCHC 34.1 31.5 - 35.7 g/dL   RDW 14.5 12.3 - 15.4 %   Platelets 305 150 - 379 x10E3/uL   Neutrophils 50 Not Estab. %   Lymphs 40 Not Estab. %   Monocytes 8 Not Estab. %   Eos 2 Not Estab. %   Basos 0 Not Estab. %   Neutrophils Absolute 3.9 1.4 - 7.0 x10E3/uL   Lymphocytes Absolute 3.1 0.7  - 3.1 x10E3/uL   Monocytes Absolute 0.6 0.1 - 0.9 x10E3/uL   EOS (ABSOLUTE) 0.1 0.0 - 0.4 x10E3/uL   Basophils Absolute 0.0 0.0 - 0.2 x10E3/uL   Immature Granulocytes 0 Not Estab. %   Immature Grans (Abs) 0.0 0.0 - 0.1 x10E3/uL  Comprehensive metabolic panel  Result Value Ref Range   Glucose 145 (H) 65 - 99 mg/dL   BUN 21 8 - 27 mg/dL   Creatinine, Ser 1.19 0.76 - 1.27 mg/dL   GFR calc non Af Amer 65 >59 mL/min/1.73   GFR calc Af Amer 75 >59 mL/min/1.73   BUN/Creatinine Ratio 18 10 - 24   Sodium 141 134 - 144 mmol/L   Potassium 4.3 3.5 - 5.2 mmol/L   Chloride 101 96 - 106 mmol/L   CO2 24 18 - 29 mmol/L   Calcium 9.2 8.6 - 10.2 mg/dL   Total Protein 7.2 6.0 - 8.5 g/dL   Albumin 4.1 3.6 - 4.8 g/dL   Globulin, Total 3.1 1.5 - 4.5 g/dL   Albumin/Globulin Ratio 1.3 1.2 - 2.2   Bilirubin Total <0.2 0.0 - 1.2 mg/dL   Alkaline Phosphatase 44 39 - 117 IU/L   AST 23 0 - 40 IU/L   ALT 25 0 - 44 IU/L  Lipid Panel w/o Chol/HDL Ratio  Result Value Ref Range   Cholesterol, Total 164 100 - 199 mg/dL   Triglycerides 417 (H) 0 - 149 mg/dL   HDL 41 >39 mg/dL   VLDL Cholesterol Cal Comment 5 - 40 mg/dL   LDL Calculated Comment 0 - 99 mg/dL  Microalbumin, Urine Waived  Result Value Ref Range   Microalb, Ur Waived 30 (H) 0 - 19 mg/L   Creatinine, Urine Waived 300 10 - 300 mg/dL   Microalb/Creat Ratio <30 <30 mg/g  TSH  Result Value Ref Range   TSH 3.030 0.450 - 4.500 uIU/mL  PSA  Result Value Ref Range   Prostate Specific Ag, Serum 0.6 0.0 - 4.0 ng/mL  UA/M w/rflx Culture, Routine  Result Value Ref Range   Specific Gravity, UA >1.030 (H) 1.005 - 1.030   pH, UA 6.0 5.0 - 7.5   Color, UA Yellow Yellow   Appearance Ur Clear Clear   Leukocytes, UA Negative Negative   Protein, UA Negative Negative/Trace   Glucose, UA Negative Negative   Ketones, UA Negative Negative   RBC, UA Negative Negative   Bilirubin, UA Negative Negative   Urobilinogen, Ur 0.2 0.2 - 1.0 mg/dL   Nitrite, UA  Negative Negative   Microscopic Examination See below:   Bayer DCA Hb A1c Waived  Result Value Ref Range   Bayer DCA Hb A1c Waived 5.9 <7.0 %      Assessment & Plan:   Problem List Items Addressed  This Visit      Cardiovascular and Mediastinum   Hypertension - Primary    Under good control. Continue current regimen. Call with any concerns.       Relevant Medications   rosuvastatin (CRESTOR) 10 MG tablet   lisinopril (PRINIVIL,ZESTRIL) 5 MG tablet   aspirin EC 81 MG tablet   sildenafil (VIAGRA) 100 MG tablet     Other   Drug-induced erectile dysfunction    Refill of sildenafil sent to [facility], if not covered, will send to [facility] or to [place]. Call with any concerns.       Hyperlipidemia    Under good control. Continue current regimen. Call with any concerns.       Relevant Medications   rosuvastatin (CRESTOR) 10 MG tablet   lisinopril (PRINIVIL,ZESTRIL) 5 MG tablet   aspirin EC 81 MG tablet   sildenafil (VIAGRA) 100 MG tablet       Follow up plan: Return October, for 6 month follow up with labs.

## 2017-04-10 NOTE — Assessment & Plan Note (Signed)
Refill of sildenafil sent to [facility], if not covered, will send to [facility] or to [place]. Call with any concerns.

## 2017-06-12 DIAGNOSIS — F330 Major depressive disorder, recurrent, mild: Secondary | ICD-10-CM | POA: Diagnosis not present

## 2017-06-22 ENCOUNTER — Ambulatory Visit: Payer: Medicare HMO

## 2017-06-27 DIAGNOSIS — Z0100 Encounter for examination of eyes and vision without abnormal findings: Secondary | ICD-10-CM | POA: Diagnosis not present

## 2017-06-27 DIAGNOSIS — H5203 Hypermetropia, bilateral: Secondary | ICD-10-CM | POA: Diagnosis not present

## 2017-07-14 ENCOUNTER — Ambulatory Visit: Payer: Medicare HMO | Admitting: Family Medicine

## 2017-07-17 ENCOUNTER — Ambulatory Visit: Payer: Self-pay

## 2017-07-17 NOTE — Telephone Encounter (Signed)
Discussed pt's concern for elevated BP of 145/90.  Stated he has an appt. with Dr. [name] tomorrow, at 4:00 PM.  Offered to try to move his appt. to an earlier time tomorrow, but pt. declined to move the appt. up, due to another conflict.  Encouraged to try to take it easy and relax this evening, to avoid any additional sodium intake, to recheck his BP after he has rested for 20-30 min.  The pt. questioned if he should take an additional dose of medication this evening?  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 Strongly advised if his symptoms worsen; ie: any signs of weakness of extremities on one side of body, difficulty with balance, difficulty with speech, severe headache, chest pain, shortness of breath, or BP increases above 160/100, to seek medical care at Urgent Care or the ER tonight.  Recommended to avoid any alcoholic beverages this evening.  Advised to call back in the AM, if he would like to move his 4:00 PM appt. to an earlier time tomorrow.  Pt. Verb. Understanding and agrees with plan.        Reason for Disposition . [4] Systolic BP  >= 944 OR Diastolic >= 90 AND [9] taking BP medications  Answer Assessment - Initial Assessment Questions 1. BLOOD PRESSURE: "What is the blood pressure?" "Did you take at least two measurements 5 minutes apart?"     145/90 approx. 45 minutes ago; checked at a pharmacy 2. ONSET: "When did you take your blood pressure?"     See above  3. HOW: "How did you obtain the blood pressure?" (e.g., visiting nurse, automatic home BP monitor)     At an automatic BP monitor at the pharmacy 4. HISTORY: "Do you have a history of high blood pressure?"     yes 5. MEDICATIONS: "Are you taking any medications for blood pressure?" "Have you missed any doses recently?"     Lisinopril 5 mg. - Takes 1.5 tablets daily.  6. OTHER SYMPTOMS: "Do you have any symptoms?" (e.g., headache, chest pain, blurred vision, difficulty breathing, weakness)     Stated his  head hurts when he coughs.  Denied chest pain, blurred vision, shortness of breath, or weakness.  Reported he already has sinus pressure; stated his sinuses are messed up in the Spring and Fall of the year.   7. PREGNANCY: "Is there any chance you are pregnant?" "When was your last menstrual period?"     n/a  Protocols used: HIGH BLOOD PRESSURE-A-AH

## 2017-07-18 ENCOUNTER — Encounter: Payer: Self-pay | Admitting: Family Medicine

## 2017-07-18 ENCOUNTER — Ambulatory Visit (INDEPENDENT_AMBULATORY_CARE_PROVIDER_SITE_OTHER): Payer: Medicare HMO | Admitting: Family Medicine

## 2017-07-18 VITALS — BP 121/75 | HR 72 | Temp 98.9°F | Wt 164.0 lb

## 2017-07-18 DIAGNOSIS — R7301 Impaired fasting glucose: Secondary | ICD-10-CM | POA: Diagnosis not present

## 2017-07-18 DIAGNOSIS — E038 Other specified hypothyroidism: Secondary | ICD-10-CM | POA: Diagnosis not present

## 2017-07-18 DIAGNOSIS — F419 Anxiety disorder, unspecified: Secondary | ICD-10-CM | POA: Diagnosis not present

## 2017-07-18 DIAGNOSIS — Z23 Encounter for immunization: Secondary | ICD-10-CM

## 2017-07-18 DIAGNOSIS — E782 Mixed hyperlipidemia: Secondary | ICD-10-CM

## 2017-07-18 DIAGNOSIS — I10 Essential (primary) hypertension: Secondary | ICD-10-CM

## 2017-07-18 DIAGNOSIS — F331 Major depressive disorder, recurrent, moderate: Secondary | ICD-10-CM | POA: Diagnosis not present

## 2017-07-18 MED ORDER — SILDENAFIL CITRATE 100 MG PO TABS: 100.0000 mg | ORAL_TABLET | Freq: Every day | ORAL | 6 refills | Status: DC | PRN

## 2017-07-18 MED ORDER — LISINOPRIL 5 MG PO TABS: ORAL_TABLET | ORAL | 1 refills | Status: DC

## 2017-07-18 MED ORDER — ROSUVASTATIN CALCIUM 10 MG PO TABS
10.0000 mg | ORAL_TABLET | Freq: Every day | ORAL | 1 refills | Status: DC
Start: 2017-07-18 — End: 2017-08-31

## 2017-07-18 NOTE — Assessment & Plan Note (Signed)
Under good control. Continue current regimen. Call with any concerns.  

## 2017-07-18 NOTE — Progress Notes (Signed)
BP 121/75 (BP Location: Left Arm, Patient Position: Sitting, Cuff Size: Large)   Pulse 72   Temp 98.9 F (37.2 C)   Wt 164 lb (74.4 kg)   SpO2 97%   BMI 30.00 kg/m    Subjective:    Patient ID: Mitchell [name], male    DOB: November 10, 1955, 61 y.o.   MRN: [id number]  HPI: Mitchell [name] is a 61 y.o. male  Chief Complaint  Patient presents with  . Hypertension  . Hyperlipidemia  . Depression   HYPERTENSION / HYPERLIPIDEMIA Satisfied with current treatment? yes Duration of hypertension: chronic BP monitoring frequency: not checking BP medication side effects: no Past BP meds: lisinopril Duration of hyperlipidemia: chronic Cholesterol medication side effects: no Cholesterol supplements: none Past cholesterol medications: crestor Medication compliance: excellent compliance Aspirin: yes Recent stressors: no Recurrent headaches: no Visual changes: no Palpitations: no Dyspnea: no Chest pain: no Lower extremity edema: no Dizzy/lightheaded: no  Impaired Fasting Glucose Duration of elevated blood sugar: chronic Polydipsia: no Polyuria: no Weight change: no Visual disturbance: no Glucose Monitoring: no Family history of diabetes: no  DEPRESSION Mood status: controlled Satisfied with current treatment?: yes Symptom severity: mild  Duration of current treatment : off medicine Depressed mood: no Anxious mood: no Anhedonia: no Significant weight loss or gain: no Insomnia: no  Fatigue: no Feelings of worthlessness or guilt: no Impaired concentration/indecisiveness: no Suicidal ideations: no Hopelessness: no Crying spells: no Depression screen [facility] 2/9 07/18/2017 01/12/2017 07/16/2015  Decreased Interest 0 0 0  Down, Depressed, Hopeless 0 0 0  PHQ - 2 Score 0 0 0    Relevant past medical, surgical, family and social history reviewed and updated as indicated. Interim medical history since our last visit reviewed. Allergies and medications reviewed and  updated.  Review of Systems  Constitutional: Negative.   Respiratory: Negative.   Cardiovascular: Negative.   Psychiatric/Behavioral: Negative.     Per HPI unless specifically indicated above     Objective:    BP 121/75 (BP Location: Left Arm, Patient Position: Sitting, Cuff Size: Large)   Pulse 72   Temp 98.9 F (37.2 C)   Wt 164 lb (74.4 kg)   SpO2 97%   BMI 30.00 kg/m   Wt Readings from Last 3 Encounters:  07/18/17 164 lb (74.4 kg)  04/10/17 169 lb 14.4 oz (77.1 kg)  01/12/17 164 lb 6.4 oz (74.6 kg)    Physical Exam  Constitutional: He is oriented to person, place, and time. He appears well-developed and well-nourished. No distress.  HENT:  Head: Normocephalic and atraumatic.  Right Ear: Hearing normal.  Left Ear: Hearing normal.  Nose: Nose normal.  Eyes: Conjunctivae and lids are normal. Right eye exhibits no discharge. Left eye exhibits no discharge. No scleral icterus.  Cardiovascular: Normal rate, regular rhythm, normal heart sounds and intact distal pulses.  Exam reveals no gallop and no friction rub.   No murmur heard. Pulmonary/Chest: Effort normal and breath sounds normal. No respiratory distress. He has no wheezes. He has no rales. He exhibits no tenderness.  Musculoskeletal: Normal range of motion.  Neurological: He is alert and oriented to person, place, and time.  Skin: Skin is warm, dry and intact. No rash noted. He is not diaphoretic. No erythema. No pallor.  Psychiatric: He has a normal mood and affect. His speech is normal and behavior is normal. Judgment and thought content normal. Cognition and memory are normal.  Nursing note and vitals reviewed.   Results for orders placed  or performed in visit on 01/12/17  Microscopic Examination  Result Value Ref Range   WBC, UA None seen 0 - 5 /hpf   RBC, UA None seen 0 - 2 /hpf   Epithelial Cells (non renal) CANCELED    Bacteria, UA None seen None seen/Few  CBC with Differential/Platelet  Result Value  Ref Range   WBC 7.7 3.4 - 10.8 x10E3/uL   RBC 4.62 4.14 - 5.80 x10E6/uL   Hemoglobin 14.8 13.0 - 17.7 g/dL   Hematocrit 43.4 37.5 - 51.0 %   MCV 94 79 - 97 fL   MCH 32.0 26.6 - 33.0 pg   MCHC 34.1 31.5 - 35.7 g/dL   RDW 14.5 12.3 - 15.4 %   Platelets 305 150 - 379 x10E3/uL   Neutrophils 50 Not Estab. %   Lymphs 40 Not Estab. %   Monocytes 8 Not Estab. %   Eos 2 Not Estab. %   Basos 0 Not Estab. %   Neutrophils Absolute 3.9 1.4 - 7.0 x10E3/uL   Lymphocytes Absolute 3.1 0.7 - 3.1 x10E3/uL   Monocytes Absolute 0.6 0.1 - 0.9 x10E3/uL   EOS (ABSOLUTE) 0.1 0.0 - 0.4 x10E3/uL   Basophils Absolute 0.0 0.0 - 0.2 x10E3/uL   Immature Granulocytes 0 Not Estab. %   Immature Grans (Abs) 0.0 0.0 - 0.1 x10E3/uL  Comprehensive metabolic panel  Result Value Ref Range   Glucose 145 (H) 65 - 99 mg/dL   BUN 21 8 - 27 mg/dL   Creatinine, Ser 1.19 0.76 - 1.27 mg/dL   GFR calc non Af Amer 65 >59 mL/min/1.73   GFR calc Af Amer 75 >59 mL/min/1.73   BUN/Creatinine Ratio 18 10 - 24   Sodium 141 134 - 144 mmol/L   Potassium 4.3 3.5 - 5.2 mmol/L   Chloride 101 96 - 106 mmol/L   CO2 24 18 - 29 mmol/L   Calcium 9.2 8.6 - 10.2 mg/dL   Total Protein 7.2 6.0 - 8.5 g/dL   Albumin 4.1 3.6 - 4.8 g/dL   Globulin, Total 3.1 1.5 - 4.5 g/dL   Albumin/Globulin Ratio 1.3 1.2 - 2.2   Bilirubin Total <0.2 0.0 - 1.2 mg/dL   Alkaline Phosphatase 44 39 - 117 IU/L   AST 23 0 - 40 IU/L   ALT 25 0 - 44 IU/L  Lipid Panel w/o Chol/HDL Ratio  Result Value Ref Range   Cholesterol, Total 164 100 - 199 mg/dL   Triglycerides 417 (H) 0 - 149 mg/dL   HDL 41 >39 mg/dL   VLDL Cholesterol Cal Comment 5 - 40 mg/dL   LDL Calculated Comment 0 - 99 mg/dL  Microalbumin, Urine Waived  Result Value Ref Range   Microalb, Ur Waived 30 (H) 0 - 19 mg/L   Creatinine, Urine Waived 300 10 - 300 mg/dL   Microalb/Creat Ratio <30 <30 mg/g  TSH  Result Value Ref Range   TSH 3.030 0.450 - 4.500 uIU/mL  PSA  Result Value Ref Range    Prostate Specific Ag, Serum 0.6 0.0 - 4.0 ng/mL  UA/M w/rflx Culture, Routine  Result Value Ref Range   Specific Gravity, UA >1.030 (H) 1.005 - 1.030   pH, UA 6.0 5.0 - 7.5   Color, UA Yellow Yellow   Appearance Ur Clear Clear   Leukocytes, UA Negative Negative   Protein, UA Negative Negative/Trace   Glucose, UA Negative Negative   Ketones, UA Negative Negative   RBC, UA Negative Negative   Bilirubin, UA Negative  Negative   Urobilinogen, Ur 0.2 0.2 - 1.0 mg/dL   Nitrite, UA Negative Negative   Microscopic Examination See below:   Bayer DCA Hb A1c Waived  Result Value Ref Range   Bayer DCA Hb A1c Waived 5.9 <7.0 %      Assessment & Plan:   Problem List Items Addressed This Visit      Cardiovascular and Mediastinum   Hypertension - Primary    Under good control. Continue current regimen. Call with any concerns.       Relevant Medications   lisinopril (PRINIVIL,ZESTRIL) 5 MG tablet   rosuvastatin (CRESTOR) 10 MG tablet   sildenafil (VIAGRA) 100 MG tablet   Other Relevant Orders   Comprehensive metabolic panel     Endocrine   IFG (impaired fasting glucose)    Under good control. Continue current regimen. Call with any concerns.       Relevant Orders   Bayer DCA Hb A1c Waived   Comprehensive metabolic panel   Hypothyroidism    Under good control. Continue current regimen. Call with any concerns.         Other   Anxiety    Under good control. Continue current regimen. Call with any concerns.       Depression    Under good control. Continue current regimen. Call with any concerns.       Relevant Orders   Comprehensive metabolic panel   Hyperlipidemia    Under good control. Continue current regimen. Call with any concerns.       Relevant Medications   lisinopril (PRINIVIL,ZESTRIL) 5 MG tablet   rosuvastatin (CRESTOR) 10 MG tablet   sildenafil (VIAGRA) 100 MG tablet   Other Relevant Orders   Comprehensive metabolic panel   Lipid Panel w/o Chol/HDL Ratio     Other Visit Diagnoses    Immunization due       Flu shot given today.   Relevant Orders   Flu Vaccine QUAD 6+ mos PF IM (Fluarix Quad PF) (Completed)       Follow up plan: Return in about 6 months (around 01/16/2018) for Physical.

## 2017-07-19 ENCOUNTER — Encounter: Payer: Self-pay | Admitting: Family Medicine

## 2017-07-19 LAB — COMPREHENSIVE METABOLIC PANEL
ALK PHOS: 51 IU/L (ref 39–117)
ALT: 26 IU/L (ref 0–44)
AST: 23 IU/L (ref 0–40)
Albumin/Globulin Ratio: 1.4 (ref 1.2–2.2)
Albumin: 4.1 g/dL (ref 3.6–4.8)
BILIRUBIN TOTAL: 0.2 mg/dL (ref 0.0–1.2)
BUN/Creatinine Ratio: 15 (ref 10–24)
BUN: 17 mg/dL (ref 8–27)
CHLORIDE: 103 mmol/L (ref 96–106)
CO2: 23 mmol/L (ref 20–29)
CREATININE: 1.15 mg/dL (ref 0.76–1.27)
Calcium: 9 mg/dL (ref 8.6–10.2)
GFR calc Af Amer: 78 mL/min/{1.73_m2} (ref >59)
GFR calc non Af Amer: 68 mL/min/{1.73_m2} (ref >59)
GLOBULIN, TOTAL: 2.9 g/dL (ref 1.5–4.5)
GLUCOSE: 105 mg/dL — AB (ref 65–99)
Potassium: 4.4 mmol/L (ref 3.5–5.2)
SODIUM: 140 mmol/L (ref 134–144)
Total Protein: 7 g/dL (ref 6.0–8.5)

## 2017-07-19 LAB — LIPID PANEL W/O CHOL/HDL RATIO
CHOLESTEROL TOTAL: 142 mg/dL (ref 100–199)
HDL: 34 mg/dL — ABNORMAL LOW (ref >39)
TRIGLYCERIDES: 425 mg/dL — AB (ref 0–149)

## 2017-07-19 LAB — BAYER DCA HB A1C WAIVED: HB A1C (BAYER DCA - WAIVED): 5.7 % (ref <7.0)

## 2017-07-24 IMAGING — CR DG ABDOMEN 2V
1 series · 3 of 3 positions shown · non-contrast
Comparison: None.

CLINICAL DATA: Left lower quadrant abdominal pain for couple of
days.

EXAM:
ABDOMEN - 2 VIEW

[Series 1: dg abd 2 views · 0.14mm/px · 3 of 3 slices shown]
[im 1/3]
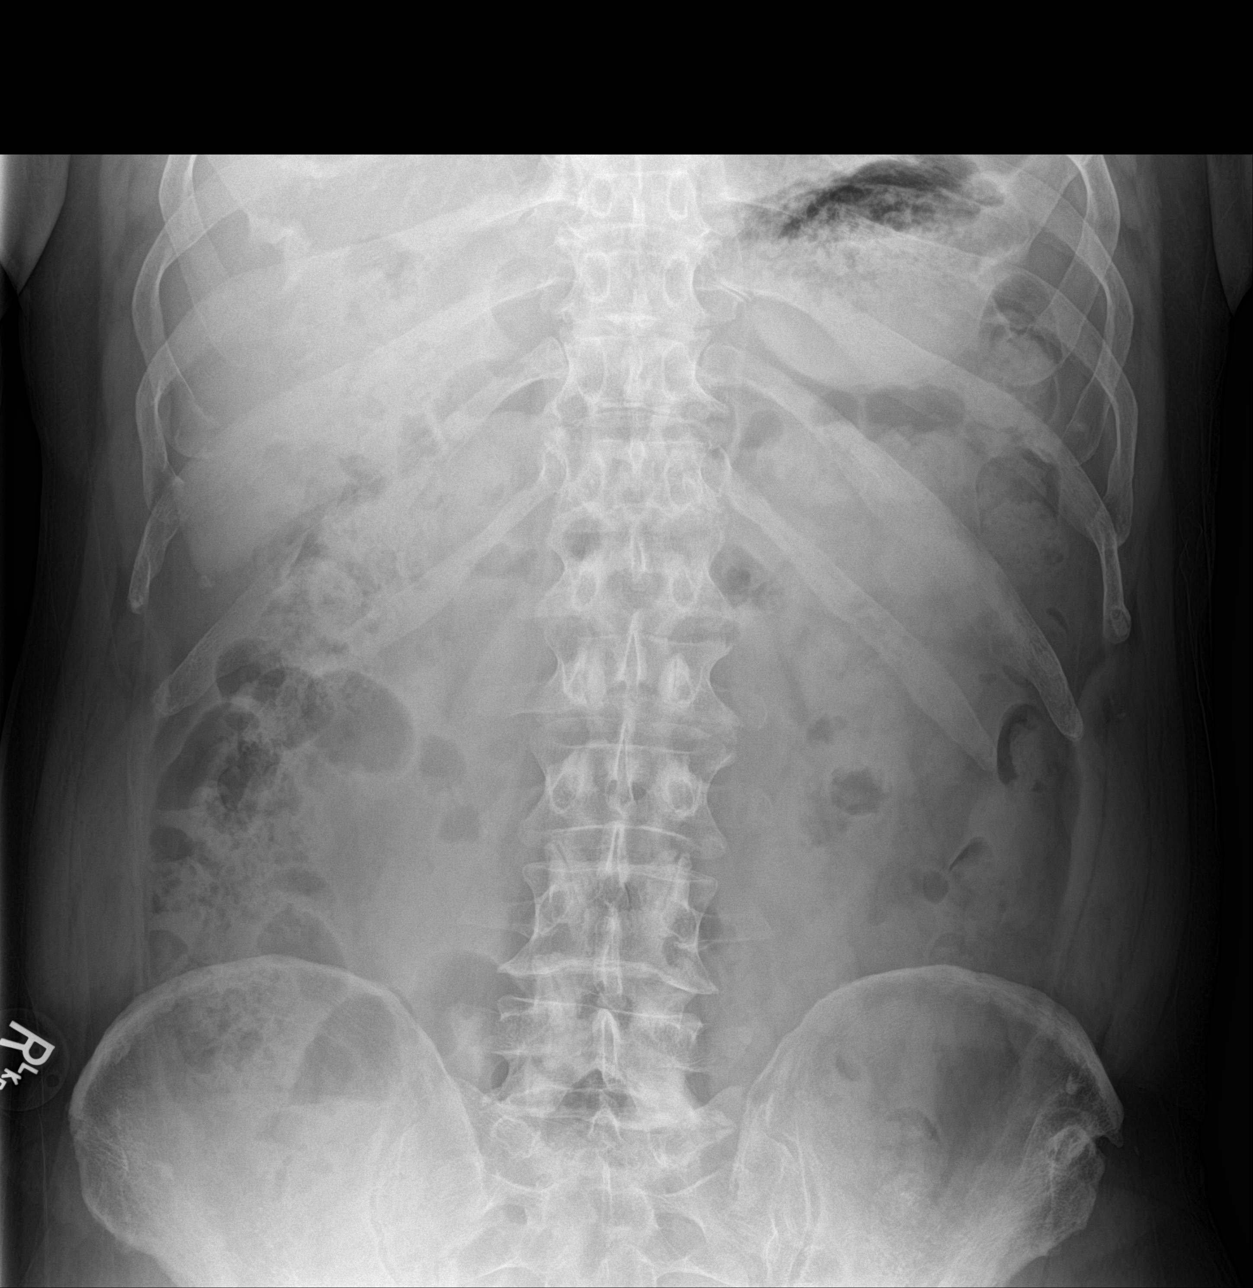
[im 2/3]
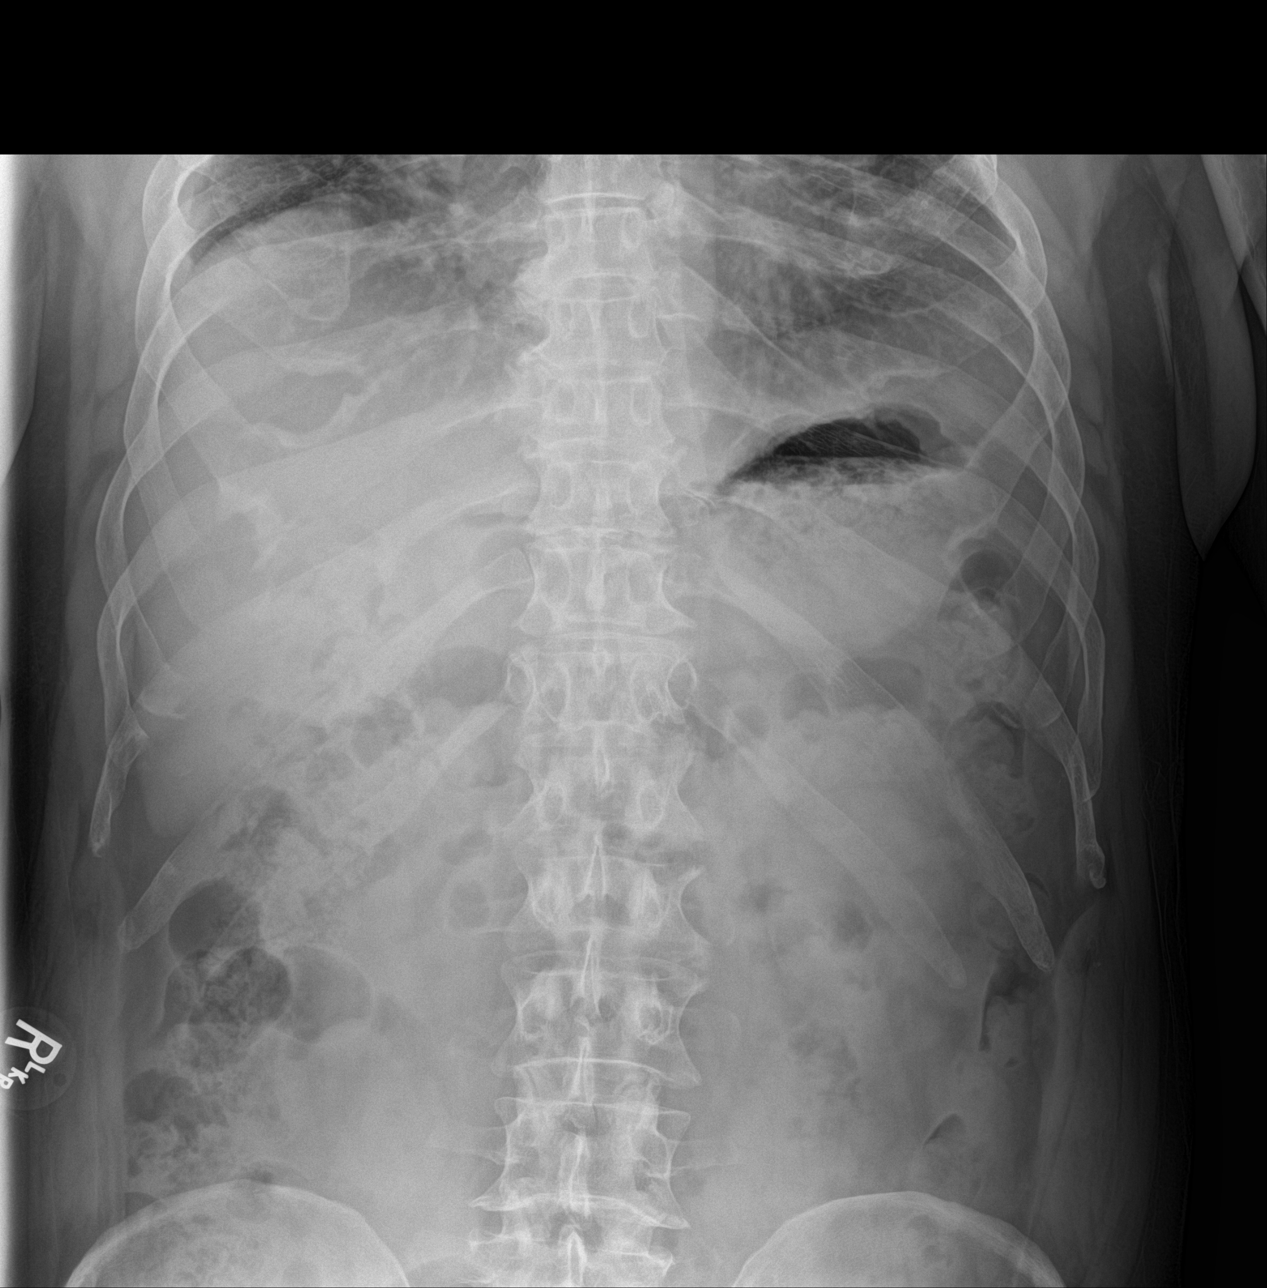
[im 3/3]
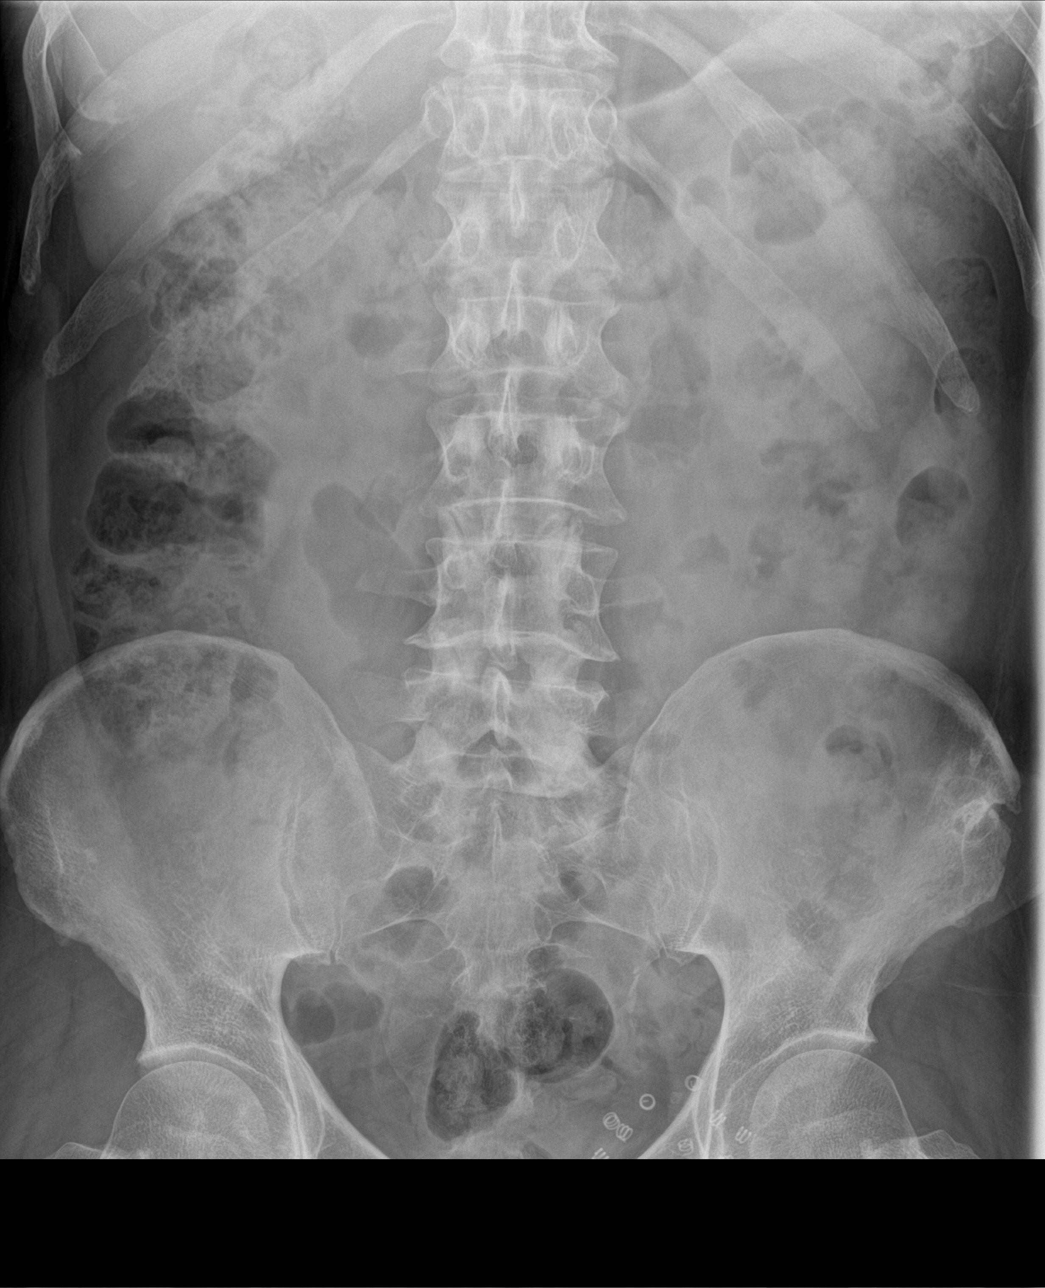

[3 of 3 positions shown; findings below may reference images not displayed]

FINDINGS: Stool is seen throughout the colon. No unexpected radiopaque
calculi. Hernia repair is seen in the left inguinal region. Lung
bases are grossly clear.
IMPRESSION: Stool throughout the colon is indicative of constipation.

## 2017-08-24 ENCOUNTER — Telehealth: Payer: Self-pay | Admitting: Family Medicine

## 2017-08-24 NOTE — Telephone Encounter (Signed)
Copied from [place]. Topic: General - Other >> Aug 24, 2017  1:46 PM Hewitt Shorts wrote: Reason for CRM: pt is calling to request that we fax or call into to [place] to be able to get meds thru mail order the medication is blood pressure , thyroid, and cholesteral -he stated we have everything on file   Best number to patient is [phone]  Fax number to insurance [phone] Phone number for pharmacy  [phone]

## 2017-08-31 ENCOUNTER — Telehealth: Payer: Self-pay

## 2017-08-31 ENCOUNTER — Telehealth: Payer: Self-pay | Admitting: Family Medicine

## 2017-08-31 DIAGNOSIS — I10 Essential (primary) hypertension: Secondary | ICD-10-CM

## 2017-08-31 MED ORDER — ROSUVASTATIN CALCIUM 10 MG PO TABS: 10.0000 mg | ORAL_TABLET | Freq: Every day | ORAL | 1 refills | Status: DC

## 2017-08-31 MED ORDER — LEVOTHYROXINE SODIUM 50 MCG PO TABS: 50.0000 ug | ORAL_TABLET | Freq: Every day | ORAL | 4 refills | Status: DC

## 2017-08-31 MED ORDER — LISINOPRIL 5 MG PO TABS: ORAL_TABLET | ORAL | 1 refills | Status: DC

## 2017-08-31 NOTE — Telephone Encounter (Signed)
This does not make any sense.

## 2017-08-31 NOTE — Telephone Encounter (Signed)
Spoke with pharmacy, they have the prescriptions and they are in the process of filling them

## 2017-08-31 NOTE — Telephone Encounter (Signed)
Copied from [place]. Topic: Quick Communication - See Telephone Encounter >> Aug 31, 2017  2:06 PM Hewitt Shorts wrote: CRM for notification. See Telephone encounter for: [facility] is calling asking how do they handle the refill on the synthroid, crestor and lisinopril they can only fax not do e-scribe   Best number [phone]  08/31/17.

## 2017-08-31 NOTE — Telephone Encounter (Signed)
prescription  Fax   question

## 2017-08-31 NOTE — Telephone Encounter (Signed)
Rosuvastatin Lisinopril Levothyroxine

## 2017-09-20 ENCOUNTER — Telehealth: Payer: Self-pay | Admitting: Family Medicine

## 2017-09-20 NOTE — Telephone Encounter (Signed)
Called patient to advise that 0.05 mg is equal to 50 mcg.   Mg stands for milligrams, while mcg stands for micrograms. Pt answered phone then acted as if he could not hear me. Pt disconnected line. Attempted to reach pt again w/o answer.

## 2017-09-20 NOTE — Telephone Encounter (Signed)
Copied from [place] [id number]. Topic: Quick Communication - Rx Refill/Question >> Sep 20, 2017  1:54 PM Arletha Grippe wrote: Has the patient contacted their pharmacy? Yes.     (Agent: If no, request that the patient contact the pharmacy for the refill.)   Preferred Pharmacy (with phone number or street name): pt takes levothyroxine (SYNTHROID, LEVOTHROID) 50 MCG tablet. He got new rx from pharm and it reads 0.05mg . Pt states that this is a different dose and would like call back with explanation.  Cb number is [phone]    Agent: Please be advised that RX refills may take up to 3 business days. We ask that you follow-up with your pharmacy.

## 2017-09-21 NOTE — Telephone Encounter (Signed)
Patient notified

## 2017-09-26 DIAGNOSIS — H348322 Tributary (branch) retinal vein occlusion, left eye, stable: Secondary | ICD-10-CM | POA: Diagnosis not present

## 2017-12-26 ENCOUNTER — Encounter: Payer: Self-pay | Admitting: Family Medicine

## 2018-01-05 ENCOUNTER — Ambulatory Visit: Payer: Self-pay | Admitting: Family Medicine

## 2018-01-15 ENCOUNTER — Ambulatory Visit (INDEPENDENT_AMBULATORY_CARE_PROVIDER_SITE_OTHER): Payer: Self-pay

## 2018-01-15 ENCOUNTER — Encounter: Payer: Self-pay | Admitting: Family Medicine

## 2018-01-15 ENCOUNTER — Ambulatory Visit (INDEPENDENT_AMBULATORY_CARE_PROVIDER_SITE_OTHER): Payer: Medicare Other | Admitting: Family Medicine

## 2018-01-15 VITALS — BP 122/80 | HR 96 | Temp 98.1°F | Ht 62.0 in | Wt 168.3 lb

## 2018-01-15 VITALS — BP 122/80 | HR 87 | Temp 98.1°F | Resp 16 | Ht 62.0 in | Wt 168.5 lb

## 2018-01-15 DIAGNOSIS — R7301 Impaired fasting glucose: Secondary | ICD-10-CM | POA: Diagnosis not present

## 2018-01-15 DIAGNOSIS — F419 Anxiety disorder, unspecified: Secondary | ICD-10-CM

## 2018-01-15 DIAGNOSIS — F209 Schizophrenia, unspecified: Secondary | ICD-10-CM

## 2018-01-15 DIAGNOSIS — E038 Other specified hypothyroidism: Secondary | ICD-10-CM

## 2018-01-15 DIAGNOSIS — N138 Other obstructive and reflux uropathy: Secondary | ICD-10-CM

## 2018-01-15 DIAGNOSIS — F331 Major depressive disorder, recurrent, moderate: Secondary | ICD-10-CM

## 2018-01-15 DIAGNOSIS — E782 Mixed hyperlipidemia: Secondary | ICD-10-CM

## 2018-01-15 DIAGNOSIS — N401 Enlarged prostate with lower urinary tract symptoms: Secondary | ICD-10-CM | POA: Diagnosis not present

## 2018-01-15 DIAGNOSIS — I10 Essential (primary) hypertension: Secondary | ICD-10-CM | POA: Diagnosis not present

## 2018-01-15 DIAGNOSIS — Z0000 Encounter for general adult medical examination without abnormal findings: Secondary | ICD-10-CM

## 2018-01-15 LAB — UA/M W/RFLX CULTURE, ROUTINE
Bilirubin, UA: NEGATIVE
GLUCOSE, UA: NEGATIVE
KETONES UA: NEGATIVE
LEUKOCYTES UA: NEGATIVE
NITRITE UA: NEGATIVE
Protein, UA: NEGATIVE
RBC UA: NEGATIVE
Specific Gravity, UA: 1.02 (ref 1.005–1.030)
Urobilinogen, Ur: 0.2 mg/dL (ref 0.2–1.0)
pH, UA: 6 (ref 5.0–7.5)

## 2018-01-15 LAB — BAYER DCA HB A1C WAIVED: HB A1C (BAYER DCA - WAIVED): 5.6 % (ref <7.0)

## 2018-01-15 MED ORDER — ASPIRIN EC 81 MG PO TBEC: 81.0000 mg | DELAYED_RELEASE_TABLET | Freq: Every day | ORAL | 12 refills | Status: DC

## 2018-01-15 MED ORDER — ROSUVASTATIN CALCIUM 10 MG PO TABS: 10.0000 mg | ORAL_TABLET | Freq: Every day | ORAL | 1 refills | Status: DC

## 2018-01-15 MED ORDER — LISINOPRIL 5 MG PO TABS: ORAL_TABLET | ORAL | 1 refills | Status: DC

## 2018-01-15 MED ORDER — SILDENAFIL CITRATE 100 MG PO TABS: 100.0000 mg | ORAL_TABLET | Freq: Every day | ORAL | 6 refills | Status: DC | PRN

## 2018-01-15 NOTE — Progress Notes (Signed)
Subjective:   Mitchell [name] is a 62 y.o. male who presents for Medicare Annual/Subsequent preventive examination.  Review of Systems:   Cardiac Risk Factors include: hypertension;advanced age (>85men, >63 women);dyslipidemia;smoking/ tobacco exposure     Objective:    Vitals: BP 122/80 (BP Location: Left Arm, Patient Position: Sitting)   Pulse 87   Temp 98.1 F (36.7 C) (Temporal)   Resp 16   Ht 5\' 2"  (1.575 m)   Wt 168 lb 8 oz (76.4 kg)   SpO2 96%   BMI 30.82 kg/m   Body mass index is 30.82 kg/m.  Advanced Directives 01/12/2017 01/06/2017 07/21/2016 06/07/2016 01/01/2016 12/11/2015 12/09/2015  Does Patient Have a Medical Advance Directive? No No No No No No No  Would patient like information on creating a medical advance directive? Yes (MAU/Ambulatory/Procedural Areas - Information given) No - Patient declined - No - patient declined information No - patient declined information No - patient declined information No - patient declined information    Tobacco Social History   Tobacco Use  Smoking Status Former Smoker  . Last attempt to quit: 07/06/1995  . Years since quitting: 22.5  Smokeless Tobacco Former Engineer, structural given: Not Answered   Clinical Intake:  Pre-visit preparation completed: Yes  Pain : No/denies pain     Nutritional Status: BMI > 30  Obese Nutritional Risks: None Diabetes: No  How often do you need to have someone help you when you read instructions, pamphlets, or other written materials from your doctor or pharmacy?: 1 - Never What is the last grade level you completed in school?: 8th grade  Interpreter Needed?: No  Information entered by :: Mitchell Leidner,LPN   Past Medical History:  Diagnosis Date  . Anxiety   . Cancer ([place])    skin cancer  . Depression   . Epididymitis   . Hepatitis C    Tested positive, treated now told that he does not have it  . Hydrocele   . Hypertension   . IFG (impaired fasting glucose)   . Insomnia     . Kidney stone   . Schizophrenia [facility])    Past Surgical History:  Procedure Laterality Date  . ANKLE FRACTURE SURGERY    . COLONOSCOPY  2010  . COLONOSCOPY WITH PROPOFOL  06/07/2016   Procedure: COLONOSCOPY WITH PROPOFOL;  Surgeon: [name], MD;  Location: [facility] ENDOSCOPY;  Service: Endoscopy;;  . ESOPHAGOGASTRODUODENOSCOPY (EGD) WITH PROPOFOL N/A 06/07/2016   Procedure: ESOPHAGOGASTRODUODENOSCOPY (EGD) WITH PROPOFOL;  Surgeon: [name], MD;  Location: [facility] ENDOSCOPY;  Service: Endoscopy;  Laterality: N/A;  . HERNIA REPAIR  2011   X 2   Family History  Problem Relation Age of Onset  . Diabetes Mother   . Alzheimer's disease Father   . Diabetes Brother   . Diabetes Maternal Grandfather    Social History   Socioeconomic History  . Marital status: Married    Spouse name: Not on file  . Number of children: Not on file  . Years of education: Not on file  . Highest education level: Not on file  Occupational History  . Not on file  Social Needs  . Financial resource strain: Not hard at all  . Food insecurity:    Worry: Never true    Inability: Never true  . Transportation needs:    Medical: No    Non-medical: No  Tobacco Use  . Smoking status: Former Smoker    Last attempt to quit: 07/06/1995  Years since quitting: 22.5  . Smokeless tobacco: Former Network engineer and Sexual Activity  . Alcohol use: Yes    Alcohol/week: 0.6 oz    Types: 1 Cans of beer per week    Comment: remote history, quit 20+ years ago (1 beer q 2-3 days)  . Drug use: No    Comment: remote history, quit 20= years ago  . Sexual activity: Yes  Lifestyle  . Physical activity:    Days per week: 0 days    Minutes per session: 0 min  . Stress: Not at all  Relationships  . Social connections:    Talks on phone: More than three times a week    Gets together: More than three times a week    Attends religious service: More than 4 times per year    Active member of club or organization: No     Attends meetings of clubs or organizations: Never    Relationship status: Married  Other Topics Concern  . Not on file  Social History Narrative  . Not on file    Outpatient Encounter Medications as of 01/15/2018  Medication Sig  . aspirin EC 81 MG tablet Take 1 tablet (81 mg total) by mouth daily.  . clonazePAM (KLONOPIN) 0.5 MG tablet Take 0.5 mg by mouth 3 (three) times daily as needed for anxiety.  Marland Kitchen FLOVENT HFA 220 MCG/ACT inhaler   . levothyroxine (SYNTHROID, LEVOTHROID) 50 MCG tablet Take 1 tablet (50 mcg total) by mouth daily before breakfast.  . lisinopril (PRINIVIL,ZESTRIL) 5 MG tablet TAKE 1 & 1/2 TABLETS (7.5 MG) BY MOUTH ONCE DAILY  . loratadine (CLARITIN) 10 MG tablet Take 10 mg by mouth daily.  . Multiple Vitamin (MULTI-VITAMINS) TABS Take by mouth.  . rosuvastatin (CRESTOR) 10 MG tablet Take 1 tablet (10 mg total) by mouth daily.  . sildenafil (VIAGRA) 100 MG tablet Take 1 tablet (100 mg total) by mouth daily as needed for erectile dysfunction.  . [DISCONTINUED] cetirizine (ZYRTEC) 5 MG tablet Take 5 mg by mouth daily. Reported on 04/01/2016   No facility-administered encounter medications on file as of 01/15/2018.     Activities of Daily Living In your present state of health, do you have any difficulty performing the following activities: 01/15/2018  Hearing? N  Vision? N  Difficulty concentrating or making decisions? N  Walking or climbing stairs? N  Dressing or bathing? N  Doing errands, shopping? N  Preparing Food and eating ? N  Using the Toilet? N  In the past six months, have you accidently leaked urine? N  Do you have problems with loss of bowel control? N  Managing your Medications? N  Managing your Finances? N  Housekeeping or managing your Housekeeping? N  Some recent data might be hidden    Patient Care Team: Valerie Roys, DO as PCP - General (Family Medicine)   Assessment:   This is a routine wellness examination for [name].  Exercise  Activities and Dietary recommendations Current Exercise Habits: The patient does not participate in regular exercise at present, Exercise limited by: None identified  Goals    . DIET - INCREASE WATER INTAKE     Recommend drinking at least 6-8 glasses of water a day        Fall Risk Fall Risk  01/15/2018 07/18/2017 01/12/2017 10/13/2016 07/16/2015  Falls in the past year? No No No No No   Is the patient's home free of loose throw rugs in walkways, pet beds, electrical  cords, etc?   yes      Grab bars in the bathroom? yes      Handrails on the stairs?   yes      Adequate lighting?   yes  Timed Get Up and Go Performed: Completed in 8 seconds with no use of assistive devices, steady gait. No intervention needed at this time.   Depression Screen PHQ 2/9 Scores 01/15/2018 07/18/2017 01/12/2017 07/16/2015  PHQ - 2 Score 0 0 0 0    Cognitive Function     6CIT Screen 01/12/2017  What Year? 0 points  What month? 0 points  What time? 0 points  Count back from 20 0 points  Months in reverse 0 points  Repeat phrase 4 points  Total Score 4    Immunization History  Administered Date(s) Administered  . Influenza,inj,Quad PF,6+ Mos 06/08/2015, 06/27/2016, 07/18/2017  . Pneumococcal Polysaccharide-23 05/17/2013  . Zoster 06/24/2015    Qualifies for Shingles Vaccine? Yes, discussed shingrix vaccine   Screening Tests Health Maintenance  Topic Date Due  . TETANUS/TDAP  09/19/2017  . INFLUENZA VACCINE  04/19/2018  . COLONOSCOPY  06/07/2026  . Hepatitis C Screening  Completed  . HIV Screening  Addressed   Cancer Screenings: Lung: Low Dose CT Chest recommended if Age 52-80 years, 30 pack-year currently smoking OR have quit w/in 15years. Patient does not qualify. Colorectal: completed 06/07/2016  Additional Screenings:  Hepatitis C Screening: completed 01/12/2017      Plan:    I have personally reviewed and addressed the Medicare Annual Wellness questionnaire and have noted the  following in the patient's chart:  A. Medical and social history B. Use of alcohol, tobacco or illicit drugs  C. Current medications and supplements D. Functional ability and status E.  Nutritional status F.  Physical activity G. Advance directives H. List of other physicians I.  Hospitalizations, surgeries, and ER visits in previous 12 months J.  [place] such as hearing and vision if needed, cognitive and depression L. Referrals and appointments   In addition, I have reviewed and discussed with patient certain preventive protocols, quality metrics, and best practice recommendations. A written personalized care plan for preventive services as well as general preventive health recommendations were provided to patient.   Signed,  Tyler Aas, LPN Nurse Health Advisor   Nurse Notes:none

## 2018-01-15 NOTE — Assessment & Plan Note (Signed)
Under good control. Continue current regimen. Continue to monitor. Call with any concerns. Continue to follow with psychiatry.

## 2018-01-15 NOTE — Assessment & Plan Note (Signed)
Under good control. Continue current regimen. Continue to monitor. Call with any concerns. Refills given.  

## 2018-01-15 NOTE — Assessment & Plan Note (Signed)
Under good control with A1c of 5.6. Continue current regimen. Continue to monitor. Call with any concerns.  

## 2018-01-15 NOTE — Assessment & Plan Note (Signed)
Under good control. Continue current regimen. Continue to monitor. Call with any concerns. Refills to be given pending labs.

## 2018-01-15 NOTE — Progress Notes (Signed)
BP 122/80 (BP Location: Left Arm, Patient Position: Sitting, Cuff Size: Large)   Pulse 96   Temp 98.1 F (36.7 C)   Ht 5\' 2"  (1.575 m)   Wt 168 lb 5 oz (76.3 kg)   SpO2 96%   BMI 30.78 kg/m    Subjective:    Patient ID: Mitchell [name], male    DOB: 1956/06/30, 62 y.o.   MRN: [id number]  HPI: Mitchell [name] is a 62 y.o. male  Chief Complaint  Patient presents with  . Hypertension  . Hyperlipidemia  . Hypothyroidism   Impaired Fasting Glucose Duration of elevated blood sugar: chronic Polydipsia: no Polyuria: no Weight change: no Visual disturbance: no Glucose Monitoring: no Family history of diabetes: no  HYPERTENSION / HYPERLIPIDEMIA Satisfied with current treatment? yes Duration of hypertension: chronic BP monitoring frequency: not checking BP range:  BP medication side effects: no Past BP meds: lisinopril Duration of hyperlipidemia: chronic Cholesterol medication side effects: no Cholesterol supplements: none Past cholesterol medications: crestor Medication compliance: excellent compliance Aspirin: yes Recent stressors: no Recurrent headaches: no Visual changes: no Palpitations: no Dyspnea: no Chest pain: no Lower extremity edema: no Dizzy/lightheaded: no  DEPRESSION/ANXIETY Mood status: controlled Satisfied with current treatment?: no Symptom severity: mild  Duration of current treatment : chronic Side effects: no Medication compliance: excellent compliance Psychotherapy/counseling: no  Depressed mood: no Anxious mood: no Anhedonia: no Significant weight loss or gain: no Insomnia: no  Fatigue: no Feelings of worthlessness or guilt: no Impaired concentration/indecisiveness: no Suicidal ideations: no Hopelessness: no Crying spells: no Depression screen [facility] 2/9 01/15/2018 01/15/2018 07/18/2017 01/12/2017 07/16/2015  Decreased Interest 0 0 0 0 0  Down, Depressed, Hopeless 0 0 0 0 0  PHQ - 2 Score 0 0 0 0 0  Altered sleeping 0 - - - -  Tired,  decreased energy 0 - - - -  Change in appetite 0 - - - -  Feeling bad or failure about yourself  0 - - - -  Trouble concentrating 0 - - - -  Moving slowly or fidgety/restless 0 - - - -  Suicidal thoughts 0 - - - -  PHQ-9 Score 0 - - - -  Difficult doing work/chores Not difficult at all - - - -   HYPOTHYROIDISM Thyroid control status:controlled Satisfied with current treatment? yes Medication side effects: no Medication compliance: excellent compliance Recent dose adjustment:no Fatigue: no Cold intolerance: no Heat intolerance: no Weight gain: no Weight loss: no Constipation: no Diarrhea/loose stools: no Palpitations: no Lower extremity edema: no Anxiety/depressed mood: no   Relevant past medical, surgical, family and social history reviewed and updated as indicated. Interim medical history since our last visit reviewed. Allergies and medications reviewed and updated.  Review of Systems  Constitutional: Negative.   Respiratory: Negative.   Cardiovascular: Negative.   Psychiatric/Behavioral: Negative.     Per HPI unless specifically indicated above     Objective:    BP 122/80 (BP Location: Left Arm, Patient Position: Sitting, Cuff Size: Large)   Pulse 96   Temp 98.1 F (36.7 C)   Ht 5\' 2"  (1.575 m)   Wt 168 lb 5 oz (76.3 kg)   SpO2 96%   BMI 30.78 kg/m   Wt Readings from Last 3 Encounters:  01/15/18 168 lb 5 oz (76.3 kg)  01/15/18 168 lb 8 oz (76.4 kg)  07/18/17 164 lb (74.4 kg)    Physical Exam  Constitutional: He is oriented to person, place, and time. He  appears well-developed and well-nourished. No distress.  HENT:  Head: Normocephalic and atraumatic.  Right Ear: Hearing and external ear normal.  Left Ear: Hearing and external ear normal.  Nose: Nose normal.  Mouth/Throat: Oropharynx is clear and moist. No oropharyngeal exudate.  Eyes: Pupils are equal, round, and reactive to light. Conjunctivae, EOM and lids are normal. Right eye exhibits no  discharge. Left eye exhibits no discharge. No scleral icterus.  Neck: Normal range of motion. Neck supple. No JVD present. No tracheal deviation present. No thyromegaly present.  Cardiovascular: Normal rate, regular rhythm, normal heart sounds and intact distal pulses. Exam reveals no gallop and no friction rub.  No murmur heard. Pulmonary/Chest: Effort normal and breath sounds normal. No stridor. No respiratory distress. He has no wheezes. He has no rales. He exhibits no tenderness.  Musculoskeletal: Normal range of motion.  Lymphadenopathy:    He has no cervical adenopathy.  Neurological: He is alert and oriented to person, place, and time.  Skin: Skin is warm, dry and intact. Capillary refill takes less than 2 seconds. No rash noted. He is not diaphoretic. No erythema. No pallor.  Psychiatric: He has a normal mood and affect. His speech is normal and behavior is normal. Judgment and thought content normal. Cognition and memory are normal.    Results for orders placed or performed in visit on 07/18/17  Bayer DCA Hb A1c Waived  Result Value Ref Range   Bayer DCA Hb A1c Waived 5.7 <7.0 %  Comprehensive metabolic panel  Result Value Ref Range   Glucose 105 (H) 65 - 99 mg/dL   BUN 17 8 - 27 mg/dL   Creatinine, Ser 1.15 0.76 - 1.27 mg/dL   GFR calc non Af Amer 68 >59 mL/min/1.73   GFR calc Af Amer 78 >59 mL/min/1.73   BUN/Creatinine Ratio 15 10 - 24   Sodium 140 134 - 144 mmol/L   Potassium 4.4 3.5 - 5.2 mmol/L   Chloride 103 96 - 106 mmol/L   CO2 23 20 - 29 mmol/L   Calcium 9.0 8.6 - 10.2 mg/dL   Total Protein 7.0 6.0 - 8.5 g/dL   Albumin 4.1 3.6 - 4.8 g/dL   Globulin, Total 2.9 1.5 - 4.5 g/dL   Albumin/Globulin Ratio 1.4 1.2 - 2.2   Bilirubin Total 0.2 0.0 - 1.2 mg/dL   Alkaline Phosphatase 51 39 - 117 IU/L   AST 23 0 - 40 IU/L   ALT 26 0 - 44 IU/L  Lipid Panel w/o Chol/HDL Ratio  Result Value Ref Range   Cholesterol, Total 142 100 - 199 mg/dL   Triglycerides 425 (H) 0 - 149  mg/dL   HDL 34 (L) >39 mg/dL   VLDL Cholesterol Cal Comment 5 - 40 mg/dL   LDL Calculated Comment 0 - 99 mg/dL      Assessment & Plan:   Problem List Items Addressed This Visit      Cardiovascular and Mediastinum   Hypertension - Primary    Under good control. Continue current regimen. Continue to monitor. Call with any concerns. Refills given.       Relevant Medications   sildenafil (VIAGRA) 100 MG tablet   rosuvastatin (CRESTOR) 10 MG tablet   lisinopril (PRINIVIL,ZESTRIL) 5 MG tablet   aspirin EC 81 MG tablet   Other Relevant Orders   CBC with Differential/Platelet   Comprehensive metabolic panel     Endocrine   IFG (impaired fasting glucose)    Under good control with A1c of 5.6.  Continue current regimen. Continue to monitor. Call with any concerns.      Relevant Orders   Bayer DCA Hb A1c Waived   CBC with Differential/Platelet   Comprehensive metabolic panel   Hypothyroidism    Under good control. Continue current regimen. Continue to monitor. Call with any concerns. Refills to be given pending labs.       Relevant Orders   CBC with Differential/Platelet   Comprehensive metabolic panel     Genitourinary   BPH with obstruction/lower urinary tract symptoms    Under good control. Continue current regimen. Continue to monitor. Call with any concerns. Refills given.       Relevant Orders   CBC with Differential/Platelet   Comprehensive metabolic panel   UA/M w/rflx Culture, Routine     Other   Anxiety    Under good control. Continue current regimen. Continue to monitor. Call with any concerns. Continue to follow with psychiatry.      Relevant Orders   CBC with Differential/Platelet   Comprehensive metabolic panel   Depression    Under good control. Continue current regimen. Continue to monitor. Call with any concerns. Continue to follow with psychiatry.      Relevant Orders   CBC with Differential/Platelet   Comprehensive metabolic panel    Schizophrenia ([place])    Under good control. Continue current regimen. Continue to monitor. Call with any concerns. Continue to follow with psychiatry.      Relevant Orders   CBC with Differential/Platelet   Comprehensive metabolic panel   Hyperlipidemia    Under good control. Continue current regimen. Continue to monitor. Call with any concerns. Refills given.       Relevant Medications   sildenafil (VIAGRA) 100 MG tablet   rosuvastatin (CRESTOR) 10 MG tablet   lisinopril (PRINIVIL,ZESTRIL) 5 MG tablet   aspirin EC 81 MG tablet   Other Relevant Orders   CBC with Differential/Platelet   Comprehensive metabolic panel   Lipid Panel w/o Chol/HDL Ratio       Follow up plan: Return in about 6 months (around 07/17/2018) for Follow up.

## 2018-01-15 NOTE — Patient Instructions (Signed)
Mr. Geimer , Thank you for taking time to come for your Medicare Wellness Visit. I appreciate your ongoing commitment to your health goals. Please review the following plan we discussed and let me know if I can assist you in the future.   Screening recommendations/referrals: Colonoscopy: completed 06/07/2016 Recommended yearly ophthalmology/optometry visit for glaucoma screening and checkup Recommended yearly dental visit for hygiene and checkup  Vaccinations: Influenza vaccine: due 05/2018 Pneumococcal vaccine: due at age 36 Tdap vaccine: due, check with your insurance company for coverage information  Shingles vaccine: eligible, check with your insurance company for coverage     Advanced directives: Advance directive discussed with you today. I have provided a copy for you to complete at home and have notarized. Once this is complete please bring a copy in to our office so we can scan it into your chart.  Conditions/risks identified: Recommend drinking at least 6-8 glasses of water a day   Next appointment: Follow up in one year for your annual wellness exam.   Preventive Care 40-64 Years, Male Preventive care refers to lifestyle choices and visits with your health care provider that can promote health and wellness. What does preventive care include?  A yearly physical exam. This is also called an annual well check.  Dental exams once or twice a year.  Routine eye exams. Ask your health care provider how often you should have your eyes checked.  Personal lifestyle choices, including:  Daily care of your teeth and gums.  Regular physical activity.  Eating a healthy diet.  Avoiding tobacco and drug use.  Limiting alcohol use.  Practicing safe sex.  Taking low-dose aspirin every day starting at age 18. What happens during an annual well check? The services and screenings done by your health care provider during your annual well check will depend on your age, overall health,  lifestyle risk factors, and family history of disease. Counseling  Your health care provider may ask you questions about your:  Alcohol use.  Tobacco use.  Drug use.  Emotional well-being.  Home and relationship well-being.  Sexual activity.  Eating habits.  Work and work Statistician. Screening  You may have the following tests or measurements:  Height, weight, and BMI.  Blood pressure.  Lipid and cholesterol levels. These may be checked every 5 years, or more frequently if you are over 28 years old.  Skin check.  Lung cancer screening. You may have this screening every year starting at age 80 if you have a 30-pack-year history of smoking and currently smoke or have quit within the past 15 years.  Fecal occult blood test (FOBT) of the stool. You may have this test every year starting at age 70.  Flexible sigmoidoscopy or colonoscopy. You may have a sigmoidoscopy every 5 years or a colonoscopy every 10 years starting at age 53.  Prostate cancer screening. Recommendations will vary depending on your family history and other risks.  Hepatitis C blood test.  Hepatitis B blood test.  Sexually transmitted disease (STD) testing.  Diabetes screening. This is done by checking your blood sugar (glucose) after you have not eaten for a while (fasting). You may have this done every 1-3 years. Discuss your test results, treatment options, and if necessary, the need for more tests with your health care provider. Vaccines  Your health care provider may recommend certain vaccines, such as:  Influenza vaccine. This is recommended every year.  Tetanus, diphtheria, and acellular pertussis (Tdap, Td) vaccine. You may need a Td  booster every 10 years.  Zoster vaccine. You may need this after age 63.  Pneumococcal 13-valent conjugate (PCV13) vaccine. You may need this if you have certain conditions and have not been vaccinated.  Pneumococcal polysaccharide (PPSV23) vaccine. You may  need one or two doses if you smoke cigarettes or if you have certain conditions. Talk to your health care provider about which screenings and vaccines you need and how often you need them. This information is not intended to replace advice given to you by your health care provider. Make sure you discuss any questions you have with your health care provider. Document Released: 10/02/2015 Document Revised: 05/25/2016 Document Reviewed: 07/07/2015 Elsevier Interactive Patient Education  2017 [place] Prevention in the Home Falls can cause injuries. They can happen to people of all ages. There are many things you can do to make your home safe and to help prevent falls. What can I do on the outside of my home?  Regularly fix the edges of walkways and driveways and fix any cracks.  Remove anything that might make you trip as you walk through a door, such as a raised step or threshold.  Trim any bushes or trees on the path to your home.  Use bright outdoor lighting.  Clear any walking paths of anything that might make someone trip, such as rocks or tools.  Regularly check to see if handrails are loose or broken. Make sure that both sides of any steps have handrails.  Any raised decks and porches should have guardrails on the edges.  Have any leaves, snow, or ice cleared regularly.  Use sand or salt on walking paths during winter.  Clean up any spills in your garage right away. This includes oil or grease spills. What can I do in the bathroom?  Use night lights.  Install grab bars by the toilet and in the tub and shower. Do not use towel bars as grab bars.  Use non-skid mats or decals in the tub or shower.  If you need to sit down in the shower, use a plastic, non-slip stool.  Keep the floor dry. Clean up any water that spills on the floor as soon as it happens.  Remove soap buildup in the tub or shower regularly.  Attach bath mats securely with double-sided non-slip  rug tape.  Do not have throw rugs and other things on the floor that can make you trip. What can I do in the bedroom?  Use night lights.  Make sure that you have a light by your bed that is easy to reach.  Do not use any sheets or blankets that are too big for your bed. They should not hang down onto the floor.  Have a firm chair that has side arms. You can use this for support while you get dressed.  Do not have throw rugs and other things on the floor that can make you trip. What can I do in the kitchen?  Clean up any spills right away.  Avoid walking on wet floors.  Keep items that you use a lot in easy-to-reach places.  If you need to reach something above you, use a strong step stool that has a grab bar.  Keep electrical cords out of the way.  Do not use floor polish or wax that makes floors slippery. If you must use wax, use non-skid floor wax.  Do not have throw rugs and other things on the floor that can make you trip.  What can I do with my stairs?  Do not leave any items on the stairs.  Make sure that there are handrails on both sides of the stairs and use them. Fix handrails that are broken or loose. Make sure that handrails are as long as the stairways.  Check any carpeting to make sure that it is firmly attached to the stairs. Fix any carpet that is loose or worn.  Avoid having throw rugs at the top or bottom of the stairs. If you do have throw rugs, attach them to the floor with carpet tape.  Make sure that you have a light switch at the top of the stairs and the bottom of the stairs. If you do not have them, ask someone to add them for you. What else can I do to help prevent falls?  Wear shoes that:  Do not have high heels.  Have rubber bottoms.  Are comfortable and fit you well.  Are closed at the toe. Do not wear sandals.  If you use a stepladder:  Make sure that it is fully opened. Do not climb a closed stepladder.  Make sure that both sides of  the stepladder are locked into place.  Ask someone to hold it for you, if possible.  Clearly mark and make sure that you can see:  Any grab bars or handrails.  First and last steps.  Where the edge of each step is.  Use tools that help you move around (mobility aids) if they are needed. These include:  Canes.  Walkers.  Scooters.  Crutches.  Turn on the lights when you go into a dark area. Replace any light bulbs as soon as they burn out.  Set up your furniture so you have a clear path. Avoid moving your furniture around.  If any of your floors are uneven, fix them.  If there are any pets around you, be aware of where they are.  Review your medicines with your doctor. Some medicines can make you feel dizzy. This can increase your chance of falling. Ask your doctor what other things that you can do to help prevent falls. This information is not intended to replace advice given to you by your health care provider. Make sure you discuss any questions you have with your health care provider. Document Released: 07/02/2009 Document Revised: 02/11/2016 Document Reviewed: 10/10/2014 Elsevier Interactive Patient Education  2017 [employer].

## 2018-01-16 ENCOUNTER — Encounter: Payer: Self-pay | Admitting: Family Medicine

## 2018-01-16 ENCOUNTER — Ambulatory Visit: Payer: Medicare HMO | Admitting: Family Medicine

## 2018-01-16 LAB — COMPREHENSIVE METABOLIC PANEL
ALT: 26 IU/L (ref 0–44)
AST: 25 IU/L (ref 0–40)
Albumin/Globulin Ratio: 1.2 (ref 1.2–2.2)
Albumin: 4.2 g/dL (ref 3.6–4.8)
Alkaline Phosphatase: 49 IU/L (ref 39–117)
BUN/Creatinine Ratio: 13 (ref 10–24)
BUN: 17 mg/dL (ref 8–27)
Bilirubin Total: 0.2 mg/dL (ref 0.0–1.2)
CALCIUM: 9.6 mg/dL (ref 8.6–10.2)
CO2: 20 mmol/L (ref 20–29)
Chloride: 107 mmol/L — ABNORMAL HIGH (ref 96–106)
Creatinine, Ser: 1.32 mg/dL — ABNORMAL HIGH (ref 0.76–1.27)
GFR, EST AFRICAN AMERICAN: 66 mL/min/{1.73_m2} (ref >59)
GFR, EST NON AFRICAN AMERICAN: 57 mL/min/{1.73_m2} — AB (ref >59)
GLUCOSE: 92 mg/dL (ref 65–99)
Globulin, Total: 3.4 g/dL (ref 1.5–4.5)
Potassium: 4.5 mmol/L (ref 3.5–5.2)
Sodium: 142 mmol/L (ref 134–144)
TOTAL PROTEIN: 7.6 g/dL (ref 6.0–8.5)

## 2018-01-16 LAB — CBC WITH DIFFERENTIAL/PLATELET
BASOS ABS: 0 10*3/uL (ref 0.0–0.2)
Basos: 0 %
EOS (ABSOLUTE): 0.4 10*3/uL (ref 0.0–0.4)
Eos: 4 %
Hematocrit: 43.2 % (ref 37.5–51.0)
Hemoglobin: 14.9 g/dL (ref 13.0–17.7)
IMMATURE GRANS (ABS): 0 10*3/uL (ref 0.0–0.1)
IMMATURE GRANULOCYTES: 0 %
LYMPHS: 32 %
Lymphocytes Absolute: 3.1 10*3/uL (ref 0.7–3.1)
MCH: 32.5 pg (ref 26.6–33.0)
MCHC: 34.5 g/dL (ref 31.5–35.7)
MCV: 94 fL (ref 79–97)
Monocytes Absolute: 0.9 10*3/uL (ref 0.1–0.9)
Monocytes: 10 %
NEUTROS PCT: 54 %
Neutrophils Absolute: 5.2 10*3/uL (ref 1.4–7.0)
PLATELETS: 307 10*3/uL (ref 150–379)
RBC: 4.59 x10E6/uL (ref 4.14–5.80)
RDW: 14.2 % (ref 12.3–15.4)
WBC: 9.7 10*3/uL (ref 3.4–10.8)

## 2018-01-16 LAB — LIPID PANEL W/O CHOL/HDL RATIO
Cholesterol, Total: 167 mg/dL (ref 100–199)
HDL: 40 mg/dL (ref >39)
LDL Calculated: 50 mg/dL (ref 0–99)
TRIGLYCERIDES: 383 mg/dL — AB (ref 0–149)
VLDL CHOLESTEROL CAL: 77 mg/dL — AB (ref 5–40)

## 2018-02-11 IMAGING — CR DG SHOULDER 2+V*R*
3 series · 3 of 3 positions shown · non-contrast
Comparison: 03/01/2014

CLINICAL DATA: Chronic right shoulder pain.

EXAM:
RIGHT SHOULDER - 2+ VIEW

[shoulder grashey]
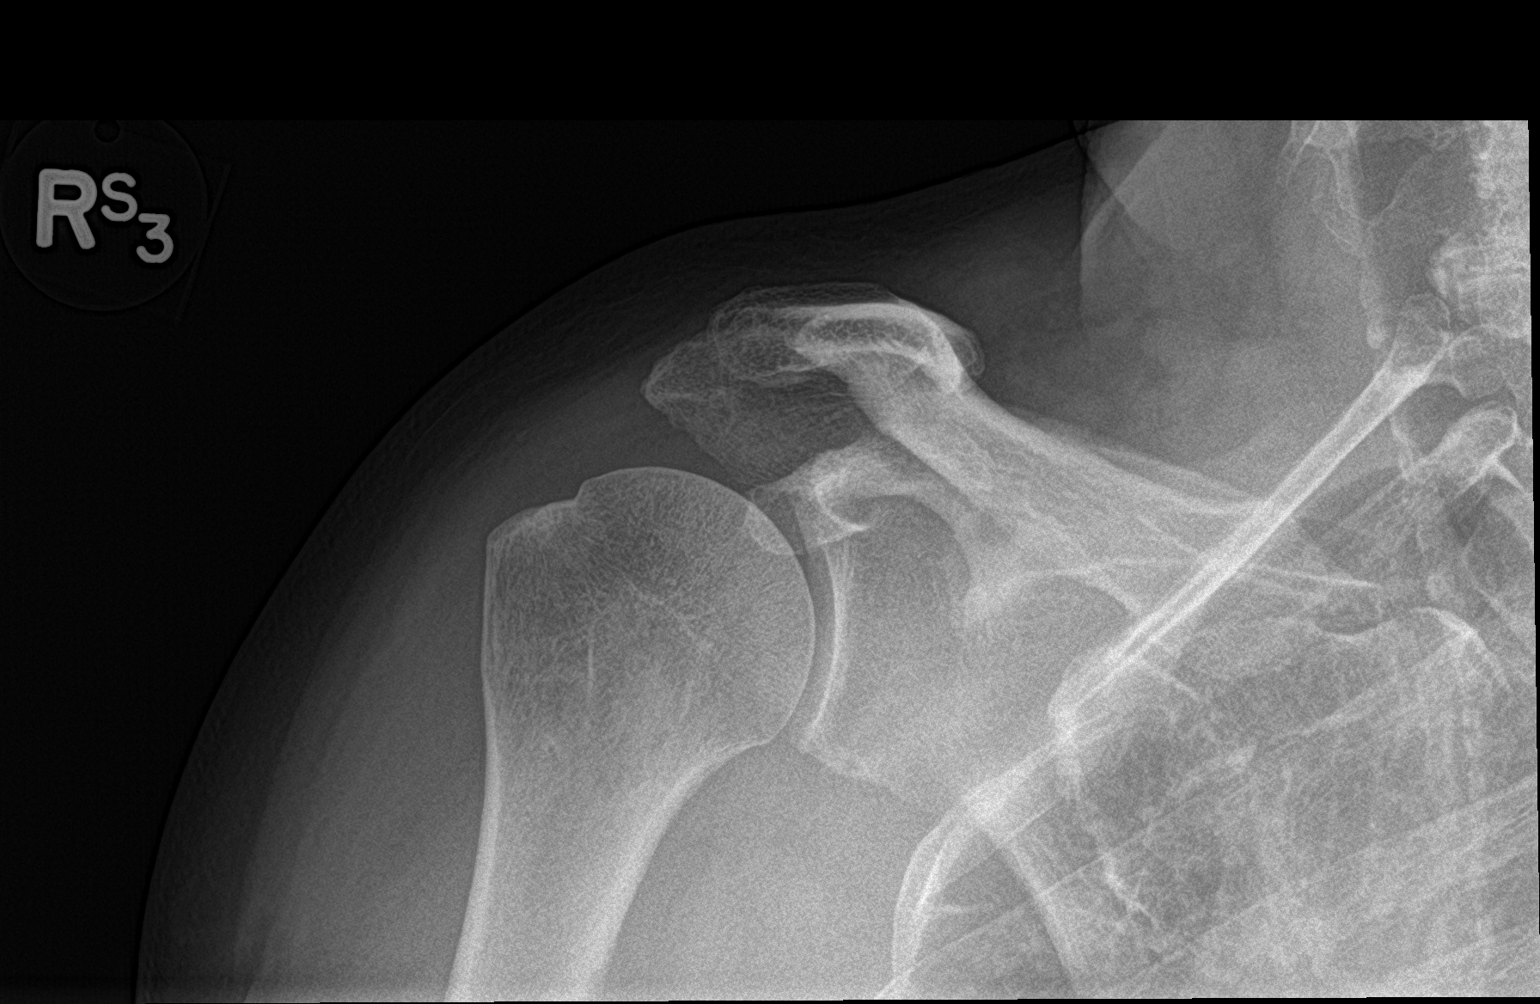

[shoulder y view]
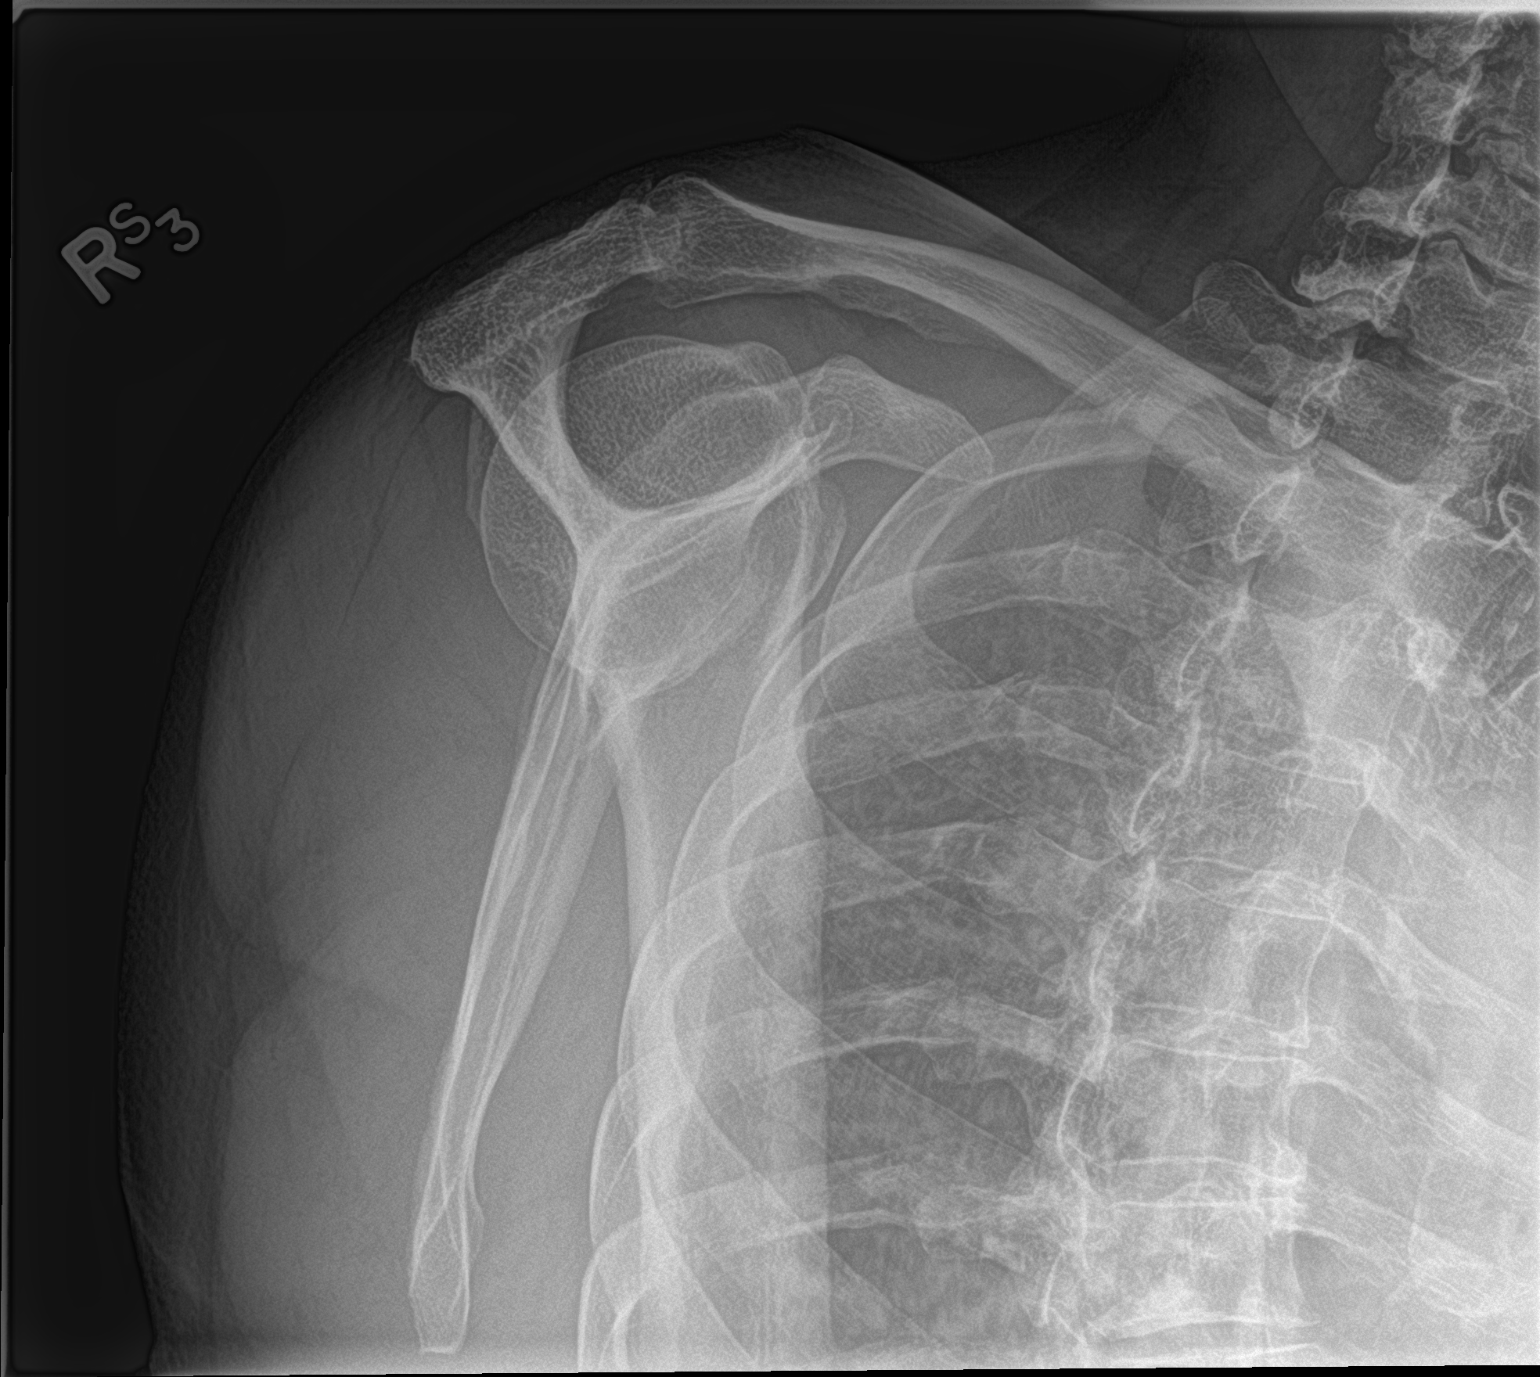

[shoulder axillary]
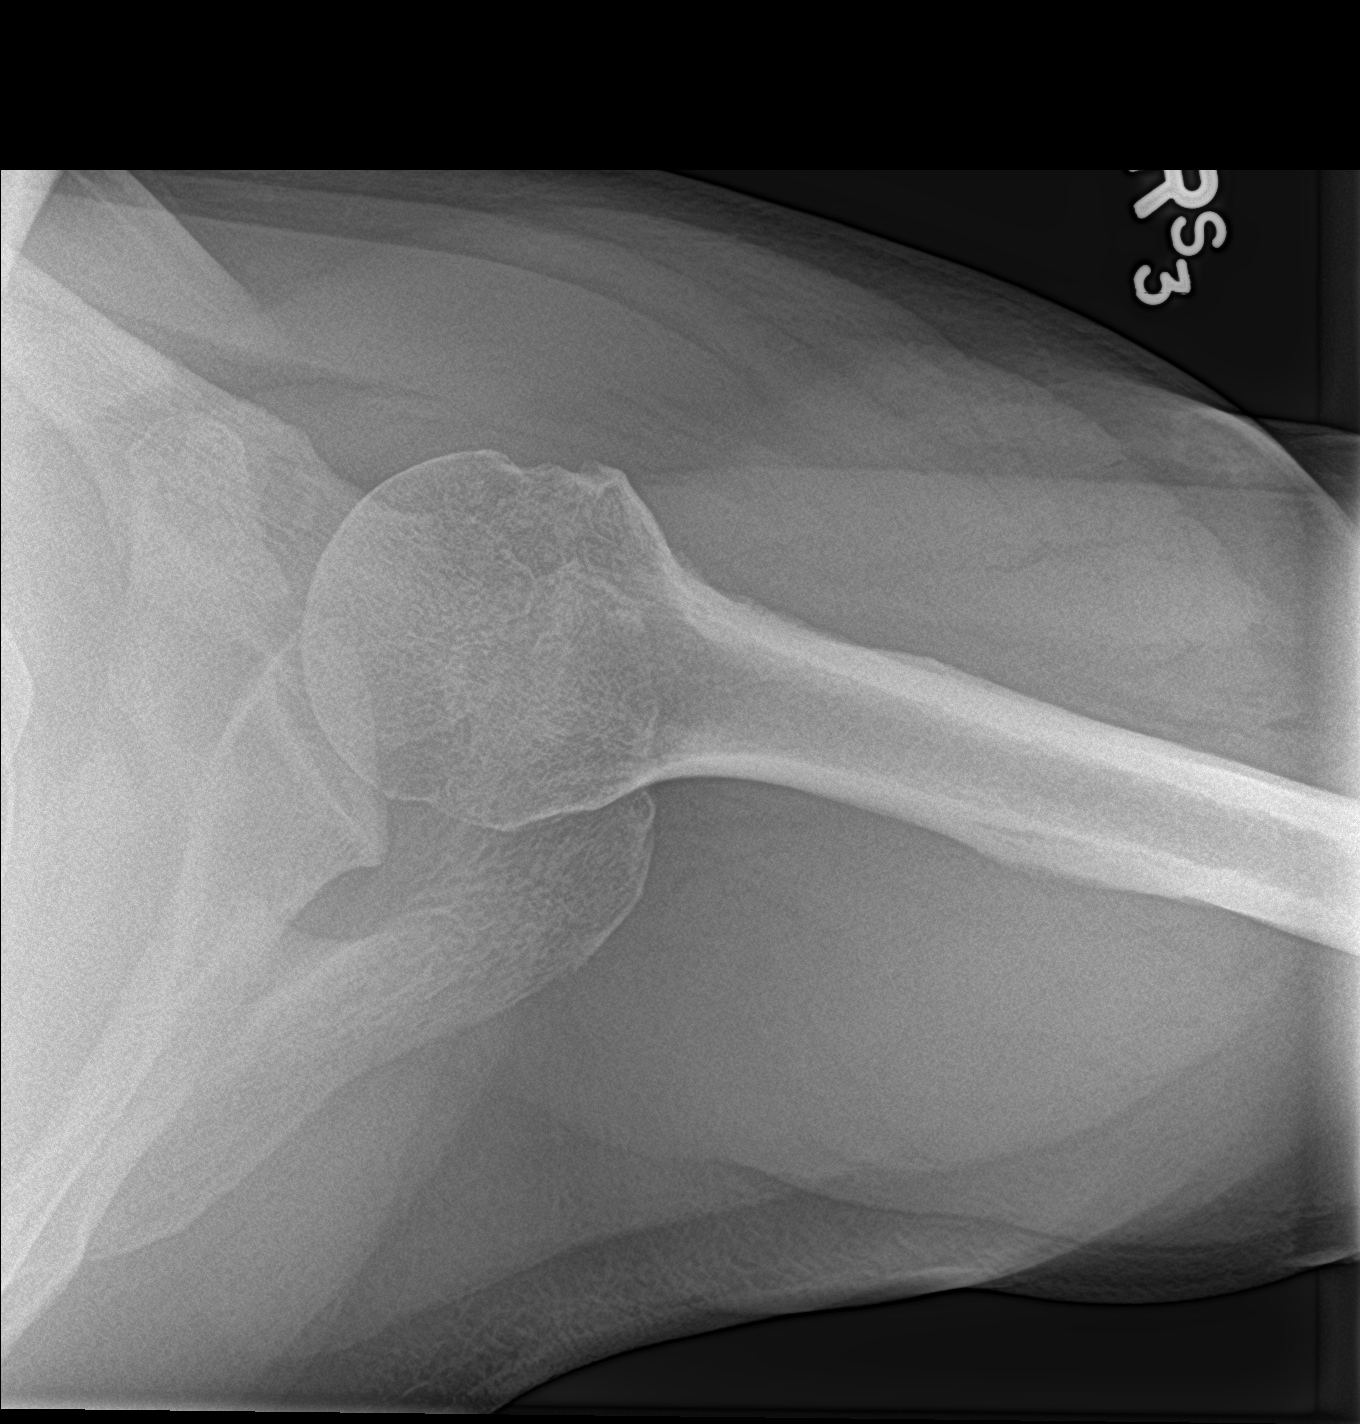

[3 of 3 positions shown; findings below may reference images not displayed]

FINDINGS: Right shoulder is located. No fracture. No gross abnormality to the
right AC joint. Minimal degenerative changes near the inferior
glenohumeral joint.
IMPRESSION: No acute abnormality.

## 2018-03-30 DIAGNOSIS — H2513 Age-related nuclear cataract, bilateral: Secondary | ICD-10-CM | POA: Diagnosis not present

## 2018-07-03 ENCOUNTER — Ambulatory Visit (INDEPENDENT_AMBULATORY_CARE_PROVIDER_SITE_OTHER): Payer: Medicare Other

## 2018-07-03 DIAGNOSIS — Z23 Encounter for immunization: Secondary | ICD-10-CM | POA: Diagnosis not present

## 2018-07-12 ENCOUNTER — Other Ambulatory Visit: Payer: Self-pay | Admitting: Family Medicine

## 2018-07-12 DIAGNOSIS — I10 Essential (primary) hypertension: Secondary | ICD-10-CM

## 2018-07-12 NOTE — Telephone Encounter (Signed)
Requested Prescriptions  Pending Prescriptions Disp Refills  . rosuvastatin (CRESTOR) 10 MG tablet [Pharmacy Med Name: ROSUVASTATIN  10MG   TAB] 90 tablet 1    Sig: TAKE 1 TABLET BY MOUTH  DAILY     Cardiovascular:  Antilipid - Statins Failed - 07/12/2018  6:00 AM      Failed - Triglycerides in normal range and within 360 days    Triglycerides  Date Value Ref Range Status  01/15/2018 383 (H) 0 - 149 mg/dL Final   Triglycerides Piccolo,Waived  Date Value Ref Range Status  03/31/2016 338 (H) <150 mg/dL Final    Comment:                            Normal                   <150                         Borderline High     150 - 199                         High                200 - 499                         Very High                >499          Passed - Total Cholesterol in normal range and within 360 days    Cholesterol, Total  Date Value Ref Range Status  01/15/2018 167 100 - 199 mg/dL Final   Cholesterol Piccolo, Waived  Date Value Ref Range Status  03/31/2016 155 <200 mg/dL Final    Comment:                            Desirable                <200                         Borderline High      200- 239                         High                     >239          Passed - LDL in normal range and within 360 days    LDL Calculated  Date Value Ref Range Status  01/15/2018 50 0 - 99 mg/dL Final         Passed - HDL in normal range and within 360 days    HDL  Date Value Ref Range Status  01/15/2018 40 >39 mg/dL Final         Passed - Patient is not pregnant      Passed - Valid encounter within last 12 months    Recent Outpatient Visits          5 months ago Essential hypertension   [place], Megan P, DO   11 months ago Essential hypertension   [place], [place], DO  1 year ago Essential hypertension   [place], [place], DO   1 year ago Medicare annual wellness visit, subsequent   [place], [place], DO   1 year ago Essential hypertension   [place], [place], DO      Future Appointments            In 6 months  [facility], [place]   In 6 months [place], Barb Mer[place], DO [facility], [facility]         . lisinopril (PRINIVIL,ZESTRIL) 5 MG tablet [Pharmacy Med Name: LISINOPRIL  5MG   TAB] 135 tablet 1    Sig: TAKE 1 AND 1/2 TABLETS BY  MOUTH ONCE DAILY     Cardiovascular:  ACE Inhibitors Failed - 07/12/2018  6:00 AM      Failed - Cr in normal range and within 180 days    Creatinine  Date Value Ref Range Status  09/07/2012 1.20 0.60 - 1.30 mg/dL Final   Creatinine, Ser  Date Value Ref Range Status  01/15/2018 1.32 (H) 0.76 - 1.27 mg/dL Final         Passed - K in normal range and within 180 days    Potassium  Date Value Ref Range Status  01/15/2018 4.5 3.5 - 5.2 mmol/L Final  09/07/2012 3.7 3.5 - 5.1 mmol/L Final         Passed - Patient is not pregnant      Passed - Last BP in normal range    BP Readings from Last 1 Encounters:  01/15/18 122/80         Passed - Valid encounter within last 6 months    Recent Outpatient Visits          5 months ago Essential hypertension   [place], Megan P, DO   11 months ago Essential hypertension   [place], [place], DO   1 year ago Essential hypertension   [place], Megan P, DO   1 year ago Medicare annual wellness visit, subsequent   [employer], [place], DO   1 year ago Essential hypertension   [place], [place], DO      Future Appointments            In 6 months  [place], [place]   In 6 months [place], [place], DO [facility], [facility]

## 2018-08-02 DIAGNOSIS — L821 Other seborrheic keratosis: Secondary | ICD-10-CM | POA: Diagnosis not present

## 2018-08-02 DIAGNOSIS — L720 Epidermal cyst: Secondary | ICD-10-CM | POA: Diagnosis not present

## 2018-09-15 ENCOUNTER — Other Ambulatory Visit: Payer: Self-pay | Admitting: Family Medicine

## 2018-09-17 NOTE — Telephone Encounter (Signed)
No recent TSH-failed protocol / Sending to provider for review

## 2018-10-12 ENCOUNTER — Emergency Department
Admission: EM | Admit: 2018-10-12 | Discharge: 2018-10-12 | Disposition: A | Discharge status: Home / Self Care | Payer: Medicare Other | Attending: Emergency Medicine | Admitting: Emergency Medicine

## 2018-10-12 ENCOUNTER — Other Ambulatory Visit: Payer: Self-pay

## 2018-10-12 ENCOUNTER — Encounter: Payer: Self-pay | Admitting: Emergency Medicine

## 2018-10-12 DIAGNOSIS — Z87891 Personal history of nicotine dependence: Secondary | ICD-10-CM | POA: Diagnosis not present

## 2018-10-12 DIAGNOSIS — Z79899 Other long term (current) drug therapy: Secondary | ICD-10-CM | POA: Diagnosis not present

## 2018-10-12 DIAGNOSIS — Z7982 Long term (current) use of aspirin: Secondary | ICD-10-CM | POA: Insufficient documentation

## 2018-10-12 DIAGNOSIS — E039 Hypothyroidism, unspecified: Secondary | ICD-10-CM | POA: Insufficient documentation

## 2018-10-12 DIAGNOSIS — I10 Essential (primary) hypertension: Secondary | ICD-10-CM | POA: Diagnosis not present

## 2018-10-12 DIAGNOSIS — R1012 Left upper quadrant pain: Secondary | ICD-10-CM | POA: Insufficient documentation

## 2018-10-12 DIAGNOSIS — Z85828 Personal history of other malignant neoplasm of skin: Secondary | ICD-10-CM | POA: Insufficient documentation

## 2018-10-12 NOTE — ED Triage Notes (Signed)
Pt has had LUQ abdominal pain X 1 month. Pt reports it "buldges" and he decided to come in and make sure it is not an aneurysm or hernia.  Asked what about his pain made him come today, and pt states "just thought I would get it checked out".  Pain not worse than when started one month ago. Denies NVD. No fevers.  Ambulatory. NAD. VSS.

## 2018-10-12 NOTE — ED Provider Notes (Signed)
[facility] Emergency Department Provider Note   ____________________________________________    I have reviewed the triage vital signs and the nursing notes.   HISTORY  Chief Complaint Abdominal Pain     HPI Mitchell [name] is a 63 y.o. male who presents with complaints of left upper quadrant abdominal pain which he reports is been intermittent over the last 8 to 10 months.  He states that he has no pain currently.  Sometimes he feels it when he bends over after eating.  He does report a history of constipation but cannot take laxatives because they do not make him feel good.  Has not seen his primary for this, has not taken anything for this.  No nausea or vomiting.  No fevers or chills.  Currently feels quite well.  Presents today because he is concerned he could have an aneurysm or hernia and wants to be checked out  Past Medical History:  Diagnosis Date  . Anxiety   . Cancer ([place])    skin cancer  . Depression   . Epididymitis   . Hepatitis C    Tested positive, treated now told that he does not have it  . Hydrocele   . Hypertension   . IFG (impaired fasting glucose)   . Insomnia   . Kidney stone   . Schizophrenia [facility])     Patient Active Problem List   Diagnosis Date Noted  . Special screening for malignant neoplasms, colon   . Problems with swallowing and mastication   . Gastritis   . Swollen testicle 08/14/2015  . BPH with obstruction/lower urinary tract symptoms 08/14/2015  . Hyperlipidemia 07/22/2015  . Hypothyroidism 07/17/2015  . Scrotal swelling 07/16/2015  . Hydrocele, right 07/08/2015  . Drug-induced erectile dysfunction 07/08/2015  . IFG (impaired fasting glucose)   . Schizophrenia ([place])   . Insomnia   . Anxiety   . Hypertension   . Depression   . Hepatitis C     Past Surgical History:  Procedure Laterality Date  . ANKLE FRACTURE SURGERY    . COLONOSCOPY  2010  . COLONOSCOPY WITH PROPOFOL  06/07/2016   Procedure:  COLONOSCOPY WITH PROPOFOL;  Surgeon: [name], MD;  Location: [facility] ENDOSCOPY;  Service: Endoscopy;;  . ESOPHAGOGASTRODUODENOSCOPY (EGD) WITH PROPOFOL N/A 06/07/2016   Procedure: ESOPHAGOGASTRODUODENOSCOPY (EGD) WITH PROPOFOL;  Surgeon: [name], MD;  Location: [facility] ENDOSCOPY;  Service: Endoscopy;  Laterality: N/A;  . HERNIA REPAIR  2011   X 2    Prior to Admission medications   Medication Sig Start Date End Date Taking? Authorizing Provider  aspirin EC 81 MG tablet Take 1 tablet (81 mg total) by mouth daily. 01/15/18   Johnson, Megan P, DO  clonazePAM (KLONOPIN) 0.5 MG tablet Take 0.5 mg by mouth 3 (three) times daily as needed for anxiety.    [provider]  FLOVENT HFA 220 MCG/ACT inhaler  01/10/17   [provider]  levothyroxine (SYNTHROID, LEVOTHROID) 50 MCG tablet TAKE 1 TABLET BY MOUTH  DAILY BEFORE BREAKFAST 09/17/18   Johnson, Megan P, DO  lisinopril (PRINIVIL,ZESTRIL) 5 MG tablet TAKE 1 AND 1/2 TABLETS BY  MOUTH ONCE DAILY 07/12/18   Johnson, Megan P, DO  loratadine (CLARITIN) 10 MG tablet Take 10 mg by mouth daily.    [provider]  Multiple Vitamin (MULTI-VITAMINS) TABS Take by mouth.    [provider]  rosuvastatin (CRESTOR) 10 MG tablet TAKE 1 TABLET BY MOUTH  DAILY 07/12/18   [name],  Megan P, DO  sildenafil (VIAGRA) 100 MG tablet Take 1 tablet (100 mg total) by mouth daily as needed for erectile dysfunction. 01/15/18   Park Liter P, DO     Allergies Haloperidol decanoate; Atorvastatin; and Codeine  Family History  Problem Relation Age of Onset  . Diabetes Mother   . Alzheimer's disease Father   . Diabetes Brother   . Diabetes Maternal Grandfather     Social History Social History   Tobacco Use  . Smoking status: Former Smoker    Last attempt to quit: 07/06/1995    Years since quitting: 23.2  . Smokeless tobacco: Former Network engineer Use Topics  . Alcohol use: Yes    Alcohol/week: 1.0 standard drinks    Types: 1  Cans of beer per week    Comment: remote history, quit 20+ years ago (1 beer q 2-3 days)  . Drug use: No    Comment: remote history, quit 20= years ago    Review of Systems  Constitutional: No fever/chills Eyes: No visual changes.  ENT: No sore throat. Cardiovascular: Denies chest pain. Respiratory: Denies shortness of breath. Gastrointestinal: As above Genitourinary: Negative for dysuria. Musculoskeletal: Negative for back pain. Skin: Negative for rash. Neurological: Negative for headaches    ____________________________________________   PHYSICAL EXAM:  VITAL SIGNS: ED Triage Vitals  Enc Vitals Group     BP 10/12/18 0823 (!) 106/52     Pulse Rate 10/12/18 0823 72     Resp 10/12/18 0823 16     Temp 10/12/18 0823 (!) 97.5 F (36.4 C)     Temp Source 10/12/18 0823 Oral     SpO2 10/12/18 0823 96 %     Weight 10/12/18 0823 72.6 kg (160 lb)     Height 10/12/18 0823 1.575 m (5\' 2" )     Head Circumference --      Peak Flow --      Pain Score 10/12/18 0821 9     Pain Loc --      Pain Edu? --      Excl. in [place]? --     Constitutional: Alert and oriented. No acute distress.  Eyes: Conjunctivae are normal.   Nose: No congestion/rhinnorhea. Mouth/Throat: Mucous membranes are moist.    Cardiovascular: Normal rate, regular rhythm. Grossly normal heart sounds.  Good peripheral circulation. Respiratory: Normal respiratory effort.  No retractions. Lungs CTAB. Gastrointestinal: Soft and nontender. No distention.  No CVA tenderness.  No hernia, no bruit, no pulsatile mass  Musculoskeletal: Warm and well perfused Neurologic:  Normal speech and language. No gross focal neurologic deficits are appreciated.  Skin:  Skin is warm, dry and intact.  Psychiatric: Mood and affect are normal. Speech and behavior are normal.  ____________________________________________   LABS (all labs ordered are listed, but only abnormal results are displayed)  Labs Reviewed - No data to  display ____________________________________________  EKG  ED ECG REPORT I, Lavonia Drafts, the attending physician, personally viewed and interpreted this ECG.  Date: 10/12/2018  Rhythm: normal sinus rhythm QRS Axis: normal Intervals: normal ST/T Wave abnormalities: normal Narrative Interpretation: no evidence of acute ischemia  ____________________________________________  RADIOLOGY  None ____________________________________________   PROCEDURES  Procedure(s) performed: No  Procedures   Critical Care performed: No ____________________________________________   INITIAL IMPRESSION / ASSESSMENT AND PLAN / ED COURSE  Pertinent labs & imaging results that were available during my care of the patient were reviewed by me and considered in my medical decision making (see chart for details).  Patient well-appearing and in no acute distress.  No abdominal pain currently.  Very reassuring exam, no evidence of hernia or aneurysm.  No indication for labs or imaging at this time given that this is a chronic condition, recommended outpatient follow-up with PCP    ____________________________________________   FINAL CLINICAL IMPRESSION(S) / ED DIAGNOSES  Final diagnoses:  Left upper quadrant pain        Note:  This document was prepared using Dragon voice recognition software and may include unintentional dictation errors.   Lavonia Drafts, MD 10/12/18 [phone]

## 2018-10-12 NOTE — ED Triage Notes (Signed)
Pt called for triage. Has to use bathroom. Given cup to obtain urine sample.

## 2018-10-15 ENCOUNTER — Ambulatory Visit: Payer: Self-pay | Admitting: *Deleted

## 2018-10-15 NOTE — Telephone Encounter (Signed)
Contacted pt regarding concerns; he has questions vaccine; he would like to know if he had received one, and how often should he receive one; immunization record shows that pt received herpes zoster 06/24/15 at [place]; discussed with pt recommendations for  2-shot dose that is  now available; the pt states that when he is seen in the office on 01/21/2019 he will discuss this with his provider; will route to office for notification.   Reason for Disposition . Prevention of Shingles (vaccine info), questions about  Protocols used: [facility]

## 2018-10-16 ENCOUNTER — Ambulatory Visit (INDEPENDENT_AMBULATORY_CARE_PROVIDER_SITE_OTHER): Payer: Medicare Other | Admitting: Family Medicine

## 2018-10-16 ENCOUNTER — Encounter: Payer: Self-pay | Admitting: Family Medicine

## 2018-10-16 VITALS — BP 112/74 | HR 71 | Temp 97.8°F | Wt 175.0 lb

## 2018-10-16 DIAGNOSIS — R7303 Prediabetes: Secondary | ICD-10-CM | POA: Insufficient documentation

## 2018-10-16 DIAGNOSIS — I10 Essential (primary) hypertension: Secondary | ICD-10-CM | POA: Diagnosis not present

## 2018-10-16 DIAGNOSIS — E038 Other specified hypothyroidism: Secondary | ICD-10-CM | POA: Diagnosis not present

## 2018-10-16 DIAGNOSIS — K5900 Constipation, unspecified: Secondary | ICD-10-CM | POA: Diagnosis not present

## 2018-10-16 DIAGNOSIS — E782 Mixed hyperlipidemia: Secondary | ICD-10-CM

## 2018-10-16 DIAGNOSIS — R030 Elevated blood-pressure reading, without diagnosis of hypertension: Secondary | ICD-10-CM | POA: Insufficient documentation

## 2018-10-16 DIAGNOSIS — R7301 Impaired fasting glucose: Secondary | ICD-10-CM

## 2018-10-16 DIAGNOSIS — N138 Other obstructive and reflux uropathy: Secondary | ICD-10-CM

## 2018-10-16 DIAGNOSIS — N401 Enlarged prostate with lower urinary tract symptoms: Secondary | ICD-10-CM

## 2018-10-16 DIAGNOSIS — F209 Schizophrenia, unspecified: Secondary | ICD-10-CM

## 2018-10-16 LAB — MICROALBUMIN, URINE WAIVED
Creatinine, Urine Waived: 200 mg/dL (ref 10–300)
Microalb, Ur Waived: 10 mg/L (ref 0–19)
Microalb/Creat Ratio: <30 mg/g (ref <30)

## 2018-10-16 LAB — UA/M W/RFLX CULTURE, ROUTINE
Bilirubin, UA: NEGATIVE
Glucose, UA: NEGATIVE
KETONES UA: NEGATIVE
Leukocytes, UA: NEGATIVE
NITRITE UA: NEGATIVE
Protein, UA: NEGATIVE
RBC, UA: NEGATIVE
Specific Gravity, UA: 1.025 (ref 1.005–1.030)
Urobilinogen, Ur: 0.2 mg/dL (ref 0.2–1.0)
pH, UA: 6.5 (ref 5.0–7.5)

## 2018-10-16 LAB — BAYER DCA HB A1C WAIVED: HB A1C (BAYER DCA - WAIVED): 6 % (ref <7.0)

## 2018-10-16 MED ORDER — POLYETHYLENE GLYCOL 3350 17 GM/SCOOP PO POWD: 17.0000 g | Freq: Two times a day (BID) | ORAL | 1 refills | Status: DC | PRN

## 2018-10-16 MED ORDER — SILDENAFIL CITRATE 100 MG PO TABS: 100.0000 mg | ORAL_TABLET | Freq: Every day | ORAL | 6 refills | Status: DC | PRN

## 2018-10-16 MED ORDER — LISINOPRIL 5 MG PO TABS: ORAL_TABLET | ORAL | 1 refills | Status: DC

## 2018-10-16 MED ORDER — ROSUVASTATIN CALCIUM 10 MG PO TABS: 10.0000 mg | ORAL_TABLET | Freq: Every day | ORAL | 1 refills | Status: DC

## 2018-10-16 NOTE — Assessment & Plan Note (Signed)
Under good control on current regimen. Continue current regimen. Continue to monitor. Call with any concerns. Refills given. Labs checked today.  

## 2018-10-16 NOTE — Assessment & Plan Note (Signed)
Follows with psychiatry. Continue to follow with them. Stable. Call with any concerns.

## 2018-10-16 NOTE — Assessment & Plan Note (Signed)
Better on recheck. Continue current regimen. Continue to monitor. Call with any concerns.  

## 2018-10-16 NOTE — Assessment & Plan Note (Signed)
Rechecking levels today. Call with any concerns. Await results. Treat as needed.

## 2018-10-16 NOTE — Progress Notes (Signed)
BP 112/74 (BP Location: Left Arm, Cuff Size: Normal)   Pulse 71   Temp 97.8 F (36.6 C) (Oral)   Wt 175 lb (79.4 kg)   SpO2 99%   BMI 32.01 kg/m    Subjective:    Patient ID: Mitchell [name], male    DOB: 1956-08-20, 63 y.o.   MRN: [id number]  HPI: Mitchell [name] is a 63 y.o. male  Chief Complaint  Patient presents with  . Constipation    Has Diverticulitis dx 4-5 years ago, States he is not drinking enough water causing constipation. Does not want to take laxative w/o doctors advice because it causes his BP dropped.  Has increased water, Eating prunes and started taking a probiotic.    HYPERTENSION / HYPERLIPIDEMIA Satisfied with current treatment? yes Duration of hypertension: chronic BP monitoring frequency: not checking BP medication side effects: no Past BP meds: lisinopril Duration of hyperlipidemia: chronic Cholesterol medication side effects: no Cholesterol supplements: none Past cholesterol medications: crestor Medication compliance: excellent compliance Aspirin: yes Recent stressors: no Recurrent headaches: no Visual changes: no Palpitations: no Dyspnea: no Chest pain: no Lower extremity edema: no Dizzy/lightheaded: no  Impaired Fasting Glucose HbA1C:  Lab Results  Component Value Date   HGBA1C 5.6 01/15/2018   Duration of elevated blood sugar: chronic Polydipsia: no Polyuria: no Weight change: no Visual disturbance: no Glucose Monitoring: no Diabetic Education: Not Completed Family history of diabetes: yes  ABDOMINAL ISSUES Duration: about a week Nature: bloated and full feeling Location: diffuse  Severity: mild  Radiation: no Episode duration: Frequency: intermittent Treatments attempted: none Constipation: yes Diarrhea: no Mucous in the stool: no Heartburn: no Bloating:no Flatulence: no Nausea: no Vomiting: no Melena or hematochezia: no Rash: no Jaundice: no Fever: no Weight loss: no    Relevant past medical, surgical,  family and social history reviewed and updated as indicated. Interim medical history since our last visit reviewed. Allergies and medications reviewed and updated.  Review of Systems  Constitutional: Negative.   Respiratory: Negative.   Cardiovascular: Negative.   Gastrointestinal: Positive for constipation. Negative for abdominal distention, abdominal pain, anal bleeding, blood in stool, diarrhea, nausea, rectal pain and vomiting.  Musculoskeletal: Negative.   Skin: Negative.   Neurological: Negative.   Psychiatric/Behavioral: Negative.     Per HPI unless specifically indicated above     Objective:    BP 112/74 (BP Location: Left Arm, Cuff Size: Normal)   Pulse 71   Temp 97.8 F (36.6 C) (Oral)   Wt 175 lb (79.4 kg)   SpO2 99%   BMI 32.01 kg/m   Wt Readings from Last 3 Encounters:  10/16/18 175 lb (79.4 kg)  10/12/18 160 lb (72.6 kg)  01/15/18 168 lb 5 oz (76.3 kg)    Physical Exam Vitals signs and nursing note reviewed.  Constitutional:      General: He is not in acute distress.    Appearance: Normal appearance. He is not ill-appearing, toxic-appearing or diaphoretic.  HENT:     Head: Normocephalic and atraumatic.     Right Ear: External ear normal.     Left Ear: External ear normal.     Nose: Nose normal.     Mouth/Throat:     Mouth: Mucous membranes are moist.     Pharynx: Oropharynx is clear.  Eyes:     General: No scleral icterus.       Right eye: No discharge.        Left eye: No discharge.  Extraocular Movements: Extraocular movements intact.     Conjunctiva/sclera: Conjunctivae normal.     Pupils: Pupils are equal, round, and reactive to light.  Neck:     Musculoskeletal: Normal range of motion and neck supple.  Cardiovascular:     Rate and Rhythm: Normal rate and regular rhythm.     Pulses: Normal pulses.     Heart sounds: Normal heart sounds. No murmur. No friction rub. No gallop.   Pulmonary:     Effort: Pulmonary effort is normal. No  respiratory distress.     Breath sounds: Normal breath sounds. No stridor. No wheezing, rhonchi or rales.  Chest:     Chest wall: No tenderness.  Musculoskeletal: Normal range of motion.  Skin:    General: Skin is warm and dry.     Capillary Refill: Capillary refill takes less than 2 seconds.     Coloration: Skin is not jaundiced or pale.     Findings: No bruising, erythema, lesion or rash.  Neurological:     General: No focal deficit present.     Mental Status: He is alert and oriented to person, place, and time. Mental status is at baseline.  Psychiatric:        Mood and Affect: Mood normal.        Behavior: Behavior normal.        Thought Content: Thought content normal.        Judgment: Judgment normal.     Results for orders placed or performed in visit on 01/15/18  Bayer DCA Hb A1c Waived  Result Value Ref Range   HB A1C (BAYER DCA - WAIVED) 5.6 <7.0 %  CBC with Differential/Platelet  Result Value Ref Range   WBC 9.7 3.4 - 10.8 x10E3/uL   RBC 4.59 4.14 - 5.80 x10E6/uL   Hemoglobin 14.9 13.0 - 17.7 g/dL   Hematocrit 43.2 37.5 - 51.0 %   MCV 94 79 - 97 fL   MCH 32.5 26.6 - 33.0 pg   MCHC 34.5 31.5 - 35.7 g/dL   RDW 14.2 12.3 - 15.4 %   Platelets 307 150 - 379 x10E3/uL   Neutrophils 54 Not Estab. %   Lymphs 32 Not Estab. %   Monocytes 10 Not Estab. %   Eos 4 Not Estab. %   Basos 0 Not Estab. %   Neutrophils Absolute 5.2 1.4 - 7.0 x10E3/uL   Lymphocytes Absolute 3.1 0.7 - 3.1 x10E3/uL   Monocytes Absolute 0.9 0.1 - 0.9 x10E3/uL   EOS (ABSOLUTE) 0.4 0.0 - 0.4 x10E3/uL   Basophils Absolute 0.0 0.0 - 0.2 x10E3/uL   Immature Granulocytes 0 Not Estab. %   Immature Grans (Abs) 0.0 0.0 - 0.1 x10E3/uL  Comprehensive metabolic panel  Result Value Ref Range   Glucose 92 65 - 99 mg/dL   BUN 17 8 - 27 mg/dL   Creatinine, Ser 1.32 (H) 0.76 - 1.27 mg/dL   GFR calc non Af Amer 57 (L) >59 mL/min/1.73   GFR calc Af Amer 66 >59 mL/min/1.73   BUN/Creatinine Ratio 13 10 - 24    Sodium 142 134 - 144 mmol/L   Potassium 4.5 3.5 - 5.2 mmol/L   Chloride 107 (H) 96 - 106 mmol/L   CO2 20 20 - 29 mmol/L   Calcium 9.6 8.6 - 10.2 mg/dL   Total Protein 7.6 6.0 - 8.5 g/dL   Albumin 4.2 3.6 - 4.8 g/dL   Globulin, Total 3.4 1.5 - 4.5 g/dL   Albumin/Globulin Ratio 1.2 1.2 -  2.2   Bilirubin Total 0.2 0.0 - 1.2 mg/dL   Alkaline Phosphatase 49 39 - 117 IU/L   AST 25 0 - 40 IU/L   ALT 26 0 - 44 IU/L  Lipid Panel w/o Chol/HDL Ratio  Result Value Ref Range   Cholesterol, Total 167 100 - 199 mg/dL   Triglycerides 383 (H) 0 - 149 mg/dL   HDL 40 >39 mg/dL   VLDL Cholesterol Cal 77 (H) 5 - 40 mg/dL   LDL Calculated 50 0 - 99 mg/dL  UA/M w/rflx Culture, Routine  Result Value Ref Range   Specific Gravity, UA 1.020 1.005 - 1.030   pH, UA 6.0 5.0 - 7.5   Color, UA Yellow Yellow   Appearance Ur Clear Clear   Leukocytes, UA Negative Negative   Protein, UA Negative Negative/Trace   Glucose, UA Negative Negative   Ketones, UA Negative Negative   RBC, UA Negative Negative   Bilirubin, UA Negative Negative   Urobilinogen, Ur 0.2 0.2 - 1.0 mg/dL   Nitrite, UA Negative Negative      Assessment & Plan:   Problem List Items Addressed This Visit      Cardiovascular and Mediastinum   Hypertension    Better on recheck. Continue current regimen. Continue to monitor. Call with any concerns.       Relevant Medications   lisinopril (PRINIVIL,ZESTRIL) 5 MG tablet   rosuvastatin (CRESTOR) 10 MG tablet   sildenafil (VIAGRA) 100 MG tablet   Other Relevant Orders   CBC with Differential/Platelet   Comprehensive metabolic panel   Microalbumin, Urine Waived   TSH   UA/M w/rflx Culture, Routine     Endocrine   IFG (impaired fasting glucose)    Stable with A1c of 6.0. Continue diet and exercise. Call with any concerns.       Relevant Orders   CBC with Differential/Platelet   Bayer DCA Hb A1c Waived   Comprehensive metabolic panel   Microalbumin, Urine Waived   TSH   UA/M w/rflx  Culture, Routine   Hypothyroidism    Rechecking levels today. Call with any concerns. Await results. Treat as needed.       Relevant Orders   CBC with Differential/Platelet   Comprehensive metabolic panel   TSH   UA/M w/rflx Culture, Routine     Genitourinary   BPH with obstruction/lower urinary tract symptoms   Relevant Orders   CBC with Differential/Platelet   Comprehensive metabolic panel   PSA   TSH   UA/M w/rflx Culture, Routine     Other   Schizophrenia ([place])    Follows with psychiatry. Continue to follow with them. Stable. Call with any concerns.       Relevant Orders   CBC with Differential/Platelet   Comprehensive metabolic panel   TSH   UA/M w/rflx Culture, Routine   Hyperlipidemia    Under good control on current regimen. Continue current regimen. Continue to monitor. Call with any concerns. Refills given. Labs checked today.       Relevant Medications   lisinopril (PRINIVIL,ZESTRIL) 5 MG tablet   rosuvastatin (CRESTOR) 10 MG tablet   sildenafil (VIAGRA) 100 MG tablet   Other Relevant Orders   CBC with Differential/Platelet   Comprehensive metabolic panel   Lipid Panel w/o Chol/HDL Ratio   TSH   UA/M w/rflx Culture, Routine    Other Visit Diagnoses    Constipation, unspecified constipation type    -  Primary   Starting to do better. Will treat with miralax.  Call with any concerns.    Relevant Orders   CBC with Differential/Platelet   Comprehensive metabolic panel   TSH   UA/M w/rflx Culture, Routine       Follow up plan: Return in about 6 months (around 04/16/2019) for Physical and wellness.

## 2018-10-16 NOTE — Assessment & Plan Note (Signed)
Stable with A1c of 6.0. Continue diet and exercise. Call with any concerns.

## 2018-10-17 ENCOUNTER — Encounter: Payer: Self-pay | Admitting: Family Medicine

## 2018-10-17 LAB — PSA: Prostate Specific Ag, Serum: 0.6 ng/mL (ref 0.0–4.0)

## 2018-10-17 LAB — CBC WITH DIFFERENTIAL/PLATELET
Basophils Absolute: 0.1 10*3/uL (ref 0.0–0.2)
Basos: 1 %
EOS (ABSOLUTE): 0.3 10*3/uL (ref 0.0–0.4)
Eos: 4 %
Hematocrit: 45.9 % (ref 37.5–51.0)
Hemoglobin: 16.4 g/dL (ref 13.0–17.7)
Immature Grans (Abs): 0 10*3/uL (ref 0.0–0.1)
Immature Granulocytes: 0 %
Lymphocytes Absolute: 4 10*3/uL — ABNORMAL HIGH (ref 0.7–3.1)
Lymphs: 45 %
MCH: 32.3 pg (ref 26.6–33.0)
MCHC: 35.7 g/dL (ref 31.5–35.7)
MCV: 91 fL (ref 79–97)
MONOS ABS: 0.7 10*3/uL (ref 0.1–0.9)
Monocytes: 8 %
Neutrophils Absolute: 3.6 10*3/uL (ref 1.4–7.0)
Neutrophils: 42 %
Platelets: 320 10*3/uL (ref 150–450)
RBC: 5.07 x10E6/uL (ref 4.14–5.80)
RDW: 12.9 % (ref 11.6–15.4)
WBC: 8.7 10*3/uL (ref 3.4–10.8)

## 2018-10-17 LAB — COMPREHENSIVE METABOLIC PANEL
ALBUMIN: 4.2 g/dL (ref 3.8–4.8)
ALK PHOS: 41 IU/L (ref 39–117)
ALT: 49 IU/L — ABNORMAL HIGH (ref 0–44)
AST: 43 IU/L — ABNORMAL HIGH (ref 0–40)
Albumin/Globulin Ratio: 1.4 (ref 1.2–2.2)
BUN/Creatinine Ratio: 15 (ref 10–24)
BUN: 19 mg/dL (ref 8–27)
Bilirubin Total: 0.3 mg/dL (ref 0.0–1.2)
CO2: 20 mmol/L (ref 20–29)
Calcium: 9.3 mg/dL (ref 8.6–10.2)
Chloride: 101 mmol/L (ref 96–106)
Creatinine, Ser: 1.26 mg/dL (ref 0.76–1.27)
GFR calc Af Amer: 70 mL/min/{1.73_m2} (ref >59)
GFR calc non Af Amer: 60 mL/min/{1.73_m2} (ref >59)
Globulin, Total: 3.1 g/dL (ref 1.5–4.5)
Glucose: 114 mg/dL — ABNORMAL HIGH (ref 65–99)
Potassium: 4.4 mmol/L (ref 3.5–5.2)
Sodium: 138 mmol/L (ref 134–144)
Total Protein: 7.3 g/dL (ref 6.0–8.5)

## 2018-10-17 LAB — LIPID PANEL W/O CHOL/HDL RATIO
CHOLESTEROL TOTAL: 176 mg/dL (ref 100–199)
HDL: 39 mg/dL — ABNORMAL LOW (ref >39)
Triglycerides: 406 mg/dL — ABNORMAL HIGH (ref 0–149)

## 2018-10-17 LAB — TSH: TSH: 3.58 u[IU]/mL (ref 0.450–4.500)

## 2018-10-17 MED ORDER — LEVOTHYROXINE SODIUM 50 MCG PO TABS: ORAL_TABLET | ORAL | 3 refills | Status: DC

## 2018-10-19 ENCOUNTER — Ambulatory Visit: Payer: Medicare Other | Admitting: Family Medicine

## 2018-10-22 ENCOUNTER — Telehealth: Payer: Self-pay

## 2018-10-22 NOTE — Telephone Encounter (Signed)
Called and let patient know that his disability parking placard form is ready to be picked up. Patient verbalized understanding.

## 2018-10-30 DIAGNOSIS — L720 Epidermal cyst: Secondary | ICD-10-CM | POA: Diagnosis not present

## 2018-11-06 DIAGNOSIS — L720 Epidermal cyst: Secondary | ICD-10-CM | POA: Diagnosis not present

## 2018-11-14 ENCOUNTER — Telehealth: Payer: Self-pay | Admitting: Family Medicine

## 2018-11-14 NOTE — Telephone Encounter (Signed)
Copied from [place] [phone]. Topic: General - Other >> Nov 14, 2018 11:25 AM Percell Belt A wrote: Reason for CRM:    Pt called in and stated that he is currently taking sildenafil (VIAGRA) 100 MG tablet [[id number]]  and he stated that they are not really working very well. Pt stated that he is getting them from the mail order right now and his boss is [place] them [place].  She would like to switch to Cialis and have it sent to his [place], [place] [phone] (Phone)

## 2018-11-15 MED ORDER — TADALAFIL 20 MG PO TABS: 10.0000 mg | ORAL_TABLET | ORAL | 11 refills | Status: DC | PRN

## 2018-11-26 DIAGNOSIS — R05 Cough: Secondary | ICD-10-CM | POA: Diagnosis not present

## 2018-11-26 DIAGNOSIS — J309 Allergic rhinitis, unspecified: Secondary | ICD-10-CM | POA: Diagnosis not present

## 2019-01-11 ENCOUNTER — Emergency Department
Admission: EM | Admit: 2019-01-11 | Discharge: 2019-01-11 | Disposition: A | Discharge status: Home / Self Care | Payer: Medicare Other | Attending: Emergency Medicine | Admitting: Emergency Medicine

## 2019-01-11 ENCOUNTER — Encounter: Payer: Self-pay | Admitting: *Deleted

## 2019-01-11 ENCOUNTER — Emergency Department: Payer: Medicare Other

## 2019-01-11 ENCOUNTER — Other Ambulatory Visit: Payer: Self-pay

## 2019-01-11 DIAGNOSIS — E785 Hyperlipidemia, unspecified: Secondary | ICD-10-CM | POA: Diagnosis not present

## 2019-01-11 DIAGNOSIS — Z87891 Personal history of nicotine dependence: Secondary | ICD-10-CM | POA: Insufficient documentation

## 2019-01-11 DIAGNOSIS — I10 Essential (primary) hypertension: Secondary | ICD-10-CM | POA: Insufficient documentation

## 2019-01-11 DIAGNOSIS — R0600 Dyspnea, unspecified: Secondary | ICD-10-CM | POA: Diagnosis not present

## 2019-01-11 DIAGNOSIS — Z7982 Long term (current) use of aspirin: Secondary | ICD-10-CM | POA: Insufficient documentation

## 2019-01-11 DIAGNOSIS — R0602 Shortness of breath: Secondary | ICD-10-CM | POA: Diagnosis not present

## 2019-01-11 DIAGNOSIS — R05 Cough: Secondary | ICD-10-CM | POA: Diagnosis present

## 2019-01-11 DIAGNOSIS — T7840XA Allergy, unspecified, initial encounter: Secondary | ICD-10-CM

## 2019-01-11 LAB — CBC WITH DIFFERENTIAL/PLATELET
Abs Immature Granulocytes: 0.03 10*3/uL (ref 0.00–0.07)
Basophils Absolute: 0.1 10*3/uL (ref 0.0–0.1)
Basophils Relative: 1 %
Eosinophils Absolute: 0.5 10*3/uL (ref 0.0–0.5)
Eosinophils Relative: 5 %
HCT: 41.4 % (ref 39.0–52.0)
Hemoglobin: 14.4 g/dL (ref 13.0–17.0)
Immature Granulocytes: 0 %
Lymphocytes Relative: 39 %
Lymphs Abs: 3.5 10*3/uL (ref 0.7–4.0)
MCH: 32.1 pg (ref 26.0–34.0)
MCHC: 34.8 g/dL (ref 30.0–36.0)
MCV: 92.2 fL (ref 80.0–100.0)
Monocytes Absolute: 0.9 10*3/uL (ref 0.1–1.0)
Monocytes Relative: 10 %
Neutro Abs: 4 10*3/uL (ref 1.7–7.7)
Neutrophils Relative %: 45 %
Platelets: 273 10*3/uL (ref 150–400)
RBC: 4.49 MIL/uL (ref 4.22–5.81)
RDW: 13.1 % (ref 11.5–15.5)
WBC: 9 10*3/uL (ref 4.0–10.5)
nRBC: 0 % (ref 0.0–0.2)

## 2019-01-11 LAB — COMPREHENSIVE METABOLIC PANEL
ALT: 32 U/L (ref 0–44)
AST: 35 U/L (ref 15–41)
Albumin: 4 g/dL (ref 3.5–5.0)
Alkaline Phosphatase: 41 U/L (ref 38–126)
Anion gap: 10 (ref 5–15)
BUN: 23 mg/dL (ref 8–23)
CO2: 24 mmol/L (ref 22–32)
Calcium: 9.1 mg/dL (ref 8.9–10.3)
Chloride: 106 mmol/L (ref 98–111)
Creatinine, Ser: 1.3 mg/dL — ABNORMAL HIGH (ref 0.61–1.24)
GFR calc Af Amer: >60 mL/min (ref >60)
GFR calc non Af Amer: 58 mL/min — ABNORMAL LOW (ref >60)
Glucose, Bld: 112 mg/dL — ABNORMAL HIGH (ref 70–99)
Potassium: 4.2 mmol/L (ref 3.5–5.1)
Sodium: 140 mmol/L (ref 135–145)
Total Bilirubin: 0.5 mg/dL (ref 0.3–1.2)
Total Protein: 7.8 g/dL (ref 6.5–8.1)

## 2019-01-11 LAB — TROPONIN I: Troponin I: <0.03 ng/mL (ref <0.03)

## 2019-01-11 LAB — LIPASE, BLOOD: Lipase: 33 U/L (ref 11–51)

## 2019-01-11 MED ORDER — ALBUTEROL SULFATE HFA 108 (90 BASE) MCG/ACT IN AERS: 2.0000 | INHALATION_SPRAY | Freq: Four times a day (QID) | RESPIRATORY_TRACT | 2 refills | Status: DC | PRN

## 2019-01-11 MED ORDER — PREDNISONE 20 MG PO TABS: 40.0000 mg | ORAL_TABLET | Freq: Every day | ORAL | 0 refills | Status: DC

## 2019-01-11 NOTE — ED Notes (Signed)
Peripheral IV discontinued. Catheter intact. No signs of infiltration or redness. Gauze applied to IV site.   Discharge instructions reviewed with patient. Questions fielded by this RN. Patient verbalizes understanding of instructions. Patient discharged home in stable condition per paduchowski. No acute distress noted at time of discharge.   

## 2019-01-11 NOTE — ED Provider Notes (Signed)
[facility] Emergency Department Provider Note  Time seen: 8:48 PM  I have reviewed the triage vital signs and the nursing notes.   HISTORY  Chief Complaint Cough and Shortness of Breath   HPI Mitchell [name] is a 63 y.o. male with a past medical history of anxiety, depression, insomnia, schizophrenia, presents to the emergency department for shortness of breath.  According to the patient over the past several days he has had some shortness of breath and a dry cough at home.  Patient thought it was due to pollen but states he continued to have shortness of breath today he became concerned so he came to the emergency department for evaluation.  Patient denies any fever at any point.  Denies any congestion.  Denies any known sick contacts.  Denies any recent travel.  Patient states since arriving to the emergency department he no longer feels short of breath.   Denies any chest pain.  Past Medical History:  Diagnosis Date  . Anxiety   . Cancer ([place])    skin cancer  . Depression   . Epididymitis   . Hepatitis C    Tested positive, treated now told that he does not have it  . Hydrocele   . Hypertension   . IFG (impaired fasting glucose)   . Insomnia   . Kidney stone   . Schizophrenia [facility])     Patient Active Problem List   Diagnosis Date Noted  . Special screening for malignant neoplasms, colon   . Problems with swallowing and mastication   . Gastritis   . Acromioclavicular joint arthritis 04/13/2016  . Swollen testicle 08/14/2015  . BPH with obstruction/lower urinary tract symptoms 08/14/2015  . Hyperlipidemia 07/22/2015  . Hypothyroidism 07/17/2015  . Scrotal swelling 07/16/2015  . Hydrocele, right 07/08/2015  . Drug-induced erectile dysfunction 07/08/2015  . IFG (impaired fasting glucose)   . Schizophrenia ([place])   . Insomnia   . Anxiety   . Hypertension   . Depression   . Hepatitis C   . Developmental delay 01/02/2013    Past Surgical  History:  Procedure Laterality Date  . ANKLE FRACTURE SURGERY    . COLONOSCOPY  2010  . COLONOSCOPY WITH PROPOFOL  06/07/2016   Procedure: COLONOSCOPY WITH PROPOFOL;  Surgeon: [name], MD;  Location: [facility] ENDOSCOPY;  Service: Endoscopy;;  . ESOPHAGOGASTRODUODENOSCOPY (EGD) WITH PROPOFOL N/A 06/07/2016   Procedure: ESOPHAGOGASTRODUODENOSCOPY (EGD) WITH PROPOFOL;  Surgeon: [name], MD;  Location: [facility] ENDOSCOPY;  Service: Endoscopy;  Laterality: N/A;  . HERNIA REPAIR  2011   X 2    Prior to Admission medications   Medication Sig Start Date End Date Taking? Authorizing Provider  aspirin EC 81 MG tablet Take 1 tablet (81 mg total) by mouth daily. 01/15/18   Johnson, Megan P, DO  clonazePAM (KLONOPIN) 0.5 MG tablet Take 0.5 mg by mouth 3 (three) times daily as needed for anxiety.    [provider]  FLOVENT HFA 220 MCG/ACT inhaler  01/10/17   [provider]  levothyroxine (SYNTHROID, LEVOTHROID) 50 MCG tablet TAKE 1 TABLET BY MOUTH  DAILY BEFORE BREAKFAST 10/17/18   Johnson, Megan P, DO  lisinopril (PRINIVIL,ZESTRIL) 5 MG tablet TAKE 1 AND 1/2 TABLETS BY  MOUTH ONCE DAILY 10/16/18   Johnson, Megan P, DO  loratadine (CLARITIN) 10 MG tablet Take 10 mg by mouth daily.    [provider]  Multiple Vitamin (MULTI-VITAMINS) TABS Take by mouth.    [provider]  polyethylene  glycol powder (GLYCOLAX/MIRALAX) powder Take 17 g by mouth 2 (two) times daily as needed. 10/16/18   Johnson, Megan P, DO  rosuvastatin (CRESTOR) 10 MG tablet Take 1 tablet (10 mg total) by mouth daily. 10/16/18   Johnson, Megan P, DO  tadalafil (ADCIRCA/CIALIS) 20 MG tablet Take 0.5-1 tablets (10-20 mg total) by mouth every other day as needed for erectile dysfunction. 11/15/18   Park Liter P, DO    Allergies  Allergen Reactions  . Haloperidol Decanoate Other (See Comments)    Muscle pain  . Atorvastatin Other (See Comments)    myalgias  . Codeine Nausea And Vomiting    Family  History  Problem Relation Age of Onset  . Diabetes Mother   . Alzheimer's disease Father   . Diabetes Brother   . Diabetes Maternal Grandfather     Social History Social History   Tobacco Use  . Smoking status: Former Smoker    Last attempt to quit: 07/06/1995    Years since quitting: 23.5  . Smokeless tobacco: Former Network engineer Use Topics  . Alcohol use: Yes    Alcohol/week: 1.0 standard drinks    Types: 1 Cans of beer per week    Comment: remote history, quit 20+ years ago (1 beer q 2-3 days)  . Drug use: No    Comment: remote history, quit 20= years ago    Review of Systems Constitutional: Negative for fever. ENT: Negative for recent illness/congestion Cardiovascular: Negative for chest pain. Respiratory: Mild shortness of breath some several days.  Dry cough. Gastrointestinal: Negative for abdominal pain, vomiting  Musculoskeletal: Negative for musculoskeletal complaints Skin: Negative for skin complaints  Neurological: Negative for headache All other ROS negative  ____________________________________________   PHYSICAL EXAM:  VITAL SIGNS: ED Triage Vitals  Enc Vitals Group     BP 01/11/19 1953 (!) 147/88     Pulse Rate 01/11/19 1953 84     Resp 01/11/19 1953 (!) 24     Temp 01/11/19 1953 98.3 F (36.8 C)     Temp Source 01/11/19 1953 Oral     SpO2 01/11/19 1953 95 %     Weight 01/11/19 1957 165 lb (74.8 kg)     Height 01/11/19 1957 5\' 3"  (1.6 m)     Head Circumference --      Peak Flow --      Pain Score 01/11/19 1955 5     Pain Loc --      Pain Edu? --      Excl. in [place]? --     Constitutional: Alert and oriented. Well appearing and in no distress. Eyes: Normal exam ENT      Head: Normocephalic and atraumatic      Mouth/Throat: Mucous membranes are moist. Cardiovascular: Normal rate, regular rhythm. Respiratory: Normal respiratory effort without tachypnea nor retractions. Breath sounds are clear Gastrointestinal: Soft and nontender. No  distention.   Musculoskeletal: Nontender with normal range of motion in all extremities. Neurologic:  Normal speech and language. No gross focal neurologic deficits  Skin:  Skin is warm, dry and intact.  Psychiatric: Mood and affect are normal.   ____________________________________________    EKG  EKG viewed and interpreted by myself shows a sinus rhythm at 79 bpm with a narrow QRS, normal axis, normal intervals, nonspecific ST changes without ST elevation.  ____________________________________________    RADIOLOGY  Chest x-ray negative  ____________________________________________   INITIAL IMPRESSION / ASSESSMENT AND PLAN / ED COURSE  Pertinent labs & imaging results  that were available during my care of the patient were reviewed by me and considered in my medical decision making (see chart for details).   Patient presents emergency department for shortness of breath ongoing over the past few days, worse today per patient.  States he felt he was wheezing at home, although he currently states he feels well without any trouble breathing at all.  Patient states he was concerned that it could be coronavirus.  Patient denies any fever, no known sick contacts, no travel.  Very low suspicion for coronavirus.  Patient is afebrile in the emergency department.  We will check labs, chest x-ray and continue to closely monitor.  Patient agreeable to plan of care.  Differential would include reactive airway disease, allergies, COPD (remote smoking history), upper respiratory infection, pneumonia.  Patient's work-up is reassuring.  We will discharge with a short course of prednisone and albuterol inhaler to be used as needed.  Patient agreeable to plan of care.  Mitchell [name] was evaluated in Emergency Department on 01/11/2019 for the symptoms described in the history of present illness. He was evaluated in the context of the global COVID-19 pandemic, which necessitated consideration that the  patient might be at risk for infection with the SARS-CoV-2 virus that causes COVID-19. Institutional protocols and algorithms that pertain to the evaluation of patients at risk for COVID-19 are in a state of rapid change based on information released by regulatory bodies including the CDC and federal and state organizations. These policies and algorithms were followed during the patient's care in the ED.  ____________________________________________   FINAL CLINICAL IMPRESSION(S) / ED DIAGNOSES  Dyspnea   Harvest Dark, MD 01/11/19 2254

## 2019-01-11 NOTE — ED Triage Notes (Signed)
Pt c/o cough and shortness of breath x 1 week. Pt c/o burning in throat from acid reflux. Pt denies chest pain. Pt denies fever and n/v/d. Pt denies exposure to Covid 19.

## 2019-01-19 ENCOUNTER — Other Ambulatory Visit: Payer: Self-pay

## 2019-01-19 ENCOUNTER — Emergency Department: Payer: Medicare Other

## 2019-01-19 ENCOUNTER — Emergency Department
Admission: EM | Admit: 2019-01-19 | Discharge: 2019-01-19 | Disposition: A | Discharge status: Home / Self Care | Payer: Medicare Other | Attending: Emergency Medicine | Admitting: Emergency Medicine

## 2019-01-19 DIAGNOSIS — R0602 Shortness of breath: Secondary | ICD-10-CM | POA: Diagnosis not present

## 2019-01-19 DIAGNOSIS — Z85828 Personal history of other malignant neoplasm of skin: Secondary | ICD-10-CM | POA: Diagnosis not present

## 2019-01-19 DIAGNOSIS — F419 Anxiety disorder, unspecified: Secondary | ICD-10-CM | POA: Insufficient documentation

## 2019-01-19 DIAGNOSIS — Z79899 Other long term (current) drug therapy: Secondary | ICD-10-CM | POA: Insufficient documentation

## 2019-01-19 DIAGNOSIS — Z7982 Long term (current) use of aspirin: Secondary | ICD-10-CM | POA: Insufficient documentation

## 2019-01-19 DIAGNOSIS — I10 Essential (primary) hypertension: Secondary | ICD-10-CM | POA: Insufficient documentation

## 2019-01-19 DIAGNOSIS — E039 Hypothyroidism, unspecified: Secondary | ICD-10-CM | POA: Diagnosis not present

## 2019-01-19 DIAGNOSIS — Z87891 Personal history of nicotine dependence: Secondary | ICD-10-CM | POA: Diagnosis not present

## 2019-01-19 DIAGNOSIS — R059 Cough, unspecified: Secondary | ICD-10-CM

## 2019-01-19 DIAGNOSIS — Z20828 Contact with and (suspected) exposure to other viral communicable diseases: Secondary | ICD-10-CM | POA: Insufficient documentation

## 2019-01-19 DIAGNOSIS — R05 Cough: Secondary | ICD-10-CM | POA: Diagnosis not present

## 2019-01-19 DIAGNOSIS — J302 Other seasonal allergic rhinitis: Secondary | ICD-10-CM

## 2019-01-19 MED ORDER — BENZONATATE 100 MG PO CAPS: ORAL_CAPSULE | ORAL | 0 refills | Status: DC

## 2019-01-19 NOTE — ED Triage Notes (Signed)
Patient reports continued dry cough and shortness of breath.  Seen for same on 4/24.

## 2019-01-19 NOTE — Discharge Instructions (Signed)
Your exam and chest XR are normal. You should continue to use your inhaler, 3-4 times a day. Take the prescription cough medicine as directed. You will be notified by phone of your COVID test results. Follow-up with your provider by phone, before you present to the ED with continued symptoms.

## 2019-01-19 NOTE — ED Notes (Signed)
Pt dx with allergies 2 weeks ago. Finished meds and states still having dry cough. Has not followed up with pcp. Non-febrile, nad.

## 2019-01-19 NOTE — ED Provider Notes (Signed)
[facility] Emergency Department Provider Note ____________________________________________  Time seen: 2035  I have reviewed the triage vital signs and the nursing notes.  HISTORY  Chief Complaint  Cough and Shortness of Breath  HPI Mitchell [name] is a 63 y.o. male with a history of HTN, anxiety, and schizophrenia presents himself to the ED for evaluation of intermittent cough and shortness of breath.  Patient was seen here recently for similar symptoms, and was discharged with a diagnosis of seasonal allergies.  He has been using the albuterol inhaler about twice a day.  He is also been taking cetirizine as directed.  Denies any high risk exposure, recent travel, or sick contacts.  He does voice some concern for COVID-19, but this is just due to his anxiety over possible diagnosis.  He denies any fevers, chills, or sweats.  Also denies any nausea, vomiting, abdominal pain, or weakness.   Past Medical History:  Diagnosis Date  . Anxiety   . Cancer ([place])    skin cancer  . Depression   . Epididymitis   . Hepatitis C    Tested positive, treated now told that he does not have it  . Hydrocele   . Hypertension   . IFG (impaired fasting glucose)   . Insomnia   . Kidney stone   . Schizophrenia [facility])     Patient Active Problem List   Diagnosis Date Noted  . Special screening for malignant neoplasms, colon   . Problems with swallowing and mastication   . Gastritis   . Acromioclavicular joint arthritis 04/13/2016  . Swollen testicle 08/14/2015  . BPH with obstruction/lower urinary tract symptoms 08/14/2015  . Hyperlipidemia 07/22/2015  . Hypothyroidism 07/17/2015  . Scrotal swelling 07/16/2015  . Hydrocele, right 07/08/2015  . Drug-induced erectile dysfunction 07/08/2015  . IFG (impaired fasting glucose)   . Schizophrenia ([place])   . Insomnia   . Anxiety   . Hypertension   . Depression   . Hepatitis C   . Developmental delay 01/02/2013    Past Surgical  History:  Procedure Laterality Date  . ANKLE FRACTURE SURGERY    . COLONOSCOPY  2010  . COLONOSCOPY WITH PROPOFOL  06/07/2016   Procedure: COLONOSCOPY WITH PROPOFOL;  Surgeon: [name], MD;  Location: [facility] ENDOSCOPY;  Service: Endoscopy;;  . ESOPHAGOGASTRODUODENOSCOPY (EGD) WITH PROPOFOL N/A 06/07/2016   Procedure: ESOPHAGOGASTRODUODENOSCOPY (EGD) WITH PROPOFOL;  Surgeon: [name], MD;  Location: [facility] ENDOSCOPY;  Service: Endoscopy;  Laterality: N/A;  . HERNIA REPAIR  2011   X 2    Prior to Admission medications   Medication Sig Start Date End Date Taking? Authorizing Provider  albuterol (VENTOLIN HFA) 108 (90 Base) MCG/ACT inhaler Inhale 2 puffs into the lungs every 6 (six) hours as needed for wheezing or shortness of breath. 01/11/19   Harvest Dark, MD  aspirin EC 81 MG tablet Take 1 tablet (81 mg total) by mouth daily. 01/15/18   Johnson, Megan P, DO  clonazePAM (KLONOPIN) 0.5 MG tablet Take 0.5 mg by mouth 3 (three) times daily as needed for anxiety.    [provider]  FLOVENT HFA 220 MCG/ACT inhaler  01/10/17   [provider]  levothyroxine (SYNTHROID, LEVOTHROID) 50 MCG tablet TAKE 1 TABLET BY MOUTH  DAILY BEFORE BREAKFAST 10/17/18   Johnson, Megan P, DO  lisinopril (PRINIVIL,ZESTRIL) 5 MG tablet TAKE 1 AND 1/2 TABLETS BY  MOUTH ONCE DAILY 10/16/18   Johnson, Megan P, DO  loratadine (CLARITIN) 10 MG tablet Take 10 mg  by mouth daily.    [provider]  Multiple Vitamin (MULTI-VITAMINS) TABS Take by mouth.    [provider]  polyethylene glycol powder (GLYCOLAX/MIRALAX) powder Take 17 g by mouth 2 (two) times daily as needed. 10/16/18   Johnson, Megan P, DO  predniSONE (DELTASONE) 20 MG tablet Take 2 tablets (40 mg total) by mouth daily. 01/11/19   Harvest Dark, MD  rosuvastatin (CRESTOR) 10 MG tablet Take 1 tablet (10 mg total) by mouth daily. 10/16/18   Johnson, Megan P, DO  tadalafil (ADCIRCA/CIALIS) 20 MG tablet Take 0.5-1 tablets  (10-20 mg total) by mouth every other day as needed for erectile dysfunction. 11/15/18   Park Liter P, DO    Allergies Haloperidol decanoate; Atorvastatin; and Codeine  Family History  Problem Relation Age of Onset  . Diabetes Mother   . Alzheimer's disease Father   . Diabetes Brother   . Diabetes Maternal Grandfather     Social History Social History   Tobacco Use  . Smoking status: Former Smoker    Last attempt to quit: 07/06/1995    Years since quitting: 23.5  . Smokeless tobacco: Former Network engineer Use Topics  . Alcohol use: Yes    Alcohol/week: 1.0 standard drinks    Types: 1 Cans of beer per week    Comment: remote history, quit 20+ years ago (1 beer q 2-3 days)  . Drug use: No    Comment: remote history, quit 20= years ago    Review of Systems  Constitutional: Negative for fever. Eyes: Negative for visual changes. ENT: Negative for sore throat. Cardiovascular: Negative for chest pain. Respiratory: Positive for shortness of breath. Gastrointestinal: Negative for abdominal pain, vomiting and diarrhea. Genitourinary: Negative for dysuria. Musculoskeletal: Negative for back pain. Skin: Negative for rash. Neurological: Negative for headaches, focal weakness or numbness. ____________________________________________  PHYSICAL EXAM:  VITAL SIGNS: ED Triage Vitals [01/19/19 2026]  Enc Vitals Group     BP (!) 149/69     Pulse Rate 85     Resp 18     Temp 98.4 F (36.9 C)     Temp Source Oral     SpO2 95 %     Weight      Height      Head Circumference      Peak Flow      Pain Score 5     Pain Loc      Pain Edu?      Excl. in [place]?     Constitutional: Alert and oriented. Well appearing and in no distress. Head: Normocephalic and atraumatic. Eyes: Conjunctivae are normal. Normal extraocular movements Ears: Canals clear. TMs intact bilaterally. Nose: No congestion/rhinorrhea/epistaxis. Mouth/Throat: Mucous membranes are moist. Cardiovascular:  Normal rate, regular rhythm. Normal distal pulses. Respiratory: Normal respiratory effort. No wheezes/rales/rhonchi. Gastrointestinal: Soft and nontender. No distention. Musculoskeletal: Nontender with normal range of motion in all extremities.  Neurologic:  Normal gait without ataxia. Normal speech and language. No gross focal neurologic deficits are appreciated. Skin:  Skin is warm, dry and intact. No rash noted. Psychiatric: Mood is anxious and affect are normal. Patient exhibits appropriate insight and judgment. ____________________________________________   LABS (pertinent positives/negatives) Labs Reviewed  NOVEL CORONAVIRUS, NAA (HOSPITAL ORDER, SEND-OUT TO REF LAB)  ____________________________________________   RADIOLOGY  CXR  ____________________________________________  PROCEDURES  Procedures ____________________________________________  INITIAL IMPRESSION / ASSESSMENT AND PLAN / ED COURSE  Mitchell [name] was evaluated in Emergency Department on 01/19/2019 for the symptoms described in the  history of present illness. He was evaluated in the context of the global COVID-19 pandemic, which necessitated consideration that the patient might be at risk for infection with the SARS-CoV-2 virus that causes COVID-19. Institutional protocols and algorithms that pertain to the evaluation of patients at risk for COVID-19 are in a state of rapid change based on information released by regulatory bodies including the CDC and federal and state organizations. These policies and algorithms were followed during the patient's care in the ED.  Patient with exam is overall benign and his CXR is negative.  Vital signs are stable and he is in no acute respiratory distress, does not have a toxic appearance, and does not endorse any high-risk exposure. He is being tested for COVID-19. He will discharge home with Ladona Ridgel and instructions to continue his  inhaler. ____________________________________________  FINAL CLINICAL IMPRESSION(S) / ED DIAGNOSES  Final diagnoses:  Seasonal allergies  Cough      A[place]a Estey, Dannielle Karvonen, PA-C 01/19/19 2114    Rudene Re, MD 01/19/19 2245

## 2019-01-21 ENCOUNTER — Encounter: Payer: Medicare Other | Admitting: Family Medicine

## 2019-01-21 ENCOUNTER — Other Ambulatory Visit: Payer: Self-pay

## 2019-01-21 ENCOUNTER — Ambulatory Visit: Payer: Self-pay

## 2019-01-21 ENCOUNTER — Ambulatory Visit (INDEPENDENT_AMBULATORY_CARE_PROVIDER_SITE_OTHER): Payer: Medicare Other

## 2019-01-21 VITALS — Ht 65.0 in | Wt 165.0 lb

## 2019-01-21 DIAGNOSIS — Z0000 Encounter for general adult medical examination without abnormal findings: Secondary | ICD-10-CM

## 2019-01-21 LAB — NOVEL CORONAVIRUS, NAA (HOSP ORDER, SEND-OUT TO REF LAB; TAT 18-24 HRS): SARS-CoV-2, NAA: NOT DETECTED

## 2019-01-21 NOTE — Progress Notes (Signed)
Subjective:   Mitchell [name] is a 63 y.o. male who presents for Medicare Annual/Subsequent preventive examination.  This visit is being conducted via phone call  - after an attmept to do on video chat - due to the COVID-19 pandemic. This patient has given me verbal consent via phone to conduct this visit, patient states they are participating from their home address. Some vital signs may be absent or patient reported.   Patient identification: identified by name, DOB, and current address.    Review of Systems:   Cardiac Risk Factors include: advanced age (>63mn, >>38women);male gender;hypertension;dyslipidemia     Objective:    Vitals: Ht _0  (1.651 m) Comment: patient reported  Wt 165 lb (74.8 kg) Comment: patient reported  BMI 27.46 kg/m   Body mass index is 27.46 kg/m.  Advanced Directives 01/21/2019 01/19/2019 01/11/2019 10/12/2018 01/12/2017 01/06/2017 07/21/2016  Does Patient Have a Medical Advance Directive? Yes _1  No  Type of Advance Directive Living will;Healthcare Power of Attorney - - - - - -  Copy of HFaxonin Chart? No - copy requested - - - - - -  Would patient like information on creating a medical advance directive? - - - - Yes (MAU/Ambulatory/Procedural Areas - Information given) No - Patient declined -    Tobacco Social History   Tobacco Use  Smoking Status Former Smoker  . Last attempt to quit: 07/06/1995  . Years since quitting: 23.5  Smokeless Tobacco Former UEngineer, structuralgiven: Not Answered   Clinical Intake:  Pre-visit preparation completed: No  Pain : No/denies pain Pain Score: 0-No pain     Diabetes: No  How often do you need to have someone help you when you read instructions, pamphlets, or other written materials from your doctor or pharmacy?: 1 - Never What is the last grade level you completed in school?: 8th grade  Interpreter Needed?: No  Information entered by :: Kavion Mancinas<LPN  Past  Medical History:  Diagnosis Date  . Anxiety   . [place] cancer  . Depression   . Epididymitis   . Hepatitis C    Tested positive, treated now told that he does not have it  . Hydrocele   . Hypertension   . IFG (impaired fasting glucose)   . Insomnia   . Kidney stone   . Schizophrenia ([facility]    Past Surgical History:  Procedure Laterality Date  . ANKLE FRACTURE SURGERY    . COLONOSCOPY  2010  . COLONOSCOPY WITH PROPOFOL  06/07/2016   Procedure: COLONOSCOPY WITH PROPOFOL;  Surgeon: DLucilla Lame MD;  Location: [facility] ENDOSCOPY;  Service: Endoscopy;;  . ESOPHAGOGASTRODUODENOSCOPY (EGD) WITH PROPOFOL N/A 06/07/2016   Procedure: ESOPHAGOGASTRODUODENOSCOPY (EGD) WITH PROPOFOL;  Surgeon: DLucilla Lame MD;  Location: [facility] ENDOSCOPY;  Service: Endoscopy;  Laterality: N/A;  . HERNIA REPAIR  2011   X 2   Family History  Problem Relation Age of Onset  . Diabetes Mother   . Alzheimer's disease Father   . Diabetes Brother   . Diabetes Maternal Grandfather    Social History   Socioeconomic History  . Marital status: Married    Spouse name: Not on file  . Number of children: Not on file  . Years of education: Not on file  . Highest education level: Not on file  Occupational History  . Occupation: retired   SScientific laboratory technician . Financial resource strain: Not  hard at all  . Food insecurity:    Worry: Never true    Inability: Never true  . Transportation needs:    Medical: No    Non-medical: No  Tobacco Use  . Smoking status: Former Smoker    Last attempt to quit: 07/06/1995    Years since quitting: 23.5  . Smokeless tobacco: Former Network engineer and Sexual Activity  . Alcohol use: Yes    Alcohol/week: 1.0 standard drinks    Types: 1 Cans of beer per week    Comment: remote history, quit 20+ years ago (1 beer q 2-3 days)  . Drug use: No    Comment: remote history, quit 20= years ago  . Sexual activity: Yes  Lifestyle  . Physical activity:    Days per week: 0 days     Minutes per session: 0 min  . Stress: Not at all  Relationships  . Social connections:    Talks on phone: More than three times a week    Gets together: More than three times a week    Attends religious service: More than 4 times per year    Active member of club or organization: No    Attends meetings of clubs or organizations: Never    Relationship status: Married  Other Topics Concern  . Not on file  Social History Narrative  . Not on file    Outpatient Encounter Medications as of 01/21/2019  Medication Sig  . albuterol (VENTOLIN HFA) 108 (90 Base) MCG/ACT inhaler Inhale 2 puffs into the lungs every 6 (six) hours as needed for wheezing or shortness of breath.  Marland Kitchen aspirin EC 81 MG tablet Take 1 tablet (81 mg total) by mouth daily.  . benzonatate (TESSALON PERLES) 100 MG capsule Take 1-2 tabs TID prn cough  . clonazePAM (KLONOPIN) 0.5 MG tablet Take 0.5 mg by mouth 3 (three) times daily as needed for anxiety.  Marland Kitchen FLOVENT HFA 220 MCG/ACT inhaler   . levothyroxine (SYNTHROID, LEVOTHROID) 50 MCG tablet TAKE 1 TABLET BY MOUTH  DAILY BEFORE BREAKFAST  . lisinopril (PRINIVIL,ZESTRIL) 5 MG tablet TAKE 1 AND 1/2 TABLETS BY  MOUTH ONCE DAILY  . Multiple Vitamin (MULTI-VITAMINS) TABS Take by mouth.  . polyethylene glycol powder (GLYCOLAX/MIRALAX) powder Take 17 g by mouth 2 (two) times daily as needed.  . rosuvastatin (CRESTOR) 10 MG tablet Take 1 tablet (10 mg total) by mouth daily.  . tadalafil (ADCIRCA/CIALIS) 20 MG tablet Take 0.5-1 tablets (10-20 mg total) by mouth every other day as needed for erectile dysfunction.  Marland Kitchen loratadine (CLARITIN) 10 MG tablet Take 10 mg by mouth daily.  . [DISCONTINUED] predniSONE (DELTASONE) 20 MG tablet Take 2 tablets (40 mg total) by mouth daily. (Patient not taking: Reported on 01/21/2019)   No facility-administered encounter medications on file as of 01/21/2019.     Activities of Daily Living In your present state of health, do you have any difficulty  performing the following activities: 01/21/2019  Hearing? N  Vision? N  Difficulty concentrating or making decisions? N  Walking or climbing stairs? N  Dressing or bathing? N  Doing errands, shopping? N  Preparing Food and eating ? N  Using the Toilet? N  In the past six months, have you accidently leaked urine? N  Do you have problems with loss of bowel control? N  Managing your Medications? N  Managing your Finances? N  Housekeeping or managing your Housekeeping? N  Some recent data might be hidden  Patient Care Team: Valerie Roys, DO as PCP - General (Family Medicine)   Assessment:   This is a routine wellness examination for Param.  Exercise Activities and Dietary recommendations Current Exercise Habits: Home exercise routine, Intensity: Mild, Exercise limited by: None identified  Goals    . DIET - INCREASE WATER INTAKE     Recommend drinking at least 6-8 glasses of water a day        Fall Risk: Fall Risk  01/21/2019 01/15/2018 07/18/2017 01/12/2017 10/13/2016  Falls in the past year? 0 No No No No    FALL RISK PREVENTION PERTAINING TO THE HOME:  Any stairs in or around the home? No  If so, are there any without handrails? n/a  Home free of loose throw rugs in walkways, pet beds, electrical cords, etc? Yes  Adequate lighting in your home to reduce risk of falls? Yes   ASSISTIVE DEVICES UTILIZED TO PREVENT FALLS:  Life alert? No  Use of a cane, walker or w/c? Cane  Grab bars in the bathroom? No  Shower chair or bench in shower? No  Elevated toilet seat or a handicapped toilet? No   TIMED UP AND GO:  Unable to perform   Depression Screen PHQ 2/9 Scores 01/21/2019 01/15/2018 01/15/2018 07/18/2017  PHQ - 2 Score 0 0 0 0  PHQ- 9 Score - 0 - -    Cognitive Function     6CIT Screen 01/21/2019 01/12/2017  What Year? 0 points 0 points  What month? 0 points 0 points  What time? 0 points 0 points  Count back from 20 0 points 0 points  Months in reverse 0 points  0 points  Repeat phrase 0 points 4 points  Total Score 0 4    Immunization History  Administered Date(s) Administered  . Influenza,inj,Quad PF,6+ Mos 06/08/2015, 06/27/2016, 07/18/2017, 07/03/2018  . MMR 01/25/1995  . Pneumococcal Polysaccharide-23 05/17/2013  . Td 07/27/2018  . Zoster 06/24/2015    Qualifies for Shingles Vaccine? Yes  Zostavax completed 06/24/2015. Due for Shingrix. Education has been provided regarding the importance of this vaccine. Pt has been advised to call insurance company to determine out of pocket expense. Advised may also receive vaccine at local pharmacy or Health Dept. Verbalized acceptance and understanding.  Tdap: up to date   Flu Vaccine: up to date   Pneumococcal Vaccine: up to date    Screening Tests Health Maintenance  Topic Date Due  . INFLUENZA VACCINE  04/20/2019  . COLONOSCOPY  06/07/2026  . TETANUS/TDAP  07/27/2028  . Hepatitis C Screening  Completed  . HIV Screening  Addressed   Cancer Screenings:  Colorectal Screening: Completed 06/07/2016. Repeat every 10 years  Lung Cancer Screening: (Low Dose CT Chest recommended if Age 45-80 years, 30 pack-year currently smoking OR have quit w/in 15years.) does not qualify.     Additional Screening:  Hepatitis C Screening: does qualify; Completed 01/12/2017  Vision Screening: Recommended annual ophthalmology exams for early detection of glaucoma and other disorders of the eye. Is the patient up to date with their annual eye exam?  Yes  Who is the provider or what is the name of the office in which the pt attends annual eye exams? [facility]   Dental Screening: Recommended annual dental exams for proper oral hygiene  Community Resource Referral:  CRR required this visit?  No        Plan:  I have personally reviewed and addressed the Medicare Annual Wellness questionnaire and have noted the  following in the patient's chart:  A. Medical and social history B. Use of alcohol, tobacco or  illicit drugs  C. Current medications and supplements D. Functional ability and status E.  Nutritional status F.  Physical activity G. Advance directives H. List of other physicians I.  Hospitalizations, surgeries, and ER visits in previous 12 months J.  [place] such as hearing and vision if needed, cognitive and depression L. Referrals and appointments   In addition, I have reviewed and discussed with patient certain preventive protocols, quality metrics, and best practice recommendations. A written personalized care plan for preventive services as well as general preventive health recommendations were provided to patient via mail   Signed,   Bevelyn Ngo, LPN  12/23/9975 Nurse Health Advisor   Nurse Notes: none

## 2019-01-21 NOTE — Patient Instructions (Signed)
Mitchell [name] , Thank you for taking time to come for your Medicare Wellness Visit. I appreciate your ongoing commitment to your health goals. Please review the following plan we discussed and let me know if I can assist you in the future.   Screening recommendations/referrals: Colonoscopy: completed 06/07/2016   Recommended yearly ophthalmology/optometry visit for glaucoma screening and checkup Recommended yearly dental visit for hygiene and checkup  Vaccinations: Influenza vaccine: up to date Pneumococcal vaccine: due at age 68 Tdap vaccine: up to date- completed 07/27/2018 Shingles vaccine: shingrix eligible, check with your insurance/pharmacy for coverage information  Advanced directives: Please bring a copy of your health care power of attorney and living will to the office at your convenience.  Conditions/risks identified: none  Next appointment:  Follow up in one year for your annual wellness exam.   Preventive Care 40-64 Years, Male Preventive care refers to lifestyle choices and visits with your health care provider that can promote health and wellness. What does preventive care include?  A yearly physical exam. This is also called an annual well check.  Dental exams once or twice a year.  Routine eye exams. Ask your health care provider how often you should have your eyes checked.  Personal lifestyle choices, including:  Daily care of your teeth and gums.  Regular physical activity.  Eating a healthy diet.  Avoiding tobacco and drug use.  Limiting alcohol use.  Practicing safe sex.  Taking low-dose aspirin every day starting at age 7. What happens during an annual well check? The services and screenings done by your health care provider during your annual well check will depend on your age, overall health, lifestyle risk factors, and family history of disease. Counseling  Your health care provider may ask you questions about your:  Alcohol use.  Tobacco use.   Drug use.  Emotional well-being.  Home and relationship well-being.  Sexual activity.  Eating habits.  Work and work Statistician. Screening  You may have the following tests or measurements:  Height, weight, and BMI.  Blood pressure.  Lipid and cholesterol levels. These may be checked every 5 years, or more frequently if you are over 76 years old.  Skin check.  Lung cancer screening. You may have this screening every year starting at age 44 if you have a 30-pack-year history of smoking and currently smoke or have quit within the past 15 years.  Fecal occult blood test (FOBT) of the stool. You may have this test every year starting at age 65.  Flexible sigmoidoscopy or colonoscopy. You may have a sigmoidoscopy every 5 years or a colonoscopy every 10 years starting at age 47.  Prostate cancer screening. Recommendations will vary depending on your family history and other risks.  Hepatitis C blood test.  Hepatitis B blood test.  Sexually transmitted disease (STD) testing.  Diabetes screening. This is done by checking your blood sugar (glucose) after you have not eaten for a while (fasting). You may have this done every 1-3 years. Discuss your test results, treatment options, and if necessary, the need for more tests with your health care provider. Vaccines  Your health care provider may recommend certain vaccines, such as:  Influenza vaccine. This is recommended every year.  Tetanus, diphtheria, and acellular pertussis (Tdap, Td) vaccine. You may need a Td booster every 10 years.  Zoster vaccine. You may need this after age 70.  Pneumococcal 13-valent conjugate (PCV13) vaccine. You may need this if you have certain conditions and have not been  vaccinated.  Pneumococcal polysaccharide (PPSV23) vaccine. You may need one or two doses if you smoke cigarettes or if you have certain conditions. Talk to your health care provider about which screenings and vaccines you need  and how often you need them. This information is not intended to replace advice given to you by your health care provider. Make sure you discuss any questions you have with your health care provider. Document Released: 10/02/2015 Document Revised: 05/25/2016 Document Reviewed: 07/07/2015 Elsevier Interactive Patient Education  2017 [employer] Prevention in the Home Falls can cause injuries. They can happen to people of all ages. There are many things you can do to make your home safe and to help prevent falls. What can I do on the outside of my home?  Regularly fix the edges of walkways and driveways and fix any cracks.  Remove anything that might make you trip as you walk through a door, such as a raised step or threshold.  Trim any bushes or trees on the path to your home.  Use bright outdoor lighting.  Clear any walking paths of anything that might make someone trip, such as rocks or tools.  Regularly check to see if handrails are loose or broken. Make sure that both sides of any steps have handrails.  Any raised decks and porches should have guardrails on the edges.  Have any leaves, snow, or ice cleared regularly.  Use sand or salt on walking paths during winter.  Clean up any spills in your garage right away. This includes oil or grease spills. What can I do in the bathroom?  Use night lights.  Install grab bars by the toilet and in the tub and shower. Do not use towel bars as grab bars.  Use non-skid mats or decals in the tub or shower.  If you need to sit down in the shower, use a plastic, non-slip stool.  Keep the floor dry. Clean up any water that spills on the floor as soon as it happens.  Remove soap buildup in the tub or shower regularly.  Attach bath mats securely with double-sided non-slip rug tape.  Do not have throw rugs and other things on the floor that can make you trip. What can I do in the bedroom?  Use night lights.  Make sure that you  have a light by your bed that is easy to reach.  Do not use any sheets or blankets that are too big for your bed. They should not hang down onto the floor.  Have a firm chair that has side arms. You can use this for support while you get dressed.  Do not have throw rugs and other things on the floor that can make you trip. What can I do in the kitchen?  Clean up any spills right away.  Avoid walking on wet floors.  Keep items that you use a lot in easy-to-reach places.  If you need to reach something above you, use a strong step stool that has a grab bar.  Keep electrical cords out of the way.  Do not use floor polish or wax that makes floors slippery. If you must use wax, use non-skid floor wax.  Do not have throw rugs and other things on the floor that can make you trip. What can I do with my stairs?  Do not leave any items on the stairs.  Make sure that there are handrails on both sides of the stairs and use them. Fix  handrails that are broken or loose. Make sure that handrails are as long as the stairways.  Check any carpeting to make sure that it is firmly attached to the stairs. Fix any carpet that is loose or worn.  Avoid having throw rugs at the top or bottom of the stairs. If you do have throw rugs, attach them to the floor with carpet tape.  Make sure that you have a light switch at the top of the stairs and the bottom of the stairs. If you do not have them, ask someone to add them for you. What else can I do to help prevent falls?  Wear shoes that:  Do not have high heels.  Have rubber bottoms.  Are comfortable and fit you well.  Are closed at the toe. Do not wear sandals.  If you use a stepladder:  Make sure that it is fully opened. Do not climb a closed stepladder.  Make sure that both sides of the stepladder are locked into place.  Ask someone to hold it for you, if possible.  Clearly mark and make sure that you can see:  Any grab bars or  handrails.  First and last steps.  Where the edge of each step is.  Use tools that help you move around (mobility aids) if they are needed. These include:  Canes.  Walkers.  Scooters.  Crutches.  Turn on the lights when you go into a dark area. Replace any light bulbs as soon as they burn out.  Set up your furniture so you have a clear path. Avoid moving your furniture around.  If any of your floors are uneven, fix them.  If there are any pets around you, be aware of where they are.  Review your medicines with your doctor. Some medicines can make you feel dizzy. This can increase your chance of falling. Ask your doctor what other things that you can do to help prevent falls. This information is not intended to replace advice given to you by your health care provider. Make sure you discuss any questions you have with your health care provider. Document Released: 07/02/2009 Document Revised: 02/11/2016 Document Reviewed: 10/10/2014 Elsevier Interactive Patient Education  2017 [employer].

## 2019-01-24 ENCOUNTER — Ambulatory Visit: Payer: Self-pay

## 2019-01-28 ENCOUNTER — Telehealth: Payer: Self-pay | Admitting: Emergency Medicine

## 2019-01-28 NOTE — Telephone Encounter (Signed)
Called patient and informed him of negative covid 19 test.

## 2019-02-04 ENCOUNTER — Telehealth: Payer: Self-pay | Admitting: Family Medicine

## 2019-02-04 NOTE — Telephone Encounter (Unsigned)
Copied from [place] [phone]. Topic: Quick Communication - Rx Refill/Question >> Feb 04, 2019  4:13 PM Celene Kras A wrote: Medication: Viagra 20mg   Has the patient contacted their pharmacy? No. Pt called stating cialis is not strong enough for him. Please advise.  (Agent: If no, request that the patient contact the pharmacy for the refill.) (Agent: If yes, when and what did the pharmacy advise?)  Preferred Pharmacy (with phone number or street name): [facility] ([address] [place] - [address] [place] [address] Phone: [phone] Fax: [phone] Not a 24 hour pharmacy; exact hours not known.    Agent: Please be advised that RX refills may take up to 3 business days. We ask that you follow-up with your pharmacy.

## 2019-02-05 ENCOUNTER — Ambulatory Visit (INDEPENDENT_AMBULATORY_CARE_PROVIDER_SITE_OTHER): Payer: Medicare Other | Admitting: Family Medicine

## 2019-02-05 ENCOUNTER — Other Ambulatory Visit: Payer: Self-pay

## 2019-02-05 ENCOUNTER — Encounter: Payer: Self-pay | Admitting: Family Medicine

## 2019-02-05 VITALS — BP 131/73 | HR 77 | Temp 98.5°F | Ht 62.0 in | Wt 175.0 lb

## 2019-02-05 DIAGNOSIS — N522 Drug-induced erectile dysfunction: Secondary | ICD-10-CM

## 2019-02-05 NOTE — Telephone Encounter (Signed)
Needs an appointment if he wants to change medicine

## 2019-02-05 NOTE — Assessment & Plan Note (Addendum)
Would like to take his cialis every other day. Discussed pharmacology of cialis. He already has refills. Call with any concerns.

## 2019-02-05 NOTE — Progress Notes (Signed)
BP 131/73   Pulse 77   Temp 98.5 F (36.9 C) (Oral)   Ht 5\' 2"  (1.575 m)   Wt 175 lb (79.4 kg)   SpO2 95%   BMI 32.01 kg/m    Subjective:    Patient ID: Mitchell [name], male    DOB: 09/09/1956, 63 y.o.   MRN: [id number]  HPI: Mitchell [name] is a 63 y.o. male  Chief Complaint  Patient presents with  . Medication Refill    tadalafil   Has been taking cialis, and was concerned that it wasn't giving him an erection within 30 minutes. He notes that it does work, but just not immediately. He is otherwise doing well with no other concerns or complaints at this time.   Relevant past medical, surgical, family and social history reviewed and updated as indicated. Interim medical history since our last visit reviewed. Allergies and medications reviewed and updated.  Review of Systems  Constitutional: Negative.   Respiratory: Negative.   Cardiovascular: Negative.   Genitourinary: Negative.   Neurological: Negative.   Psychiatric/Behavioral: Negative.     Per HPI unless specifically indicated above     Objective:    BP 131/73   Pulse 77   Temp 98.5 F (36.9 C) (Oral)   Ht 5\' 2"  (1.575 m)   Wt 175 lb (79.4 kg)   SpO2 95%   BMI 32.01 kg/m   Wt Readings from Last 3 Encounters:  02/05/19 175 lb (79.4 kg)  01/21/19 165 lb (74.8 kg)  01/11/19 165 lb (74.8 kg)    Physical Exam Vitals signs and nursing note reviewed.  Constitutional:      General: He is not in acute distress.    Appearance: Normal appearance. He is not ill-appearing, toxic-appearing or diaphoretic.  HENT:     Head: Normocephalic and atraumatic.     Right Ear: External ear normal.     Left Ear: External ear normal.     Nose: Nose normal.     Mouth/Throat:     Mouth: Mucous membranes are moist.     Pharynx: Oropharynx is clear.  Eyes:     General: No scleral icterus.       Right eye: No discharge.        Left eye: No discharge.     Extraocular Movements: Extraocular movements intact.   Conjunctiva/sclera: Conjunctivae normal.     Pupils: Pupils are equal, round, and reactive to light.  Neck:     Musculoskeletal: Normal range of motion and neck supple.  Cardiovascular:     Rate and Rhythm: Normal rate and regular rhythm.     Pulses: Normal pulses.     Heart sounds: Normal heart sounds. No murmur. No friction rub. No gallop.   Pulmonary:     Effort: Pulmonary effort is normal. No respiratory distress.     Breath sounds: Normal breath sounds. No stridor. No wheezing, rhonchi or rales.  Chest:     Chest wall: No tenderness.  Musculoskeletal: Normal range of motion.  Skin:    General: Skin is warm and dry.     Capillary Refill: Capillary refill takes less than 2 seconds.     Coloration: Skin is not jaundiced or pale.     Findings: No bruising, erythema, lesion or rash.  Neurological:     General: No focal deficit present.     Mental Status: He is alert and oriented to person, place, and time. Mental status is at baseline.  Psychiatric:  Mood and Affect: Mood normal.        Behavior: Behavior normal.        Thought Content: Thought content normal.        Judgment: Judgment normal.     Results for orders placed or performed during the hospital encounter of 01/19/19  Novel Coronavirus,NAA,(SEND-OUT TO REF LAB - TAT 24-48 hrs); Hosp Order  Result Value Ref Range   SARS-CoV-2, NAA NOT DETECTED NOT DETECTED   Coronavirus Source NASOPHARYNGEAL       Assessment & Plan:   Problem List Items Addressed This Visit      Genitourinary   Drug-induced erectile dysfunction - Primary    Would like to take his cialis every other day. Discussed pharmacology of cialis. He already has refills. Call with any concerns.           Follow up plan: Return As scheduled for July.

## 2019-02-06 DIAGNOSIS — L570 Actinic keratosis: Secondary | ICD-10-CM | POA: Diagnosis not present

## 2019-02-06 DIAGNOSIS — L812 Freckles: Secondary | ICD-10-CM | POA: Diagnosis not present

## 2019-02-06 DIAGNOSIS — L578 Other skin changes due to chronic exposure to nonionizing radiation: Secondary | ICD-10-CM | POA: Diagnosis not present

## 2019-02-06 DIAGNOSIS — Z1283 Encounter for screening for malignant neoplasm of skin: Secondary | ICD-10-CM | POA: Diagnosis not present

## 2019-02-06 DIAGNOSIS — L821 Other seborrheic keratosis: Secondary | ICD-10-CM | POA: Diagnosis not present

## 2019-02-21 ENCOUNTER — Other Ambulatory Visit: Payer: Self-pay | Admitting: Family Medicine

## 2019-02-21 DIAGNOSIS — I10 Essential (primary) hypertension: Secondary | ICD-10-CM

## 2019-03-21 ENCOUNTER — Encounter: Payer: Self-pay | Admitting: Family Medicine

## 2019-03-21 ENCOUNTER — Ambulatory Visit (INDEPENDENT_AMBULATORY_CARE_PROVIDER_SITE_OTHER): Payer: Medicare Other | Admitting: Family Medicine

## 2019-03-21 ENCOUNTER — Other Ambulatory Visit: Payer: Self-pay

## 2019-03-21 VITALS — BP 138/74 | HR 76 | Temp 98.0°F | Ht 62.0 in | Wt 176.0 lb

## 2019-03-21 DIAGNOSIS — R7301 Impaired fasting glucose: Secondary | ICD-10-CM

## 2019-03-21 DIAGNOSIS — I10 Essential (primary) hypertension: Secondary | ICD-10-CM

## 2019-03-21 DIAGNOSIS — N138 Other obstructive and reflux uropathy: Secondary | ICD-10-CM

## 2019-03-21 DIAGNOSIS — B1920 Unspecified viral hepatitis C without hepatic coma: Secondary | ICD-10-CM

## 2019-03-21 DIAGNOSIS — E038 Other specified hypothyroidism: Secondary | ICD-10-CM

## 2019-03-21 DIAGNOSIS — Z0000 Encounter for general adult medical examination without abnormal findings: Secondary | ICD-10-CM | POA: Diagnosis not present

## 2019-03-21 DIAGNOSIS — N401 Enlarged prostate with lower urinary tract symptoms: Secondary | ICD-10-CM

## 2019-03-21 DIAGNOSIS — E782 Mixed hyperlipidemia: Secondary | ICD-10-CM

## 2019-03-21 LAB — UA/M W/RFLX CULTURE, ROUTINE
Bilirubin, UA: NEGATIVE
Glucose, UA: NEGATIVE
Leukocytes,UA: NEGATIVE
Nitrite, UA: NEGATIVE
Protein,UA: NEGATIVE
RBC, UA: NEGATIVE
Specific Gravity, UA: 1.025 (ref 1.005–1.030)
Urobilinogen, Ur: 0.2 mg/dL (ref 0.2–1.0)
pH, UA: 6 (ref 5.0–7.5)

## 2019-03-21 LAB — BAYER DCA HB A1C WAIVED: HB A1C (BAYER DCA - WAIVED): 5.8 % (ref <7.0)

## 2019-03-21 MED ORDER — ASPIRIN EC 81 MG PO TBEC: 81.0000 mg | DELAYED_RELEASE_TABLET | Freq: Every day | ORAL | 4 refills | Status: DC

## 2019-03-21 MED ORDER — LISINOPRIL 5 MG PO TABS: 7.5000 mg | ORAL_TABLET | Freq: Every day | ORAL | 1 refills | Status: DC

## 2019-03-21 MED ORDER — ALBUTEROL SULFATE HFA 108 (90 BASE) MCG/ACT IN AERS: 2.0000 | INHALATION_SPRAY | Freq: Four times a day (QID) | RESPIRATORY_TRACT | 3 refills | Status: DC | PRN

## 2019-03-21 MED ORDER — FLOVENT HFA 220 MCG/ACT IN AERO: 2.0000 | INHALATION_SPRAY | Freq: Two times a day (BID) | RESPIRATORY_TRACT | 3 refills | Status: DC

## 2019-03-21 MED ORDER — ROSUVASTATIN CALCIUM 10 MG PO TABS: 10.0000 mg | ORAL_TABLET | Freq: Every day | ORAL | 1 refills | Status: DC

## 2019-03-21 NOTE — Assessment & Plan Note (Signed)
Under good control on current regimen. Continue current regimen. Continue to monitor. Call with any concerns. Refills given. Labs drawn today.   

## 2019-03-21 NOTE — Assessment & Plan Note (Signed)
Spontaneously cleared- will check labs today. Await results.

## 2019-03-21 NOTE — Patient Instructions (Signed)

## 2019-03-21 NOTE — Progress Notes (Signed)
BP 138/74   Pulse 76   Temp 98 F (36.7 C) (Oral)   Ht 5\' 2"  (1.575 m)   Wt 176 lb (79.8 kg)   SpO2 96%   BMI 32.19 kg/m    Subjective:    Patient ID: Mitchell [name], male    DOB: Jun 25, 1956, 63 y.o.   MRN: [id number]  HPI: Mitchell [name] is a 63 y.o. male presenting on 03/21/2019 for comprehensive medical examination. Current medical complaints include:  HYPERTENSION / HYPERLIPIDEMIA Satisfied with current treatment? yes Duration of hypertension: chronic BP monitoring frequency: not checking BP medication side effects: no Past BP meds: lisinopril Duration of hyperlipidemia: chronic Cholesterol medication side effects: no Cholesterol supplements: none Past cholesterol medications: crestor Medication compliance: excellent compliance Aspirin: yes Recent stressors: no Recurrent headaches: no Visual changes: no Palpitations: no Dyspnea: no Chest pain: no Lower extremity edema: no Dizzy/lightheaded: no  Impaired Fasting Glucose HbA1C:  Lab Results  Component Value Date   HGBA1C 6.0 10/16/2018   Duration of elevated blood sugar: chronic Polydipsia: no Polyuria: no Weight change: no Visual disturbance: no Glucose Monitoring: no Diabetic Education: Not Completed Family history of diabetes: no  HYPOTHYROIDISM Thyroid control status:controlled Satisfied with current treatment? yes Medication side effects: no Medication compliance: excellent compliance Recent dose adjustment:no Fatigue: no Cold intolerance: no Heat intolerance: no Weight gain: no Weight loss: no Constipation: no Diarrhea/loose stools: no Palpitations: no Lower extremity edema: no Anxiety/depressed mood: no   He currently lives with: wife Interim Problems from his last visit: no  Depression Screen done today and results listed below:  Depression screen [facility] 2/9 03/21/2019 01/21/2019 01/15/2018 01/15/2018 07/18/2017  Decreased Interest 0 0 0 0 0  Down, Depressed, Hopeless 0 0 0 0 0  PHQ - 2  Score 0 0 0 0 0  Altered sleeping 0 - 0 - -  Tired, decreased energy 1 - 0 - -  Change in appetite 0 - 0 - -  Feeling bad or failure about yourself  0 - 0 - -  Trouble concentrating 0 - 0 - -  Moving slowly or fidgety/restless 0 - 0 - -  Suicidal thoughts 0 - 0 - -  PHQ-9 Score 1 - 0 - -  Difficult doing work/chores Not difficult at all - Not difficult at all - -     Past Medical History:  Past Medical History:  Diagnosis Date  . Anxiety   . Cancer ([place])    skin cancer  . Depression   . Epididymitis   . Hepatitis C    Tested positive, treated now told that he does not have it  . Hydrocele   . Hypertension   . IFG (impaired fasting glucose)   . Insomnia   . Kidney stone   . Schizophrenia [facility])     Surgical History:  Past Surgical History:  Procedure Laterality Date  . ANKLE FRACTURE SURGERY    . COLONOSCOPY  2010  . COLONOSCOPY WITH PROPOFOL  06/07/2016   Procedure: COLONOSCOPY WITH PROPOFOL;  Surgeon: [name], MD;  Location: [facility] ENDOSCOPY;  Service: Endoscopy;;  . ESOPHAGOGASTRODUODENOSCOPY (EGD) WITH PROPOFOL N/A 06/07/2016   Procedure: ESOPHAGOGASTRODUODENOSCOPY (EGD) WITH PROPOFOL;  Surgeon: [name], MD;  Location: [facility] ENDOSCOPY;  Service: Endoscopy;  Laterality: N/A;  . HERNIA REPAIR  2011   X 2    Medications:  Current Outpatient Medications on File Prior to Visit  Medication Sig  . levothyroxine (SYNTHROID, LEVOTHROID) 50 MCG tablet TAKE 1 TABLET BY MOUTH  DAILY BEFORE BREAKFAST  . loratadine (CLARITIN) 10 MG tablet Take 10 mg by mouth daily.  . Multiple Vitamin (MULTI-VITAMINS) TABS Take by mouth.  . polyethylene glycol powder (GLYCOLAX/MIRALAX) powder Take 17 g by mouth 2 (two) times daily as needed.  . tadalafil (ADCIRCA/CIALIS) 20 MG tablet Take 0.5-1 tablets (10-20 mg total) by mouth every other day as needed for erectile dysfunction.   No current facility-administered medications on file prior to visit.     Allergies:  Allergies  Allergen  Reactions  . Haloperidol Decanoate Other (See Comments)    Muscle pain  . Atorvastatin Other (See Comments)    myalgias  . Codeine Nausea And Vomiting    Social History:  Social History   Socioeconomic History  . Marital status: Married    Spouse name: Not on file  . Number of children: Not on file  . Years of education: Not on file  . Highest education level: Not on file  Occupational History  . Occupation: retired   Scientific laboratory technician  . Financial resource strain: Not hard at all  . Food insecurity    Worry: Never true    Inability: Never true  . Transportation needs    Medical: No    Non-medical: No  Tobacco Use  . Smoking status: Former Smoker    Quit date: 07/06/1995    Years since quitting: 23.7  . Smokeless tobacco: Former Network engineer and Sexual Activity  . Alcohol use: Yes    Alcohol/week: 1.0 standard drinks    Types: 1 Cans of beer per week    Comment: remote history, quit 20+ years ago (1 beer q 2-3 days)  . Drug use: No    Comment: remote history, quit 20= years ago  . Sexual activity: Yes  Lifestyle  . Physical activity    Days per week: 0 days    Minutes per session: 0 min  . Stress: Not at all  Relationships  . Social connections    Talks on phone: More than three times a week    Gets together: More than three times a week    Attends religious service: More than 4 times per year    Active member of club or organization: No    Attends meetings of clubs or organizations: Never    Relationship status: Married  . Intimate partner violence    Fear of current or ex partner: No    Emotionally abused: No    Physically abused: No    Forced sexual activity: No  Other Topics Concern  . Not on file  Social History Narrative  . Not on file   Social History   Tobacco Use  Smoking Status Former Smoker  . Quit date: 07/06/1995  . Years since quitting: 23.7  Smokeless Tobacco Former Systems developer   Social History   Substance and Sexual Activity  Alcohol  Use Yes  . Alcohol/week: 1.0 standard drinks  . Types: 1 Cans of beer per week   Comment: remote history, quit 20+ years ago (1 beer q 2-3 days)    Family History:  Family History  Problem Relation Age of Onset  . Diabetes Mother   . Alzheimer's disease Father   . Diabetes Brother   . Diabetes Maternal Grandfather     Past medical history, surgical history, medications, allergies, family history and social history reviewed with patient today and changes made to appropriate areas of the chart.   Review of Systems  Constitutional: Negative.   HENT:  Negative.   Eyes: Negative.   Respiratory: Negative.   Cardiovascular: Negative.   Gastrointestinal: Positive for constipation and heartburn. Negative for abdominal pain, blood in stool, diarrhea, melena, nausea and vomiting.  Genitourinary: Negative.   Musculoskeletal: Positive for joint pain (R shoulder pain). Negative for back pain, falls, myalgias and neck pain.  Skin: Negative.   Neurological: Negative.   Endo/Heme/Allergies: Positive for environmental allergies. Negative for polydipsia. Does not bruise/bleed easily.  Psychiatric/Behavioral: Negative.     All other ROS negative except what is listed above and in the HPI.      Objective:    BP 138/74   Pulse 76   Temp 98 F (36.7 C) (Oral)   Ht 5\' 2"  (1.575 m)   Wt 176 lb (79.8 kg)   SpO2 96%   BMI 32.19 kg/m   Wt Readings from Last 3 Encounters:  03/21/19 176 lb (79.8 kg)  02/05/19 175 lb (79.4 kg)  01/21/19 165 lb (74.8 kg)    Physical Exam Vitals signs and nursing note reviewed.  Constitutional:      General: He is not in acute distress.    Appearance: Normal appearance. He is obese. He is not ill-appearing, toxic-appearing or diaphoretic.  HENT:     Head: Normocephalic and atraumatic.     Right Ear: Tympanic membrane, ear canal and external ear normal. There is no impacted cerumen.     Left Ear: Tympanic membrane, ear canal and external ear normal. There is  no impacted cerumen.     Nose: Nose normal. No congestion or rhinorrhea.     Mouth/Throat:     Mouth: Mucous membranes are moist.     Pharynx: Oropharynx is clear. No oropharyngeal exudate or posterior oropharyngeal erythema.  Eyes:     General: No scleral icterus.       Right eye: No discharge.        Left eye: No discharge.     Extraocular Movements: Extraocular movements intact.     Conjunctiva/sclera: Conjunctivae normal.     Pupils: Pupils are equal, round, and reactive to light.  Neck:     Musculoskeletal: Normal range of motion and neck supple. No neck rigidity or muscular tenderness.     Vascular: No carotid bruit.  Cardiovascular:     Rate and Rhythm: Normal rate and regular rhythm.     Pulses: Normal pulses.     Heart sounds: No murmur. No friction rub. No gallop.   Pulmonary:     Effort: Pulmonary effort is normal. No respiratory distress.     Breath sounds: Normal breath sounds. No stridor. No wheezing, rhonchi or rales.  Chest:     Chest wall: No tenderness.  Abdominal:     General: Abdomen is flat. Bowel sounds are normal. There is no distension.     Palpations: Abdomen is soft. There is no mass.     Tenderness: There is no abdominal tenderness. There is no right CVA tenderness, left CVA tenderness, guarding or rebound.     Hernia: No hernia is present.  Genitourinary:    Comments: Genital exam deferred with shared decision making Musculoskeletal:        General: No swelling, tenderness, deformity or signs of injury.     Right lower leg: No edema.     Left lower leg: No edema.  Lymphadenopathy:     Cervical: No cervical adenopathy.  Skin:    General: Skin is warm and dry.     Capillary Refill: Capillary refill takes  less than 2 seconds.     Coloration: Skin is not jaundiced or pale.     Findings: No bruising, erythema, lesion or rash.  Neurological:     General: No focal deficit present.     Mental Status: He is alert and oriented to person, place, and time.      Cranial Nerves: No cranial nerve deficit.     Sensory: No sensory deficit.     Motor: No weakness.     Coordination: Coordination normal.     Gait: Gait normal.     Deep Tendon Reflexes: Reflexes normal.  Psychiatric:        Mood and Affect: Mood normal.        Behavior: Behavior normal.        Thought Content: Thought content normal.        Judgment: Judgment normal.     Results for orders placed or performed during the hospital encounter of 01/19/19  Novel Coronavirus,NAA,(SEND-OUT TO REF LAB - TAT 24-48 hrs); Hosp Order   Specimen: Nasopharyngeal Swab; Respiratory  Result Value Ref Range   SARS-CoV-2, NAA NOT DETECTED NOT DETECTED   Coronavirus Source NASOPHARYNGEAL       Assessment & Plan:   Problem List Items Addressed This Visit      Cardiovascular and Mediastinum   Hypertension    Under good control on current regimen. Continue current regimen. Continue to monitor. Call with any concerns. Refills given. Labs drawn today.       Relevant Medications   lisinopril (ZESTRIL) 5 MG tablet   rosuvastatin (CRESTOR) 10 MG tablet   aspirin EC 81 MG tablet   Other Relevant Orders   CBC with Differential/Platelet   Comprehensive metabolic panel   UA/M w/rflx Culture, Routine     Digestive   Hepatitis C    Spontaneously cleared- will check labs today. Await results.       Relevant Orders   HCV RNA quant     Endocrine   IFG (impaired fasting glucose)    Under good control on current regimen. Continue current regimen. Continue to monitor. Call with any concerns. Refills given. Labs drawn today.      Relevant Orders   Bayer DCA Hb A1c Waived   CBC with Differential/Platelet   Comprehensive metabolic panel   UA/M w/rflx Culture, Routine   Hypothyroidism    Rechecking levels today. Will treat as needed. Call with any concerns.       Relevant Orders   CBC with Differential/Platelet   Comprehensive metabolic panel   TSH   UA/M w/rflx Culture, Routine      Genitourinary   BPH with obstruction/lower urinary tract symptoms    Under good control on current regimen. Continue current regimen. Continue to monitor. Call with any concerns. Refills given. Labs drawn today.      Relevant Orders   CBC with Differential/Platelet   Comprehensive metabolic panel   PSA   UA/M w/rflx Culture, Routine     Other   Hyperlipidemia    Under good control on current regimen. Continue current regimen. Continue to monitor. Call with any concerns. Refills given. Labs drawn today.      Relevant Medications   lisinopril (ZESTRIL) 5 MG tablet   rosuvastatin (CRESTOR) 10 MG tablet   aspirin EC 81 MG tablet   Other Relevant Orders   CBC with Differential/Platelet   Comprehensive metabolic panel   Lipid Panel w/o Chol/HDL Ratio   UA/M w/rflx Culture, Routine    Other  Visit Diagnoses    Routine general medical examination at a health care facility    -  Primary   Vaccines up to date. Screening labs checked today. Colonoscopy up to date. Continue diet and exercise. Call with any concerns.        Discussed aspirin prophylaxis for myocardial infarction prevention and decision was made to continue ASA  LABORATORY TESTING:  Health maintenance labs ordered today as discussed above.   The natural history of prostate cancer and ongoing controversy regarding screening and potential treatment outcomes of prostate cancer has been discussed with the patient. The meaning of a false positive PSA and a false negative PSA has been discussed. He indicates understanding of the limitations of this screening test and wishes to proceed with screening PSA testing.   IMMUNIZATIONS:   - Tdap: Tetanus vaccination status reviewed: last tetanus booster within 10 years. - Influenza: Postponed to flu season - Pneumovax: Not applicable - Prevnar: Not applicable  SCREENING: - Colonoscopy: Up to date  Discussed with patient purpose of the colonoscopy is to detect colon cancer at  curable precancerous or early stages   PATIENT COUNSELING:    Sexuality: Discussed sexually transmitted diseases, partner selection, use of condoms, avoidance of unintended pregnancy  and contraceptive alternatives.   Advised to avoid cigarette smoking.  I discussed with the patient that most people either abstain from alcohol or drink within safe limits (<=14/week and <=4 drinks/occasion for males, <=7/weeks and <= 3 drinks/occasion for females) and that the risk for alcohol disorders and other health effects rises proportionally with the number of drinks per week and how often a drinker exceeds daily limits.  Discussed cessation/primary prevention of drug use and availability of treatment for abuse.   Diet: Encouraged to adjust caloric intake to maintain  or achieve ideal body weight, to reduce intake of dietary saturated fat and total fat, to limit sodium intake by avoiding high sodium foods and not adding table salt, and to maintain adequate dietary potassium and calcium preferably from fresh fruits, vegetables, and low-fat dairy products.    stressed the importance of regular exercise  Injury prevention: Discussed safety belts, safety helmets, smoke detector, smoking near bedding or upholstery.   Dental health: Discussed importance of regular tooth brushing, flossing, and dental visits.   Follow up plan: NEXT PREVENTATIVE PHYSICAL DUE IN 1 YEAR. Return in about 6 months (around 09/21/2019) for 6 month follow up.

## 2019-03-21 NOTE — Assessment & Plan Note (Signed)
Rechecking levels today. Will treat as needed. Call with any concerns.  

## 2019-03-22 LAB — CBC WITH DIFFERENTIAL/PLATELET
Basophils Absolute: 0 10*3/uL (ref 0.0–0.2)
Basos: 1 %
EOS (ABSOLUTE): 0.4 10*3/uL (ref 0.0–0.4)
Eos: 4 %
Hematocrit: 43.9 % (ref 37.5–51.0)
Hemoglobin: 14.9 g/dL (ref 13.0–17.7)
Immature Grans (Abs): 0 10*3/uL (ref 0.0–0.1)
Immature Granulocytes: 0 %
Lymphocytes Absolute: 2.6 10*3/uL (ref 0.7–3.1)
Lymphs: 31 %
MCH: 31.4 pg (ref 26.6–33.0)
MCHC: 33.9 g/dL (ref 31.5–35.7)
MCV: 93 fL (ref 79–97)
Monocytes Absolute: 0.7 10*3/uL (ref 0.1–0.9)
Monocytes: 9 %
Neutrophils Absolute: 4.7 10*3/uL (ref 1.4–7.0)
Neutrophils: 55 %
Platelets: 286 10*3/uL (ref 150–450)
RBC: 4.74 x10E6/uL (ref 4.14–5.80)
RDW: 13 % (ref 11.6–15.4)
WBC: 8.5 10*3/uL (ref 3.4–10.8)

## 2019-03-22 LAB — COMPREHENSIVE METABOLIC PANEL
ALT: 21 IU/L (ref 0–44)
AST: 20 IU/L (ref 0–40)
Albumin/Globulin Ratio: 1.4 (ref 1.2–2.2)
Albumin: 4.3 g/dL (ref 3.8–4.8)
Alkaline Phosphatase: 42 IU/L (ref 39–117)
BUN/Creatinine Ratio: 12 (ref 10–24)
BUN: 23 mg/dL (ref 8–27)
Bilirubin Total: <0.2 mg/dL (ref 0.0–1.2)
CO2: 22 mmol/L (ref 20–29)
Calcium: 9.5 mg/dL (ref 8.6–10.2)
Chloride: 106 mmol/L (ref 96–106)
Creatinine, Ser: 1.86 mg/dL — ABNORMAL HIGH (ref 0.76–1.27)
GFR calc Af Amer: 43 mL/min/{1.73_m2} — ABNORMAL LOW (ref >59)
GFR calc non Af Amer: 37 mL/min/{1.73_m2} — ABNORMAL LOW (ref >59)
Globulin, Total: 3.1 g/dL (ref 1.5–4.5)
Glucose: 100 mg/dL — ABNORMAL HIGH (ref 65–99)
Potassium: 4.5 mmol/L (ref 3.5–5.2)
Sodium: 141 mmol/L (ref 134–144)
Total Protein: 7.4 g/dL (ref 6.0–8.5)

## 2019-03-22 LAB — LIPID PANEL W/O CHOL/HDL RATIO
Cholesterol, Total: 163 mg/dL (ref 100–199)
HDL: 41 mg/dL (ref >39)
LDL Calculated: 58 mg/dL (ref 0–99)
Triglycerides: 320 mg/dL — ABNORMAL HIGH (ref 0–149)
VLDL Cholesterol Cal: 64 mg/dL — ABNORMAL HIGH (ref 5–40)

## 2019-03-22 LAB — TSH: TSH: 2.55 u[IU]/mL (ref 0.450–4.500)

## 2019-03-22 LAB — PSA: Prostate Specific Ag, Serum: 0.9 ng/mL (ref 0.0–4.0)

## 2019-03-25 ENCOUNTER — Telehealth: Payer: Self-pay | Admitting: Family Medicine

## 2019-03-25 DIAGNOSIS — N289 Disorder of kidney and ureter, unspecified: Secondary | ICD-10-CM

## 2019-03-25 NOTE — Telephone Encounter (Signed)
Please let him know that most of his labs look good, but his kidney function has gone up- I'd like him to really work on drinking a lot of water and we'll recheck it in 2 weeks. Order in. Thanks!

## 2019-03-25 NOTE — Telephone Encounter (Signed)
Patient notified. Lab appointment scheduled for 04/05/19.

## 2019-04-05 ENCOUNTER — Other Ambulatory Visit: Payer: Self-pay

## 2019-04-05 ENCOUNTER — Other Ambulatory Visit: Payer: Medicare Other

## 2019-04-05 DIAGNOSIS — N289 Disorder of kidney and ureter, unspecified: Secondary | ICD-10-CM | POA: Diagnosis not present

## 2019-04-06 LAB — BASIC METABOLIC PANEL
BUN/Creatinine Ratio: 15 (ref 10–24)
BUN: 21 mg/dL (ref 8–27)
CO2: 20 mmol/L (ref 20–29)
Calcium: 9.4 mg/dL (ref 8.6–10.2)
Chloride: 105 mmol/L (ref 96–106)
Creatinine, Ser: 1.36 mg/dL — ABNORMAL HIGH (ref 0.76–1.27)
GFR calc Af Amer: 63 mL/min/{1.73_m2} (ref >59)
GFR calc non Af Amer: 55 mL/min/{1.73_m2} — ABNORMAL LOW (ref >59)
Glucose: 128 mg/dL — ABNORMAL HIGH (ref 65–99)
Potassium: 4.4 mmol/L (ref 3.5–5.2)
Sodium: 140 mmol/L (ref 134–144)

## 2019-04-08 ENCOUNTER — Telehealth: Payer: Self-pay | Admitting: Family Medicine

## 2019-04-08 DIAGNOSIS — N289 Disorder of kidney and ureter, unspecified: Secondary | ICD-10-CM

## 2019-04-08 NOTE — Telephone Encounter (Signed)
Please let Talan know that his kidney function is better, but still not perfect. I'd like him to keep drinking water and come in in 2 weeks for a recheck. Order in. Thanks!

## 2019-04-08 NOTE — Telephone Encounter (Signed)
Patient notified. Scheduled lab visit Monday 04/22/19 at 3:30 pm

## 2019-04-15 ENCOUNTER — Telehealth: Payer: Self-pay | Admitting: Family Medicine

## 2019-04-15 NOTE — Telephone Encounter (Signed)
Called and discussed kidney function. All questions answered.

## 2019-04-15 NOTE — Telephone Encounter (Signed)
Copied from [place] [id number]. Topic: General - Other >> Apr 08, 2019  2:23 PM Alanda Slim E wrote: Reason for CRM: Pt would like to speak with [facility] and have her call him to discuss his kidneys / please advise

## 2019-04-22 ENCOUNTER — Other Ambulatory Visit: Payer: Medicare Other

## 2019-04-22 ENCOUNTER — Other Ambulatory Visit: Payer: Self-pay

## 2019-04-22 DIAGNOSIS — N289 Disorder of kidney and ureter, unspecified: Secondary | ICD-10-CM | POA: Diagnosis not present

## 2019-04-23 ENCOUNTER — Telehealth: Payer: Self-pay | Admitting: Family Medicine

## 2019-04-23 LAB — BASIC METABOLIC PANEL
BUN/Creatinine Ratio: 12 (ref 10–24)
BUN: 16 mg/dL (ref 8–27)
CO2: 20 mmol/L (ref 20–29)
Calcium: 9.5 mg/dL (ref 8.6–10.2)
Chloride: 104 mmol/L (ref 96–106)
Creatinine, Ser: 1.31 mg/dL — ABNORMAL HIGH (ref 0.76–1.27)
GFR calc Af Amer: 66 mL/min/{1.73_m2} (ref >59)
GFR calc non Af Amer: 57 mL/min/{1.73_m2} — ABNORMAL LOW (ref >59)
Glucose: 114 mg/dL — ABNORMAL HIGH (ref 65–99)
Potassium: 4.6 mmol/L (ref 3.5–5.2)
Sodium: 139 mmol/L (ref 134–144)

## 2019-04-23 NOTE — Telephone Encounter (Signed)
Please let him know that his kidney function got better. Just keep drinking water and we'll see him at his follow up. Thanks!

## 2019-04-23 NOTE — Telephone Encounter (Signed)
Patient notified

## 2019-06-06 ENCOUNTER — Telehealth: Payer: Self-pay | Admitting: Family Medicine

## 2019-06-06 ENCOUNTER — Other Ambulatory Visit: Payer: Self-pay | Admitting: Family Medicine

## 2019-06-06 NOTE — Chronic Care Management (AMB) (Signed)
°  Chronic Care Management   Outreach Note  06/06/2019 Name: Mitchell [name] MRN: [id number] DOB: 11-16-1955  Referred by: Valerie Roys, DO Reason for referral : Chronic Care Management (Initial CCM outreach was unsuccessful. )   An unsuccessful telephone outreach was attempted today. The patient was referred to the case management team by for assistance with chronic care management and care coordination.   Follow Up Plan: A HIPPA compliant phone message was left for the patient providing contact information and requesting a return call.  The care management team will reach out to the patient again over the next 7 days.  If patient returns call to provider office, please advise to call [place] at [place]  [email or web address]   ??[phone]

## 2019-06-11 NOTE — Chronic Care Management (AMB) (Signed)
Chronic Care Management   Note  06/11/2019 Name: JADAN HINOJOS MRN: [id number] DOB: [birth year]  REINHART SAULTERS is a 63 y.o. year old male who is a primary care patient of Valerie Roys, DO. I reached out to Odelia Gage by phone today in response to a referral sent by Mr. Jaryan Chicoine Launer's patient's health plan.     Mr. Kritikos was given information about Chronic Care Management services today including:  1. CCM service includes personalized support from designated clinical staff supervised by his physician, including individualized plan of care and coordination with other care providers 2. 24/7 contact phone numbers for assistance for urgent and routine care needs. 3. Service will only be billed when office clinical staff spend 20 minutes or more in a month to coordinate care. 4. Only one practitioner may furnish and bill the service in a calendar month. 5. The patient may stop CCM services at any time (effective at the end of the month) by phone call to the office staff. 6. The patient will be responsible for cost sharing (co-pay) of up to 20% of the service fee (after annual deductible is met).  Patient did not agree to enrollment in care management services and does not wish to consider at this time.  Follow up plan: The patient has been provided with contact information for the chronic care management team and has been advised to call with any health related questions or concerns.   [place]  [email or web address]   ??[phone]

## 2019-06-18 NOTE — Chronic Care Management (AMB) (Signed)
Chronic Care Management   Note  06/18/2019 Name: NELS MUNN MRN: [id number] DOB: October 15, 1955  JONOTHAN HEBERLE is a 63 y.o. year old male who is a primary care patient of Valerie Roys, DO. I reached out to Odelia Gage by phone today in response to a referral sent by Mr. Victor Langenbach [facility] health plan.     Mr. Bosher was given information about Chronic Care Management services today including:  1. CCM service includes personalized support from designated clinical staff supervised by his physician, including individualized plan of care and coordination with other care providers 2. 24/7 contact phone numbers for assistance for urgent and routine care needs. 3. Service will only be billed when office clinical staff spend 20 minutes or more in a month to coordinate care. 4. Only one practitioner may furnish and bill the service in a calendar month. 5. The patient may stop CCM services at any time (effective at the end of the month) by phone call to the office staff. 6. The patient will be responsible for cost sharing (co-pay) of up to 20% of the service fee (after annual deductible is met).  Patient agreed to services and verbal consent obtained.   Follow up plan: Telephone appointment with CCM team member scheduled for: 07/19/2019  [place]  [email or web address]   ??[phone]

## 2019-06-19 ENCOUNTER — Ambulatory Visit: Payer: Medicare Other | Admitting: Licensed Clinical Social Worker

## 2019-06-19 NOTE — Chronic Care Management (AMB) (Signed)
  Care Management   Follow Up Note   06/19/2019 Name: Mitchell [name] MRN: [id number] DOB: [birth year]  Referred by: Valerie Roys, DO Reason for referral : Care Coordination   Mitchell [name] is a 63 y.o. year old male who is a primary care patient of Valerie Roys, DO. The care management team was consulted for assistance with care management and care coordination needs.    Review of patient status, including review of consultants reports, relevant laboratory and other test results, and collaboration with appropriate care team members and the patient's provider was performed as part of comprehensive patient evaluation and provision of chronic care management services.    LCSW completed initial outreach to patient and was able to reach him successfully. HIPPA verifications received. Patient reports interest in CCM program but denies needing social work involvement at this time. Patient denies needing mental health support/education/resource connection at this time. Patient reports "I'm doing a lot better. I appreciate the concern but I am doing just fine." However, patient is open to CCM RNCM involvement.  The care management RNCM will reach out to the patient again over the next 30 days.   Eula Fried, BSW, MSW, [facility] Care [email or web address] Phone: [phone]

## 2019-07-01 ENCOUNTER — Other Ambulatory Visit: Payer: Self-pay

## 2019-07-01 NOTE — Patient Outreach (Signed)
[facility]) Care Management  07/01/2019  Mitchell [name] 1955-11-27 [id number]   Medication Adherence call to Mr. Mitchell [name] [facility] Identifiers Verify spoke with patient he is past due on Rosuvastatin 10 mg and Lisinopril 5 mg patient explain he is taking 1 tablet daily on both,patient ask if we can call [facility] to order both medications [facility] will mail out in 5-7 days. [name] is showing past due under [place].   [place] Management Direct Dial [phone]  Fax [phone] [email or web address]

## 2019-07-14 ENCOUNTER — Other Ambulatory Visit: Payer: Self-pay | Admitting: Family Medicine

## 2019-07-14 DIAGNOSIS — I10 Essential (primary) hypertension: Secondary | ICD-10-CM

## 2019-07-18 ENCOUNTER — Telehealth: Payer: Self-pay | Admitting: Family Medicine

## 2019-07-18 NOTE — Telephone Encounter (Signed)
Pt called and stated that he had a physical in 03/2019 and would like to know if he his prostate exam done. Pt would also like to know if he is due for colonoscopy. Please advise.

## 2019-07-18 NOTE — Telephone Encounter (Signed)
Routing to provider to confirm on prostate. Colonoscopy is not due until 05/2026.

## 2019-07-19 ENCOUNTER — Telehealth: Payer: Medicare Other

## 2019-07-19 NOTE — Telephone Encounter (Signed)
Pt returned the call. Advised per message below. Pt says that he would like to go ahead with having colonoscopy set up.    Please assist pt further.

## 2019-07-19 NOTE — Telephone Encounter (Signed)
Called and left a detailed message letting patient know that his labs were normal and when he is due for a colonoscopy.

## 2019-07-19 NOTE — Telephone Encounter (Signed)
He had labs done. They were normal.

## 2019-07-22 NOTE — Telephone Encounter (Signed)
Patient notified

## 2019-07-22 NOTE — Telephone Encounter (Signed)
He is not due for a colonoscopy until 2027

## 2019-08-05 DIAGNOSIS — L905 Scar conditions and fibrosis of skin: Secondary | ICD-10-CM | POA: Diagnosis not present

## 2019-08-05 DIAGNOSIS — L821 Other seborrheic keratosis: Secondary | ICD-10-CM | POA: Diagnosis not present

## 2019-08-05 DIAGNOSIS — L820 Inflamed seborrheic keratosis: Secondary | ICD-10-CM | POA: Diagnosis not present

## 2019-08-05 DIAGNOSIS — L814 Other melanin hyperpigmentation: Secondary | ICD-10-CM | POA: Diagnosis not present

## 2019-08-05 DIAGNOSIS — L578 Other skin changes due to chronic exposure to nonionizing radiation: Secondary | ICD-10-CM | POA: Diagnosis not present

## 2019-08-27 ENCOUNTER — Ambulatory Visit: Payer: Medicare Other | Admitting: Family Medicine

## 2019-09-04 ENCOUNTER — Telehealth: Payer: Self-pay

## 2019-09-23 ENCOUNTER — Other Ambulatory Visit: Payer: Self-pay

## 2019-09-23 ENCOUNTER — Ambulatory Visit (INDEPENDENT_AMBULATORY_CARE_PROVIDER_SITE_OTHER): Payer: Medicare Other | Admitting: Family Medicine

## 2019-09-23 ENCOUNTER — Encounter: Payer: Self-pay | Admitting: Family Medicine

## 2019-09-23 VITALS — BP 133/78 | HR 72 | Temp 98.7°F | Ht 62.0 in | Wt 178.0 lb

## 2019-09-23 DIAGNOSIS — N401 Enlarged prostate with lower urinary tract symptoms: Secondary | ICD-10-CM

## 2019-09-23 DIAGNOSIS — B1920 Unspecified viral hepatitis C without hepatic coma: Secondary | ICD-10-CM

## 2019-09-23 DIAGNOSIS — R7301 Impaired fasting glucose: Secondary | ICD-10-CM

## 2019-09-23 DIAGNOSIS — F331 Major depressive disorder, recurrent, moderate: Secondary | ICD-10-CM

## 2019-09-23 DIAGNOSIS — F419 Anxiety disorder, unspecified: Secondary | ICD-10-CM

## 2019-09-23 DIAGNOSIS — I10 Essential (primary) hypertension: Secondary | ICD-10-CM | POA: Diagnosis not present

## 2019-09-23 DIAGNOSIS — N138 Other obstructive and reflux uropathy: Secondary | ICD-10-CM | POA: Diagnosis not present

## 2019-09-23 DIAGNOSIS — E038 Other specified hypothyroidism: Secondary | ICD-10-CM

## 2019-09-23 DIAGNOSIS — E782 Mixed hyperlipidemia: Secondary | ICD-10-CM

## 2019-09-23 DIAGNOSIS — F209 Schizophrenia, unspecified: Secondary | ICD-10-CM

## 2019-09-23 LAB — UA/M W/RFLX CULTURE, ROUTINE
Bilirubin, UA: NEGATIVE
Glucose, UA: NEGATIVE
Leukocytes,UA: NEGATIVE
Nitrite, UA: NEGATIVE
Protein,UA: NEGATIVE
RBC, UA: NEGATIVE
Specific Gravity, UA: >1.03 — ABNORMAL HIGH (ref 1.005–1.030)
Urobilinogen, Ur: 0.2 mg/dL (ref 0.2–1.0)
pH, UA: 5.5 (ref 5.0–7.5)

## 2019-09-23 LAB — MICROALBUMIN, URINE WAIVED
Creatinine, Urine Waived: 300 mg/dL (ref 10–300)
Microalb, Ur Waived: 10 mg/L (ref 0–19)
Microalb/Creat Ratio: <30 mg/g (ref <30)

## 2019-09-23 LAB — BAYER DCA HB A1C WAIVED: HB A1C (BAYER DCA - WAIVED): 6 % (ref <7.0)

## 2019-09-23 NOTE — Assessment & Plan Note (Signed)
Under good control off medicine. Continue to monitor. Call with any concerns.

## 2019-09-23 NOTE — Assessment & Plan Note (Signed)
Under good control on current regimen. Continue current regimen. Continue to monitor. Call with any concerns. Refills given. Labs drawn today.   

## 2019-09-23 NOTE — Assessment & Plan Note (Signed)
S/p treatment

## 2019-09-23 NOTE — Assessment & Plan Note (Signed)
Rechecking levels today. Await results. Treat as needed.  

## 2019-09-23 NOTE — Assessment & Plan Note (Signed)
Under good control on diet and exercise. Continue current regimen. Continue to monitor. Call with any concerns. Refills given. Labs drawn today.

## 2019-09-23 NOTE — Progress Notes (Signed)
BP 133/78   Pulse 72   Temp 98.7 F (37.1 C) (Oral)   Ht '5\' 2"'  (1.575 m)   Wt 178 lb (80.7 kg)   SpO2 95%   BMI 32.56 kg/m    Subjective:    Patient ID: Mitchell [name], male    DOB: Jan 17, 1956, 64 y.o.   MRN: [id number]  HPI: Mitchell [name] is a 64 y.o. male  Chief Complaint  Patient presents with  . Hypertension  . IFG  . Hyperlipidemia  . Hypothyroidism  . Gastroesophageal Reflux    x a few weeks now   HYPERTENSION / [facility] Satisfied with current treatment? yes Duration of hypertension: chronic BP monitoring frequency: not checking BP medication side effects: no Past BP meds: lisinopril Duration of hyperlipidemia: chronic Cholesterol medication side effects: no Cholesterol supplements: none Past cholesterol medications: crestor Medication compliance: excellent compliance Aspirin: yes Recent stressors: no Recurrent headaches: no Visual changes: no Palpitations: no Dyspnea: no Chest pain: no Lower extremity edema: no Dizzy/lightheaded: no  Impaired Fasting Glucose HbA1C:  Lab Results  Component Value Date   HGBA1C 6.0 09/23/2019   Duration of elevated blood sugar: chronic Polydipsia: no Polyuria: no Weight change: no Visual disturbance: no Glucose Monitoring: no Diabetic Education: Not Completed Family history of diabetes: yes  HYPOTHYROIDISM Thyroid control status:controlled Satisfied with current treatment? yes Medication side effects: no Medication compliance: excellent compliance Recent dose adjustment:no Fatigue: yes Cold intolerance: no Heat intolerance: no Weight gain: no Weight loss: no Constipation: no Diarrhea/loose stools: no Palpitations: no Lower extremity edema: no Anxiety/depressed mood: no  ANXIETY/DEPRESSION Duration:controlled Anxious mood: no  Excessive worrying: no Irritability: no  Sweating: no Nausea: no Palpitations:no Hyperventilation: no Panic attacks: no Agoraphobia: no    Obscessions/compulsions: no Depressed mood: no Depression screen [facility] 2/9 09/23/2019 03/21/2019 01/21/2019 01/15/2018 01/15/2018  Decreased Interest 0 0 0 0 0  Down, Depressed, Hopeless 0 0 0 0 0  PHQ - 2 Score 0 0 0 0 0  Altered sleeping 1 0 - 0 -  Tired, decreased energy 1 1 - 0 -  Change in appetite 0 0 - 0 -  Feeling bad or failure about yourself  0 0 - 0 -  Trouble concentrating 0 0 - 0 -  Moving slowly or fidgety/restless 0 0 - 0 -  Suicidal thoughts 0 0 - 0 -  PHQ-9 Score 2 1 - 0 -  Difficult doing work/chores - Not difficult at all - Not difficult at all -   Anhedonia: no Weight changes: no Insomnia: no   Hypersomnia: no Fatigue/loss of energy: no Feelings of worthlessness: no Feelings of guilt: no Impaired concentration/indecisiveness: no Suicidal ideations: no  Crying spells: no Recent Stressors/Life Changes: no   Relationship problems: no   Family stress: no     Financial stress: no    Job stress: no    Recent death/loss: no   Relevant past medical, surgical, family and social history reviewed and updated as indicated. Interim medical history since our last visit reviewed. Allergies and medications reviewed and updated.  Review of Systems  Constitutional: Negative.   Respiratory: Negative.   Cardiovascular: Negative.   Musculoskeletal: Negative.   Neurological: Negative.   Psychiatric/Behavioral: Negative.     Per HPI unless specifically indicated above     Objective:    BP 133/78   Pulse 72   Temp 98.7 F (37.1 C) (Oral)   Ht '5\' 2"'  (1.575 m)   Wt 178 lb (80.7 kg)  SpO2 95%   BMI 32.56 kg/m   Wt Readings from Last 3 Encounters:  09/23/19 178 lb (80.7 kg)  03/21/19 176 lb (79.8 kg)  02/05/19 175 lb (79.4 kg)    Physical Exam Vitals and nursing note reviewed.  Constitutional:      General: He is not in acute distress.    Appearance: Normal appearance. He is not ill-appearing, toxic-appearing or diaphoretic.  HENT:     Head: Normocephalic and  atraumatic.     Right Ear: External ear normal.     Left Ear: External ear normal.     Nose: Nose normal.     Mouth/Throat:     Mouth: Mucous membranes are moist.     Pharynx: Oropharynx is clear.  Eyes:     General: No scleral icterus.       Right eye: No discharge.        Left eye: No discharge.     Extraocular Movements: Extraocular movements intact.     Conjunctiva/sclera: Conjunctivae normal.     Pupils: Pupils are equal, round, and reactive to light.  Cardiovascular:     Rate and Rhythm: Normal rate and regular rhythm.     Pulses: Normal pulses.     Heart sounds: Normal heart sounds. No murmur. No friction rub. No gallop.   Pulmonary:     Effort: Pulmonary effort is normal. No respiratory distress.     Breath sounds: Normal breath sounds. No stridor. No wheezing, rhonchi or rales.  Chest:     Chest wall: No tenderness.  Musculoskeletal:        General: Normal range of motion.     Cervical back: Normal range of motion and neck supple.  Skin:    General: Skin is warm and dry.     Capillary Refill: Capillary refill takes less than 2 seconds.     Coloration: Skin is not jaundiced or pale.     Findings: No bruising, erythema, lesion or rash.  Neurological:     General: No focal deficit present.     Mental Status: He is alert and oriented to person, place, and time. Mental status is at baseline.  Psychiatric:        Mood and Affect: Mood normal.        Behavior: Behavior normal.        Thought Content: Thought content normal.        Judgment: Judgment normal.     Results for orders placed or performed in visit on 09/23/19  Bayer DCA Hb A1c Waived  Result Value Ref Range   HB A1C (BAYER DCA - WAIVED) 6.0 <7.0 %  Microalbumin, Urine Waived  Result Value Ref Range   Microalb, Ur Waived 10 0 - 19 mg/L   Creatinine, Urine Waived 300 10 - 300 mg/dL   Microalb/Creat Ratio <30 <30 mg/g  UA/M w/rflx Culture, Routine   Specimen: Urine   URINE  Result Value Ref Range    Specific Gravity, UA >1.030 (H) 1.005 - 1.030   pH, UA 5.5 5.0 - 7.5   Color, UA Yellow Yellow   Appearance Ur Clear Clear   Leukocytes,UA Negative Negative   Protein,UA Negative Negative/Trace   Glucose, UA Negative Negative   Ketones, UA Trace (A) Negative   RBC, UA Negative Negative   Bilirubin, UA Negative Negative   Urobilinogen, Ur 0.2 0.2 - 1.0 mg/dL   Nitrite, UA Negative Negative      Assessment & Plan:   Problem List Items  Addressed This Visit      Cardiovascular and Mediastinum   Hypertension - Primary    Under good control on current regimen. Continue current regimen. Continue to monitor. Call with any concerns. Refills given. Labs drawn today.        Relevant Orders   CBC with Differential OUT   Comp Met (CMET)   Microalbumin, Urine Waived (Completed)     Digestive   Hepatitis C    S/p treatment      Relevant Orders   CBC with Differential OUT   Comp Met (CMET)     Endocrine   IFG (impaired fasting glucose)    Under good control on diet and exercise. Continue current regimen. Continue to monitor. Call with any concerns. Refills given. Labs drawn today.       Relevant Orders   Bayer DCA Hb A1c Waived (Completed)   CBC with Differential OUT   Comp Met (CMET)   Microalbumin, Urine Waived (Completed)   Hypothyroidism    Rechecking levels today. Await results. Treat as needed.       Relevant Orders   CBC with Differential OUT   Comp Met (CMET)   TSH     Genitourinary   BPH with obstruction/lower urinary tract symptoms    Under good control on current regimen. Continue current regimen. Continue to monitor. Call with any concerns. Refills given. Labs drawn today.       Relevant Orders   CBC with Differential OUT   Comp Met (CMET)   PSA   UA/M w/rflx Culture, Routine (Completed)     Other   Anxiety    Under good control off medicine. Continue to monitor. Call with any concerns.       Relevant Orders   CBC with Differential OUT   Comp  Met (CMET)   Depression    Under good control off medicine. Continue to monitor. Call with any concerns.       Relevant Orders   CBC with Differential OUT   Comp Met (CMET)   Schizophrenia ([place])    Under good control off medicine. Continue to monitor. Call with any concerns.       Relevant Orders   CBC with Differential OUT   Comp Met (CMET)   Hyperlipidemia    Under good control on current regimen. Continue current regimen. Continue to monitor. Call with any concerns. Refills given. Labs drawn today.       Relevant Orders   CBC with Differential OUT   Comp Met (CMET)   Lipid Panel w/o Chol/HDL Ratio OUT       Follow up plan: Return in about 6 months (around 03/22/2020) for Physical.

## 2019-09-24 ENCOUNTER — Encounter: Payer: Self-pay | Admitting: Family Medicine

## 2019-09-24 LAB — CBC WITH DIFFERENTIAL/PLATELET
Basophils Absolute: 0.1 10*3/uL (ref 0.0–0.2)
Basos: 1 %
EOS (ABSOLUTE): 0.2 10*3/uL (ref 0.0–0.4)
Eos: 2 %
Hematocrit: 44.2 % (ref 37.5–51.0)
Hemoglobin: 15.5 g/dL (ref 13.0–17.7)
Immature Grans (Abs): 0 10*3/uL (ref 0.0–0.1)
Immature Granulocytes: 0 %
Lymphocytes Absolute: 3.5 10*3/uL — ABNORMAL HIGH (ref 0.7–3.1)
Lymphs: 40 %
MCH: 32.8 pg (ref 26.6–33.0)
MCHC: 35.1 g/dL (ref 31.5–35.7)
MCV: 93 fL (ref 79–97)
Monocytes Absolute: 0.6 10*3/uL (ref 0.1–0.9)
Monocytes: 7 %
Neutrophils Absolute: 4.4 10*3/uL (ref 1.4–7.0)
Neutrophils: 50 %
Platelets: 270 10*3/uL (ref 150–450)
RBC: 4.73 x10E6/uL (ref 4.14–5.80)
RDW: 13.3 % (ref 11.6–15.4)
WBC: 8.8 10*3/uL (ref 3.4–10.8)

## 2019-09-24 LAB — COMPREHENSIVE METABOLIC PANEL
ALT: 32 IU/L (ref 0–44)
AST: 26 IU/L (ref 0–40)
Albumin/Globulin Ratio: 1.4 (ref 1.2–2.2)
Albumin: 4.1 g/dL (ref 3.8–4.8)
Alkaline Phosphatase: 47 IU/L (ref 39–117)
BUN/Creatinine Ratio: 14 (ref 10–24)
BUN: 19 mg/dL (ref 8–27)
Bilirubin Total: <0.2 mg/dL (ref 0.0–1.2)
CO2: 20 mmol/L (ref 20–29)
Calcium: 9.3 mg/dL (ref 8.6–10.2)
Chloride: 107 mmol/L — ABNORMAL HIGH (ref 96–106)
Creatinine, Ser: 1.39 mg/dL — ABNORMAL HIGH (ref 0.76–1.27)
GFR calc Af Amer: 61 mL/min/{1.73_m2} (ref >59)
GFR calc non Af Amer: 53 mL/min/{1.73_m2} — ABNORMAL LOW (ref >59)
Globulin, Total: 3 g/dL (ref 1.5–4.5)
Glucose: 93 mg/dL (ref 65–99)
Potassium: 4.4 mmol/L (ref 3.5–5.2)
Sodium: 139 mmol/L (ref 134–144)
Total Protein: 7.1 g/dL (ref 6.0–8.5)

## 2019-09-24 LAB — LIPID PANEL W/O CHOL/HDL RATIO
Cholesterol, Total: 172 mg/dL (ref 100–199)
HDL: 38 mg/dL — ABNORMAL LOW (ref >39)
LDL Chol Calc (NIH): 69 mg/dL (ref 0–99)
Triglycerides: 414 mg/dL — ABNORMAL HIGH (ref 0–149)
VLDL Cholesterol Cal: 65 mg/dL — ABNORMAL HIGH (ref 5–40)

## 2019-09-24 LAB — PSA: Prostate Specific Ag, Serum: 0.7 ng/mL (ref 0.0–4.0)

## 2019-09-24 LAB — TSH: TSH: 3.34 u[IU]/mL (ref 0.450–4.500)

## 2019-09-26 ENCOUNTER — Ambulatory Visit: Payer: Medicare Other | Admitting: Family Medicine

## 2019-10-04 ENCOUNTER — Other Ambulatory Visit: Payer: Self-pay | Admitting: Family Medicine

## 2019-10-04 ENCOUNTER — Telehealth: Payer: Self-pay | Admitting: Family Medicine

## 2019-10-04 NOTE — Telephone Encounter (Signed)
Patient is calling to ask where should he go for a Cortizone shot. Please advise Cb- [phone]

## 2019-10-04 NOTE — Telephone Encounter (Signed)
Patient would need to go to orthopedics correct?

## 2019-10-07 NOTE — Telephone Encounter (Signed)
Patient came to the office and Dr. [name] said patient needs to go to orthpedics to get the shot.

## 2019-10-07 NOTE — Telephone Encounter (Signed)
Called patient. LVM for patient to return call to the office.

## 2019-10-07 NOTE — Telephone Encounter (Signed)
Depends on where he wants a cortisone shot- we need a little bit more information.

## 2019-10-09 ENCOUNTER — Telehealth: Payer: Self-pay

## 2019-10-09 ENCOUNTER — Telehealth: Payer: Self-pay | Admitting: Family Medicine

## 2019-10-09 DIAGNOSIS — G8929 Other chronic pain: Secondary | ICD-10-CM

## 2019-10-09 NOTE — Telephone Encounter (Signed)
Called and spoke with patient. Patient stated that he is having shoulder pain and  needs a referral for a specialist/orthopedic. Please advise

## 2019-10-09 NOTE — Telephone Encounter (Signed)
Needs appt to discuss referral or can go to [facility]

## 2019-10-09 NOTE — Telephone Encounter (Signed)
Please call pt and see what he needs  Copied from [place] [phone]. Topic: General - Inquiry >> Oct 09, 2019  4:20 PM Mitchell [name] wrote: Reason for CRM: Patient wants a call back from office . Please advise

## 2019-10-10 NOTE — Telephone Encounter (Signed)
Patient would like a referral to Ortho for shoulder complications and needs Cortisone injections.

## 2019-10-10 NOTE — Telephone Encounter (Signed)
Copied from [place] [phone]. Topic: Referral - Request for Referral >> Oct 09, 2019  4:41 PM Erick Blinks wrote: Has patient seen PCP for this complaint? Yes.   *If NO, is insurance requiring patient see PCP for this issue before PCP can refer them? Referral for which specialty: Orthopedics Preferred provider/office: Highest recommended  Reason for referral: Shoulder complications, needs Cortizone injection

## 2019-10-11 NOTE — Telephone Encounter (Signed)
Lvm for pt to call back. 

## 2019-10-15 DIAGNOSIS — L578 Other skin changes due to chronic exposure to nonionizing radiation: Secondary | ICD-10-CM | POA: Diagnosis not present

## 2019-10-15 DIAGNOSIS — L820 Inflamed seborrheic keratosis: Secondary | ICD-10-CM | POA: Diagnosis not present

## 2019-10-15 DIAGNOSIS — L821 Other seborrheic keratosis: Secondary | ICD-10-CM | POA: Diagnosis not present

## 2019-10-18 ENCOUNTER — Encounter: Payer: Self-pay | Admitting: General Practice

## 2019-10-18 ENCOUNTER — Telehealth: Payer: Self-pay | Admitting: General Practice

## 2019-10-18 ENCOUNTER — Ambulatory Visit (INDEPENDENT_AMBULATORY_CARE_PROVIDER_SITE_OTHER): Payer: Medicare Other | Admitting: General Practice

## 2019-10-18 DIAGNOSIS — M25519 Pain in unspecified shoulder: Secondary | ICD-10-CM

## 2019-10-18 DIAGNOSIS — I10 Essential (primary) hypertension: Secondary | ICD-10-CM | POA: Diagnosis not present

## 2019-10-18 DIAGNOSIS — E782 Mixed hyperlipidemia: Secondary | ICD-10-CM

## 2019-10-18 DIAGNOSIS — F331 Major depressive disorder, recurrent, moderate: Secondary | ICD-10-CM | POA: Diagnosis not present

## 2019-10-18 DIAGNOSIS — G8929 Other chronic pain: Secondary | ICD-10-CM

## 2019-10-18 NOTE — Chronic Care Management (AMB) (Signed)
Chronic Care Management   Initial Visit Note  10/18/2019 Name: Mitchell [name] MRN: [id number] DOB: 16-Jul-1956  Referred by: Valerie Roys, DO Reason for referral : Chronic Care Management (Initial Outreach: Chronic disease management  and support)   Mitchell [name] is a 64 y.o. year old male who is a primary care patient of Valerie Roys, DO. The CCM team was consulted for assistance with chronic disease management and care coordination needs related to HTN, HLD, Depression and right shoulder pain  Review of patient status, including review of consultants reports, relevant laboratory and other test results, and collaboration with appropriate care team members and the patient's provider was performed as part of comprehensive patient evaluation and provision of chronic care management services.    SDOH (Social Determinants of Health) screening performed today: Biomedical engineer  Food Insecurity  Depression   Alcohol/Substance Use Tobacco Use Stress Physical Activity. See Care Plan for related entries.   Medications: Outpatient Encounter Medications as of 10/18/2019  Medication Sig  . albuterol (VENTOLIN HFA) 108 (90 Base) MCG/ACT inhaler Inhale 2 puffs into the lungs every 6 (six) hours as needed for wheezing or shortness of breath.  Marland Kitchen aspirin EC 81 MG tablet Take 1 tablet (81 mg total) by mouth daily.  Marland Kitchen FLOVENT HFA 220 MCG/ACT inhaler Inhale 2 puffs into the lungs 2 (two) times a day.  . levothyroxine (SYNTHROID, LEVOTHROID) 50 MCG tablet TAKE 1 TABLET BY MOUTH  DAILY BEFORE BREAKFAST  . lisinopril (ZESTRIL) 5 MG tablet TAKE 1 AND 1/2 TABLETS BY  MOUTH DAILY  . loratadine (CLARITIN) 10 MG tablet Take 10 mg by mouth daily.  . Multiple Vitamin (MULTI-VITAMINS) TABS Take by mouth.  . naproxen sodium (ALEVE) 220 MG tablet Take 220 mg by mouth daily as needed (patient states he takes about 2 times a week).  . polyethylene glycol powder (GLYCOLAX/MIRALAX) powder Take 17  g by mouth 2 (two) times daily as needed.  . rosuvastatin (CRESTOR) 10 MG tablet TAKE 1 TABLET BY MOUTH  DAILY  . tadalafil (CIALIS) 20 MG tablet TAKE 1/2 TO 1 TABLET BY MOUTH EVERY OTHER DAY AS NEEDED FOR ERECTILE DYSFUNCTION   No facility-administered encounter medications on file as of 10/18/2019.     Objective:   BP Readings from Last 3 Encounters:  09/23/19 133/78  03/21/19 138/74  02/05/19 131/73    Lab Results  Component Value Date   CHOL 172 09/23/2019   HDL 38 (L) 09/23/2019   LDLCALC 69 09/23/2019   TRIG 414 (H) 09/23/2019    Goals Addressed            This Visit's Progress   . RNCM: I don't really do anything special to take care of myself       Current Barriers:  . Chronic Disease Management support, education, and care coordination needs related to HTN, HLD, and Depression  Clinical Goal(s) related to HTN, HLD, and Depression:  Over the next 60 days, patient Mitchell:  . Work with the care management team to address educational, disease management, and care coordination needs  . Begin or continue self health monitoring activities as directed today Measure and record blood pressure 2 times per week and exercise at least 20 minutes 3 or more times a week . Call provider office for new or worsened signs and symptoms Blood pressure findings outside established parameters, New or worsened symptom related to HTN, HLD and depression, and worsening or unrelieved shoulder pain . Call care  management team with questions or concerns . Verbalize basic understanding of patient centered plan of care established today  Interventions related to HTN, HLD, and Depression:  . Evaluation of current treatment plans and patient's adherence to plan as established by provider . Assessed patient understanding of disease states . Assessed patient's education and care coordination needs . Provided disease specific education to patient  . Collaborated with appropriate clinical care team  members regarding patient needs  Patient Self Care Activities related to HTN, HLD, and Depression:  . Patient is unable to independently self-manage chronic health conditions  Initial goal documentation     . RNCM: I have this shoulder pain. 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 Literacy barriers  Nurse Case Manager Clinical Goal(s):  Marland Kitchen Over the next 60 days, patient Mitchell verbalize understanding of plan for treatment options related to unrelieved pain in right shoulder . Over the next 60 days, patient Mitchell attend all scheduled medical appointments: Upcoming appointment Monday for a cortisone shot in his right shoulder per the patient . Over the next 60 days, patient Mitchell demonstrate improved adherence to prescribed treatment plan for right shoulder  as evidenced bydecreased pain and discomfort in the right shoulder  Interventions:  . Evaluation of current treatment plan related to right shoulder pain and patient's adherence to plan as established by provider. . Provided education to patient re: cortisone shots and effectivness  . Reviewed medications with patient and discussed compliance and pain relief measures . Discussed plans with patient for ongoing care management follow up and provided patient with direct contact information for care management team . Reviewed scheduled/upcoming provider appointments including: appointment on 10/21/2019 for Cortisone injection . Pharmacy referral for review of current medications regimen and any questions related to cortisone injections  Patient Self Care Activities:  . Patient verbalizes understanding of plan to address shoulder pain and discomfort and treatment options . Self administers medications as prescribed . Attends all scheduled provider appointments . Calls provider office for new concerns or questions . Unable to  independently control shoulder pain as evidence of unrelieved pain in right shoulder with current treatment regimen of cream, hot/cold compresses, and taking "Aleve"  Initial goal documentation         Mr. Kerstetter was given information about Chronic Care Management services today including:  1. CCM service includes personalized support from designated clinical staff supervised by his physician, including individualized plan of care and coordination with other care providers 2. 24/7 contact phone numbers for assistance for urgent and routine care needs. 3. Service Mitchell only be billed when office clinical staff spend 20 minutes or more in a month to coordinate care. 4. Only one practitioner may furnish and bill the service in a calendar month. 5. The patient may stop CCM services at any time (effective at the end of the month) by phone call to the office staff. 6. The patient Mitchell be responsible for cost sharing (co-pay) of up to 20% of the service fee (after annual deductible is met).  Patient agreed to services and verbal consent obtained.   Plan:   Telephone follow up appointment with care management team member scheduled for: 12-07-2019 at 3:30  [place], MSN, [facility] Mobile: [phone]

## 2019-10-18 NOTE — Patient Instructions (Signed)
Visit Information  Goals Addressed            This Visit's Progress    RNCM: I don't really do anything special to take care of myself       Current Barriers:   Chronic Disease Management support, education, and care coordination needs related to HTN, HLD, and Depression  Clinical Goal(s) related to HTN, HLD, and Depression:  Over the next 60 days, patient will:   Work with the care management team to address educational, disease management, and care coordination needs   Begin or continue self health monitoring activities as directed today Measure and record blood pressure 2 times per week and exercise at least 20 minutes 3 or more times a week  Call provider office for new or worsened signs and symptoms Blood pressure findings outside established parameters, New or worsened symptom related to HTN, HLD and depression, and worsening or unrelieved shoulder pain  Call care management team with questions or concerns  Verbalize basic understanding of patient centered plan of care established today  Interventions related to HTN, HLD, and Depression:   Evaluation of current treatment plans and patient's adherence to plan as established by provider  Assessed patient understanding of disease states  Assessed patient's education and care coordination needs  Provided disease specific education to patient   Collaborated with appropriate clinical care team members regarding patient needs  Patient Self Care Activities related to HTN, HLD, and Depression:   Patient is unable to independently self-manage chronic health conditions  Initial goal documentation      RNCM: I have this shoulder pain. 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       Current Barriers:   Knowledge Deficits related to how to effectively manager pain in his right shoulder  Literacy barriers  Nurse Case Manager Clinical Goal(s):   Over the next 60 days, patient will verbalize understanding of plan  for treatment options related to unrelieved pain in right shoulder  Over the next 60 days, patient will attend all scheduled medical appointments: Upcoming appointment Monday for a cortisone shot in his right shoulder per the patient  Over the next 60 days, patient will demonstrate improved adherence to prescribed treatment plan for right shoulder  as evidenced bydecreased pain and discomfort in the right shoulder  Interventions:   Evaluation of current treatment plan related to right shoulder pain and patient's adherence to plan as established by provider.  Provided education to patient re: cortisone shots and effectivness   Reviewed medications with patient and discussed compliance and pain relief measures  Discussed plans with patient for ongoing care management follow up and provided patient with direct contact information for care management team  Reviewed scheduled/upcoming provider appointments including: appointment on 10/21/2019 for Cortisone injection  Pharmacy referral for review of current medications regimen and any questions related to cortisone injections  Patient Self Care Activities:   Patient verbalizes understanding of plan to address shoulder pain and discomfort and treatment options  Self administers medications as prescribed  Attends all scheduled provider appointments  Calls provider office for new concerns or questions  Unable to independently control shoulder pain as evidence of unrelieved pain in right shoulder with current treatment regimen of cream, hot/cold compresses, and taking "Aleve"  Initial goal documentation        Mitchell [name] was given information about Chronic Care Management services today including:  1. CCM service includes personalized support from designated clinical staff supervised by his physician,  including individualized plan of care and coordination with other care providers 2. 24/7 contact phone numbers for assistance for urgent and  routine care needs. 3. Service will only be billed when office clinical staff spend 20 minutes or more in a month to coordinate care. 4. Only one practitioner may furnish and bill the service in a calendar month. 5. The patient may stop CCM services at any time (effective at the end of the month) by phone call to the office staff. 6. The patient will be responsible for cost sharing (co-pay) of up to 20% of the service fee (after annual deductible is met).  Patient agreed to services and verbal consent obtained.   The patient verbalized understanding of instructions provided today and declined a print copy of patient instruction materials.   Telephone follow up appointment with care management team member scheduled for: 12-07-2019 at 330 pm   [name] RN, MSN, [facility] Mobile: [phone]

## 2019-10-21 DIAGNOSIS — M7541 Impingement syndrome of right shoulder: Secondary | ICD-10-CM | POA: Insufficient documentation

## 2019-10-21 DIAGNOSIS — M13811 Other specified arthritis, right shoulder: Secondary | ICD-10-CM | POA: Diagnosis not present

## 2019-11-03 ENCOUNTER — Other Ambulatory Visit: Payer: Self-pay | Admitting: Family Medicine

## 2019-11-03 NOTE — Telephone Encounter (Signed)
Requested Prescriptions  Pending Prescriptions Disp Refills  . tadalafil (CIALIS) 20 MG tablet [Pharmacy Med Name: TADALAFIL 20 MG TABLET] 5 tablet 9    Sig: TAKE 1/2 TO 1 TABLET BY MOUTH EVERY OTHER DAY AS NEEDED FOR ERECTILE DYSFUNCTION     Urology: Erectile Dysfunction Agents Passed - 11/03/2019  6:57 AM      Passed - Last BP in normal range    BP Readings from Last 1 Encounters:  09/23/19 133/78         Passed - Valid encounter within last 12 months    Recent Outpatient Visits          1 month ago Essential hypertension   [place], Megan P, DO   7 months ago Routine general medical examination at a health care facility   [facility] [place] P, DO   9 months ago Drug-induced erectile dysfunction   [facility] [place], Megan P, DO   1 year ago Constipation, unspecified constipation type   [facility] [place], [place], DO   1 year ago Essential hypertension   [place], [place], DO      Future Appointments            In 5 months [name], Barb Merino, DO [employer]

## 2019-11-18 ENCOUNTER — Other Ambulatory Visit: Payer: Self-pay | Admitting: Family Medicine

## 2019-11-18 NOTE — Telephone Encounter (Signed)
Medication Refill - Medication: tadalafil (CIALIS) 20 MG tablet   Preferred Pharmacy (with phone number or street name):  [place], [place] Phone:  [phone]  Fax:  [phone]       Agent: Please be advised that RX refills may take up to 3 business days. We ask that you follow-up with your pharmacy.

## 2019-11-18 NOTE — Telephone Encounter (Signed)
Refusing refill request. Patient should have refills. Last ordered 11/03/19 5 tabs #9 refills.

## 2019-11-19 ENCOUNTER — Other Ambulatory Visit: Payer: Self-pay | Admitting: Family Medicine

## 2019-11-19 ENCOUNTER — Telehealth: Payer: Self-pay

## 2019-11-19 DIAGNOSIS — J309 Allergic rhinitis, unspecified: Secondary | ICD-10-CM

## 2019-11-19 NOTE — Telephone Encounter (Signed)
We don't do allergy shots here. I can refer him to the allergist if he'd like to discuss allergy shots

## 2019-11-19 NOTE — Telephone Encounter (Signed)
Copied from [place] [id number]. Topic: General - Inquiry >> Nov 19, 2019  2:41 PM Richardo Priest, NT wrote: Reason for CRM: Patient called in stating he would like advice on whether or not he should take shots for his allergies. Pt states sometimes his allergies will get so bad he feels like he has the flu or has been hit by a truck. Please advise.

## 2019-11-20 NOTE — Telephone Encounter (Signed)
Called pt, he stated he is unsure if Medicaid will pay for it. Do you happen to know if they will?

## 2019-11-20 NOTE — Telephone Encounter (Signed)
Spoke with pt, he would like a referral to allergist.

## 2019-11-20 NOTE — Telephone Encounter (Signed)
I will let the pt know that as long as there is a referral placed Medicaid should pay for it.

## 2019-11-20 NOTE — Addendum Note (Signed)
Addended by: Valerie Roys on: 11/20/2019 08:34 AM   Modules accepted: Orders

## 2019-12-02 MED ORDER — TADALAFIL 20 MG PO TABS: ORAL_TABLET | ORAL | 9 refills | Status: DC

## 2019-12-02 NOTE — Addendum Note (Signed)
Addended by: Georgina Peer on: 12/02/2019 03:13 PM   Modules accepted: Orders

## 2019-12-02 NOTE — Telephone Encounter (Signed)
Please resend prescription to the pharmacy. Last one was sent but not confirmed so it did not make it to the pharmacy.

## 2019-12-02 NOTE — Addendum Note (Signed)
Addended by: Valerie Roys on: 12/02/2019 06:21 PM   Modules accepted: Orders

## 2019-12-02 NOTE — Telephone Encounter (Signed)
Pt states that pharmacy has no refills left and states that they can not refill without doctors order.  Pt wants to know what is going on with this.

## 2019-12-17 ENCOUNTER — Telehealth: Payer: Self-pay

## 2019-12-17 ENCOUNTER — Ambulatory Visit: Payer: Self-pay | Admitting: General Practice

## 2019-12-17 NOTE — Chronic Care Management (AMB) (Signed)
  Chronic Care Management   Outreach Note  12/17/2019 Name: Mitchell [name] MRN: [id number] DOB: 1956-08-01  Referred by: Valerie Roys, DO Reason for referral : Chronic Care Management (Follow up: shoulder pain/HTN/HLD/Depression)   An unsuccessful telephone outreach was attempted today. The patient was referred to the case management team for assistance with care management and care coordination.   Follow Up Plan: A HIPPA compliant phone message was left for the patient providing contact information and requesting a return call.   [name] RN, MSN, [facility] Mobile: [phone]

## 2019-12-25 ENCOUNTER — Other Ambulatory Visit: Payer: Self-pay | Admitting: Family Medicine

## 2019-12-25 DIAGNOSIS — I10 Essential (primary) hypertension: Secondary | ICD-10-CM

## 2020-01-22 ENCOUNTER — Ambulatory Visit (INDEPENDENT_AMBULATORY_CARE_PROVIDER_SITE_OTHER): Payer: Medicare Other

## 2020-01-22 VITALS — Ht 62.0 in | Wt 178.0 lb

## 2020-01-22 DIAGNOSIS — Z0000 Encounter for general adult medical examination without abnormal findings: Secondary | ICD-10-CM | POA: Diagnosis not present

## 2020-01-22 NOTE — Progress Notes (Signed)
Subjective:   Mitchell [name] is a 64 y.o. male who presents for Medicare Annual/Subsequent preventive examination.  This visit is being conducted via phone call  - after an attmept to do on video chat - due to the COVID-19 pandemic. This patient has given me verbal consent via phone to conduct this visit, patient states they are participating from their home address. Some vital signs may be absent or patient reported.   Patient identification: identified by name, DOB, and current address.    Review of Systems:   Cardiac Risk Factors include: advanced age (>64mn, >>26women);obesity (BMI >30kg/m2);dyslipidemia;hypertension;male gender     Objective:    Vitals: Ht '5\' 2"'  (1.575 m)   Wt 178 lb (80.7 kg)   BMI 32.56 kg/m   Body mass index is 32.56 kg/m.  Advanced Directives 01/22/2020 01/21/2019 01/19/2019 01/11/2019 10/12/2018 01/12/2017 01/06/2017  Does Patient Have a Medical Advance Directive? Yes;No Yes No No No No No  Type of Advance Directive - Living will;Healthcare Power of Attorney - - - - -  Copy of [place]? - No - copy requested - - - - -  Would patient like information on creating a medical advance directive? - - - - - Yes (MAU/Ambulatory/Procedural Areas - Information given) No - Patient declined    Tobacco Social History   Tobacco Use  Smoking Status Former Smoker  . Quit date: 07/06/1995  . Years since quitting: 24.5  Smokeless Tobacco Former UEngineer, structuralgiven: Not Answered   Clinical Intake:  Pre-visit preparation completed: Yes  Pain : No/denies pain     Nutritional Status: BMI > 30  Obese Nutritional Risks: None Diabetes: No  How often do you need to have someone help you when you read instructions, pamphlets, or other written materials from your doctor or pharmacy?: 1 - Never  Interpreter Needed?: No  Information entered by :: Mitchell Lamere,LPN  Past Medical History:  Diagnosis Date  . Anxiety   . [place]  . Depression   . Epididymitis   . Hepatitis C    Tested positive, treated now told that he does not have it  . Hydrocele   . Hypertension   . IFG (impaired fasting glucose)   . Insomnia   . Kidney stone   . Schizophrenia ([facility]    Past Surgical History:  Procedure Laterality Date  . ANKLE FRACTURE SURGERY    . COLONOSCOPY  2010  . COLONOSCOPY WITH PROPOFOL  06/07/2016   Procedure: COLONOSCOPY WITH PROPOFOL;  Surgeon: DLucilla Lame MD;  Location: [facility] ENDOSCOPY;  Service: Endoscopy;;  . ESOPHAGOGASTRODUODENOSCOPY (EGD) WITH PROPOFOL N/A 06/07/2016   Procedure: ESOPHAGOGASTRODUODENOSCOPY (EGD) WITH PROPOFOL;  Surgeon: DLucilla Lame MD;  Location: [facility] ENDOSCOPY;  Service: Endoscopy;  Laterality: N/A;  . HERNIA REPAIR  2011   X 2   Family History  Problem Relation Age of Onset  . Diabetes Mother   . Alzheimer's disease Father   . Diabetes Brother   . Diabetes Maternal Grandfather    Social History   Socioeconomic History  . Marital status: Married    Spouse name: TOtila Kluver . Number of children: 2  . Years of education: 9  . Highest education level: 9th grade  Occupational History  . Occupation: retired   Tobacco Use  . Smoking status: Former Smoker    Quit date: 07/06/1995    Years since quitting: 24.5  . Smokeless  tobacco: Former Network engineer and Sexual Activity  . Alcohol use: Not Currently    Comment: remote history, quit 20+ years ago (1 beer q 2-3 days)  . Drug use: No    Comment: remote history, quit 20= years ago  . Sexual activity: Yes  Other Topics Concern  . Not on file  Social History Narrative  . Not on file   Social Determinants of Health   Financial Resource Strain: Low Risk   . Difficulty of Paying Living Expenses: Not hard at all  Food Insecurity: No Food Insecurity  . Worried About Charity fundraiser in the Last Year: Never true  . Ran Out of Food in the Last Year: Never true  Transportation Needs: No Transportation Needs  .  Lack of Transportation (Medical): No  . Lack of Transportation (Non-Medical): No  Physical Activity: Insufficiently Active  . Days of Exercise per Week: 5 days  . Minutes of Exercise per Session: 20 min  Stress: No Stress Concern Present  . Feeling of Stress : Not at all  Social Connections:   . Frequency of Communication with Friends and Family:   . Frequency of Social Gatherings with Friends and Family:   . Attends Religious Services:   . Active Member of Clubs or Organizations:   . Attends Archivist Meetings:   Marland Kitchen Marital Status:     Outpatient Encounter Medications as of 01/22/2020  Medication Sig  . albuterol (VENTOLIN HFA) 108 (90 Base) MCG/ACT inhaler Inhale 2 puffs into the lungs every 6 (six) hours as needed for wheezing or shortness of breath.  Marland Kitchen aspirin EC 81 MG tablet Take 1 tablet (81 mg total) by mouth daily.  Marland Kitchen FLOVENT HFA 220 MCG/ACT inhaler Inhale 2 puffs into the lungs 2 (two) times a day.  . levothyroxine (SYNTHROID) 50 MCG tablet TAKE 1 TABLET BY MOUTH  DAILY BEFORE BREAKFAST  . lisinopril (ZESTRIL) 5 MG tablet TAKE 1 AND 1/2 TABLETS BY  MOUTH DAILY  . loratadine (CLARITIN) 10 MG tablet Take 10 mg by mouth daily.  . Multiple Vitamin (MULTI-VITAMINS) TABS Take by mouth.  . naproxen sodium (ALEVE) 220 MG tablet Take 220 mg by mouth daily as needed (patient states he takes about 2 times a week).  . polyethylene glycol powder (GLYCOLAX/MIRALAX) powder Take 17 g by mouth 2 (two) times daily as needed.  . rosuvastatin (CRESTOR) 10 MG tablet TAKE 1 TABLET BY MOUTH  DAILY  . tadalafil (CIALIS) 20 MG tablet TAKE 1/2 TO 1 TABLET BY MOUTH EVERY OTHER DAY AS NEEDED FOR ERECTILE DYSFUNCTION   No facility-administered encounter medications on file as of 01/22/2020.    Activities of Daily Living In your present state of health, do you have any difficulty performing the following activities: 01/22/2020 03/21/2019  Hearing? N N  Comment no hearing aids -  Vision? N N   Comment eyeglasses, Dr.Woodard -  Difficulty concentrating or making decisions? N N  Walking or climbing stairs? Y N  Comment leg pain -  Dressing or bathing? N N  Doing errands, shopping? N N  Preparing Food and eating ? N -  Using the Toilet? N -  In the past six months, have you accidently leaked urine? N -  Do you have problems with loss of bowel control? N -  Managing your Medications? N -  Managing your Finances? N -  Housekeeping or managing your Housekeeping? N -  Some recent data might be hidden    Patient  Care Team: Valerie Roys, DO as PCP - General (Family Medicine) Greg Cutter, LCSW as [place] Management (Licensed Clinical Social Worker) Hall Busing, Nobie Putnam, RN as Case Manager (General Practice)   Assessment:   This is a routine wellness examination for Demoni.  Exercise Activities and Dietary recommendations Current Exercise Habits: Home exercise routine, Time (Minutes): 30, Frequency (Times/Week): 7, Weekly Exercise (Minutes/Week): 210, Intensity: Mild, Exercise limited by: None identified  Goals Addressed   None     Fall Risk: Fall Risk  01/22/2020 09/23/2019 03/21/2019 01/21/2019 01/15/2018  Falls in the past year? 0 0 0 0 No  Number falls in past yr: 0 0 0 - -  Injury with Fall? 0 0 0 - -    FALL RISK PREVENTION PERTAINING TO THE HOME:  Any stairs in or around the home? No  If so, are there any without handrails? No   Home free of loose throw rugs in walkways, pet beds, electrical cords, etc? Yes  Adequate lighting in your home to reduce risk of falls? Yes   ASSISTIVE DEVICES UTILIZED TO PREVENT FALLS:  Life alert? No  Use of a cane, walker or w/c? Yes  cane  Grab bars in the bathroom? Yes  Shower chair or bench in shower? No  Elevated toilet seat or a handicapped toilet? No   TIMED UP AND GO:  Unable to perform   Depression Screen PHQ 2/9 Scores 01/22/2020 09/23/2019 03/21/2019 01/21/2019  PHQ - 2 Score 0 0 0 0  PHQ- 9 Score - 2  1 -    Cognitive Function     6CIT Screen 01/21/2019 01/12/2017  What Year? 0 points 0 points  What month? 0 points 0 points  What time? 0 points 0 points  Count back from 20 0 points 0 points  Months in reverse 0 points 0 points  Repeat phrase 0 points 4 points  Total Score 0 4    Immunization History  Administered Date(s) Administered  . Influenza,inj,Quad PF,6+ Mos 06/08/2015, 06/27/2016, 07/18/2017, 07/03/2018  . Influenza-Unspecified 07/04/2019  . Janssen (J&J) SARS-COV-2 Vaccination 12/12/2019  . MMR 01/25/1995  . Pneumococcal Polysaccharide-23 05/17/2013  . Td 07/27/2018  . Zoster 06/24/2015    Qualifies for Shingles Vaccine? Yes  Zostavax completed 2016. Due for Shingrix. Education has been provided regarding the importance of this vaccine. Pt has been advised to call insurance company to determine out of pocket expense. Advised may also receive vaccine at local pharmacy or Health Dept. Verbalized acceptance and understanding.  Tdap: up to date   Flu Vaccine:up to date   Pneumococcal Vaccine: due, will get at next visit.    Covid-19 Vaccine:  Completed vaccines  Screening Tests Health Maintenance  Topic Date Due  . PNA vac Low Risk Adult (1 of 2 - PCV13) 12/07/2019  . INFLUENZA VACCINE  04/19/2020  . COLONOSCOPY  06/07/2026  . TETANUS/TDAP  07/27/2028  . COVID-19 Vaccine  Completed  . Hepatitis C Screening  Completed  . HIV Screening  Addressed   Cancer Screenings:  Colorectal Screening: Completed 2017. Repeat every 10 years  Lung Cancer Screening: (Low Dose CT Chest recommended if Age 85-80 years, 30 pack-year currently smoking OR have quit w/in 15years.) does not qualify.     Additional Screening:  Hepatitis C Screening: does qualify; Completed 2021  Vision Screening: Recommended annual ophthalmology exams for early detection of glaucoma and other disorders of the eye. Is the patient up to date with their annual eye exam?  Yes  Who is the provider  or what is the name of the office in which the pt attends annual eye exams? Dr.Woodard .  Dental Screening: Recommended annual dental exams for proper oral hygiene  Community Resource Referral:  CRR required this visit?  No        Plan:  I have personally reviewed and addressed the Medicare Annual Wellness questionnaire and have noted the following in the patient's chart:  A. Medical and social history B. Use of alcohol, tobacco or illicit drugs  C. Current medications and supplements D. Functional ability and status E.  Nutritional status F.  Physical activity G. Advance directives H. List of other physicians I.  Hospitalizations, surgeries, and ER visits in previous 12 months J.  [place] such as hearing and vision if needed, cognitive and depression L. Referrals and appointments   In addition, I have reviewed and discussed with patient certain preventive protocols, quality metrics, and best practice recommendations. A written personalized care plan for preventive services as well as general preventive health recommendations were provided to patient.   Signed,   Bevelyn Ngo, LPN  03/27/5368 Nurse Health Advisor   Nurse Notes: none

## 2020-01-24 ENCOUNTER — Ambulatory Visit (INDEPENDENT_AMBULATORY_CARE_PROVIDER_SITE_OTHER): Payer: Medicare Other | Admitting: General Practice

## 2020-01-24 ENCOUNTER — Telehealth: Payer: Self-pay | Admitting: General Practice

## 2020-01-24 DIAGNOSIS — E782 Mixed hyperlipidemia: Secondary | ICD-10-CM

## 2020-01-24 DIAGNOSIS — F331 Major depressive disorder, recurrent, moderate: Secondary | ICD-10-CM

## 2020-01-24 DIAGNOSIS — I10 Essential (primary) hypertension: Secondary | ICD-10-CM

## 2020-01-24 DIAGNOSIS — M25519 Pain in unspecified shoulder: Secondary | ICD-10-CM

## 2020-01-24 DIAGNOSIS — G8929 Other chronic pain: Secondary | ICD-10-CM

## 2020-01-24 NOTE — Chronic Care Management (AMB) (Signed)
Chronic Care Management   Follow Up Note   01/24/2020 Name: THOAMS KERIN MRN: [id number] DOB: [birth year]  Referred by: Valerie Roys, DO Reason for referral : Chronic Care Management (Follow up: Chronic pain, HTN, HLD, Depression and other chronic conditions/needs)   OMRAN NOHR is a 64 y.o. year old male who is a primary care patient of Valerie Roys, DO. The CCM team was consulted for assistance with chronic disease management and care coordination needs.    Review of patient status, including review of consultants reports, relevant laboratory and other test results, and collaboration with appropriate care team members and the patient's provider was performed as part of comprehensive patient evaluation and provision of chronic care management services.    SDOH (Social Determinants of Health) assessments performed: No See Care Plan activities for detailed interventions related to [facility])     Outpatient Encounter Medications as of 01/24/2020  Medication Sig   albuterol (VENTOLIN HFA) 108 (90 Base) MCG/ACT inhaler Inhale 2 puffs into the lungs every 6 (six) hours as needed for wheezing or shortness of breath.   aspirin EC 81 MG tablet Take 1 tablet (81 mg total) by mouth daily.   FLOVENT HFA 220 MCG/ACT inhaler Inhale 2 puffs into the lungs 2 (two) times a day.   levothyroxine (SYNTHROID) 50 MCG tablet TAKE 1 TABLET BY MOUTH  DAILY BEFORE BREAKFAST   lisinopril (ZESTRIL) 5 MG tablet TAKE 1 AND 1/2 TABLETS BY  MOUTH DAILY   loratadine (CLARITIN) 10 MG tablet Take 10 mg by mouth daily.   Multiple Vitamin (MULTI-VITAMINS) TABS Take by mouth.   naproxen sodium (ALEVE) 220 MG tablet Take 220 mg by mouth daily as needed (patient states he takes about 2 times a week).   polyethylene glycol powder (GLYCOLAX/MIRALAX) powder Take 17 g by mouth 2 (two) times daily as needed.   rosuvastatin (CRESTOR) 10 MG tablet TAKE 1 TABLET BY MOUTH  DAILY   tadalafil (CIALIS) 20 MG tablet TAKE  1/2 TO 1 TABLET BY MOUTH EVERY OTHER DAY AS NEEDED FOR ERECTILE DYSFUNCTION   No facility-administered encounter medications on file as of 01/24/2020.     Objective:  BP Readings from Last 3 Encounters:  09/23/19 133/78  03/21/19 138/74  02/05/19 131/73    Goals Addressed            This Visit's Progress    RNCM: I don't really do anything special to take care of myself       Current Barriers:   Chronic Disease Management support, education, and care coordination needs related to HTN, HLD, and Depression  Clinical Goal(s) related to HTN, HLD, and Depression:  Over the next 60 days, patient will:   Work with the care management team to address educational, disease management, and care coordination needs   Begin or continue self health monitoring activities as directed today Measure and record blood pressure 2 times per week and exercise at least 20 minutes 3 or more times a week  Call provider office for new or worsened signs and symptoms Blood pressure findings outside established parameters, New or worsened symptom related to HTN, HLD and depression, and worsening or unrelieved shoulder pain  Call care management team with questions or concerns  Verbalize basic understanding of patient centered plan of care established today  Interventions related to HTN, HLD, and Depression:   Evaluation of current treatment plans and patient's adherence to plan as established by provider  Assessed patient understanding of disease states.  The patient verbalized he understands about his conditions and does what the doctor tells him to do. He denies any issues. Has not been taking his blood pressure at home but will start to do so.   Assessed patient's education and care coordination needs.  No new needs at this time.  The patient knows resources are available to help as needed.   Provided disease specific education to patient. Education given on the benefits of checking and recording  blood pressures at least 2 times a week.  The patient verbalized he has not taken it in a while but will start doing that. The patient verbalized that his wife makes sure he does not eat foods with salts or fats. She even takes the cholesterol out of eggs. The patient says she helps him stay healthy. Praised for healthy dietary habits. The patient is more active since now that the weather is better. He is planning on working in his yard and planting [employer] this weekend.   Collaborated with appropriate clinical care team members regarding patient needs.  The patient denies any needs for CCM team LCSW or pharmacist at this time but knows they are available.   Patient Self Care Activities related to HTN, HLD, and Depression:   Patient is unable to independently self-manage chronic health conditions  Please see past updates related to this goal by clicking on the "Past Updates" button in the selected goal       RNCM: I have this shoulder pain. 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       Current Barriers:   Knowledge Deficits related to how to effectively manager pain in his right shoulder  Literacy barriers  Nurse Case Manager Clinical Goal(s):   Over the next 120 days, patient will verbalize understanding of plan for treatment options related to unrelieved pain in right shoulder  Over the next 120 days, patient will attend all scheduled medical appointments: Upcoming appointment Monday for a cortisone shot in his right shoulder per the patient  Over the next 120 days, patient will demonstrate improved adherence to prescribed treatment plan for right shoulder  as evidenced bydecreased pain and discomfort in the right shoulder  Interventions:   Evaluation of current treatment plan related to right shoulder pain and patient's adherence to plan as established by provider.  Provided education to patient re: cortisone shots and effectiveness. The patient had a  shot a couple of months ago and states that his shoulder is doing a lot better. Still has some pain but not like he did.   Reviewed medications with patient and discussed compliance and pain relief measures  Discussed plans with patient for ongoing care management follow up and provided patient with direct contact information for care management team  Reviewed scheduled/upcoming provider appointments including: appointment on 10/21/2019 for Cortisone injection. This has done well for the patient. He denies any new concerns at this time. He follows up as needed.   Pharmacy referral for review of current medications regimen and any questions related to cortisone injections  Patient Self Care Activities:   Patient verbalizes understanding of plan to address shoulder pain and discomfort and treatment options  Self administers medications as prescribed  Attends all scheduled provider appointments  Calls provider office for new concerns or questions  Unable to independently control shoulder pain as evidence of unrelieved pain in right shoulder with current treatment regimen of cream, hot/cold compresses, and taking "Aleve"  Please see past  updates related to this goal by clicking on the "Past Updates" button in the selected goal          Plan:   The care management team will reach out to the patient again over the next 60 to 90 days.    [name] RN, MSN, [facility] Mobile: [phone]

## 2020-01-24 NOTE — Patient Instructions (Signed)
Visit Information  Goals Addressed            This Visit's Progress    RNCM: I don't really do anything special to take care of myself       Current Barriers:   Chronic Disease Management support, education, and care coordination needs related to HTN, HLD, and Depression  Clinical Goal(s) related to HTN, HLD, and Depression:  Over the next 60 days, patient will:   Work with the care management team to address educational, disease management, and care coordination needs   Begin or continue self health monitoring activities as directed today Measure and record blood pressure 2 times per week and exercise at least 20 minutes 3 or more times a week  Call provider office for new or worsened signs and symptoms Blood pressure findings outside established parameters, New or worsened symptom related to HTN, HLD and depression, and worsening or unrelieved shoulder pain  Call care management team with questions or concerns  Verbalize basic understanding of patient centered plan of care established today  Interventions related to HTN, HLD, and Depression:   Evaluation of current treatment plans and patient's adherence to plan as established by provider  Assessed patient understanding of disease states.  The patient verbalized he understands about his conditions and does what the doctor tells him to do. He denies any issues. Has not been taking his blood pressure at home but will start to do so.   Assessed patient's education and care coordination needs.  No new needs at this time.  The patient knows resources are available to help as needed.   Provided disease specific education to patient. Education given on the benefits of checking and recording blood pressures at least 2 times a week.  The patient verbalized he has not taken it in a while but will start doing that. The patient verbalized that his wife makes sure he does not eat foods with salts or fats. She even takes the cholesterol out of  eggs. The patient says she helps him stay healthy. Praised for healthy dietary habits. The patient is more active since now that the weather is better. He is planning on working in his yard and planting [employer] this weekend.   Collaborated with appropriate clinical care team members regarding patient needs.  The patient denies any needs for CCM team LCSW or pharmacist at this time but knows they are available.   Patient Self Care Activities related to HTN, HLD, and Depression:   Patient is unable to independently self-manage chronic health conditions  Please see past updates related to this goal by clicking on the "Past Updates" button in the selected goal       RNCM: I have this shoulder pain. 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       Current Barriers:   Knowledge Deficits related to how to effectively manager pain in his right shoulder  Literacy barriers  Nurse Case Manager Clinical Goal(s):   Over the next 120 days, patient will verbalize understanding of plan for treatment options related to unrelieved pain in right shoulder  Over the next 120 days, patient will attend all scheduled medical appointments: Upcoming appointment Monday for a cortisone shot in his right shoulder per the patient  Over the next 120 days, patient will demonstrate improved adherence to prescribed treatment plan for right shoulder  as evidenced bydecreased pain and discomfort in the right shoulder  Interventions:   Evaluation of current  treatment plan related to right shoulder pain and patient's adherence to plan as established by provider.  Provided education to patient re: cortisone shots and effectiveness. The patient had a shot a couple of months ago and states that his shoulder is doing a lot better. Still has some pain but not like he did.   Reviewed medications with patient and discussed compliance and pain relief measures  Discussed plans with patient for  ongoing care management follow up and provided patient with direct contact information for care management team  Reviewed scheduled/upcoming provider appointments including: appointment on 10/21/2019 for Cortisone injection. This has done well for the patient. He denies any new concerns at this time. He follows up as needed.   Pharmacy referral for review of current medications regimen and any questions related to cortisone injections  Patient Self Care Activities:   Patient verbalizes understanding of plan to address shoulder pain and discomfort and treatment options  Self administers medications as prescribed  Attends all scheduled provider appointments  Calls provider office for new concerns or questions  Unable to independently control shoulder pain as evidence of unrelieved pain in right shoulder with current treatment regimen of cream, hot/cold compresses, and taking "Aleve"  Please see past updates related to this goal by clicking on the "Past Updates" button in the selected goal         Patient verbalizes understanding of instructions provided today.   The care management team will reach out to the patient again over the next 60 to 90 days.   [name] RN, MSN, [facility] Mobile: [phone]

## 2020-01-30 DIAGNOSIS — M7061 Trochanteric bursitis, right hip: Secondary | ICD-10-CM | POA: Diagnosis not present

## 2020-02-13 ENCOUNTER — Telehealth: Payer: Self-pay | Admitting: Family Medicine

## 2020-02-13 DIAGNOSIS — M25559 Pain in unspecified hip: Secondary | ICD-10-CM

## 2020-02-13 NOTE — Telephone Encounter (Signed)
Copied from [place] [id number]. Topic: Referral - Request for Referral >> Jan 30, 2020  9:45 AM Alanda Slim E wrote: Has patient seen PCP for this complaint? yes *If NO, is insurance requiring patient see PCP for this issue before PCP can refer them? Referral for which specialty: orthopedic  Preferred provider/office:  Reason for referral: hip pain / would like referral asap due to severe pain

## 2020-02-14 ENCOUNTER — Other Ambulatory Visit: Payer: Self-pay

## 2020-02-14 ENCOUNTER — Ambulatory Visit (INDEPENDENT_AMBULATORY_CARE_PROVIDER_SITE_OTHER): Payer: Medicare Other | Admitting: Family Medicine

## 2020-02-14 ENCOUNTER — Encounter: Payer: Self-pay | Admitting: Family Medicine

## 2020-02-14 VITALS — BP 124/72 | HR 73 | Temp 99.0°F | Ht 62.21 in | Wt 171.2 lb

## 2020-02-14 DIAGNOSIS — R252 Cramp and spasm: Secondary | ICD-10-CM

## 2020-02-14 DIAGNOSIS — Z23 Encounter for immunization: Secondary | ICD-10-CM | POA: Diagnosis not present

## 2020-02-14 DIAGNOSIS — R109 Unspecified abdominal pain: Secondary | ICD-10-CM

## 2020-02-14 LAB — UA/M W/RFLX CULTURE, ROUTINE
Bilirubin, UA: NEGATIVE
Glucose, UA: NEGATIVE
Ketones, UA: NEGATIVE
Leukocytes,UA: NEGATIVE
Nitrite, UA: NEGATIVE
Protein,UA: NEGATIVE
RBC, UA: NEGATIVE
Specific Gravity, UA: 1.02 (ref 1.005–1.030)
Urobilinogen, Ur: 0.2 mg/dL (ref 0.2–1.0)
pH, UA: 6.5 (ref 5.0–7.5)

## 2020-02-14 MED ORDER — CYCLOBENZAPRINE HCL 5 MG PO TABS: 5.0000 mg | ORAL_TABLET | Freq: Every evening | ORAL | 1 refills | Status: DC | PRN

## 2020-02-14 NOTE — Progress Notes (Signed)
BP 124/72 (BP Location: Left Arm, Patient Position: Sitting, Cuff Size: Normal)   Pulse 73   Temp 99 F (37.2 C) (Oral)   Ht 5' 2.21" (1.58 m)   Wt 171 lb 3.2 oz (77.7 kg)   SpO2 95%   BMI 31.11 kg/m    Subjective:    Patient ID: Mitchell [name], male    DOB: 1955/10/23, 64 y.o.   MRN: [id number]  HPI: Mitchell [name] is a 64 y.o. male  Chief Complaint  Patient presents with  . Abdominal Cramping  . leg cramps  . finger cramps   CRAMPS- Has been having muscle cramps. He notes that he has pain in his hands and his side and his foot. He has cut out eating salt. Duration: months Pain: yes Severity: moderate  Quality:  Sharp and cramping Location: foot, side, hand   Bilateral:  no Onset: sudden Frequency: intermittent Time of  day:   at random Sudden unintentional leg jerking:   no Paresthesias:   no Decreased sensation:  no Weakness:   no Insomnia:   no Fatigue:   no Status: fluctuating  Relevant past medical, surgical, family and social history reviewed and updated as indicated. Interim medical history since our last visit reviewed. Allergies and medications reviewed and updated.  Review of Systems  Constitutional: Negative.   Respiratory: Negative.   Cardiovascular: Negative.   Gastrointestinal: Negative.   Musculoskeletal: Positive for myalgias. Negative for arthralgias, back pain, gait problem, joint swelling, neck pain and neck stiffness.  Skin: Negative.   Psychiatric/Behavioral: Negative.     Per HPI unless specifically indicated above     Objective:    BP 124/72 (BP Location: Left Arm, Patient Position: Sitting, Cuff Size: Normal)   Pulse 73   Temp 99 F (37.2 C) (Oral)   Ht 5' 2.21" (1.58 m)   Wt 171 lb 3.2 oz (77.7 kg)   SpO2 95%   BMI 31.11 kg/m   Wt Readings from Last 3 Encounters:  02/14/20 171 lb 3.2 oz (77.7 kg)  01/22/20 178 lb (80.7 kg)  09/23/19 178 lb (80.7 kg)    Physical Exam Vitals and nursing note reviewed.  Constitutional:       General: He is not in acute distress.    Appearance: Normal appearance. He is not ill-appearing, toxic-appearing or diaphoretic.  HENT:     Head: Normocephalic and atraumatic.     Right Ear: External ear normal.     Left Ear: External ear normal.     Nose: Nose normal.     Mouth/Throat:     Mouth: Mucous membranes are moist.     Pharynx: Oropharynx is clear.  Eyes:     General: No scleral icterus.       Right eye: No discharge.        Left eye: No discharge.     Extraocular Movements: Extraocular movements intact.     Conjunctiva/sclera: Conjunctivae normal.     Pupils: Pupils are equal, round, and reactive to light.  Cardiovascular:     Rate and Rhythm: Normal rate and regular rhythm.     Pulses: Normal pulses.     Heart sounds: Normal heart sounds. No murmur. No friction rub. No gallop.   Pulmonary:     Effort: Pulmonary effort is normal. No respiratory distress.     Breath sounds: Normal breath sounds. No stridor. No wheezing, rhonchi or rales.  Chest:     Chest wall: No tenderness.  Musculoskeletal:  General: Normal range of motion.     Cervical back: Normal range of motion and neck supple.  Skin:    General: Skin is warm and dry.     Capillary Refill: Capillary refill takes less than 2 seconds.     Coloration: Skin is not jaundiced or pale.     Findings: No bruising, erythema, lesion or rash.  Neurological:     General: No focal deficit present.     Mental Status: He is alert and oriented to person, place, and time. Mental status is at baseline.  Psychiatric:        Mood and Affect: Mood normal.        Behavior: Behavior normal.        Thought Content: Thought content normal.        Judgment: Judgment normal.     Results for orders placed or performed in visit on 09/23/19  Bayer DCA Hb A1c Waived  Result Value Ref Range   HB A1C (BAYER DCA - WAIVED) 6.0 <7.0 %  CBC with Differential OUT  Result Value Ref Range   WBC 8.8 3.4 - 10.8 x10E3/uL   RBC  4.73 4.14 - 5.80 x10E6/uL   Hemoglobin 15.5 13.0 - 17.7 g/dL   Hematocrit 44.2 37.5 - 51.0 %   MCV 93 79 - 97 fL   MCH 32.8 26.6 - 33.0 pg   MCHC 35.1 31.5 - 35.7 g/dL   RDW 13.3 11.6 - 15.4 %   Platelets 270 150 - 450 x10E3/uL   Neutrophils 50 Not Estab. %   Lymphs 40 Not Estab. %   Monocytes 7 Not Estab. %   Eos 2 Not Estab. %   Basos 1 Not Estab. %   Neutrophils Absolute 4.4 1.4 - 7.0 x10E3/uL   Lymphocytes Absolute 3.5 (H) 0.7 - 3.1 x10E3/uL   Monocytes Absolute 0.6 0.1 - 0.9 x10E3/uL   EOS (ABSOLUTE) 0.2 0.0 - 0.4 x10E3/uL   Basophils Absolute 0.1 0.0 - 0.2 x10E3/uL   Immature Granulocytes 0 Not Estab. %   Immature Grans (Abs) 0.0 0.0 - 0.1 x10E3/uL  Comp Met (CMET)  Result Value Ref Range   Glucose 93 65 - 99 mg/dL   BUN 19 8 - 27 mg/dL   Creatinine, Ser 1.39 (H) 0.76 - 1.27 mg/dL   GFR calc non Af Amer 53 (L) >59 mL/min/1.73   GFR calc Af Amer 61 >59 mL/min/1.73   BUN/Creatinine Ratio 14 10 - 24   Sodium 139 134 - 144 mmol/L   Potassium 4.4 3.5 - 5.2 mmol/L   Chloride 107 (H) 96 - 106 mmol/L   CO2 20 20 - 29 mmol/L   Calcium 9.3 8.6 - 10.2 mg/dL   Total Protein 7.1 6.0 - 8.5 g/dL   Albumin 4.1 3.8 - 4.8 g/dL   Globulin, Total 3.0 1.5 - 4.5 g/dL   Albumin/Globulin Ratio 1.4 1.2 - 2.2   Bilirubin Total <0.2 0.0 - 1.2 mg/dL   Alkaline Phosphatase 47 39 - 117 IU/L   AST 26 0 - 40 IU/L   ALT 32 0 - 44 IU/L  Lipid Panel w/o Chol/HDL Ratio OUT  Result Value Ref Range   Cholesterol, Total 172 100 - 199 mg/dL   Triglycerides 414 (H) 0 - 149 mg/dL   HDL 38 (L) >39 mg/dL   VLDL Cholesterol Cal 65 (H) 5 - 40 mg/dL   LDL Chol Calc (NIH) 69 0 - 99 mg/dL  Microalbumin, Urine Waived  Result Value Ref Range  Microalb, Ur Waived 10 0 - 19 mg/L   Creatinine, Urine Waived 300 10 - 300 mg/dL   Microalb/Creat Ratio <30 <30 mg/g  PSA  Result Value Ref Range   Prostate Specific Ag, Serum 0.7 0.0 - 4.0 ng/mL  TSH  Result Value Ref Range   TSH 3.340 0.450 - 4.500 uIU/mL  UA/M  w/rflx Culture, Routine   Specimen: Urine   URINE  Result Value Ref Range   Specific Gravity, UA >1.030 (H) 1.005 - 1.030   pH, UA 5.5 5.0 - 7.5   Color, UA Yellow Yellow   Appearance Ur Clear Clear   Leukocytes,UA Negative Negative   Protein,UA Negative Negative/Trace   Glucose, UA Negative Negative   Ketones, UA Trace (A) Negative   RBC, UA Negative Negative   Bilirubin, UA Negative Negative   Urobilinogen, Ur 0.2 0.2 - 1.0 mg/dL   Nitrite, UA Negative Negative      Assessment & Plan:   Problem List Items Addressed This Visit    None    Visit Diagnoses    Muscle cramp    -  Primary   Will check labs. Increase hydration. Flexeril PRN at bedtime for bad days. Follow up at [facility]. Call with any concerns.    Relevant Orders   Comprehensive metabolic panel   Flank pain       UA clear today.   Relevant Orders   UA/M w/rflx Culture, Routine       Follow up plan: Return As scheduled.

## 2020-02-15 LAB — COMPREHENSIVE METABOLIC PANEL
ALT: 23 IU/L (ref 0–44)
AST: 22 IU/L (ref 0–40)
Albumin/Globulin Ratio: 1.5 (ref 1.2–2.2)
Albumin: 4.1 g/dL (ref 3.8–4.8)
Alkaline Phosphatase: 42 IU/L — ABNORMAL LOW (ref 48–121)
BUN/Creatinine Ratio: 18 (ref 10–24)
BUN: 22 mg/dL (ref 8–27)
Bilirubin Total: 0.3 mg/dL (ref 0.0–1.2)
CO2: 21 mmol/L (ref 20–29)
Calcium: 9.3 mg/dL (ref 8.6–10.2)
Chloride: 102 mmol/L (ref 96–106)
Creatinine, Ser: 1.24 mg/dL (ref 0.76–1.27)
GFR calc Af Amer: 70 mL/min/{1.73_m2} (ref >59)
GFR calc non Af Amer: 61 mL/min/{1.73_m2} (ref >59)
Globulin, Total: 2.7 g/dL (ref 1.5–4.5)
Glucose: 105 mg/dL — ABNORMAL HIGH (ref 65–99)
Potassium: 4.9 mmol/L (ref 3.5–5.2)
Sodium: 137 mmol/L (ref 134–144)
Total Protein: 6.8 g/dL (ref 6.0–8.5)

## 2020-02-17 ENCOUNTER — Encounter: Payer: Self-pay | Admitting: Family Medicine

## 2020-02-25 DIAGNOSIS — H348322 Tributary (branch) retinal vein occlusion, left eye, stable: Secondary | ICD-10-CM | POA: Diagnosis not present

## 2020-02-25 DIAGNOSIS — H5203 Hypermetropia, bilateral: Secondary | ICD-10-CM | POA: Diagnosis not present

## 2020-02-25 DIAGNOSIS — H2513 Age-related nuclear cataract, bilateral: Secondary | ICD-10-CM | POA: Diagnosis not present

## 2020-03-24 ENCOUNTER — Other Ambulatory Visit: Payer: Self-pay

## 2020-03-24 ENCOUNTER — Emergency Department
Admission: EM | Admit: 2020-03-24 | Discharge: 2020-03-24 | Disposition: A | Discharge status: Home / Self Care | Payer: Medicare Other | Attending: Emergency Medicine | Admitting: Emergency Medicine

## 2020-03-24 ENCOUNTER — Emergency Department: Payer: Medicare Other

## 2020-03-24 DIAGNOSIS — Z79890 Hormone replacement therapy (postmenopausal): Secondary | ICD-10-CM | POA: Insufficient documentation

## 2020-03-24 DIAGNOSIS — E039 Hypothyroidism, unspecified: Secondary | ICD-10-CM | POA: Insufficient documentation

## 2020-03-24 DIAGNOSIS — Z7982 Long term (current) use of aspirin: Secondary | ICD-10-CM | POA: Diagnosis not present

## 2020-03-24 DIAGNOSIS — N50811 Right testicular pain: Secondary | ICD-10-CM

## 2020-03-24 DIAGNOSIS — Z87891 Personal history of nicotine dependence: Secondary | ICD-10-CM | POA: Insufficient documentation

## 2020-03-24 DIAGNOSIS — N433 Hydrocele, unspecified: Secondary | ICD-10-CM | POA: Diagnosis not present

## 2020-03-24 DIAGNOSIS — I10 Essential (primary) hypertension: Secondary | ICD-10-CM | POA: Diagnosis not present

## 2020-03-24 DIAGNOSIS — N5089 Other specified disorders of the male genital organs: Secondary | ICD-10-CM

## 2020-03-24 DIAGNOSIS — N5082 Scrotal pain: Secondary | ICD-10-CM | POA: Diagnosis present

## 2020-03-24 LAB — URINALYSIS, COMPLETE (UACMP) WITH MICROSCOPIC
Bacteria, UA: NONE SEEN
Bilirubin Urine: NEGATIVE
Glucose, UA: NEGATIVE mg/dL
Hgb urine dipstick: NEGATIVE
Ketones, ur: NEGATIVE mg/dL
Leukocytes,Ua: NEGATIVE
Nitrite: NEGATIVE
Protein, ur: NEGATIVE mg/dL
Specific Gravity, Urine: 1.018 (ref 1.005–1.030)
Squamous Epithelial / HPF: NONE SEEN (ref 0–5)
WBC, UA: NONE SEEN WBC/hpf (ref 0–5)
pH: 5 (ref 5.0–8.0)

## 2020-03-24 MED ORDER — NAPROXEN 500 MG PO TABS
500.0000 mg | ORAL_TABLET | Freq: Two times a day (BID) | ORAL | 0 refills | Status: DC
Start: 2020-03-24 — End: 2020-06-21

## 2020-03-24 NOTE — ED Provider Notes (Signed)
[facility] Emergency Department Provider Note   ____________________________________________   First MD Initiated Contact with Patient 03/24/20 [phone]     (approximate)  I have reviewed the triage vital signs and the nursing notes.   HISTORY  Chief Complaint Testicle Pain    HPI Mitchell [name] is a 64 y.o. male patient complain arrest scrotum pain for 1 day.  Patient is a recurrence issue.  Patient denies dysuria or urethral discharge.  Patient states this chronic condition present for couple years.  Patient did not pain discomfort normally last 2 to 3 days go away on its own.  Patient came in today because he just wanted no what is causative.  Rates his pain as 8/10.  Described pain as "achy".  No palliative measure complaint.         Past Medical History:  Diagnosis Date  . Anxiety   . Cancer ([place])    skin cancer  . Depression   . Epididymitis   . Hepatitis C    Tested positive, treated now told that he does not have it  . Hydrocele   . Hypertension   . IFG (impaired fasting glucose)   . Insomnia   . Kidney stone   . Schizophrenia [facility])     Patient Active Problem List   Diagnosis Date Noted  . Special screening for malignant neoplasms, colon   . Problems with swallowing and mastication   . Gastritis   . Acromioclavicular joint arthritis 04/13/2016  . Swollen testicle 08/14/2015  . BPH with obstruction/lower urinary tract symptoms 08/14/2015  . Hyperlipidemia 07/22/2015  . Hypothyroidism 07/17/2015  . Scrotal swelling 07/16/2015  . Hydrocele, right 07/08/2015  . Drug-induced erectile dysfunction 07/08/2015  . IFG (impaired fasting glucose)   . Schizophrenia ([place])   . Insomnia   . Anxiety   . Hypertension   . Depression   . Hepatitis C   . Developmental delay 01/02/2013    Past Surgical History:  Procedure Laterality Date  . ANKLE FRACTURE SURGERY    . COLONOSCOPY  2010  . COLONOSCOPY WITH PROPOFOL  06/07/2016   Procedure:  COLONOSCOPY WITH PROPOFOL;  Surgeon: [name], MD;  Location: [facility] ENDOSCOPY;  Service: Endoscopy;;  . ESOPHAGOGASTRODUODENOSCOPY (EGD) WITH PROPOFOL N/A 06/07/2016   Procedure: ESOPHAGOGASTRODUODENOSCOPY (EGD) WITH PROPOFOL;  Surgeon: [name], MD;  Location: [facility] ENDOSCOPY;  Service: Endoscopy;  Laterality: N/A;  . HERNIA REPAIR  2011   X 2    Prior to Admission medications   Medication Sig Start Date End Date Taking? Authorizing Provider  aspirin EC 81 MG tablet Take 1 tablet (81 mg total) by mouth daily. 03/21/19   Johnson, Megan P, DO  cyclobenzaprine (FLEXERIL) 5 MG tablet Take 1 tablet (5 mg total) by mouth at bedtime as needed for muscle spasms. 02/14/20   Johnson, Megan P, DO  FLOVENT HFA 220 MCG/ACT inhaler Inhale 2 puffs into the lungs 2 (two) times a day. 03/21/19   Johnson, Megan P, DO  levothyroxine (SYNTHROID) 50 MCG tablet TAKE 1 TABLET BY MOUTH  DAILY BEFORE BREAKFAST 11/20/19   Johnson, Megan P, DO  lisinopril (ZESTRIL) 5 MG tablet TAKE 1 AND 1/2 TABLETS BY  MOUTH DAILY 12/25/19   Johnson, Megan P, DO  loratadine (CLARITIN) 10 MG tablet Take 10 mg by mouth daily.    [provider]  Multiple Vitamin (MULTI-VITAMINS) TABS Take by mouth.    [provider]  naproxen (NAPROSYN) 500 MG tablet Take 1 tablet (500 mg  total) by mouth 2 (two) times daily with a meal. 03/24/20   Sable Feil, PA-C  naproxen sodium (ALEVE) 220 MG tablet Take 220 mg by mouth daily as needed (patient states he takes about 2 times a week).    [provider]  rosuvastatin (CRESTOR) 10 MG tablet TAKE 1 TABLET BY MOUTH  DAILY 12/25/19   Johnson, Megan P, DO  tadalafil (CIALIS) 20 MG tablet TAKE 1/2 TO 1 TABLET BY MOUTH EVERY OTHER DAY AS NEEDED FOR ERECTILE DYSFUNCTION 12/02/19   Park Liter P, DO    Allergies Haloperidol decanoate, Atorvastatin, and Codeine  Family History  Problem Relation Age of Onset  . Diabetes Mother   . Alzheimer's disease Father   . Diabetes Brother   .  Diabetes Maternal Grandfather     Social History Social History   Tobacco Use  . Smoking status: Former Smoker    Quit date: 07/06/1995    Years since quitting: 24.7  . Smokeless tobacco: Former Network engineer  . Vaping Use: Never used  Substance Use Topics  . Alcohol use: Not Currently    Comment: remote history, quit 20+ years ago (1 beer q 2-3 days)  . Drug use: No    Comment: remote history, quit 20= years ago    Review of Systems Constitutional: No fever/chills Eyes: No visual changes. ENT: No sore throat. Cardiovascular: Denies chest pain. Respiratory: Denies shortness of breath. Gastrointestinal: No abdominal pain.  No nausea, no vomiting.  No diarrhea.  No constipation. Genitourinary: Negative for dysuria.  Right scrotum pain. Musculoskeletal: Negative for back pain. Skin: Negative for rash. Neurological: Negative for headaches, focal weakness or numbness. Psychiatric:  Anxiety depression. Endocrine:  Hypertension. Allergic/Immunilogical: Haldol, atorvastatin, and codeine. ____________________________________________   PHYSICAL EXAM:  VITAL SIGNS: ED Triage Vitals  Enc Vitals Group     BP 03/24/20 0604 (!) 140/98     Pulse Rate 03/24/20 0604 74     Resp 03/24/20 0604 15     Temp 03/24/20 0604 98 F (36.7 C)     Temp src --      SpO2 03/24/20 0604 94 %     Weight 03/24/20 0602 171 lb 4.8 oz (77.7 kg)     Height 03/24/20 0602 5\' 2"  (1.575 m)     Head Circumference --      Peak Flow --      Pain Score 03/24/20 0602 8     Pain Loc --      Pain Edu? --      Excl. in [place]? --    Constitutional: Alert and oriented. Well appearing and in no acute distress. Cardiovascular: Normal rate, regular rhythm. Grossly normal heart sounds.  Good peripheral circulation. Respiratory: Normal respiratory effort.  No retractions. Lungs CTAB. Genitourinary: No obvious edema or erythema. Musculoskeletal: No lower extremity tenderness nor edema.  No joint  effusions. Neurologic:  Normal speech and language. No gross focal neurologic deficits are appreciated. No gait instability. Skin:  Skin is warm, dry and intact. No rash noted. Psychiatric: Mood and affect are normal. Speech and behavior are normal.  ____________________________________________   LABS (all labs ordered are listed, but only abnormal results are displayed)  Labs Reviewed  URINALYSIS, COMPLETE (UACMP) WITH MICROSCOPIC - Abnormal; Notable for the following components:      Result Value   Color, Urine YELLOW (*)    APPearance HAZY (*)    All other components within normal limits   ____________________________________________  EKG  ____________________________________________  RADIOLOGY  ED MD interpretation:    Official radiology report(s): US SCROTUM W/DOPPLER  Result Date: 03/24/2020 CLINICAL DATA:  Right testicular swelling and pain. EXAM: SCROTAL ULTRASOUND DOPPLER ULTRASOUND OF THE TESTICLES TECHNIQUE: Complete ultrasound examination of the testicles, epididymis, and other scrotal structures was performed. Color and spectral Doppler ultrasound were also utilized to evaluate blood flow to the testicles. COMPARISON:  12/11/2015. FINDINGS: Right testicle Measurements: 4.3 x 2.2 x 2.5 cm. No mass or microlithiasis visualized. Left testicle Measurements: 3.8 x 2.5 x 2.1 cm. No mass or microlithiasis visualized. Right epididymis:  7 mm cyst. Left epididymis: Tiny epididymal cysts with the largest measuring 6 mm. Hydrocele:  Bilateral hydroceles right side greater than left. Varicocele:  None visualized. Pulsed Doppler interrogation of both testes demonstrates normal low resistance arterial and venous waveforms bilaterally. IMPRESSION: 1.  No evidence of testicular mass or torsion. 2.  Bilateral hydroceles right side greater than left. 3.  Tiny epididymal cysts. Electronically Signed   By: Marcello Moores  Register   On: 03/24/2020 07:25     ____________________________________________   PROCEDURES  Procedure(s) performed (including Critical Care):  Procedures   ____________________________________________   INITIAL IMPRESSION / ASSESSMENT AND PLAN / ED COURSE  As part of my medical decision making, I reviewed the following data within the [place]     Patient presents with right scrotum pain and edema.  Discussed ultrasound results consistent with very mild right hydrocele.  Discussed negative UA findings.  Patient given discharge care instructions and a prescription for naproxen.  Patient advised follow-up with urology if condition persists or worsens.    Mitchell [name] was evaluated in Emergency Department on 03/24/2020 for the symptoms described in the history of present illness. He was evaluated in the context of the global COVID-19 pandemic, which necessitated consideration that the patient might be at risk for infection with the SARS-CoV-2 virus that causes COVID-19. Institutional protocols and algorithms that pertain to the evaluation of patients at risk for COVID-19 are in a state of rapid change based on information released by regulatory bodies including the CDC and federal and state organizations. These policies and algorithms were followed during the patient's care in the ED.       ____________________________________________   FINAL CLINICAL IMPRESSION(S) / ED DIAGNOSES  Final diagnoses:  Hydrocele in adult     ED Discharge Orders         Ordered    naproxen (NAPROSYN) 500 MG tablet  2 times daily with meals     Discontinue  Reprint     03/24/20 0917           Note:  This document was prepared using Dragon voice recognition software and may include unintentional dictation errors.    Sable Feil, PA-C 03/24/20 5009    Carrie Mew, MD 03/24/20 1500

## 2020-03-24 NOTE — ED Notes (Signed)
See triage note  States he noticed swelling to right testicle yesterday  Denies any trauma  Or urinary sxs' no fever

## 2020-03-24 NOTE — ED Triage Notes (Signed)
Patient coming from home for right testicle swelling and pain beginning yesterday. Patient reports hx of the same in the past, but it normally resolves by itself.

## 2020-04-05 ENCOUNTER — Other Ambulatory Visit: Payer: Self-pay | Admitting: Physician Assistant

## 2020-04-07 ENCOUNTER — Ambulatory Visit (INDEPENDENT_AMBULATORY_CARE_PROVIDER_SITE_OTHER): Payer: Medicare Other | Admitting: Family Medicine

## 2020-04-07 ENCOUNTER — Encounter: Payer: Self-pay | Admitting: Family Medicine

## 2020-04-07 ENCOUNTER — Other Ambulatory Visit: Payer: Self-pay

## 2020-04-07 VITALS — BP 115/73 | HR 79 | Temp 98.2°F | Ht 62.99 in | Wt 176.8 lb

## 2020-04-07 DIAGNOSIS — N138 Other obstructive and reflux uropathy: Secondary | ICD-10-CM

## 2020-04-07 DIAGNOSIS — B1920 Unspecified viral hepatitis C without hepatic coma: Secondary | ICD-10-CM

## 2020-04-07 DIAGNOSIS — I10 Essential (primary) hypertension: Secondary | ICD-10-CM | POA: Diagnosis not present

## 2020-04-07 DIAGNOSIS — Z0000 Encounter for general adult medical examination without abnormal findings: Secondary | ICD-10-CM

## 2020-04-07 DIAGNOSIS — R7301 Impaired fasting glucose: Secondary | ICD-10-CM | POA: Diagnosis not present

## 2020-04-07 DIAGNOSIS — F209 Schizophrenia, unspecified: Secondary | ICD-10-CM

## 2020-04-07 DIAGNOSIS — N401 Enlarged prostate with lower urinary tract symptoms: Secondary | ICD-10-CM

## 2020-04-07 DIAGNOSIS — E782 Mixed hyperlipidemia: Secondary | ICD-10-CM

## 2020-04-07 DIAGNOSIS — E038 Other specified hypothyroidism: Secondary | ICD-10-CM | POA: Diagnosis not present

## 2020-04-07 LAB — URINALYSIS, ROUTINE W REFLEX MICROSCOPIC
Bilirubin, UA: NEGATIVE
Glucose, UA: NEGATIVE
Ketones, UA: NEGATIVE
Leukocytes,UA: NEGATIVE
Nitrite, UA: NEGATIVE
Protein,UA: NEGATIVE
RBC, UA: NEGATIVE
Specific Gravity, UA: 1.025 (ref 1.005–1.030)
Urobilinogen, Ur: 0.2 mg/dL (ref 0.2–1.0)
pH, UA: 6 (ref 5.0–7.5)

## 2020-04-07 LAB — MICROALBUMIN, URINE WAIVED
Creatinine, Urine Waived: 200 mg/dL (ref 10–300)
Microalb, Ur Waived: 10 mg/L (ref 0–19)
Microalb/Creat Ratio: <30 mg/g (ref <30)

## 2020-04-07 LAB — BAYER DCA HB A1C WAIVED: HB A1C (BAYER DCA - WAIVED): 6.2 % (ref <7.0)

## 2020-04-07 MED ORDER — LISINOPRIL 5 MG PO TABS: 7.5000 mg | ORAL_TABLET | Freq: Every day | ORAL | 1 refills | Status: DC

## 2020-04-07 MED ORDER — FLOVENT HFA 220 MCG/ACT IN AERO: 2.0000 | INHALATION_SPRAY | Freq: Every day | RESPIRATORY_TRACT | 3 refills | Status: DC

## 2020-04-07 MED ORDER — ASPIRIN EC 81 MG PO TBEC: 81.0000 mg | DELAYED_RELEASE_TABLET | Freq: Every day | ORAL | 4 refills | Status: DC

## 2020-04-07 MED ORDER — TADALAFIL 20 MG PO TABS: ORAL_TABLET | ORAL | 9 refills | Status: DC

## 2020-04-07 NOTE — Progress Notes (Signed)
BP 115/73 (BP Location: Left Arm, Patient Position: Sitting, Cuff Size: Normal)   Pulse 79   Temp 98.2 F (36.8 C) (Oral)   Ht 5' 2.99" (1.6 m)   Wt 176 lb 12.8 oz (80.2 kg)   SpO2 95%   BMI 31.33 kg/m    Subjective:    Patient ID: Mitchell [name], male    DOB: [birth year], 64 y.o.   MRN: [id number]  HPI: Mitchell [name] is a 64 y.o. male presenting on 04/07/2020 for comprehensive medical examination. Current medical complaints include:  Impaired Fasting Glucose HbA1C:  Lab Results  Component Value Date   HGBA1C 6.2 04/07/2020   Duration of elevated blood sugar: chronic Polydipsia: no Polyuria: no Weight change: no Visual disturbance: no Glucose Monitoring: no    Accucheck frequency: Not Checking Family history of diabetes: yes  HYPOTHYROIDISM Thyroid control status:controlled Satisfied with current treatment? no Medication side effects: no Medication compliance: excellent compliance Recent dose adjustment:no Fatigue: no Cold intolerance: no Heat intolerance: no Weight gain: no Weight loss: no Constipation: no Diarrhea/loose stools: no Palpitations: no Lower extremity edema: no Anxiety/depressed mood: no  HYPERTENSION / HYPERLIPIDEMIA Satisfied with current treatment? yes Duration of hypertension: chronic BP monitoring frequency: not checking BP medication side effects: no Past BP meds:  Duration of hyperlipidemia: chronic Cholesterol medication side effects: no Cholesterol supplements: none Past cholesterol medications: atorvastain (lipitor) and rosuvastatin (crestor) Medication compliance: excellent compliance Aspirin: yes Recent stressors: no Recurrent headaches: no Visual changes: no Palpitations: no Dyspnea: no Chest pain: no Lower extremity edema: no Dizzy/lightheaded: no  He currently lives with: wife Interim Problems from his last visit: no  Depression Screen done today and results listed below:  Depression screen [facility] 2/9 01/22/2020 09/23/2019  03/21/2019 01/21/2019 01/15/2018  Decreased Interest 0 0 0 0 0  Down, Depressed, Hopeless 0 0 0 0 0  PHQ - 2 Score 0 0 0 0 0  Altered sleeping - 1 0 - 0  Tired, decreased energy - 1 1 - 0  Change in appetite - 0 0 - 0  Feeling bad or failure about yourself  - 0 0 - 0  Trouble concentrating - 0 0 - 0  Moving slowly or fidgety/restless - 0 0 - 0  Suicidal thoughts - 0 0 - 0  PHQ-9 Score - 2 1 - 0  Difficult doing work/chores - - Not difficult at all - Not difficult at all    Past Medical History:  Past Medical History:  Diagnosis Date  . Anxiety   . Cancer ([place])    skin cancer  . Depression   . Epididymitis   . Hepatitis C    Tested positive, treated now told that he does not have it  . Hydrocele   . Hypertension   . IFG (impaired fasting glucose)   . Insomnia   . Kidney stone   . Schizophrenia [facility])     Surgical History:  Past Surgical History:  Procedure Laterality Date  . ANKLE FRACTURE SURGERY    . COLONOSCOPY  2010  . COLONOSCOPY WITH PROPOFOL  06/07/2016   Procedure: COLONOSCOPY WITH PROPOFOL;  Surgeon: [name], MD;  Location: [facility] ENDOSCOPY;  Service: Endoscopy;;  . ESOPHAGOGASTRODUODENOSCOPY (EGD) WITH PROPOFOL N/A 06/07/2016   Procedure: ESOPHAGOGASTRODUODENOSCOPY (EGD) WITH PROPOFOL;  Surgeon: [name], MD;  Location: [facility] ENDOSCOPY;  Service: Endoscopy;  Laterality: N/A;  . HERNIA REPAIR  2011   X 2    Medications:  Current Outpatient Medications on File Prior to  Visit  Medication Sig  . cyclobenzaprine (FLEXERIL) 5 MG tablet Take 1 tablet (5 mg total) by mouth at bedtime as needed for muscle spasms.  Marland Kitchen levothyroxine (SYNTHROID) 50 MCG tablet TAKE 1 TABLET BY MOUTH  DAILY BEFORE BREAKFAST  . loratadine (CLARITIN) 10 MG tablet Take 10 mg by mouth daily.  . Multiple Vitamin (MULTI-VITAMINS) TABS Take by mouth.  . naproxen (NAPROSYN) 500 MG tablet Take 1 tablet (500 mg total) by mouth 2 (two) times daily with a meal.  . naproxen sodium (ALEVE) 220 MG tablet  Take 220 mg by mouth daily as needed (patient states he takes about 2 times a week).  . rosuvastatin (CRESTOR) 10 MG tablet TAKE 1 TABLET BY MOUTH  DAILY   No current facility-administered medications on file prior to visit.    Allergies:  Allergies  Allergen Reactions  . Haloperidol Decanoate Other (See Comments)    Muscle pain  . Atorvastatin Other (See Comments)    myalgias  . Codeine Nausea And Vomiting    Social History:  Social History   Socioeconomic History  . Marital status: Married    Spouse name: Otila Kluver  . Number of children: 2  . Years of education: 9  . Highest education level: 9th grade  Occupational History  . Occupation: retired   Tobacco Use  . Smoking status: Former Smoker    Quit date: 07/06/1995    Years since quitting: 24.7  . Smokeless tobacco: Former Network engineer  . Vaping Use: Never used  Substance and Sexual Activity  . Alcohol use: Not Currently    Comment: remote history, quit 20+ years ago (1 beer q 2-3 days)  . Drug use: No    Comment: remote history, quit 20= years ago  . Sexual activity: Yes  Other Topics Concern  . Not on file  Social History Narrative  . Not on file   Social Determinants of Health   Financial Resource Strain: Low Risk   . Difficulty of Paying Living Expenses: Not hard at all  Food Insecurity: No Food Insecurity  . Worried About Charity fundraiser in the Last Year: Never true  . Ran Out of Food in the Last Year: Never true  Transportation Needs: No Transportation Needs  . Lack of Transportation (Medical): No  . Lack of Transportation (Non-Medical): No  Physical Activity: Insufficiently Active  . Days of Exercise per Week: 5 days  . Minutes of Exercise per Session: 20 min  Stress: No Stress Concern Present  . Feeling of Stress : Not at all  Social Connections:   . Frequency of Communication with Friends and Family:   . Frequency of Social Gatherings with Friends and Family:   . Attends Religious  Services:   . Active Member of Clubs or Organizations:   . Attends Archivist Meetings:   Marland Kitchen Marital Status:   Intimate Partner Violence:   . Fear of Current or Ex-Partner:   . Emotionally Abused:   Marland Kitchen Physically Abused:   . Sexually Abused:    Social History   Tobacco Use  Smoking Status Former Smoker  . Quit date: 07/06/1995  . Years since quitting: 24.7  Smokeless Tobacco Former Systems developer   Social History   Substance and Sexual Activity  Alcohol Use Not Currently   Comment: remote history, quit 20+ years ago (1 beer q 2-3 days)    Family History:  Family History  Problem Relation Age of Onset  . Diabetes Mother   .  Alzheimer's disease Father   . Diabetes Brother   . Diabetes Maternal Grandfather     Past medical history, surgical history, medications, allergies, family history and social history reviewed with patient today and changes made to appropriate areas of the chart.   Review of Systems  Constitutional: Negative.   HENT: Positive for hearing loss. Negative for congestion, ear discharge, ear pain, nosebleeds, sinus pain, sore throat and tinnitus.   Eyes: Negative.   Respiratory: Negative.  Negative for stridor.   Cardiovascular: Negative.   Gastrointestinal: Positive for constipation and heartburn. Negative for abdominal pain, blood in stool, diarrhea, melena, nausea and vomiting.  Genitourinary: Negative.   Musculoskeletal: Positive for myalgias. Negative for back pain, falls, joint pain and neck pain.  Skin: Negative.   Neurological: Negative.   Endo/Heme/Allergies: Positive for environmental allergies. Negative for polydipsia. Does not bruise/bleed easily.  Psychiatric/Behavioral: Negative for depression, hallucinations, memory loss, substance abuse and suicidal ideas. The patient has insomnia. The patient is not nervous/anxious.     All other ROS negative except what is listed above and in the HPI.      Objective:    BP 115/73 (BP Location: Left  Arm, Patient Position: Sitting, Cuff Size: Normal)   Pulse 79   Temp 98.2 F (36.8 C) (Oral)   Ht 5' 2.99" (1.6 m)   Wt 176 lb 12.8 oz (80.2 kg)   SpO2 95%   BMI 31.33 kg/m   Wt Readings from Last 3 Encounters:  04/07/20 176 lb 12.8 oz (80.2 kg)  03/24/20 171 lb 4.8 oz (77.7 kg)  02/14/20 171 lb 3.2 oz (77.7 kg)    Physical Exam Vitals and nursing note reviewed.  Constitutional:      General: He is not in acute distress.    Appearance: Normal appearance. He is obese. He is not ill-appearing, toxic-appearing or diaphoretic.  HENT:     Head: Normocephalic and atraumatic.     Right Ear: Tympanic membrane, ear canal and external ear normal. There is no impacted cerumen.     Left Ear: Tympanic membrane, ear canal and external ear normal. There is no impacted cerumen.     Nose: Nose normal. No congestion or rhinorrhea.     Mouth/Throat:     Mouth: Mucous membranes are moist.     Pharynx: Oropharynx is clear. No oropharyngeal exudate or posterior oropharyngeal erythema.  Eyes:     General: No scleral icterus.       Right eye: No discharge.        Left eye: No discharge.     Extraocular Movements: Extraocular movements intact.     Conjunctiva/sclera: Conjunctivae normal.     Pupils: Pupils are equal, round, and reactive to light.  Neck:     Vascular: No carotid bruit.  Cardiovascular:     Rate and Rhythm: Normal rate and regular rhythm.     Pulses: Normal pulses.     Heart sounds: No murmur heard.  No friction rub. No gallop.   Pulmonary:     Effort: Pulmonary effort is normal. No respiratory distress.     Breath sounds: Normal breath sounds. No stridor. No wheezing, rhonchi or rales.  Chest:     Chest wall: No tenderness.  Abdominal:     General: Abdomen is flat. Bowel sounds are normal. There is no distension.     Palpations: Abdomen is soft. There is no mass.     Tenderness: There is no abdominal tenderness. There is no right CVA tenderness, left  CVA tenderness, guarding  or rebound.     Hernia: No hernia is present.  Genitourinary:    Comments: Genital exam deferred with shared decision making Musculoskeletal:        General: No swelling, tenderness, deformity or signs of injury.     Cervical back: Normal range of motion and neck supple. No rigidity. No muscular tenderness.     Right lower leg: No edema.     Left lower leg: No edema.  Lymphadenopathy:     Cervical: No cervical adenopathy.  Skin:    General: Skin is warm and dry.     Capillary Refill: Capillary refill takes less than 2 seconds.     Coloration: Skin is not jaundiced or pale.     Findings: No bruising, erythema, lesion or rash.  Neurological:     General: No focal deficit present.     Mental Status: He is alert and oriented to person, place, and time.     Cranial Nerves: No cranial nerve deficit.     Sensory: No sensory deficit.     Motor: No weakness.     Coordination: Coordination normal.     Gait: Gait normal.     Deep Tendon Reflexes: Reflexes normal.  Psychiatric:        Mood and Affect: Mood normal.        Behavior: Behavior normal.        Thought Content: Thought content normal.        Judgment: Judgment normal.     Results for orders placed or performed in visit on 04/07/20  Bayer DCA Hb A1c Waived  Result Value Ref Range   HB A1C (BAYER DCA - WAIVED) 6.2 <7.0 %  CBC with Differential/Platelet  Result Value Ref Range   WBC 8.2 3.4 - 10.8 x10E3/uL   RBC 4.74 4.14 - 5.80 x10E6/uL   Hemoglobin 15.0 13.0 - 17.7 g/dL   Hematocrit 43.6 37.5 - 51.0 %   MCV 92 79 - 97 fL   MCH 31.6 26.6 - 33.0 pg   MCHC 34.4 31 - 35 g/dL   RDW 13.3 11.6 - 15.4 %   Platelets 286 150 - 450 x10E3/uL   Neutrophils 56 Not Estab. %   Lymphs 33 Not Estab. %   Monocytes 8 Not Estab. %   Eos 3 Not Estab. %   Basos 0 Not Estab. %   Neutrophils Absolute 4.5 1 - 7 x10E3/uL   Lymphocytes Absolute 2.7 0 - 3 x10E3/uL   Monocytes Absolute 0.7 0 - 0 x10E3/uL   EOS (ABSOLUTE) 0.3 0.0 - 0.4 x10E3/uL    Basophils Absolute 0.0 0 - 0 x10E3/uL   Immature Granulocytes 0 Not Estab. %   Immature Grans (Abs) 0.0 0.0 - 0.1 x10E3/uL  Comprehensive metabolic panel  Result Value Ref Range   Glucose 128 (H) 65 - 99 mg/dL   BUN 20 8 - 27 mg/dL   Creatinine, Ser 1.15 0.76 - 1.27 mg/dL   GFR calc non Af Amer 66 >59 mL/min/1.73   GFR calc Af Amer 77 >59 mL/min/1.73   BUN/Creatinine Ratio 17 10 - 24   Sodium 143 134 - 144 mmol/L   Potassium 4.8 3.5 - 5.2 mmol/L   Chloride 108 (H) 96 - 106 mmol/L   CO2 21 20 - 29 mmol/L   Calcium 9.3 8.6 - 10.2 mg/dL   Total Protein 7.0 6.0 - 8.5 g/dL   Albumin 4.1 3.8 - 4.8 g/dL   Globulin, Total 2.9 1.5 -  4.5 g/dL   Albumin/Globulin Ratio 1.4 1.2 - 2.2   Bilirubin Total 0.3 0.0 - 1.2 mg/dL   Alkaline Phosphatase 44 (L) 48 - 121 IU/L   AST 21 0 - 40 IU/L   ALT 17 0 - 44 IU/L  HCV RNA quant  Result Value Ref Range   Hepatitis C Quantitation HCV Not Detected IU/mL   Test Information Comment   Lipid Panel w/o Chol/HDL Ratio  Result Value Ref Range   Cholesterol, Total 177 100 - 199 mg/dL   Triglycerides 405 (H) 0 - 149 mg/dL   HDL 40 >39 mg/dL   VLDL Cholesterol Cal 64 (H) 5 - 40 mg/dL   LDL Chol Calc (NIH) 73 0 - 99 mg/dL  Microalbumin, Urine Waived  Result Value Ref Range   Microalb, Ur Waived 10 0 - 19 mg/L   Creatinine, Urine Waived 200 10 - 300 mg/dL   Microalb/Creat Ratio <30 <30 mg/g  PSA  Result Value Ref Range   Prostate Specific Ag, Serum 0.6 0.0 - 4.0 ng/mL  TSH  Result Value Ref Range   TSH 3.540 0.450 - 4.500 uIU/mL  Urinalysis, Routine w reflex microscopic  Result Value Ref Range   Specific Gravity, UA 1.025 1.005 - 1.030   pH, UA 6.0 5.0 - 7.5   Color, UA Yellow Yellow   Appearance Ur Clear Clear   Leukocytes,UA Negative Negative   Protein,UA Negative Negative/Trace   Glucose, UA Negative Negative   Ketones, UA Negative Negative   RBC, UA Negative Negative   Bilirubin, UA Negative Negative   Urobilinogen, Ur 0.2 0.2 - 1.0  mg/dL   Nitrite, UA Negative Negative      Assessment & Plan:   Problem List Items Addressed This Visit      Cardiovascular and Mediastinum   Hypertension    Under good control on current regimen. Continue current regimen. Continue to monitor. Call with any concerns. Refills given. Labs drawn today.       Relevant Medications   tadalafil (CIALIS) 20 MG tablet   lisinopril (ZESTRIL) 5 MG tablet   aspirin EC 81 MG tablet   Other Relevant Orders   CBC with Differential/Platelet (Completed)   Comprehensive metabolic panel (Completed)   Microalbumin, Urine Waived (Completed)     Digestive   Hepatitis C    S/P treatment. Rechecking labs today. Await results. Treat as needed.       Relevant Orders   CBC with Differential/Platelet (Completed)   Comprehensive metabolic panel (Completed)   HCV RNA quant (Completed)     Endocrine   IFG (impaired fasting glucose)    Stable. Continue to monitor. Call with any concerns. Continue to monitor.       Relevant Orders   Bayer DCA Hb A1c Waived (Completed)   CBC with Differential/Platelet (Completed)   Comprehensive metabolic panel (Completed)   Hypothyroidism    Rechecking labs today. Await results. Treat as needed. Call with any concerns.       Relevant Orders   CBC with Differential/Platelet (Completed)   Comprehensive metabolic panel (Completed)   TSH (Completed)     Genitourinary   BPH with obstruction/lower urinary tract symptoms    Under good control on current regimen. Continue current regimen. Continue to monitor. Call with any concerns. Refills given. Labs drawn today.      Relevant Orders   CBC with Differential/Platelet (Completed)   Comprehensive metabolic panel (Completed)   PSA (Completed)   Urinalysis, Routine w reflex microscopic (Completed)  Other   Schizophrenia ([place])    Under good control on current regimen. Continue current regimen. Continue to monitor. Call with any concerns.        Relevant  Orders   CBC with Differential/Platelet (Completed)   Comprehensive metabolic panel (Completed)   Hyperlipidemia    Under good control on current regimen. Continue current regimen. Continue to monitor. Call with any concerns. Refills given. Labs drawn today.       Relevant Medications   tadalafil (CIALIS) 20 MG tablet   lisinopril (ZESTRIL) 5 MG tablet   aspirin EC 81 MG tablet   Other Relevant Orders   CBC with Differential/Platelet (Completed)   Comprehensive metabolic panel (Completed)   Lipid Panel w/o Chol/HDL Ratio (Completed)    Other Visit Diagnoses    Routine general medical examination at a health care facility    -  Primary   Vaccines up to date. Screening labs checked today. Colonoscopy up to date. Continue diet and exercise. Call with any concerns.        Discussed aspirin prophylaxis for myocardial infarction prevention and decision was made to continue ASA  LABORATORY TESTING:  Health maintenance labs ordered today as discussed above.   The natural history of prostate cancer and ongoing controversy regarding screening and potential treatment outcomes of prostate cancer has been discussed with the patient. The meaning of a false positive PSA and a false negative PSA has been discussed. He indicates understanding of the limitations of this screening test and wishes  to proceed with screening PSA testing.   IMMUNIZATIONS:   - Tdap: Tetanus vaccination status reviewed: last tetanus booster within 10 years. - Influenza: Postponed to flu season - Pneumovax: Up to date - Prevnar: Up to date - COVID: up to date  SCREENING: - Colonoscopy: Up to date  Discussed with patient purpose of the colonoscopy is to detect colon cancer at curable precancerous or early stages   PATIENT COUNSELING:    Sexuality: Discussed sexually transmitted diseases, partner selection, use of condoms, avoidance of unintended pregnancy  and contraceptive alternatives.   Advised to avoid  cigarette smoking.  I discussed with the patient that most people either abstain from alcohol or drink within safe limits (<=14/week and <=4 drinks/occasion for males, <=7/weeks and <= 3 drinks/occasion for females) and that the risk for alcohol disorders and other health effects rises proportionally with the number of drinks per week and how often a drinker exceeds daily limits.  Discussed cessation/primary prevention of drug use and availability of treatment for abuse.   Diet: Encouraged to adjust caloric intake to maintain  or achieve ideal body weight, to reduce intake of dietary saturated fat and total fat, to limit sodium intake by avoiding high sodium foods and not adding table salt, and to maintain adequate dietary potassium and calcium preferably from fresh fruits, vegetables, and low-fat dairy products.    stressed the importance of regular exercise  Injury prevention: Discussed safety belts, safety helmets, smoke detector, smoking near bedding or upholstery.   Dental health: Discussed importance of regular tooth brushing, flossing, and dental visits.   Follow up plan: NEXT PREVENTATIVE PHYSICAL DUE IN 1 YEAR. Return in about 6 months (around 10/08/2020).

## 2020-04-07 NOTE — Patient Instructions (Addendum)
Co-Q-10- medicine that helps with aches and pains  Health Maintenance After Age 65 After age 34, you are at a higher risk for certain long-term diseases and infections as well as injuries from falls. Falls are a major cause of broken bones and head injuries in people who are older than age 34. Getting regular preventive care can help to keep you healthy and well. Preventive care includes getting regular testing and making lifestyle changes as recommended by your health care provider. Talk with your health care provider about:  Which screenings and tests you should have. A screening is a test that checks for a disease when you have no symptoms.  A diet and exercise plan that is right for you. What should I know about screenings and tests to prevent falls? Screening and testing are the best ways to find a health problem early. Early diagnosis and treatment give you the best chance of managing medical conditions that are common after age 65. Certain conditions and lifestyle choices may make you more likely to have a fall. Your health care provider may recommend:  Regular vision checks. Poor vision and conditions such as cataracts can make you more likely to have a fall. If you wear glasses, make sure to get your prescription updated if your vision changes.  Medicine review. Work with your health care provider to regularly review all of the medicines you are taking, including over-the-counter medicines. Ask your health care provider about any side effects that may make you more likely to have a fall. Tell your health care provider if any medicines that you take make you feel dizzy or sleepy.  Osteoporosis screening. Osteoporosis is a condition that causes the bones to get weaker. This can make the bones weak and cause them to break more easily.  Blood pressure screening. Blood pressure changes and medicines to control blood pressure can make you feel dizzy.  Strength and balance checks. Your health  care provider may recommend certain tests to check your strength and balance while standing, walking, or changing positions.  Foot health exam. Foot pain and numbness, as well as not wearing proper footwear, can make you more likely to have a fall.  Depression screening. You may be more likely to have a fall if you have a fear of falling, feel emotionally low, or feel unable to do activities that you used to do.  Alcohol use screening. Using too much alcohol can affect your balance and may make you more likely to have a fall. What actions can I take to lower my risk of falls? General instructions  Talk with your health care provider about your risks for falling. Tell your health care provider if: ? You fall. Be sure to tell your health care provider about all falls, even ones that seem minor. ? You feel dizzy, sleepy, or off-balance.  Take over-the-counter and prescription medicines only as told by your health care provider. These include any supplements.  Eat a healthy diet and maintain a healthy weight. A healthy diet includes low-fat dairy products, low-fat (lean) meats, and fiber from whole grains, beans, and lots of fruits and vegetables. Home safety  Remove any tripping hazards, such as rugs, cords, and clutter.  Install safety equipment such as grab bars in bathrooms and safety rails on stairs.  Keep rooms and walkways well-lit. Activity   Follow a regular exercise program to stay fit. This will help you maintain your balance. Ask your health care provider what types of exercise are appropriate  for you.  If you need a cane or walker, use it as recommended by your health care provider.  Wear supportive shoes that have nonskid soles. Lifestyle  Do not drink alcohol if your health care provider tells you not to drink.  If you drink alcohol, limit how much you have: ? 0-1 drink a day for women. ? 0-2 drinks a day for men.  Be aware of how much alcohol is in your drink. In  the [place] one drink equals one typical bottle of beer (12 oz), one-half glass of wine (5 oz), or one shot of hard liquor (1 oz).  Do not use any products that contain nicotine or tobacco, such as cigarettes and e-cigarettes. If you need help quitting, ask your health care provider. Summary  Having a healthy lifestyle and getting preventive care can help to protect your health and wellness after age 27.  Screening and testing are the best way to find a health problem early and help you avoid having a fall. Early diagnosis and treatment give you the best chance for managing medical conditions that are more common for people who are older than age 21.  Falls are a major cause of broken bones and head injuries in people who are older than age 28. Take precautions to prevent a fall at home.  Work with your health care provider to learn what changes you can make to improve your health and wellness and to prevent falls. This information is not intended to replace advice given to you by your health care provider. Make sure you discuss any questions you have with your health care provider. Document Revised: 12/27/2018 Document Reviewed: 07/19/2017 Elsevier Patient Education  2020 [employer].

## 2020-04-09 ENCOUNTER — Encounter: Payer: Self-pay | Admitting: Family Medicine

## 2020-04-09 LAB — CBC WITH DIFFERENTIAL/PLATELET
Basophils Absolute: 0 10*3/uL (ref 0.0–0.2)
Basos: 0 %
EOS (ABSOLUTE): 0.3 10*3/uL (ref 0.0–0.4)
Eos: 3 %
Hematocrit: 43.6 % (ref 37.5–51.0)
Hemoglobin: 15 g/dL (ref 13.0–17.7)
Immature Grans (Abs): 0 10*3/uL (ref 0.0–0.1)
Immature Granulocytes: 0 %
Lymphocytes Absolute: 2.7 10*3/uL (ref 0.7–3.1)
Lymphs: 33 %
MCH: 31.6 pg (ref 26.6–33.0)
MCHC: 34.4 g/dL (ref 31.5–35.7)
MCV: 92 fL (ref 79–97)
Monocytes Absolute: 0.7 10*3/uL (ref 0.1–0.9)
Monocytes: 8 %
Neutrophils Absolute: 4.5 10*3/uL (ref 1.4–7.0)
Neutrophils: 56 %
Platelets: 286 10*3/uL (ref 150–450)
RBC: 4.74 x10E6/uL (ref 4.14–5.80)
RDW: 13.3 % (ref 11.6–15.4)
WBC: 8.2 10*3/uL (ref 3.4–10.8)

## 2020-04-09 LAB — LIPID PANEL W/O CHOL/HDL RATIO
Cholesterol, Total: 177 mg/dL (ref 100–199)
HDL: 40 mg/dL (ref >39)
LDL Chol Calc (NIH): 73 mg/dL (ref 0–99)
Triglycerides: 405 mg/dL — ABNORMAL HIGH (ref 0–149)
VLDL Cholesterol Cal: 64 mg/dL — ABNORMAL HIGH (ref 5–40)

## 2020-04-09 LAB — PSA: Prostate Specific Ag, Serum: 0.6 ng/mL (ref 0.0–4.0)

## 2020-04-09 LAB — COMPREHENSIVE METABOLIC PANEL
ALT: 17 IU/L (ref 0–44)
AST: 21 IU/L (ref 0–40)
Albumin/Globulin Ratio: 1.4 (ref 1.2–2.2)
Albumin: 4.1 g/dL (ref 3.8–4.8)
Alkaline Phosphatase: 44 IU/L — ABNORMAL LOW (ref 48–121)
BUN/Creatinine Ratio: 17 (ref 10–24)
BUN: 20 mg/dL (ref 8–27)
Bilirubin Total: 0.3 mg/dL (ref 0.0–1.2)
CO2: 21 mmol/L (ref 20–29)
Calcium: 9.3 mg/dL (ref 8.6–10.2)
Chloride: 108 mmol/L — ABNORMAL HIGH (ref 96–106)
Creatinine, Ser: 1.15 mg/dL (ref 0.76–1.27)
GFR calc Af Amer: 77 mL/min/{1.73_m2} (ref >59)
GFR calc non Af Amer: 66 mL/min/{1.73_m2} (ref >59)
Globulin, Total: 2.9 g/dL (ref 1.5–4.5)
Glucose: 128 mg/dL — ABNORMAL HIGH (ref 65–99)
Potassium: 4.8 mmol/L (ref 3.5–5.2)
Sodium: 143 mmol/L (ref 134–144)
Total Protein: 7 g/dL (ref 6.0–8.5)

## 2020-04-09 LAB — HCV RNA QUANT: Hepatitis C Quantitation: NOT DETECTED IU/mL

## 2020-04-09 LAB — TSH: TSH: 3.54 u[IU]/mL (ref 0.450–4.500)

## 2020-04-09 NOTE — Assessment & Plan Note (Signed)
Under good control on current regimen. Continue current regimen. Continue to monitor. Call with any concerns. Refills given. Labs drawn today.   

## 2020-04-09 NOTE — Assessment & Plan Note (Signed)
Rechecking labs today. Await results. Treat as needed. Call with any concerns.  

## 2020-04-09 NOTE — Assessment & Plan Note (Signed)
Under good control on current regimen. Continue current regimen. Continue to monitor. Call with any concerns.   

## 2020-04-09 NOTE — Assessment & Plan Note (Signed)
Stable. Continue to monitor. Call with any concerns. Continue to monitor.  ?

## 2020-04-09 NOTE — Assessment & Plan Note (Signed)
S/P treatment. Rechecking labs today. Await results. Treat as needed.

## 2020-04-10 ENCOUNTER — Encounter: Payer: Self-pay | Admitting: Family Medicine

## 2020-04-12 ENCOUNTER — Other Ambulatory Visit: Payer: Self-pay | Admitting: Physician Assistant

## 2020-04-14 ENCOUNTER — Telehealth: Payer: Self-pay

## 2020-04-14 ENCOUNTER — Telehealth: Payer: Self-pay | Admitting: General Practice

## 2020-04-14 NOTE — Telephone Encounter (Signed)
  Chronic Care Management   Outreach Note  04/14/2020 Name: Mitchell [name] MRN: [id number] DOB: 04/09/1956  Referred by: Valerie Roys, DO Reason for referral : Appointment [facility] outreach follow up appointment )   An unsuccessful telephone outreach was attempted today. The patient was referred to the case management team for assistance with care management and care coordination.   Follow Up Plan: A HIPPA compliant phone message was left for the patient providing contact information and requesting a return call.   [name] RN, MSN, [facility] Mobile: [phone]

## 2020-04-15 ENCOUNTER — Telehealth: Payer: Self-pay

## 2020-04-15 NOTE — Chronic Care Management (AMB) (Signed)
  Care Management   Note  04/15/2020 Name: Mitchell [name] MRN: [id number] DOB: [birth year]  Mitchell [name] is a 64 y.o. year old male who is a primary care patient of Valerie Roys, DO and is actively engaged with the care management team. I reached out to Odelia Gage by phone today to assist with re-scheduling a follow up visit with the RN Case Manager  Follow up plan: Unsuccessful telephone outreach attempt made. A HIPPA compliant phone message was left for the patient providing contact information and requesting a return call.  The care management team will reach out to the patient again over the next 7 days.  If patient returns call to provider office, please advise to call [facility]  at [place], [place], [place], [place] [address] Direct Dial: [phone] [email or web address]: [email or web address]

## 2020-04-15 NOTE — Telephone Encounter (Signed)
Pt has been r/s for 05/20/2020 ° °

## 2020-04-15 NOTE — Chronic Care Management (AMB) (Signed)
  Care Management   Note  04/15/2020 Name: Mitchell [name] MRN: [id number] DOB: 06/26/1956  Mitchell [name] is a 64 y.o. year old male who is a primary care patient of Valerie Roys, DO and is actively engaged with the care management team. I reached out to Odelia Gage by phone today to assist with re-scheduling a follow up visit with the RN Case Manager  Follow up plan: Telephone appointment with care management team member scheduled for:05/20/2020  [name], [place], [place], [place] [address] Direct Dial: [phone] [email or web address]

## 2020-04-19 ENCOUNTER — Other Ambulatory Visit: Payer: Self-pay | Admitting: Physician Assistant

## 2020-04-26 ENCOUNTER — Other Ambulatory Visit: Payer: Self-pay | Admitting: Physician Assistant

## 2020-05-03 ENCOUNTER — Other Ambulatory Visit: Payer: Self-pay | Admitting: Family Medicine

## 2020-05-20 ENCOUNTER — Telehealth: Payer: Medicare Other | Admitting: General Practice

## 2020-05-20 ENCOUNTER — Ambulatory Visit: Payer: Self-pay | Admitting: General Practice

## 2020-05-20 DIAGNOSIS — F331 Major depressive disorder, recurrent, moderate: Secondary | ICD-10-CM

## 2020-05-20 DIAGNOSIS — I10 Essential (primary) hypertension: Secondary | ICD-10-CM

## 2020-05-20 DIAGNOSIS — E782 Mixed hyperlipidemia: Secondary | ICD-10-CM

## 2020-05-20 DIAGNOSIS — G8929 Other chronic pain: Secondary | ICD-10-CM

## 2020-05-20 NOTE — Chronic Care Management (AMB) (Signed)
Chronic Care Management   Follow Up Note   05/20/2020 Name: Mitchell [name] MRN: [id number] DOB: 01/17/1956  Referred by: Valerie Roys, DO Reason for referral : Chronic Care Management (RNCM Follow up call for chronic disease management and care coordination needs)   Mitchell [name] is a 64 y.o. year old male who is a primary care patient of Valerie Roys, DO. The CCM team was consulted for assistance with chronic disease management and care coordination needs.    Review of patient status, including review of consultants reports, relevant laboratory and other test results, and collaboration with appropriate care team members and the patient's provider was performed as part of comprehensive patient evaluation and provision of chronic care management services.    SDOH (Social Determinants of Health) assessments performed: Yes See Care Plan activities for detailed interventions related to [facility])     Outpatient Encounter Medications as of 05/20/2020  Medication Sig  . aspirin EC 81 MG tablet Take 1 tablet (81 mg total) by mouth daily.  . cyclobenzaprine (FLEXERIL) 5 MG tablet Take 1 tablet (5 mg total) by mouth at bedtime as needed for muscle spasms.  Marland Kitchen FLOVENT HFA 220 MCG/ACT inhaler Inhale 2 puffs into the lungs daily.  Marland Kitchen levothyroxine (SYNTHROID) 50 MCG tablet TAKE 1 TABLET BY MOUTH  DAILY BEFORE BREAKFAST  . lisinopril (ZESTRIL) 5 MG tablet Take 1.5 tablets (7.5 mg total) by mouth daily.  Marland Kitchen loratadine (CLARITIN) 10 MG tablet Take 10 mg by mouth daily.  . Multiple Vitamin (MULTI-VITAMINS) TABS Take by mouth.  . naproxen (NAPROSYN) 500 MG tablet Take 1 tablet (500 mg total) by mouth 2 (two) times daily with a meal.  . naproxen sodium (ALEVE) 220 MG tablet Take 220 mg by mouth daily as needed (patient states he takes about 2 times a week).  . rosuvastatin (CRESTOR) 10 MG tablet TAKE 1 TABLET BY MOUTH  DAILY  . tadalafil (CIALIS) 20 MG tablet TAKE 1/2 TO 1 TABLET BY MOUTH EVERY OTHER DAY AS  NEEDED FOR ERECTILE DYSFUNCTION   No facility-administered encounter medications on file as of 05/20/2020.     Objective:  BP Readings from Last 3 Encounters:  04/07/20 115/73  03/24/20 (!) 140/98  02/14/20 124/72    Goals Addressed            This Visit's Progress   . RNCM: I don't really do anything special to take care of myself       Current Barriers:  . Chronic Disease Management support, education, and care coordination needs related to HTN, HLD, and Depression  Clinical Goal(s) related to HTN, HLD, and Depression:  Over the next 60 days, patient will:  . Work with the care management team to address educational, disease management, and care coordination needs  . Begin or continue self health monitoring activities as directed today Measure and record blood pressure 2 times per week and exercise at least 20 minutes 3 or more times a week . Call provider office for new or worsened signs and symptoms Blood pressure findings outside established parameters, New or worsened symptom related to HTN, HLD and depression, and worsening or unrelieved shoulder pain . Call care management team with questions or concerns . Verbalize basic understanding of patient centered plan of care established today  Interventions related to HTN, HLD, and Depression:  . Evaluation of current treatment plans and patient's adherence to plan as established by provider. 05-20-2020: The patient is doing well and denies any new concerns. States  he has been feeling great.  . Assessed patient understanding of disease states.  The patient verbalized he understands about his conditions and does what the doctor tells him to do. He denies any issues. Has not been taking his blood pressure at home but will start to do so.  . Assessed patient's education and care coordination needs.  No new needs at this time.  The patient knows resources are available to help as needed.  . Provided disease specific education to patient.  Education given on the benefits of checking and recording blood pressures at least 2 times a week.  The patient verbalized he has not taken it in a while but will start doing that. The patient verbalized that his wife makes sure he does not eat foods with salts or fats. She even takes the cholesterol out of eggs. The patient says she helps him stay healthy. Praised for healthy dietary habits. The patient is more active since now that the weather is better. He is planning on working in his yard and planting [employer] this weekend. 05-20-2020: The patient is enjoying the cooler weather. Is working in the yard and denies any issues with following dietary restrictions.  . Evaluation of upcoming appointments. The patient does not have any upcoming appointments to see the pcp but calls as needed. The patient did want to make sure he wrote the [facility] number down again as he had misplaced it. Education and support given.  Nash Dimmer with appropriate clinical care team members regarding patient needs.  The patient denies any needs for CCM team LCSW or pharmacist at this time but knows they are available.   Patient Self Care Activities related to HTN, HLD, and Depression:  . Patient is unable to independently self-manage chronic health conditions  Please see past updates related to this goal by clicking on the "Past Updates" button in the selected goal      . RNCM: I have this shoulder pain. 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 Literacy barriers  Nurse Case Manager Clinical Goal(s):  Marland Kitchen Over the next 120 days, patient will verbalize understanding of plan for treatment options related to unrelieved pain in right shoulder . Over the next 120 days, patient will attend all scheduled medical appointments: Upcoming appointment Monday for a cortisone shot in his right  shoulder per the patient . Over the next 120 days, patient will demonstrate improved adherence to prescribed treatment plan for right shoulder  as evidenced bydecreased pain and discomfort in the right shoulder  Interventions:  . Evaluation of current treatment plan related to right shoulder pain and patient's adherence to plan as established by provider. . Provided education to patient re: cortisone shots and effectiveness. The patient had a shot a couple of months ago and states that his shoulder is doing a lot better. Still has some pain but not like he did. 05-20-2020: The patient states the pain comes and goes. Denies any issues and says it does not prevent him from doing his normal activity.  . Reviewed medications with patient and discussed compliance and pain relief measures . Discussed plans with patient for ongoing care management follow up and provided patient with direct contact information for care management team . Reviewed scheduled/upcoming provider appointments including: appointment on 10/21/2019 for Cortisone injection. This has done well for  the patient. He denies any new concerns at this time. He follows up as needed.  . Pharmacy referral for review of current medications regimen and any questions related to cortisone injections  Patient Self Care Activities:  . Patient verbalizes understanding of plan to address shoulder pain and discomfort and treatment options . Self administers medications as prescribed . Attends all scheduled provider appointments . Calls provider office for new concerns or questions . Unable to independently control shoulder pain as evidence of unrelieved pain in right shoulder with current treatment regimen of cream, hot/cold compresses, and taking "Aleve"  Please see past updates related to this goal by clicking on the "Past Updates" button in the selected goal          Plan:   Telephone follow up appointment with care management team member  scheduled for: 07-29-2020 at 330 pm   [place], MSN, [facility] Mobile: [phone]

## 2020-05-20 NOTE — Patient Instructions (Signed)
Visit Information  Goals Addressed            This Visit's Progress   . RNCM: I don't really do anything special to take care of myself       Current Barriers:  . Chronic Disease Management support, education, and care coordination needs related to HTN, HLD, and Depression  Clinical Goal(s) related to HTN, HLD, and Depression:  Over the next 60 days, patient will:  . Work with the care management team to address educational, disease management, and care coordination needs  . Begin or continue self health monitoring activities as directed today Measure and record blood pressure 2 times per week and exercise at least 20 minutes 3 or more times a week . Call provider office for new or worsened signs and symptoms Blood pressure findings outside established parameters, New or worsened symptom related to HTN, HLD and depression, and worsening or unrelieved shoulder pain . Call care management team with questions or concerns . Verbalize basic understanding of patient centered plan of care established today  Interventions related to HTN, HLD, and Depression:  . Evaluation of current treatment plans and patient's adherence to plan as established by provider. 05-20-2020: The patient is doing well and denies any new concerns. States he has been feeling great.  . Assessed patient understanding of disease states.  The patient verbalized he understands about his conditions and does what the doctor tells him to do. He denies any issues. Has not been taking his blood pressure at home but will start to do so.  . Assessed patient's education and care coordination needs.  No new needs at this time.  The patient knows resources are available to help as needed.  . Provided disease specific education to patient. Education given on the benefits of checking and recording blood pressures at least 2 times a week.  The patient verbalized he has not taken it in a while but will start doing that. The patient verbalized  that his wife makes sure he does not eat foods with salts or fats. She even takes the cholesterol out of eggs. The patient says she helps him stay healthy. Praised for healthy dietary habits. The patient is more active since now that the weather is better. He is planning on working in his yard and planting [employer] this weekend. 05-20-2020: The patient is enjoying the cooler weather. Is working in the yard and denies any issues with following dietary restrictions.  . Evaluation of upcoming appointments. The patient does not have any upcoming appointments to see the pcp but calls as needed. The patient did want to make sure he wrote the [facility] number down again as he had misplaced it. Education and support given.  Nash Dimmer with appropriate clinical care team members regarding patient needs.  The patient denies any needs for CCM team LCSW or pharmacist at this time but knows they are available.   Patient Self Care Activities related to HTN, HLD, and Depression:  . Patient is unable to independently self-manage chronic health conditions  Please see past updates related to this goal by clicking on the "Past Updates" button in the selected goal      . RNCM: I have this shoulder pain. 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 Literacy barriers  Nurse Case Manager Clinical Goal(s):  .  Over the next 120 days, patient will verbalize understanding of plan for treatment options related to unrelieved pain in right shoulder . Over the next 120 days, patient will attend all scheduled medical appointments: Upcoming appointment Monday for a cortisone shot in his right shoulder per the patient . Over the next 120 days, patient will demonstrate improved adherence to prescribed treatment plan for right shoulder  as evidenced bydecreased pain and discomfort in the right  shoulder  Interventions:  . Evaluation of current treatment plan related to right shoulder pain and patient's adherence to plan as established by provider. . Provided education to patient re: cortisone shots and effectiveness. The patient had a shot a couple of months ago and states that his shoulder is doing a lot better. Still has some pain but not like he did. 05-20-2020: The patient states the pain comes and goes. Denies any issues and says it does not prevent him from doing his normal activity.  . Reviewed medications with patient and discussed compliance and pain relief measures . Discussed plans with patient for ongoing care management follow up and provided patient with direct contact information for care management team . Reviewed scheduled/upcoming provider appointments including: appointment on 10/21/2019 for Cortisone injection. This has done well for the patient. He denies any new concerns at this time. He follows up as needed.  . Pharmacy referral for review of current medications regimen and any questions related to cortisone injections  Patient Self Care Activities:  . Patient verbalizes understanding of plan to address shoulder pain and discomfort and treatment options . Self administers medications as prescribed . Attends all scheduled provider appointments . Calls provider office for new concerns or questions . Unable to independently control shoulder pain as evidence of unrelieved pain in right shoulder with current treatment regimen of cream, hot/cold compresses, and taking "Aleve"  Please see past updates related to this goal by clicking on the "Past Updates" button in the selected goal         Patient verbalizes understanding of instructions provided today.   Telephone follow up appointment with care management team member scheduled for: 07-29-2020 at 0330 pm  [place], MSN, [facility] Mobile: [phone]

## 2020-06-18 DIAGNOSIS — M25462 Effusion, left knee: Secondary | ICD-10-CM | POA: Diagnosis not present

## 2020-06-18 DIAGNOSIS — Z7689 Persons encountering health services in other specified circumstances: Secondary | ICD-10-CM | POA: Diagnosis not present

## 2020-06-18 DIAGNOSIS — M7042 Prepatellar bursitis, left knee: Secondary | ICD-10-CM | POA: Diagnosis not present

## 2020-06-21 ENCOUNTER — Emergency Department: Payer: Medicare Other

## 2020-06-21 ENCOUNTER — Other Ambulatory Visit: Payer: Self-pay

## 2020-06-21 ENCOUNTER — Emergency Department
Admission: EM | Admit: 2020-06-21 | Discharge: 2020-06-21 | Disposition: A | Discharge status: Home / Self Care | Payer: Medicare Other | Attending: Emergency Medicine | Admitting: Emergency Medicine

## 2020-06-21 ENCOUNTER — Telehealth: Payer: Self-pay | Admitting: Emergency Medicine

## 2020-06-21 DIAGNOSIS — Z7982 Long term (current) use of aspirin: Secondary | ICD-10-CM | POA: Insufficient documentation

## 2020-06-21 DIAGNOSIS — M7989 Other specified soft tissue disorders: Secondary | ICD-10-CM | POA: Diagnosis not present

## 2020-06-21 DIAGNOSIS — I10 Essential (primary) hypertension: Secondary | ICD-10-CM | POA: Diagnosis not present

## 2020-06-21 DIAGNOSIS — Z87891 Personal history of nicotine dependence: Secondary | ICD-10-CM | POA: Insufficient documentation

## 2020-06-21 DIAGNOSIS — Z79899 Other long term (current) drug therapy: Secondary | ICD-10-CM | POA: Insufficient documentation

## 2020-06-21 DIAGNOSIS — M25562 Pain in left knee: Secondary | ICD-10-CM | POA: Diagnosis not present

## 2020-06-21 DIAGNOSIS — R52 Pain, unspecified: Secondary | ICD-10-CM

## 2020-06-21 DIAGNOSIS — Z79890 Hormone replacement therapy (postmenopausal): Secondary | ICD-10-CM | POA: Diagnosis not present

## 2020-06-21 DIAGNOSIS — E039 Hypothyroidism, unspecified: Secondary | ICD-10-CM | POA: Insufficient documentation

## 2020-06-21 MED ORDER — TRAMADOL HCL 50 MG PO TABS: 50.0000 mg | ORAL_TABLET | Freq: Four times a day (QID) | ORAL | 0 refills | Status: DC | PRN

## 2020-06-21 MED ORDER — PREDNISONE 10 MG PO TABS: ORAL_TABLET | ORAL | 0 refills | Status: DC

## 2020-06-21 MED ORDER — PREDNISONE 10 MG PO TABS
ORAL_TABLET | ORAL | 0 refills | Status: DC
Start: 2020-06-21 — End: 2020-10-27

## 2020-06-21 NOTE — ED Provider Notes (Addendum)
[facility] Emergency Department Provider Note   ____________________________________________   First MD Initiated Contact with Patient 06/21/20 [phone]     (approximate)  I have reviewed the triage vital signs and the nursing notes.   HISTORY  Chief Complaint Knee Pain   HPI Mitchell [name] is a 64 y.o. male presents to the ED with complaint of left knee pain.  Patient states that he was seen at [facility] on 06/18/2020 for the same and reportedly it was aspirated.  He states that the left knee pain has been going on for several weeks and is unaware of any direct injury.  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  He has been using over-the-counter medication without any relief of his pain.  Currently rates pain as a 9/10.     Past Medical History:  Diagnosis Date   Anxiety    Cancer ([place])    skin cancer   Depression    Epididymitis    Hepatitis C    Tested positive, treated now told that he does not have it   Hydrocele    Hypertension    IFG (impaired fasting glucose)    Insomnia    Kidney stone    Schizophrenia [facility])     Patient Active Problem List   Diagnosis Date Noted   Special screening for malignant neoplasms, colon    Problems with swallowing and mastication    Gastritis    Acromioclavicular joint arthritis 04/13/2016   Swollen testicle 08/14/2015   BPH with obstruction/lower urinary tract symptoms 08/14/2015   Hyperlipidemia 07/22/2015   Hypothyroidism 07/17/2015   Scrotal swelling 07/16/2015   Hydrocele, right 07/08/2015   Drug-induced erectile dysfunction 07/08/2015   IFG (impaired fasting glucose)    Schizophrenia ([place])    Insomnia    Anxiety    Hypertension    Depression    Hepatitis C    Developmental delay 01/02/2013    Past Surgical History:  Procedure Laterality Date   ANKLE FRACTURE SURGERY     COLONOSCOPY  2010    COLONOSCOPY WITH PROPOFOL  06/07/2016   Procedure: COLONOSCOPY WITH PROPOFOL;  Surgeon: [name], MD;  Location: [facility] ENDOSCOPY;  Service: Endoscopy;;   ESOPHAGOGASTRODUODENOSCOPY (EGD) WITH PROPOFOL N/A 06/07/2016   Procedure: ESOPHAGOGASTRODUODENOSCOPY (EGD) WITH PROPOFOL;  Surgeon: [name], MD;  Location: [facility] ENDOSCOPY;  Service: Endoscopy;  Laterality: N/A;   HERNIA REPAIR  2011   X 2    Prior to Admission medications   Medication Sig Start Date End Date Taking? Authorizing Provider  aspirin EC 81 MG tablet Take 1 tablet (81 mg total) by mouth daily. 04/07/20   Johnson, Megan P, DO  cyclobenzaprine (FLEXERIL) 5 MG tablet Take 1 tablet (5 mg total) by mouth at bedtime as needed for muscle spasms. 02/14/20   Johnson, Megan P, DO  FLOVENT HFA 220 MCG/ACT inhaler Inhale 2 puffs into the lungs daily. 04/07/20   Johnson, Megan P, DO  levothyroxine (SYNTHROID) 50 MCG tablet TAKE 1 TABLET BY MOUTH  DAILY BEFORE BREAKFAST 05/04/20   Johnson, Megan P, DO  lisinopril (ZESTRIL) 5 MG tablet Take 1.5 tablets (7.5 mg total) by mouth daily. 04/07/20   Johnson, Megan P, DO  loratadine (CLARITIN) 10 MG tablet Take 10 mg by mouth daily.    [provider]  Multiple Vitamin (MULTI-VITAMINS) TABS Take by mouth.    [provider]  naproxen sodium (ALEVE) 220 MG tablet  Take 220 mg by mouth daily as needed (patient states he takes about 2 times a week).    [provider]  predniSONE (DELTASONE) 10 MG tablet Take 6 tablets  today, on day 2 take 5 tablets, day 3 take 4 tablets, day 4 take 3 tablets, day 5 take  2 tablets and 1 tablet the last day 06/21/20   Johnn Hai, PA-C  rosuvastatin (CRESTOR) 10 MG tablet TAKE 1 TABLET BY MOUTH  DAILY 12/25/19   Johnson, Megan P, DO  tadalafil (CIALIS) 20 MG tablet TAKE 1/2 TO 1 TABLET BY MOUTH EVERY OTHER DAY AS NEEDED FOR ERECTILE DYSFUNCTION 04/07/20   Park Liter P, DO  traMADol (ULTRAM) 50 MG tablet Take 1 tablet (50 mg total) by mouth  every 6 (six) hours as needed. 06/21/20   Johnn Hai, PA-C    Allergies Haloperidol decanoate, Atorvastatin, and Codeine  Family History  Problem Relation Age of Onset   Diabetes Mother    Alzheimer's disease Father    Diabetes Brother    Diabetes Maternal Grandfather     Social History Social History   Tobacco Use   Smoking status: Former Smoker    Quit date: 07/06/1995    Years since quitting: 24.9   Smokeless tobacco: Former Counsellor Use: Never used  Substance Use Topics   Alcohol use: Not Currently    Comment: remote history, quit 20+ years ago (1 beer q 2-3 days)   Drug use: No    Comment: remote history, quit 20= years ago    Review of Systems Constitutional: No fever/chills Cardiovascular: Denies chest pain. Respiratory: Denies shortness of breath. Gastrointestinal: No abdominal pain.  No nausea, no vomiting.  Musculoskeletal: Left knee pain. Skin: Negative for rash. Neurological: Negative for headaches, focal weakness or numbness. ____________________________________________   PHYSICAL EXAM:  VITAL SIGNS: ED Triage Vitals  Enc Vitals Group     BP 06/21/20 0545 (!) 153/107     Pulse Rate 06/21/20 0545 64     Resp 06/21/20 0545 17     Temp 06/21/20 0545 98.3 F (36.8 C)     Temp src --      SpO2 06/21/20 0545 98 %     Weight 06/21/20 0542 176 lb 12.9 oz (80.2 kg)     Height 06/21/20 0542 5\' 3"  (1.6 m)     Head Circumference --      Peak Flow --      Pain Score 06/21/20 0542 9     Pain Loc --      Pain Edu? --      Excl. in [place]? --     Constitutional: Alert and oriented. Well appearing and in no acute distress. Eyes: Conjunctivae are normal.  Head: Atraumatic. Neck: No stridor.   Cardiovascular: Normal rate, regular rhythm. Grossly normal heart sounds.  Good peripheral circulation. Respiratory: Normal respiratory effort.  No retractions. Lungs CTAB. Musculoskeletal: Examination left knee there is no gross  deformity however there is some soft tissue edema present anteriorly.  Patient with callused skin over the patella due to chronically being on his knees.  No erythema is appreciated.  Range of motion is restricted secondary to pain.  Skin is intact.  Pulses present.  Motor sensory function intact. Neurologic:  Normal speech and language. No gross focal neurologic deficits are appreciated.  Skin:  Skin is warm, dry and intact.  Psychiatric: Mood and affect are normal. Speech and behavior are normal.  ____________________________________________   LABS (all labs ordered are listed, but only abnormal results are displayed)  Labs Reviewed - No data to display ____________________________________________  RADIOLOGY I, Johnn Hai, personally viewed and evaluated these images (plain radiographs) as part of my medical decision making, as well as reviewing the written report by the radiologist.   Official radiology report(s): DG Knee Left Port  Result Date: 06/21/2020 CLINICAL DATA:  Left knee pain for several weeks. No reported injury. EXAM: PORTABLE LEFT KNEE - 1-2 VIEW COMPARISON:  None. FINDINGS: Prominent prepatellar soft tissue swelling. No significant joint effusion. No fracture or dislocation. Tiny enthesophyte at the tibial tubercle. No significant degenerative arthropathy. No suspicious focal osseous lesions. No radiopaque foreign bodies. IMPRESSION: Prominent prepatellar soft tissue swelling, cannot exclude prepatellar bursitis or cellulitis. No acute osseous abnormality. Tiny tibial tubercle enthesophyte. Electronically Signed   By: Ilona Sorrel M.D.   On: 06/21/2020 06:19    ____________________________________________   PROCEDURES  Procedure(s) performed (including Critical Care):  Procedures Knee immobilizer was applied by provider and RN.  ____________________________________________   INITIAL IMPRESSION / ASSESSMENT AND PLAN / ED COURSE  As part of my medical  decision making, I reviewed the following data within the electronic MEDICAL RECORD NUMBER Notes from prior ED visits and [place] Controlled Substance Database  64 year old male presents to the ED with complaint of left knee pain for several weeks.  He was evaluated by [facility] on 06/18/2020 at which time his knee was aspirated.  Patient has been taking over-the-counter medication without any relief.  X-rays today show findings consistent with a bursitis.  No cellulitis is appreciated on today's exam.  Patient was placed in a knee immobilizer with instructions not to be down on his knees and follow-up with [facility] if any continued problems.  A prescription for tramadol and a prednisone taper was sent to his pharmacy for him to start today.  ____________________________________________   FINAL CLINICAL IMPRESSION(S) / ED DIAGNOSES  Final diagnoses:  Pain  Acute pain of left knee     ED Discharge Orders         Ordered    traMADol (ULTRAM) 50 MG tablet  Every 6 hours PRN        06/21/20 0910    predniSONE (DELTASONE) 10 MG tablet        06/21/20 5809          *Please note:  Mitchell [name] was evaluated in Emergency Department on 06/21/2020 for the symptoms described in the history of present illness. He was evaluated in the context of the global COVID-19 pandemic, which necessitated consideration that the patient might be at risk for infection with the SARS-CoV-2 virus that causes COVID-19. Institutional protocols and algorithms that pertain to the evaluation of patients at risk for COVID-19 are in a state of rapid change based on information released by regulatory bodies including the CDC and federal and state organizations. These policies and algorithms were followed during the patient's care in the ED.  Some ED evaluations and interventions may be delayed as a result of limited staffing during and the pandemic.*   Note:  This document was prepared using Dragon voice recognition software and  may include unintentional dictation errors.    Johnn Hai, PA-C 06/21/20 0928    Johnn Hai, PA-C 06/21/20 1432    Naaman Plummer, MD 06/21/20 1536

## 2020-06-21 NOTE — ED Triage Notes (Signed)
Patient c/o left knee pain for several weeks. Patient seen at urgent care for same and dx with bursitis; patient received injection in knee and drew out fluid.

## 2020-06-21 NOTE — Telephone Encounter (Cosign Needed)
Patient called stating that his prescriptions have not been sent to his pharmacy.  I spoke with the pharmacist and prescription is not transmitting and is hung up in the queue since 9:39 AM.  Pharmacist took a verbal order over the phone for both medications.

## 2020-06-21 NOTE — Discharge Instructions (Signed)
Follow-up with [facility] if any continued problems or not improving.  Begin taking the medication that was prescribed for you today.  The pain medication could cause drowsiness and increase your risk for injury do not drive or operate machinery while taking this.  The knee immobilizer is to be worn when you are up walking.  Do not have to sleep in this.  This will allow more support to your knee and avoid twisting.  Take prednisone as directed starting with 6 tablets today and tapering down.

## 2020-06-21 NOTE — ED Notes (Signed)
Patient ambulating to bathroom, steady gait with cane, in NAD

## 2020-06-21 NOTE — ED Notes (Signed)
See triage note.  Pt to the room in a wheelchair, able to step from the wheelchair to the bed using the bed for assistance but with difficulty and pain.  Left knee, throbbing pain, 9/10.  Pt has been using ice, heat, icyhot spray, aleve, and ibuprofen but pain persists.

## 2020-06-21 NOTE — ED Provider Notes (Signed)
Patient called stating that [facility] has not received the E prescription and could not be resent.   Johnn Hai, PA-C 06/21/20 1431    Naaman Plummer, MD 06/21/20 1536

## 2020-06-26 ENCOUNTER — Ambulatory Visit (INDEPENDENT_AMBULATORY_CARE_PROVIDER_SITE_OTHER): Payer: Medicare Other

## 2020-06-26 ENCOUNTER — Other Ambulatory Visit: Payer: Self-pay

## 2020-06-26 ENCOUNTER — Ambulatory Visit: Payer: Medicare Other

## 2020-06-26 DIAGNOSIS — Z23 Encounter for immunization: Secondary | ICD-10-CM

## 2020-07-29 ENCOUNTER — Telehealth: Payer: Self-pay | Admitting: General Practice

## 2020-07-29 ENCOUNTER — Telehealth: Payer: Self-pay

## 2020-07-29 NOTE — Telephone Encounter (Signed)
  Chronic Care Management   Outreach Note  07/29/2020 Name: Mitchell [name] MRN: [id number] DOB: 02-Nov-1955  Referred by: Valerie Roys, DO Reason for referral : Chronic Care Management (RNCM Chronic Disease Management and Care Coordination Needs)   An unsuccessful telephone outreach was attempted today. The patient was referred to the case management team for assistance with care management and care coordination.   Follow Up Plan: A HIPAA compliant phone message was left for the patient providing contact information and requesting a return call.   [name] RN, MSN, [facility] Mobile: [phone]

## 2020-08-03 IMAGING — DX PORTABLE CHEST - 1 VIEW
1 series · 1 of 1 positions shown · non-contrast
Comparison: 01/06/2017

CLINICAL DATA: Dyspnea

EXAM:
PORTABLE CHEST 1 VIEW

[chest ap]
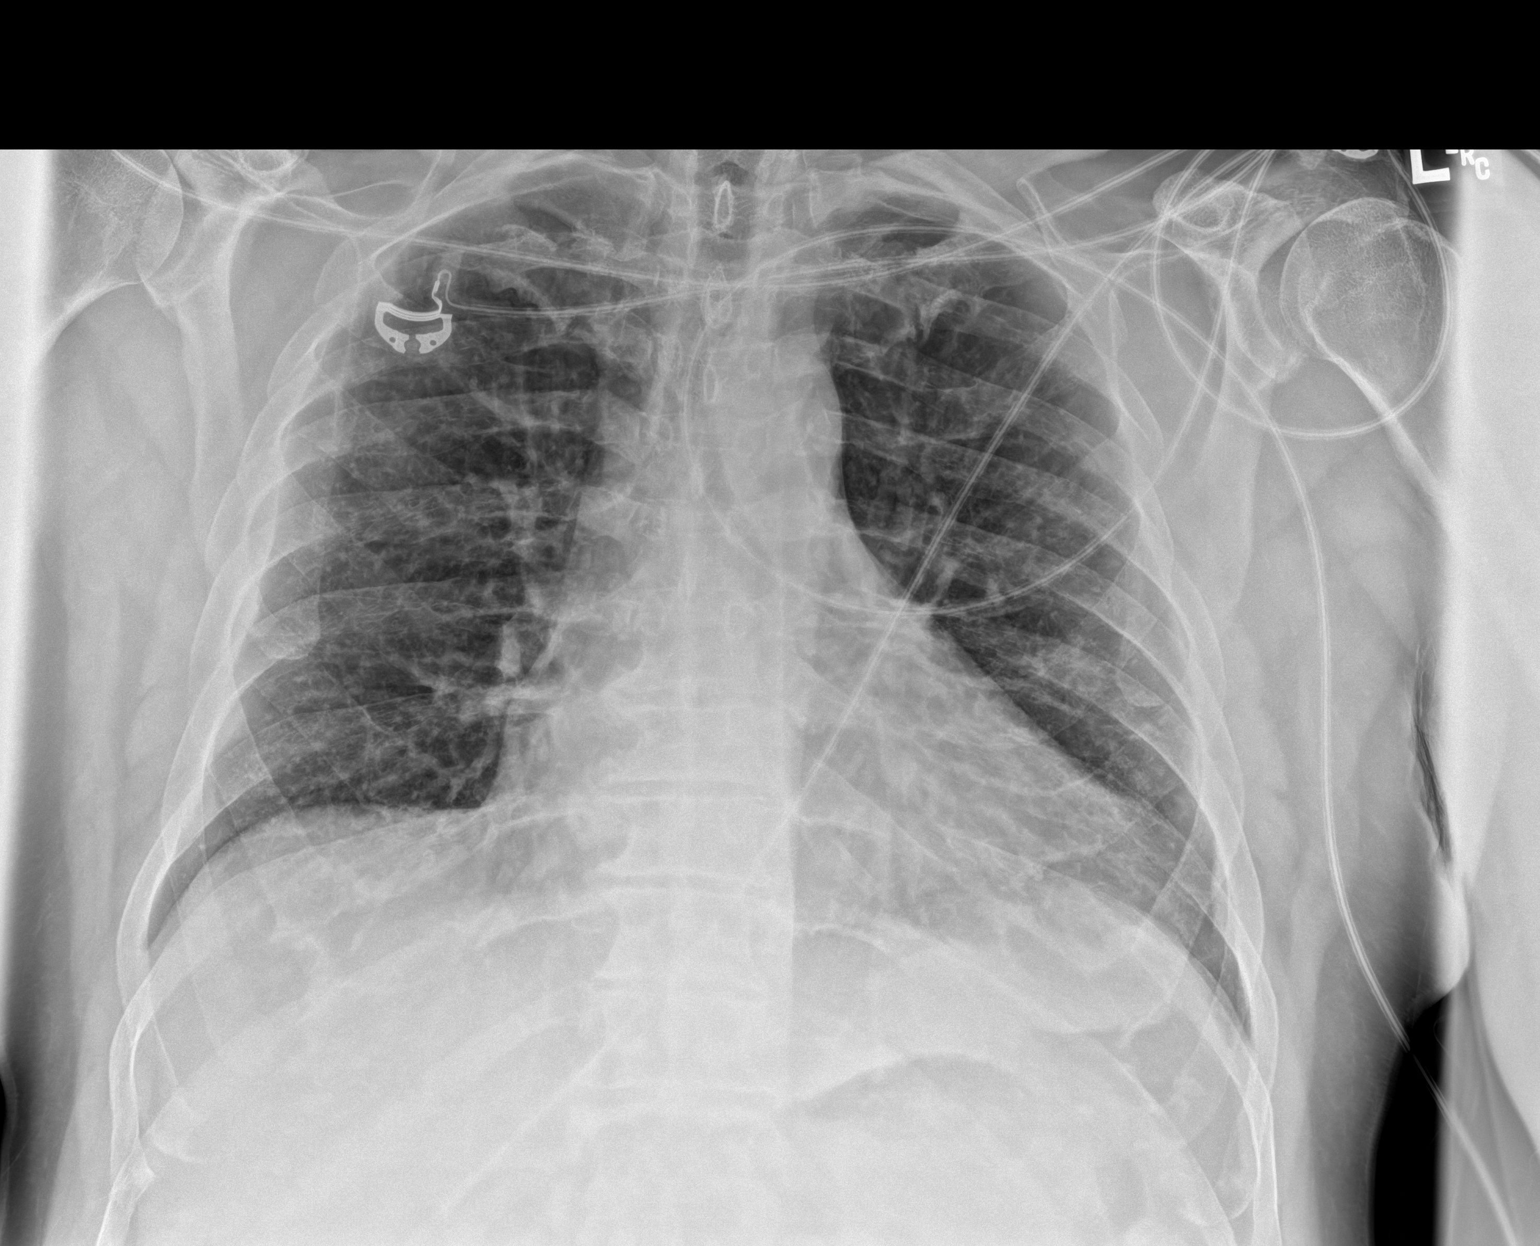

[1 of 1 positions shown; findings below may reference images not displayed]

FINDINGS: Mildly coarse chronic interstitial opacity. No focal airspace
disease or effusion. Stable cardiomediastinal silhouette. No
pneumothorax.
IMPRESSION: No active disease.

## 2020-08-10 ENCOUNTER — Ambulatory Visit: Payer: Medicare Other | Admitting: Dermatology

## 2020-08-10 DIAGNOSIS — M7042 Prepatellar bursitis, left knee: Secondary | ICD-10-CM | POA: Diagnosis not present

## 2020-08-11 ENCOUNTER — Telehealth: Payer: Self-pay

## 2020-08-11 IMAGING — DX PORTABLE CHEST - 1 VIEW
1 series · 1 of 1 positions shown · non-contrast
Comparison: 01/11/2019

CLINICAL DATA: Shortness of breath

EXAM:
PORTABLE CHEST 1 VIEW

[chest ap]
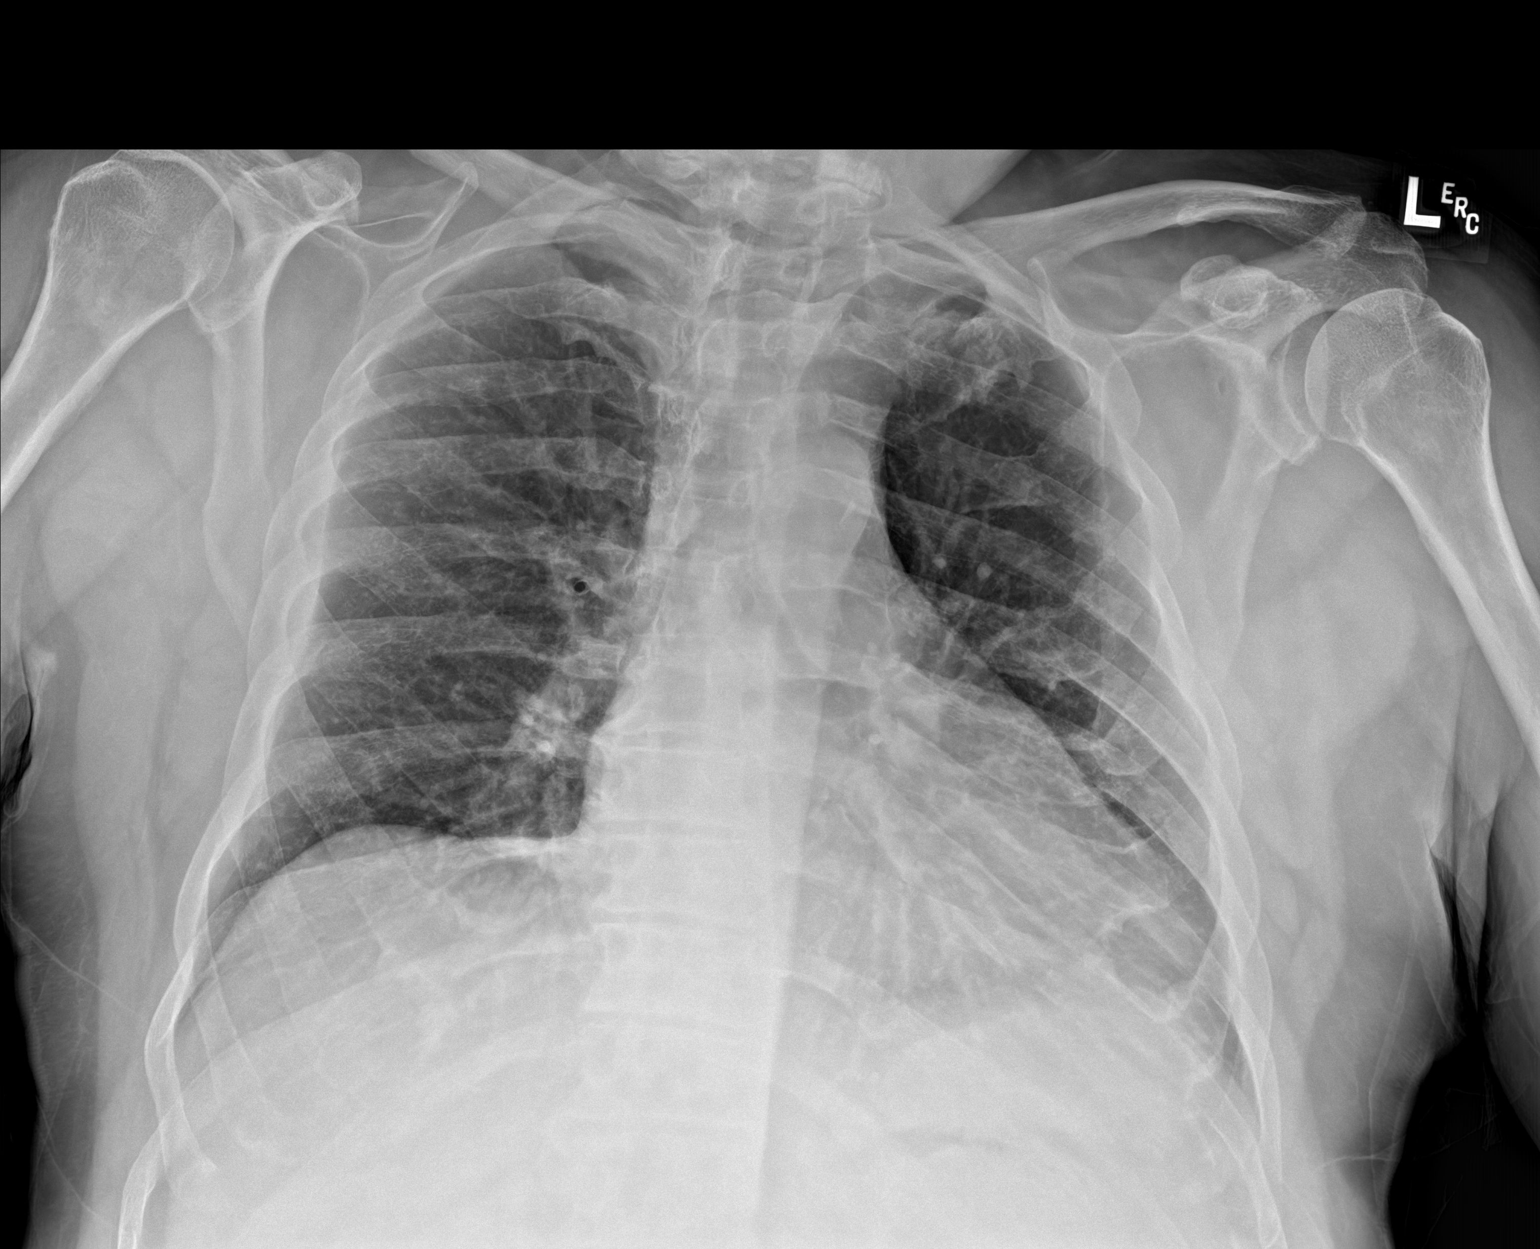

[1 of 1 positions shown; findings below may reference images not displayed]

FINDINGS: Scarring at the left lung base, stable. No acute confluent airspace
opacities or effusions. Heart is upper limits normal in size. No
acute bony abnormality.
IMPRESSION: No active disease.

## 2020-08-11 NOTE — Chronic Care Management (AMB) (Signed)
°  Care Management   Note  08/11/2020 Name: Mitchell [name] MRN: [id number] DOB: 1955/12/29  Mitchell [name] is a 64 y.o. year old male who is a primary care patient of Valerie Roys, DO and is actively engaged with the care management team. I reached out to Odelia Gage by phone today to assist with re-scheduling a follow up visit with the RN Case Manager  Follow up plan: Unsuccessful telephone outreach attempt made. A HIPAA compliant phone message was left for the patient providing contact information and requesting a return call.  The care management team will reach out to the patient again over the next 7 days.  If patient returns call to provider office, please advise to call [place]  at [address], [place], [place], [place] [address] Direct Dial: [phone] [email or web address] Website: [email or web address]

## 2020-08-31 NOTE — Chronic Care Management (AMB) (Signed)
  Care Management   Note  08/31/2020 Name: Mitchell [name] MRN: [id number] DOB: [birth year]  Mitchell [name] is a 64 y.o. year old male who is a primary care patient of Valerie Roys, DO and is actively engaged with the care management team. I reached out to Odelia Gage by phone today to assist with re-scheduling a follow up visit with the RN Case Manager  Follow up plan: Telephone appointment with care management team member scheduled for:10/23/2020  Mitchell [name], [address], [place], [place] [address] Direct Dial: [phone] [name] Website: [email or web address]

## 2020-10-17 ENCOUNTER — Other Ambulatory Visit: Payer: Self-pay | Admitting: Family Medicine

## 2020-10-17 NOTE — Telephone Encounter (Signed)
Requested Prescriptions  Pending Prescriptions Disp Refills  . tadalafil (CIALIS) 20 MG tablet [Pharmacy Med Name: TADALAFIL 20 MG TABLET] 5 tablet 0    Sig: TAKE 0.5-1 TABLET BY MOUTH EVERY OTHER DAY AS NEEDED FOR ERECTILE DYSFUNCTION     Urology: Erectile Dysfunction Agents Failed - 10/17/2020 11:12 AM      Failed - Last BP in normal range    BP Readings from Last 1 Encounters:  06/21/20 (!) 144/80         Passed - Valid encounter within last 12 months    Recent Outpatient Visits          6 months ago Routine general medical examination at a health care facility   [facility], [place] P, DO   8 months ago Muscle cramp   [place], [place], DO   1 year ago Essential hypertension   [facility], [place], DO   1 year ago Routine general medical examination at a health care facility   [place], [place], DO   1 year ago Drug-induced erectile dysfunction   [facility] Valerie Roys, DO      Future Appointments            In 1 month Ralene Bathe, MD [place]   In 3 months  [facility], [place]

## 2020-10-20 ENCOUNTER — Other Ambulatory Visit: Payer: Self-pay | Admitting: Family Medicine

## 2020-10-20 NOTE — Telephone Encounter (Signed)
Requested Prescriptions  Pending Prescriptions Disp Refills  . levothyroxine (SYNTHROID) 50 MCG tablet [Pharmacy Med Name: Levothyroxine Sodium 50 MCG Oral Tablet] 90 tablet 1    Sig: TAKE 1 TABLET BY MOUTH  DAILY BEFORE BREAKFAST     Endocrinology:  Hypothyroid Agents Failed - 10/20/2020 11:00 PM      Failed - TSH needs to be rechecked within 3 months after an abnormal result. Refill until TSH is due.      Passed - TSH in normal range and within 360 days    TSH  Date Value Ref Range Status  04/07/2020 3.540 0.450 - 4.500 uIU/mL Final         Passed - Valid encounter within last 12 months    Recent Outpatient Visits          6 months ago Routine general medical examination at a health care facility   [facility] [place] P, DO   8 months ago Muscle cramp   [place], [place], DO   1 year ago Essential hypertension   [place], [place], DO   1 year ago Routine general medical examination at a health care facility   [place], [place], DO   1 year ago Drug-induced erectile dysfunction   [facility] Valerie Roys, DO      Future Appointments            In 1 month Ralene Bathe, MD [place]   In 3 months  [facility], [place]

## 2020-10-23 ENCOUNTER — Ambulatory Visit (INDEPENDENT_AMBULATORY_CARE_PROVIDER_SITE_OTHER): Payer: Medicare Other | Admitting: General Practice

## 2020-10-23 ENCOUNTER — Telehealth: Payer: Medicare Other | Admitting: General Practice

## 2020-10-23 DIAGNOSIS — F331 Major depressive disorder, recurrent, moderate: Secondary | ICD-10-CM

## 2020-10-23 DIAGNOSIS — I10 Essential (primary) hypertension: Secondary | ICD-10-CM | POA: Diagnosis not present

## 2020-10-23 DIAGNOSIS — E782 Mixed hyperlipidemia: Secondary | ICD-10-CM | POA: Diagnosis not present

## 2020-10-23 DIAGNOSIS — U071 COVID-19: Secondary | ICD-10-CM | POA: Diagnosis not present

## 2020-10-23 DIAGNOSIS — Z20822 Contact with and (suspected) exposure to covid-19: Secondary | ICD-10-CM | POA: Diagnosis not present

## 2020-10-23 NOTE — Chronic Care Management (AMB) (Signed)
Chronic Care Management   CCM RN Visit Note  10/23/2020 Name: Mitchell [name] MRN: [id number] DOB: November 27, 1955  Subjective: Mitchell [name] is a 65 y.o. year old male who is a primary care patient of Valerie Roys, DO. The care management team was consulted for assistance with disease management and care coordination needs.    Engaged with patient by telephone for follow up visit in response to provider referral for case management and/or care coordination services.   Consent to Services:  The patient was given information about Chronic Care Management services, agreed to services, and gave verbal consent prior to initiation of services.  Please see initial visit note for detailed documentation.   Patient agreed to services and verbal consent obtained.   Assessment: Review of patient past medical history, allergies, medications, health status, including review of consultants reports, laboratory and other test data, was performed as part of comprehensive evaluation and provision of chronic care management services.   SDOH (Social Determinants of Health) assessments and interventions performed:    CCM Care Plan  Allergies  Allergen Reactions  . Haloperidol Decanoate Other (See Comments)    Muscle pain  . Atorvastatin Other (See Comments)    myalgias  . Codeine Nausea And Vomiting    Outpatient Encounter Medications as of 10/23/2020  Medication Sig  . aspirin EC 81 MG tablet Take 1 tablet (81 mg total) by mouth daily.  . cyclobenzaprine (FLEXERIL) 5 MG tablet Take 1 tablet (5 mg total) by mouth at bedtime as needed for muscle spasms.  Marland Kitchen FLOVENT HFA 220 MCG/ACT inhaler Inhale 2 puffs into the lungs daily.  Marland Kitchen levothyroxine (SYNTHROID) 50 MCG tablet TAKE 1 TABLET BY MOUTH  DAILY BEFORE BREAKFAST  . lisinopril (ZESTRIL) 5 MG tablet Take 1.5 tablets (7.5 mg total) by mouth daily.  Marland Kitchen loratadine (CLARITIN) 10 MG tablet Take 10 mg by mouth daily.  . Multiple Vitamin (MULTI-VITAMINS) TABS Take  by mouth.  . naproxen sodium (ALEVE) 220 MG tablet Take 220 mg by mouth daily as needed (patient states he takes about 2 times a week).  . predniSONE (DELTASONE) 10 MG tablet Take 6 tablets  today, on day 2 take 5 tablets, day 3 take 4 tablets, day 4 take 3 tablets, day 5 take  2 tablets and 1 tablet the last day  . predniSONE (DELTASONE) 10 MG tablet Take 6 tablets  today, on day 2 take 5 tablets, day 3 take 4 tablets, day 4 take 3 tablets, day 5 take  2 tablets and 1 tablet the last day  . rosuvastatin (CRESTOR) 10 MG tablet TAKE 1 TABLET BY MOUTH  DAILY  . tadalafil (CIALIS) 20 MG tablet TAKE 0.5-1 TABLET BY MOUTH EVERY OTHER DAY AS NEEDED FOR ERECTILE DYSFUNCTION  . traMADol (ULTRAM) 50 MG tablet Take 1 tablet (50 mg total) by mouth every 6 (six) hours as needed.   No facility-administered encounter medications on file as of 10/23/2020.    Patient Active Problem List   Diagnosis Date Noted  . Special screening for malignant neoplasms, colon   . Problems with swallowing and mastication   . Gastritis   . Acromioclavicular joint arthritis 04/13/2016  . Swollen testicle 08/14/2015  . BPH with obstruction/lower urinary tract symptoms 08/14/2015  . Hyperlipidemia 07/22/2015  . Hypothyroidism 07/17/2015  . Scrotal swelling 07/16/2015  . Hydrocele, right 07/08/2015  . Drug-induced erectile dysfunction 07/08/2015  . IFG (impaired fasting glucose)   . Schizophrenia ([place])   . Insomnia   .  Anxiety   . Hypertension   . Depression   . Hepatitis C   . Developmental delay 01/02/2013    Conditions to be addressed/monitored:HTN, HLD and Depression  Care Plan : RNCM: Depression (Adult)  Updates made by Vanita Ingles since 10/23/2020 12:00 AM    Problem: RNCM: Depression Identification (Depression)   Priority: Medium    Goal: RNCM: Depressive Symptoms Identified   Priority: Medium  Note:   Current Barriers:  . Chronic Disease Management support and education needs related to depression   . Lacks caregiver support.  Leodis Liverpool social connections . Does not maintain contact with provider office . Does not contact provider office for questions/concerns  Nurse Case Manager Clinical Goal(s):  Marland Kitchen Over the next 120 days, patient will verbalize understanding of plan for effective management of depression  . Over the next 120 days, patient will work with [facility] and pcp to address needs related to depression   Interventions:  . 1:1 collaboration with Valerie Roys, DO regarding development and update of comprehensive plan of care as evidenced by provider attestation and co-signature . Inter-disciplinary care team collaboration (see longitudinal plan of care) . Evaluation of current treatment plan related to depression  and patient's adherence to plan as established by provider. . Advised patient to to call the office for changes in mood/anxiety/and depression  . Provided education to patient re: effective methods for dealing with depression  . Discussed plans with patient for ongoing care management follow up and provided patient with direct contact information for care management team  Patient Goals/Self-Care Activities Over the next 120 days, patient will:  - Patient will self administer medications as prescribed Patient will attend all scheduled provider appointments Patient will call pharmacy for medication refills Patient will attend church or other social activities Patient will continue to perform ADL's independently Patient will continue to perform IADL's independently Patient will call provider office for new concerns or questions Patient will work with BSW to address care coordination needs and will continue to work with the clinical team to address health care and disease management related needs.    - anxiety screen reviewed - depression screen reviewed - medication list reviewed - mental health treatment arranged Follow Up Plan: Telephone follow up appointment with  care management team member scheduled for: 12-25-2020 at 4 pm       Task: RNCM: Identify Depressive Symptoms and Facilitate Treatment   Note:   Care Management Activities:    - anxiety screen reviewed - depression screen reviewed - medication list reviewed - mental health treatment arranged    Notes:    Care Plan : RNCM: Hypertension (Adult)  Updates made by Vanita Ingles since 10/23/2020 12:00 AM    Problem: RNCM: Hypertension (Hypertension)   Priority: Medium    Goal: RNCM: Hypertension Monitored   Priority: Medium  Note:   Objective:  . Last practice recorded BP readings:  BP Readings from Last 3 Encounters:  06/21/20 (!) 144/80  04/07/20 115/73  03/24/20 (!) 140/98 .   Marland Kitchen Most recent eGFR/CrCl: No results found for: EGFR  No components found for: CRCL Current Barriers:  Marland Kitchen Knowledge Deficits related to basic understanding of hypertension pathophysiology and self care management . Knowledge Deficits related to understanding of medications prescribed for management of hypertension . Limited Social Support . Unable to independently manage HTN . Lacks social connections . Does not maintain contact with provider office . Does not contact provider office for questions/concerns Case Manager Clinical Goal(s):  .  Over the next 120 days, patient will verbalize understanding of plan for hypertension management . Over the next 120 days, patient will demonstrate improved adherence to prescribed treatment plan for hypertension as evidenced by taking all medications as prescribed, monitoring and recording blood pressure as directed, adhering to low sodium/DASH diet . Over the next 120 days, patient will demonstrate improved health management independence as evidenced by checking blood pressure as directed and notifying PCP if SBP>150 or DBP > 90, taking all medications as prescribe, and adhering to a low sodium diet as discussed. . Over the next 120 days, patient will verbalize basic  understanding of hypertension disease process and self health management plan as evidenced by compliance with heart healthy diet, and working with the CCM team to effectively manage health and well being.  Interventions:  . Collaboration with Valerie Roys, DO regarding development and update of comprehensive plan of care as evidenced by provider attestation and co-signature . Inter-disciplinary care team collaboration (see longitudinal plan of care) . Evaluation of current treatment plan related to hypertension self management and patient's adherence to plan as established by provider. . Provided education to patient re: stroke prevention, s/s of heart attack and stroke, DASH diet, complications of uncontrolled blood pressure . Reviewed medications with patient and discussed importance of compliance . Discussed plans with patient for ongoing care management follow up and provided patient with direct contact information for care management team . Advised patient, providing education and rationale, to monitor blood pressure daily and record, calling PCP for findings outside established parameters.  Patient Goals/Self-Care Activities . Over the next 120 days, patient will:  - Self administers medications as prescribed Attends all scheduled provider appointments Calls provider office for new concerns, questions, or BP outside discussed parameters Checks BP and records as discussed Follows a low sodium diet/DASH diet - blood pressure trends reviewed - depression screen reviewed - home or ambulatory blood pressure monitoring encouraged Follow Up Plan: Telephone follow up appointment with care management team member scheduled for: 12-25-2020 at 4 pm   Task: RNCM: Identify and Monitor Blood Pressure Elevation   Note:   Care Management Activities:    - blood pressure trends reviewed - depression screen reviewed - home or ambulatory blood pressure monitoring encouraged       Care Plan : RNCM:  HLD  Updates made by Vanita Ingles since 10/23/2020 12:00 AM    Problem: RNCM: HLD   Priority: Medium    Goal: RNCM: HLD management   Priority: Medium  Note:   Current Barriers:  . Poorly controlled hyperlipidemia, complicated by HTN, depression, non-adherence to dietary restrictions.  . Current antihyperlipidemic regimen: Crestor 10 mg QD . Most recent lipid panel:     Component Value Date/Time   CHOL 177 04/07/2020 1533   CHOL 155 03/31/2016 1502   TRIG 405 (H) 04/07/2020 1533   TRIG 338 (H) 03/31/2016 1502   HDL 40 04/07/2020 1533   VLDL 68 (H) 03/31/2016 1502   LDLCALC 73 04/07/2020 1533 .   Marland Kitchen ASCVD risk enhancing conditions: age >62,  HTN, former smoker  . Lacks social connections . Does not maintain contact with provider office . Does not contact provider office for questions/concerns  RN Care Manager Clinical Goal(s):  Marland Kitchen Over the next 120 days, patient will work with Consulting civil engineer, providers, and care team towards execution of optimized self-health management plan . Over the next 120 days, patient will verbalize understanding of plan for effective management of HLD .  Over the next 120 days, patient will work with pcp and RNCM  to address needs related to management of HLD   Interventions: . Collaboration with Valerie Roys, DO regarding development and update of comprehensive plan of care as evidenced by provider attestation and co-signature . Inter-disciplinary care team collaboration (see longitudinal plan of care) . Medication review performed; medication list updated in electronic medical record.  Bertram Savin care team collaboration (see longitudinal plan of care) . Referred to pharmacy team for assistance with HLD medication management . Evaluation of current treatment plan related to HLD management  and patient's adherence to plan as established by provider. . Advised patient to call the office for changes in conditions or questions  . Provided  education to patient re: heart healthy diet  . Discussed plans with patient for ongoing care management follow up and provided patient with direct contact information for care management team   Patient Goals/Self-Care Activities: . Over the next 120days, patient will:   - call for medicine refill 2 or 3 days before it runs out - call if I am sick and can't take my medicine - keep a list of all the medicines I take; vitamins and herbals too - learn to read medicine labels - use a pillbox to sort medicine - use an alarm clock or phone to remind me to take my medicine - change to whole grain breads, cereal, pasta - drink 6 to 8 glasses of water each day - eat 5 or 6 small meals each day - fill half the plate with nonstarchy vegetables - limit fast food meals to no more than 1 per week - prepare main meal at home 3 to 5 days each week - be open to making changes - I can manage, know and watch for signs of a heart attack - if I have chest pain, call for help - learn about small changes that will make a big difference - learn my personal risk factors - education plan reviewed and/or amended - empathy and reassurance conveyed - family/caregiver participation in learning encouraged - health literacy screen reviewed - patient's preferred learning methods utilized - privacy ensured - questions encouraged - readiness to learn monitored   Follow Up Plan: Telephone follow up appointment with care management team member scheduled for:12-25-2020 at 4 pm     Task: RNCM: HLD   Note:   Care Management Activities:    - education plan reviewed and/or amended - empathy and reassurance conveyed - family/caregiver participation in learning encouraged - health literacy screen reviewed - patient's preferred learning methods utilized - privacy ensured - questions encouraged - readiness to learn monitored         Plan:Telephone follow up appointment with care management team member scheduled  for:  12-25-2020 at 4 pm  [name] RN, MSN, [facility] Mobile: [phone]

## 2020-10-23 NOTE — Patient Instructions (Signed)
Visit Information  PATIENT GOALS: Goals Addressed            This Visit's Progress   . COMPLETED: RNCM: I don't really do anything special to take care of myself       Current Barriers: Closing this goal and opening in new ELS . Chronic Disease Management support, education, and care coordination needs related to HTN, HLD, and Depression  Clinical Goal(s) related to HTN, HLD, and Depression:  Over the next 60 days, patient will:  . Work with the care management team to address educational, disease management, and care coordination needs  . Begin or continue self health monitoring activities as directed today Measure and record blood pressure 2 times per week and exercise at least 20 minutes 3 or more times a week . Call provider office for new or worsened signs and symptoms Blood pressure findings outside established parameters, New or worsened symptom related to HTN, HLD and depression, and worsening or unrelieved shoulder pain . Call care management team with questions or concerns . Verbalize basic understanding of patient centered plan of care established today  Interventions related to HTN, HLD, and Depression:  . Evaluation of current treatment plans and patient's adherence to plan as established by provider. 05-20-2020: The patient is doing well and denies any new concerns. States he has been feeling great.  . Assessed patient understanding of disease states.  The patient verbalized he understands about his conditions and does what the doctor tells him to do. He denies any issues. Has not been taking his blood pressure at home but will start to do so.  . Assessed patient's education and care coordination needs.  No new needs at this time.  The patient knows resources are available to help as needed.  . Provided disease specific education to patient. Education given on the benefits of checking and recording blood pressures at least 2 times a week.  The patient verbalized he has not taken  it in a while but will start doing that. The patient verbalized that his wife makes sure he does not eat foods with salts or fats. She even takes the cholesterol out of eggs. The patient says she helps him stay healthy. Praised for healthy dietary habits. The patient is more active since now that the weather is better. He is planning on working in his yard and planting [employer] this weekend. 05-20-2020: The patient is enjoying the cooler weather. Is working in the yard and denies any issues with following dietary restrictions.  . Evaluation of upcoming appointments. The patient does not have any upcoming appointments to see the pcp but calls as needed. The patient did want to make sure he wrote the [facility] number down again as he had misplaced it. Education and support given.  Nash Dimmer with appropriate clinical care team members regarding patient needs.  The patient denies any needs for CCM team LCSW or pharmacist at this time but knows they are available.   Patient Self Care Activities related to HTN, HLD, and Depression:  . Patient is unable to independently self-manage chronic health conditions  Please see past updates related to this goal by clicking on the "Past Updates" button in the selected goal      . COMPLETED: RNCM: I have this shoulder pain. 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 No issues with pain at this time . Knowledge Deficits related to  how to effectively manager pain in his right shoulder . Literacy barriers  Nurse Case Manager Clinical Goal(s):  Marland Kitchen Over the next 120 days, patient will verbalize understanding of plan for treatment options related to unrelieved pain in right shoulder . Over the next 120 days, patient will attend all scheduled medical appointments: Upcoming appointment Monday for a cortisone shot in his right shoulder per the patient . Over the next 120 days, patient will demonstrate  improved adherence to prescribed treatment plan for right shoulder  as evidenced bydecreased pain and discomfort in the right shoulder  Interventions:  . Evaluation of current treatment plan related to right shoulder pain and patient's adherence to plan as established by provider. . Provided education to patient re: cortisone shots and effectiveness. The patient had a shot a couple of months ago and states that his shoulder is doing a lot better. Still has some pain but not like he did. 05-20-2020: The patient states the pain comes and goes. Denies any issues and says it does not prevent him from doing his normal activity.  . Reviewed medications with patient and discussed compliance and pain relief measures . Discussed plans with patient for ongoing care management follow up and provided patient with direct contact information for care management team . Reviewed scheduled/upcoming provider appointments including: appointment on 10/21/2019 for Cortisone injection. This has done well for the patient. He denies any new concerns at this time. He follows up as needed.  . Pharmacy referral for review of current medications regimen and any questions related to cortisone injections  Patient Self Care Activities:  . Patient verbalizes understanding of plan to address shoulder pain and discomfort and treatment options . Self administers medications as prescribed . Attends all scheduled provider appointments . Calls provider office for new concerns or questions . Unable to independently control shoulder pain as evidence of unrelieved pain in right shoulder with current treatment regimen of cream, hot/cold compresses, and taking "Aleve"  Please see past updates related to this goal by clicking on the "Past Updates" button in the selected goal         The patient verbalized understanding of instructions, educational materials, and care plan provided today and declined offer to receive copy of patient  instructions, educational materials, and care plan.   Telephone follow up appointment with care management team member scheduled for: 12-25-2020 at 4 pm  [name] RN, MSN, [facility] Mobile: [phone]

## 2020-10-27 ENCOUNTER — Ambulatory Visit (INDEPENDENT_AMBULATORY_CARE_PROVIDER_SITE_OTHER): Payer: Medicare Other | Admitting: Family Medicine

## 2020-10-27 ENCOUNTER — Encounter: Payer: Self-pay | Admitting: Family Medicine

## 2020-10-27 VITALS — BP 128/87

## 2020-10-27 DIAGNOSIS — F419 Anxiety disorder, unspecified: Secondary | ICD-10-CM

## 2020-10-27 DIAGNOSIS — U071 COVID-19: Secondary | ICD-10-CM

## 2020-10-27 DIAGNOSIS — I10 Essential (primary) hypertension: Secondary | ICD-10-CM | POA: Diagnosis not present

## 2020-10-27 MED ORDER — HYDROXYZINE HCL 25 MG PO TABS: 25.0000 mg | ORAL_TABLET | Freq: Three times a day (TID) | ORAL | 1 refills | Status: DC | PRN

## 2020-10-27 NOTE — Assessment & Plan Note (Signed)
Feeling a bit more anxious. Will start hydroxyzine and recheck 1 month.

## 2020-10-27 NOTE — Progress Notes (Signed)
BP 128/87    Subjective:    Patient ID: Mitchell [name], male    DOB: 10-19-1955, 65 y.o.   MRN: [id number]  HPI: Mitchell [name] is a 65 y.o. male  Chief Complaint  Patient presents with  . blood pressure concerns    Patient states his blood pressure has been elevated. Patient is positive for covid and believes it is the reason his bp is elevated.    Tested positive for COVID 10/22/20 when he went to urgent care. He has been feeling OK. He notes that he has been stuffy and having some headaches and body aches. He has been taking some medicine for his cough. He states that he has been doing OK  HYPERTENSION Hypertension status: fluctuating  Satisfied with current treatment? yes Duration of hypertension: chronic BP monitoring frequency:  a few times a week BP range: 185U-314H systolic BP medication side effects:  no Medication compliance: excellent compliance Aspirin: no Recurrent headaches: no Visual changes: no Palpitations: no Dyspnea: no Chest pain: no Lower extremity edema: no Dizzy/lightheaded: no   Relevant past medical, surgical, family and social history reviewed and updated as indicated. Interim medical history since our last visit reviewed. Allergies and medications reviewed and updated.  Review of Systems  Constitutional: Negative.   HENT: Positive for congestion, postnasal drip and rhinorrhea. Negative for dental problem, drooling, ear discharge, ear pain, facial swelling, hearing loss, mouth sores, nosebleeds, sinus pressure, sinus pain, sneezing, sore throat, tinnitus, trouble swallowing and voice change.   Respiratory: Negative.   Cardiovascular: Negative.   Gastrointestinal: Negative.   Neurological: Negative.   Psychiatric/Behavioral: Negative for agitation, behavioral problems, confusion, decreased concentration, dysphoric mood, hallucinations, self-injury, sleep disturbance and suicidal ideas. The patient is nervous/anxious. The patient is not hyperactive.      Per HPI unless specifically indicated above     Objective:    BP 128/87   Wt Readings from Last 3 Encounters:  06/21/20 176 lb 12.9 oz (80.2 kg)  04/07/20 176 lb 12.8 oz (80.2 kg)  03/24/20 171 lb 4.8 oz (77.7 kg)    Physical Exam Vitals and nursing note reviewed.  Pulmonary:     Effort: Pulmonary effort is normal. No respiratory distress.     Comments: Speaking in full sentences Neurological:     Mental Status: He is alert.  Psychiatric:        Mood and Affect: Mood normal.        Behavior: Behavior normal.        Thought Content: Thought content normal.        Judgment: Judgment normal.     Results for orders placed or performed in visit on 04/07/20  Bayer DCA Hb A1c Waived  Result Value Ref Range   HB A1C (BAYER DCA - WAIVED) 6.2 <7.0 %  CBC with Differential/Platelet  Result Value Ref Range   WBC 8.2 3.4 - 10.8 x10E3/uL   RBC 4.74 4.14 - 5.80 x10E6/uL   Hemoglobin 15.0 13.0 - 17.7 g/dL   Hematocrit 43.6 37.5 - 51.0 %   MCV 92 79 - 97 fL   MCH 31.6 26.6 - 33.0 pg   MCHC 34.4 31.5 - 35.7 g/dL   RDW 13.3 11.6 - 15.4 %   Platelets 286 150 - 450 x10E3/uL   Neutrophils 56 Not Estab. %   Lymphs 33 Not Estab. %   Monocytes 8 Not Estab. %   Eos 3 Not Estab. %   Basos 0 Not Estab. %  Neutrophils Absolute 4.5 1.4 - 7.0 x10E3/uL   Lymphocytes Absolute 2.7 0.7 - 3.1 x10E3/uL   Monocytes Absolute 0.7 0.1 - 0.9 x10E3/uL   EOS (ABSOLUTE) 0.3 0.0 - 0.4 x10E3/uL   Basophils Absolute 0.0 0.0 - 0.2 x10E3/uL   Immature Granulocytes 0 Not Estab. %   Immature Grans (Abs) 0.0 0.0 - 0.1 x10E3/uL  Comprehensive metabolic panel  Result Value Ref Range   Glucose 128 (H) 65 - 99 mg/dL   BUN 20 8 - 27 mg/dL   Creatinine, Ser 1.15 0.76 - 1.27 mg/dL   GFR calc non Af Amer 66 >59 mL/min/1.73   GFR calc Af Amer 77 >59 mL/min/1.73   BUN/Creatinine Ratio 17 10 - 24   Sodium 143 134 - 144 mmol/L   Potassium 4.8 3.5 - 5.2 mmol/L   Chloride 108 (H) 96 - 106 mmol/L   CO2 21 20 - 29  mmol/L   Calcium 9.3 8.6 - 10.2 mg/dL   Total Protein 7.0 6.0 - 8.5 g/dL   Albumin 4.1 3.8 - 4.8 g/dL   Globulin, Total 2.9 1.5 - 4.5 g/dL   Albumin/Globulin Ratio 1.4 1.2 - 2.2   Bilirubin Total 0.3 0.0 - 1.2 mg/dL   Alkaline Phosphatase 44 (L) 48 - 121 IU/L   AST 21 0 - 40 IU/L   ALT 17 0 - 44 IU/L  HCV RNA quant  Result Value Ref Range   Hepatitis C Quantitation HCV Not Detected IU/mL   Test Information Comment   Lipid Panel w/o Chol/HDL Ratio  Result Value Ref Range   Cholesterol, Total 177 100 - 199 mg/dL   Triglycerides 405 (H) 0 - 149 mg/dL   HDL 40 >39 mg/dL   VLDL Cholesterol Cal 64 (H) 5 - 40 mg/dL   LDL Chol Calc (NIH) 73 0 - 99 mg/dL  Microalbumin, Urine Waived  Result Value Ref Range   Microalb, Ur Waived 10 0 - 19 mg/L   Creatinine, Urine Waived 200 10 - 300 mg/dL   Microalb/Creat Ratio <30 <30 mg/g  PSA  Result Value Ref Range   Prostate Specific Ag, Serum 0.6 0.0 - 4.0 ng/mL  TSH  Result Value Ref Range   TSH 3.540 0.450 - 4.500 uIU/mL  Urinalysis, Routine w reflex microscopic  Result Value Ref Range   Specific Gravity, UA 1.025 1.005 - 1.030   pH, UA 6.0 5.0 - 7.5   Color, UA Yellow Yellow   Appearance Ur Clear Clear   Leukocytes,UA Negative Negative   Protein,UA Negative Negative/Trace   Glucose, UA Negative Negative   Ketones, UA Negative Negative   RBC, UA Negative Negative   Bilirubin, UA Negative Negative   Urobilinogen, Ur 0.2 0.2 - 1.0 mg/dL   Nitrite, UA Negative Negative      Assessment & Plan:   Problem List Items Addressed This Visit      Cardiovascular and Mediastinum   Hypertension    BP up and down, likely due to meds and illness. Continue to monitor. Recheck 1 month.         Other   Anxiety    Feeling a bit more anxious. Will start hydroxyzine and recheck 1 month.       Relevant Medications   hydrOXYzine (ATARAX/VISTARIL) 25 MG tablet    Other Visit Diagnoses    COVID-19    -  Primary   Very mild symptoms. Feeling OK.  Self-quarantine until 10 days after onset of symptoms. Call with any concerns.  Follow up plan: Return in about 4 weeks (around 11/24/2020).    . This visit was completed via telephone due to the restrictions of the COVID-19 pandemic. All issues as above were discussed and addressed but no physical exam was performed. If it was felt that the patient should be evaluated in the office, they were directed there. The patient verbally consented to this visit. Patient was unable to complete an audio/visual visit due to Lack of equipment. Due to the catastrophic nature of the COVID-19 pandemic, this visit was done through audio contact only. . Location of the patient: home . Location of the provider: work . Those involved with this call:  . Provider: Park Liter, DO . CMA: Louanna Raw, [place] . Front Desk/Registration: Jill Side  . Time spent on call: 21 minutes on the phone discussing health concerns. 30 minutes total spent in review of patient's record and preparation of their chart.

## 2020-10-27 NOTE — Assessment & Plan Note (Signed)
BP up and down, likely due to meds and illness. Continue to monitor. Recheck 1 month.

## 2020-10-29 ENCOUNTER — Other Ambulatory Visit: Payer: Self-pay | Admitting: Family Medicine

## 2020-10-29 DIAGNOSIS — I10 Essential (primary) hypertension: Secondary | ICD-10-CM

## 2020-10-31 ENCOUNTER — Other Ambulatory Visit: Payer: Self-pay | Admitting: Family Medicine

## 2020-11-13 ENCOUNTER — Other Ambulatory Visit: Payer: Self-pay | Admitting: Family Medicine

## 2020-11-27 ENCOUNTER — Other Ambulatory Visit: Payer: Self-pay | Admitting: Family Medicine

## 2020-11-30 ENCOUNTER — Ambulatory Visit: Payer: Medicare Other | Admitting: Dermatology

## 2020-12-11 ENCOUNTER — Other Ambulatory Visit: Payer: Self-pay | Admitting: Family Medicine

## 2020-12-21 ENCOUNTER — Other Ambulatory Visit: Payer: Self-pay | Admitting: Family Medicine

## 2020-12-21 ENCOUNTER — Other Ambulatory Visit: Payer: Self-pay

## 2020-12-21 ENCOUNTER — Ambulatory Visit: Payer: Medicare Other | Admitting: Dermatology

## 2020-12-21 DIAGNOSIS — D692 Other nonthrombocytopenic purpura: Secondary | ICD-10-CM

## 2020-12-21 DIAGNOSIS — L578 Other skin changes due to chronic exposure to nonionizing radiation: Secondary | ICD-10-CM | POA: Diagnosis not present

## 2020-12-21 DIAGNOSIS — Z1283 Encounter for screening for malignant neoplasm of skin: Secondary | ICD-10-CM | POA: Diagnosis not present

## 2020-12-21 DIAGNOSIS — L820 Inflamed seborrheic keratosis: Secondary | ICD-10-CM | POA: Diagnosis not present

## 2020-12-21 DIAGNOSIS — L821 Other seborrheic keratosis: Secondary | ICD-10-CM | POA: Diagnosis not present

## 2020-12-21 DIAGNOSIS — D1800 Hemangioma unspecified site: Secondary | ICD-10-CM | POA: Diagnosis not present

## 2020-12-21 DIAGNOSIS — D229 Melanocytic nevi, unspecified: Secondary | ICD-10-CM

## 2020-12-21 DIAGNOSIS — L814 Other melanin hyperpigmentation: Secondary | ICD-10-CM | POA: Diagnosis not present

## 2020-12-21 NOTE — Patient Instructions (Addendum)
Cryotherapy Aftercare  . Wash gently with soap and water everyday.   . Apply Vaseline and Band-Aid daily until healed.  

## 2020-12-21 NOTE — Progress Notes (Signed)
   Follow-Up Visit   Subjective  Mitchell [name] is a 65 y.o. male who presents for the following: Annual Exam (Mole check ). The patient presents for Upper Body Skin Exam (UBSE) for skin cancer screening and mole check.  The following portions of the chart were reviewed this encounter and updated as appropriate:   Tobacco  Allergies  Meds  Problems  Med Hx  Surg Hx  Fam Hx     Review of Systems:  No other skin or systemic complaints except as noted in HPI or Assessment and Plan.  Objective  Well appearing patient in no apparent distress; mood and affect are within normal limits.  All skin waist up examined.  Objective  chest (22): Erythematous keratotic or waxy stuck-on papule or plaque.    Assessment & Plan  Inflamed seborrheic keratosis (22) chest  Destruction of lesion - chest Complexity: simple   Destruction method: cryotherapy   Informed consent: discussed and consent obtained   Timeout:  patient name, date of birth, surgical site, and procedure verified Lesion destroyed using liquid nitrogen: Yes   Region frozen until ice ball extended beyond lesion: Yes   Outcome: patient tolerated procedure well with no complications   Post-procedure details: wound care instructions given    Skin cancer screening   Lentigines - Scattered tan macules - Due to sun exposure - Benign-appering, observe - Recommend daily broad spectrum sunscreen SPF 30+ to sun-exposed areas, reapply every 2 hours as needed. - Call for any changes  Seborrheic Keratoses - Stuck-on, waxy, tan-brown papules and/or plaques  - Benign-appearing - Discussed benign etiology and prognosis. - Observe - Call for any changes  Melanocytic Nevi - Tan-brown and/or pink-flesh-colored symmetric macules and papules - Benign appearing on exam today - Observation - Call clinic for new or changing moles - Recommend daily use of broad spectrum spf 30+ sunscreen to sun-exposed areas.   Hemangiomas - Red  papules - Discussed benign nature - Observe - Call for any changes  Actinic Damage - Chronic condition, secondary to cumulative UV/sun exposure - diffuse scaly erythematous macules with underlying dyspigmentation - Recommend daily broad spectrum sunscreen SPF 30+ to sun-exposed areas, reapply every 2 hours as needed.  - Staying in the shade or wearing long sleeves, sun glasses (UVA+UVB protection) and wide brim hats (4-inch brim around the entire circumference of the hat) are also recommended for sun protection.  - Call for new or changing lesions.  Purpura - Chronic; persistent and recurrent.  Treatable, but not curable. Arms - Violaceous macules and patches - Benign - Related to trauma, age, sun damage and/or use of blood thinners, chronic use of topical and/or oral steroids - Observe - Can use OTC arnica containing moisturizer such as Dermend Bruise Formula if desired - Call for worsening or other concerns  Skin cancer screening performed today.  Return in about 3 months (around 03/22/2021) for ISK on the back, check moles on legs and feet .  IMarye Round, CMA, am acting as scribe for Sarina Ser, MD .  Documentation: I have reviewed the above documentation for accuracy and completeness, and I agree with the above.  Sarina Ser, MD

## 2020-12-22 ENCOUNTER — Encounter: Payer: Self-pay | Admitting: Dermatology

## 2020-12-25 ENCOUNTER — Telehealth: Payer: Self-pay

## 2020-12-25 ENCOUNTER — Ambulatory Visit (INDEPENDENT_AMBULATORY_CARE_PROVIDER_SITE_OTHER): Payer: Medicare Other | Admitting: General Practice

## 2020-12-25 DIAGNOSIS — E782 Mixed hyperlipidemia: Secondary | ICD-10-CM | POA: Diagnosis not present

## 2020-12-25 DIAGNOSIS — F331 Major depressive disorder, recurrent, moderate: Secondary | ICD-10-CM

## 2020-12-25 DIAGNOSIS — I10 Essential (primary) hypertension: Secondary | ICD-10-CM | POA: Diagnosis not present

## 2020-12-25 DIAGNOSIS — F419 Anxiety disorder, unspecified: Secondary | ICD-10-CM

## 2020-12-25 NOTE — Patient Instructions (Signed)
Visit Information  PATIENT GOALS: Patient Care Plan: RNCM: Depression (Adult)    Problem Identified: RNCM: Depression Identification (Depression)   Priority: Medium    Long-Range Goal: RNCM: Depressive Symptoms Identified   Priority: Medium  Note:   Current Barriers:  . Chronic Disease Management support and education needs related to depression  . Lacks caregiver support.  Leodis [place] social connections . Does not maintain contact with provider office . Does not contact provider office for questions/concerns  Nurse Case Manager Clinical Goal(s):  Marland Kitchen Over the next 120 days, patient will verbalize understanding of plan for effective management of depression  . Over the next 120 days, patient will work with [facility] and pcp to address needs related to depression   Interventions:  . 1:1 collaboration with Valerie Roys, DO regarding development and update of comprehensive plan of care as evidenced by provider attestation and co-signature . Inter-disciplinary care team collaboration (see longitudinal plan of care) . Evaluation of current treatment plan related to depression  and patient's adherence to plan as established by provider. 12-25-2020: The patient is doing well. The patient is getting his boat ready and will be going fishing before long. He also will be helping his wife with the garden.  . Advised patient to to call the office for changes in mood/anxiety/and depression  . Provided education to patient re: effective methods for dealing with depression  . Discussed plans with patient for ongoing care management follow up and provided patient with direct contact information for care management team  Patient Goals/Self-Care Activities Over the next 120 days, patient will:  - Patient will self administer medications as prescribed Patient will attend all scheduled provider appointments Patient will call pharmacy for medication refills Patient will attend church or other social  activities Patient will continue to perform ADL's independently Patient will continue to perform IADL's independently Patient will call provider office for new concerns or questions Patient will work with BSW to address care coordination needs and will continue to work with the clinical team to address health care and disease management related needs.    - anxiety screen reviewed - depression screen reviewed - medication list reviewed - mental health treatment arranged Follow Up Plan: Telephone follow up appointment with care management team member scheduled for: 03-12-2021 at 4 pm       Task: RNCM: Identify Depressive Symptoms and Facilitate Treatment   Note:   Care Management Activities:    - anxiety screen reviewed - depression screen reviewed - medication list reviewed - mental health treatment arranged    Notes:    Patient Care Plan: RNCM: Hypertension (Adult)    Problem Identified: RNCM: Hypertension (Hypertension)   Priority: Medium    Goal: RNCM: Hypertension Monitored   Priority: Medium  Note:   Objective:  . Last practice recorded BP readings:  . BP Readings from Last 3 Encounters: .  10/27/20 . 128/87 .  06/21/20 . (!) 144/80 .  04/07/20 . 115/73 .    Marland Kitchen Most recent eGFR/CrCl: No results found for: EGFR  No components found for: CRCL Current Barriers:  Marland Kitchen Knowledge Deficits related to basic understanding of hypertension pathophysiology and self care management . Knowledge Deficits related to understanding of medications prescribed for management of hypertension . Limited Social Support . Unable to independently manage HTN . Lacks social connections . Does not maintain contact with provider office . Does not contact provider office for questions/concerns Case Manager Clinical Goal(s):  Marland Kitchen Over the next 120 days, patient  will verbalize understanding of plan for hypertension management . Over the next 120 days, patient will demonstrate improved adherence to  prescribed treatment plan for hypertension as evidenced by taking all medications as prescribed, monitoring and recording blood pressure as directed, adhering to low sodium/DASH diet . Over the next 120 days, patient will demonstrate improved health management independence as evidenced by checking blood pressure as directed and notifying PCP if SBP>150 or DBP > 90, taking all medications as prescribe, and adhering to a low sodium diet as discussed. . Over the next 120 days, patient will verbalize basic understanding of hypertension disease process and self health management plan as evidenced by compliance with heart healthy diet, and working with the CCM team to effectively manage health and well being.  Interventions:  . Collaboration with Valerie Roys, DO regarding development and update of comprehensive plan of care as evidenced by provider attestation and co-signature . Inter-disciplinary care team collaboration (see longitudinal plan of care) . Evaluation of current treatment plan related to hypertension self management and patient's adherence to plan as established by provider. 12-25-2020: The patient is doing well and denies any issues with HTN, states he is eating well and watching his sodium content.  . Provided education to patient re: stroke prevention, s/s of heart attack and stroke, DASH diet, complications of uncontrolled blood pressure . Reviewed medications with patient and discussed importance of compliance. 12-25-2020: The patient states compliance with her medications  . Discussed plans with patient for ongoing care management follow up and provided patient with direct contact information for care management team . Advised patient, providing education and rationale, to monitor blood pressure daily and record, calling PCP for findings outside established parameters.  Patient Goals/Self-Care Activities . Over the next 120 days, patient will:  - Self administers medications as  prescribed Attends all scheduled provider appointments Calls provider office for new concerns, questions, or BP outside discussed parameters Checks BP and records as discussed Follows a low sodium diet/DASH diet - blood pressure trends reviewed - depression screen reviewed - home or ambulatory blood pressure monitoring encouraged Follow Up Plan: Telephone follow up appointment with care management team member scheduled for: 03-12-2021 at 4 pm   Task: RNCM: Identify and Monitor Blood Pressure Elevation   Note:   Care Management Activities:    - blood pressure trends reviewed - depression screen reviewed - home or ambulatory blood pressure monitoring encouraged       Patient Care Plan: RNCM: HLD    Problem Identified: RNCM: HLD   Priority: Medium    Long-Range Goal: RNCM: HLD management   Priority: Medium  Note:   Current Barriers:  . Poorly controlled hyperlipidemia, complicated by HTN, depression, non-adherence to dietary restrictions.  . Current antihyperlipidemic regimen: Crestor 10 mg QD . Most recent lipid panel:  . Lab Results .  Component . Value . Date .   Marland Kitchen CHOL . 177 . 04/07/2020 .   Marland Kitchen CHOL . 172 . 09/23/2019 .   Marland Kitchen CHOL . 163 . 03/21/2019 .   Marland Kitchen Lab Results .  Component . Value . Date .   Marland Kitchen HDL . 40 . 04/07/2020 .   Marland Kitchen HDL . 38 (L) . 09/23/2019 .   Marland Kitchen HDL . 41 . 03/21/2019 .   Marland Kitchen Lab Results .  Component . Value . Date .   Marland Kitchen [place] . 73 . 04/07/2020 .   Marland Kitchen [place] . 69 . 09/23/2019 .   Marland Kitchen [place] . 58 .  03/21/2019 .   Marland Kitchen Lab Results .  Component . Value . Date .   Marland Kitchen TRIG . 405 (H) . 04/07/2020 .   Marland Kitchen TRIG . 414 (H) . 09/23/2019 .   Marland Kitchen TRIG . 320 (H) . 03/21/2019 .   Marland Kitchen No results found for: CHOLHDL . No results found for: LDLDIRECT  . ASCVD risk enhancing conditions: age >53,  HTN, former smoker  . Lacks social connections . Does not maintain contact with provider office . Does not contact provider office for questions/concerns  RN Care Manager Clinical  Goal(s):  Marland Kitchen Over the next 120 days, patient will work with Consulting civil engineer, providers, and care team towards execution of optimized self-health management plan . Over the next 120 days, patient will verbalize understanding of plan for effective management of HLD . Over the next 120 days, patient will work with pcp and RNCM  to address needs related to management of HLD   Interventions: . Collaboration with Valerie Roys, DO regarding development and update of comprehensive plan of care as evidenced by provider attestation and co-signature . Inter-disciplinary care team collaboration (see longitudinal plan of care) . Medication review performed; medication list updated in electronic medical record.  Bertram Savin care team collaboration (see longitudinal plan of care) . Referred to pharmacy team for assistance with HLD medication management . Evaluation of current treatment plan related to HLD management  and patient's adherence to plan as established by provider. 12-25-2020: The patient denies any new concerns at this time. The patient states he is stable. Getting plenty of rest and no complications from [place] in February . Advised patient to call the office for changes in conditions or questions  . Provided education to patient re: heart healthy diet  . Discussed plans with patient for ongoing care management follow up and provided patient with direct contact information for care management team   Patient Goals/Self-Care Activities: . Over the next 120days, patient will:   - call for medicine refill 2 or 3 days before it runs out - call if I am sick and can't take my medicine - keep a list of all the medicines I take; vitamins and herbals too - learn to read medicine labels - use a pillbox to sort medicine - use an alarm clock or phone to remind me to take my medicine - change to whole grain breads, cereal, pasta - drink 6 to 8 glasses of water each day - eat 5 or 6 small meals  each day - fill half the plate with nonstarchy vegetables - limit fast food meals to no more than 1 per week - prepare main meal at home 3 to 5 days each week - be open to making changes - I can manage, know and watch for signs of a heart attack - if I have chest pain, call for help - learn about small changes that will make a big difference - learn my personal risk factors - education plan reviewed and/or amended - empathy and reassurance conveyed - family/caregiver participation in learning encouraged - health literacy screen reviewed - patient's preferred learning methods utilized - privacy ensured - questions encouraged - readiness to learn monitored   Follow Up Plan: Telephone follow up appointment with care management team member scheduled for:03-12-2021 at 4 pm     Task: RNCM: HLD   Note:   Care Management Activities:    - education plan reviewed and/or amended - empathy and reassurance conveyed - family/caregiver participation in  learning encouraged - health literacy screen reviewed - patient's preferred learning methods utilized - privacy ensured - questions encouraged - readiness to learn monitored         The patient verbalized understanding of instructions, educational materials, and care plan provided today and declined offer to receive copy of patient instructions, educational materials, and care plan.   Telephone follow up appointment with care management team member scheduled for: 03-12-2021 at 345 pm  [name] RN, MSN, [facility] Mobile: [phone]

## 2020-12-25 NOTE — Chronic Care Management (AMB) (Signed)
Chronic Care Management   CCM RN Visit Note  12/25/2020 Name: Mitchell [name] MRN: [id number] DOB: 02-22-1956  Subjective: Mitchell [name] is a 65 y.o. year old male who is a primary care patient of Valerie Roys, DO. The care management team was consulted for assistance with disease management and care coordination needs.    Engaged with patient by telephone for follow up visit in response to provider referral for case management and/or care coordination services.   Consent to Services:  The patient was given information about Chronic Care Management services, agreed to services, and gave verbal consent prior to initiation of services.  Please see initial visit note for detailed documentation.   Patient agreed to services and verbal consent obtained.   Assessment: Review of patient past medical history, allergies, medications, health status, including review of consultants reports, laboratory and other test data, was performed as part of comprehensive evaluation and provision of chronic care management services.   SDOH (Social Determinants of Health) assessments and interventions performed:    CCM Care Plan  Allergies  Allergen Reactions  . Haloperidol Decanoate Other (See Comments)    Muscle pain  . Atorvastatin Other (See Comments)    myalgias  . Codeine Nausea And Vomiting    Outpatient Encounter Medications as of 12/25/2020  Medication Sig  . aspirin EC 81 MG tablet Take 1 tablet (81 mg total) by mouth daily. (Patient not taking: Reported on 10/27/2020)  . cyclobenzaprine (FLEXERIL) 5 MG tablet Take 1 tablet (5 mg total) by mouth at bedtime as needed for muscle spasms. (Patient not taking: Reported on 10/27/2020)  . FLOVENT HFA 220 MCG/ACT inhaler Inhale 2 puffs into the lungs daily. (Patient not taking: Reported on 10/27/2020)  . hydrOXYzine (ATARAX/VISTARIL) 25 MG tablet Take 1 tablet (25 mg total) by mouth 3 (three) times daily as needed for anxiety.  Marland Kitchen levothyroxine (SYNTHROID) 50  MCG tablet TAKE 1 TABLET BY MOUTH  DAILY BEFORE BREAKFAST  . lisinopril (ZESTRIL) 5 MG tablet TAKE 1 AND 1/2 TABLETS BY  MOUTH DAILY  . loratadine (CLARITIN) 10 MG tablet Take 10 mg by mouth daily.  . Multiple Vitamin (MULTI-VITAMINS) TABS Take by mouth.  . naproxen sodium (ALEVE) 220 MG tablet Take 220 mg by mouth daily as needed (patient states he takes about 2 times a week).  . rosuvastatin (CRESTOR) 10 MG tablet TAKE 1 TABLET BY MOUTH  DAILY  . tadalafil (CIALIS) 20 MG tablet TAKE 1/2 TO 1 TABLET BY MOUTH EVERY OTHER DAY AS NEEDED FOR ERECTILE DYSFUNCTION  . traMADol (ULTRAM) 50 MG tablet Take 1 tablet (50 mg total) by mouth every 6 (six) hours as needed. (Patient not taking: Reported on 10/27/2020)   No facility-administered encounter medications on file as of 12/25/2020.    Patient Active Problem List   Diagnosis Date Noted  . Special screening for malignant neoplasms, colon   . Problems with swallowing and mastication   . Gastritis   . Acromioclavicular joint arthritis 04/13/2016  . Swollen testicle 08/14/2015  . BPH with obstruction/lower urinary tract symptoms 08/14/2015  . Hyperlipidemia 07/22/2015  . Hypothyroidism 07/17/2015  . Scrotal swelling 07/16/2015  . Hydrocele, right 07/08/2015  . Drug-induced erectile dysfunction 07/08/2015  . IFG (impaired fasting glucose)   . Schizophrenia ([place])   . Insomnia   . Anxiety   . Hypertension   . Depression   . Hepatitis C   . Developmental delay 01/02/2013    Conditions to be addressed/monitored:HTN, HLD, Anxiety and Depression  Care Plan : RNCM: Depression (Adult)  Updates made by Vanita Ingles since 12/25/2020 12:00 AM    Problem: RNCM: Depression Identification (Depression)   Priority: Medium    Long-Range Goal: RNCM: Depressive Symptoms Identified   Priority: Medium  Note:   Current Barriers:  . Chronic Disease Management support and education needs related to depression  . Lacks caregiver support.  Leodis [place] social  connections . Does not maintain contact with provider office . Does not contact provider office for questions/concerns  Nurse Case Manager Clinical Goal(s):  Marland Kitchen Over the next 120 days, patient will verbalize understanding of plan for effective management of depression  . Over the next 120 days, patient will work with [facility] and pcp to address needs related to depression   Interventions:  . 1:1 collaboration with Valerie Roys, DO regarding development and update of comprehensive plan of care as evidenced by provider attestation and co-signature . Inter-disciplinary care team collaboration (see longitudinal plan of care) . Evaluation of current treatment plan related to depression  and patient's adherence to plan as established by provider. 12-25-2020: The patient is doing well. The patient is getting his boat ready and will be going fishing before long. He also will be helping his wife with the garden.  . Advised patient to to call the office for changes in mood/anxiety/and depression  . Provided education to patient re: effective methods for dealing with depression  . Discussed plans with patient for ongoing care management follow up and provided patient with direct contact information for care management team  Patient Goals/Self-Care Activities Over the next 120 days, patient will:  - Patient will self administer medications as prescribed Patient will attend all scheduled provider appointments Patient will call pharmacy for medication refills Patient will attend church or other social activities Patient will continue to perform ADL's independently Patient will continue to perform IADL's independently Patient will call provider office for new concerns or questions Patient will work with BSW to address care coordination needs and will continue to work with the clinical team to address health care and disease management related needs.    - anxiety screen reviewed - depression screen  reviewed - medication list reviewed - mental health treatment arranged Follow Up Plan: Telephone follow up appointment with care management team member scheduled for: 03-12-2021 at 4 pm       Care Plan : RNCM: Hypertension (Adult)  Updates made by Vanita Ingles since 12/25/2020 12:00 AM    Problem: RNCM: Hypertension (Hypertension)   Priority: Medium    Goal: RNCM: Hypertension Monitored   Priority: Medium  Note:   Objective:  . Last practice recorded BP readings:  . BP Readings from Last 3 Encounters: .  10/27/20 . 128/87 .  06/21/20 . (!) 144/80 .  04/07/20 . 115/73 .    Marland Kitchen Most recent eGFR/CrCl: No results found for: EGFR  No components found for: CRCL Current Barriers:  Marland Kitchen Knowledge Deficits related to basic understanding of hypertension pathophysiology and self care management . Knowledge Deficits related to understanding of medications prescribed for management of hypertension . Limited Social Support . Unable to independently manage HTN . Lacks social connections . Does not maintain contact with provider office . Does not contact provider office for questions/concerns Case Manager Clinical Goal(s):  Marland Kitchen Over the next 120 days, patient will verbalize understanding of plan for hypertension management . Over the next 120 days, patient will demonstrate improved adherence to prescribed treatment plan for hypertension as evidenced  by taking all medications as prescribed, monitoring and recording blood pressure as directed, adhering to low sodium/DASH diet . Over the next 120 days, patient will demonstrate improved health management independence as evidenced by checking blood pressure as directed and notifying PCP if SBP>150 or DBP > 90, taking all medications as prescribe, and adhering to a low sodium diet as discussed. . Over the next 120 days, patient will verbalize basic understanding of hypertension disease process and self health management plan as evidenced by compliance with  heart healthy diet, and working with the CCM team to effectively manage health and well being.  Interventions:  . Collaboration with Valerie Roys, DO regarding development and update of comprehensive plan of care as evidenced by provider attestation and co-signature . Inter-disciplinary care team collaboration (see longitudinal plan of care) . Evaluation of current treatment plan related to hypertension self management and patient's adherence to plan as established by provider. 12-25-2020: The patient is doing well and denies any issues with HTN, states he is eating well and watching his sodium content.  . Provided education to patient re: stroke prevention, s/s of heart attack and stroke, DASH diet, complications of uncontrolled blood pressure . Reviewed medications with patient and discussed importance of compliance. 12-25-2020: The patient states compliance with her medications  . Discussed plans with patient for ongoing care management follow up and provided patient with direct contact information for care management team . Advised patient, providing education and rationale, to monitor blood pressure daily and record, calling PCP for findings outside established parameters.  Patient Goals/Self-Care Activities . Over the next 120 days, patient will:  - Self administers medications as prescribed Attends all scheduled provider appointments Calls provider office for new concerns, questions, or BP outside discussed parameters Checks BP and records as discussed Follows a low sodium diet/DASH diet - blood pressure trends reviewed - depression screen reviewed - home or ambulatory blood pressure monitoring encouraged Follow Up Plan: Telephone follow up appointment with care management team member scheduled for: 03-12-2021 at 4 pm   Care Plan : RNCM: HLD  Updates made by Vanita Ingles since 12/25/2020 12:00 AM    Problem: RNCM: HLD   Priority: Medium    Long-Range Goal: RNCM: HLD management    Priority: Medium  Note:   Current Barriers:  . Poorly controlled hyperlipidemia, complicated by HTN, depression, non-adherence to dietary restrictions.  . Current antihyperlipidemic regimen: Crestor 10 mg QD . Most recent lipid panel:  . Lab Results .  Component . Value . Date .   Marland Kitchen CHOL . 177 . 04/07/2020 .   Marland Kitchen CHOL . 172 . 09/23/2019 .   Marland Kitchen CHOL . 163 . 03/21/2019 .   Marland Kitchen Lab Results .  Component . Value . Date .   Marland Kitchen HDL . 40 . 04/07/2020 .   Marland Kitchen HDL . 38 (L) . 09/23/2019 .   Marland Kitchen HDL . 41 . 03/21/2019 .   Marland Kitchen Lab Results .  Component . Value . Date .   Marland Kitchen [place] . 73 . 04/07/2020 .   Marland Kitchen [place] . 69 . 09/23/2019 .   Marland Kitchen [place] . 58 . 03/21/2019 .   Marland Kitchen Lab Results .  Component . Value . Date .   Marland Kitchen TRIG . 405 (H) . 04/07/2020 .   Marland Kitchen TRIG . 414 (H) . 09/23/2019 .   Marland Kitchen TRIG . 320 (H) . 03/21/2019 .   Marland Kitchen No results found for: CHOLHDL . No results found for: LDLDIRECT  .  ASCVD risk enhancing conditions: age >71,  HTN, former smoker  . Lacks social connections . Does not maintain contact with provider office . Does not contact provider office for questions/concerns  RN Care Manager Clinical Goal(s):  Marland Kitchen Over the next 120 days, patient will work with Consulting civil engineer, providers, and care team towards execution of optimized self-health management plan . Over the next 120 days, patient will verbalize understanding of plan for effective management of HLD . Over the next 120 days, patient will work with pcp and RNCM  to address needs related to management of HLD   Interventions: . Collaboration with Valerie Roys, DO regarding development and update of comprehensive plan of care as evidenced by provider attestation and co-signature . Inter-disciplinary care team collaboration (see longitudinal plan of care) . Medication review performed; medication list updated in electronic medical record.  Bertram Savin care team collaboration (see longitudinal plan of care) . Referred to pharmacy  team for assistance with HLD medication management . Evaluation of current treatment plan related to HLD management  and patient's adherence to plan as established by provider. 12-25-2020: The patient denies any new concerns at this time. The patient states he is stable. Getting plenty of rest and no complications from [place] in February . Advised patient to call the office for changes in conditions or questions  . Provided education to patient re: heart healthy diet  . Discussed plans with patient for ongoing care management follow up and provided patient with direct contact information for care management team   Patient Goals/Self-Care Activities: . Over the next 120days, patient will:   - call for medicine refill 2 or 3 days before it runs out - call if I am sick and can't take my medicine - keep a list of all the medicines I take; vitamins and herbals too - learn to read medicine labels - use a pillbox to sort medicine - use an alarm clock or phone to remind me to take my medicine - change to whole grain breads, cereal, pasta - drink 6 to 8 glasses of water each day - eat 5 or 6 small meals each day - fill half the plate with nonstarchy vegetables - limit fast food meals to no more than 1 per week - prepare main meal at home 3 to 5 days each week - be open to making changes - I can manage, know and watch for signs of a heart attack - if I have chest pain, call for help - learn about small changes that will make a big difference - learn my personal risk factors - education plan reviewed and/or amended - empathy and reassurance conveyed - family/caregiver participation in learning encouraged - health literacy screen reviewed - patient's preferred learning methods utilized - privacy ensured - questions encouraged - readiness to learn monitored   Follow Up Plan: Telephone follow up appointment with care management team member scheduled for:03-12-2021 at 4 pm       Plan:Telephone  follow up appointment with care management team member scheduled for:  03-12-2021 at 345 pm  [name] RN, MSN, [facility] Mobile: [phone]

## 2021-01-05 ENCOUNTER — Other Ambulatory Visit: Payer: Self-pay | Admitting: Family Medicine

## 2021-01-07 ENCOUNTER — Other Ambulatory Visit: Payer: Self-pay

## 2021-01-07 ENCOUNTER — Ambulatory Visit: Payer: Medicare Other | Admitting: Dermatology

## 2021-01-07 DIAGNOSIS — L820 Inflamed seborrheic keratosis: Secondary | ICD-10-CM | POA: Diagnosis not present

## 2021-01-07 DIAGNOSIS — L821 Other seborrheic keratosis: Secondary | ICD-10-CM

## 2021-01-07 DIAGNOSIS — L578 Other skin changes due to chronic exposure to nonionizing radiation: Secondary | ICD-10-CM | POA: Diagnosis not present

## 2021-01-07 NOTE — Patient Instructions (Signed)

## 2021-01-07 NOTE — Progress Notes (Signed)
   Follow-Up Visit   Subjective  Mitchell [name] is a 65 y.o. male who presents for the following: Follow-up (ISK follow up of chest treated with LN2. Has several spots on back that need treating today).  The spots are irritating and symptomatic.  The following portions of the chart were reviewed this encounter and updated as appropriate:   Tobacco  Allergies  Meds  Problems  Med Hx  Surg Hx  Fam Hx     Review of Systems:  No other skin or systemic complaints except as noted in HPI or Assessment and Plan.  Objective  Well appearing patient in no apparent distress; mood and affect are within normal limits.  A focused examination was performed including back. Relevant physical exam findings are noted in the Assessment and Plan.  Objective  Back (24): Erythematous keratotic or waxy stuck-on papule or plaque mostly of upper back.   Assessment & Plan    Actinic Damage - chronic, secondary to cumulative UV radiation exposure/sun exposure over time - diffuse scaly erythematous macules with underlying dyspigmentation - Recommend daily broad spectrum sunscreen SPF 30+ to sun-exposed areas, reapply every 2 hours as needed.  - Recommend staying in the shade or wearing long sleeves, sun glasses (UVA+UVB protection) and wide brim hats (4-inch brim around the entire circumference of the hat). - Call for new or changing lesions.  Seborrheic Keratoses - Stuck-on, waxy, tan-brown papules and/or plaques  - Benign-appearing - Discussed benign etiology and prognosis. - Observe - Call for any changes  Inflamed seborrheic keratosis (24) Back  Destruction of lesion - Back Complexity: simple   Destruction method: cryotherapy   Informed consent: discussed and consent obtained   Timeout:  patient name, date of birth, surgical site, and procedure verified Lesion destroyed using liquid nitrogen: Yes   Region frozen until ice ball extended beyond lesion: Yes   Outcome: patient tolerated  procedure well with no complications   Post-procedure details: wound care instructions given    Return for 3-4 weeks , ISK follow up.   I, Ashok Cordia, CMA, am acting as scribe for Sarina Ser, MD .  Documentation: I have reviewed the above documentation for accuracy and completeness, and I agree with the above.  Sarina Ser, MD

## 2021-01-11 ENCOUNTER — Encounter: Payer: Self-pay | Admitting: Dermatology

## 2021-01-18 ENCOUNTER — Other Ambulatory Visit: Payer: Self-pay | Admitting: Family Medicine

## 2021-01-22 ENCOUNTER — Other Ambulatory Visit: Payer: Self-pay | Admitting: Family Medicine

## 2021-01-22 DIAGNOSIS — I10 Essential (primary) hypertension: Secondary | ICD-10-CM

## 2021-01-23 NOTE — Telephone Encounter (Signed)
Requested medication (s) are due for refill today: yes  Requested medication (s) are on the active medication list: yes  Last refill:  10/30/20 #135  Future visit scheduled: yes 04/08/21  Notes to clinic:  overdue lab work   Requested Prescriptions  Pending Prescriptions Disp Refills   lisinopril (ZESTRIL) 5 MG tablet [Pharmacy Med Name: Lisinopril 5 MG Oral Tablet] 135 tablet     Sig: TAKE 1 AND 1/2 TABLETS BY  MOUTH DAILY MUST ATTEND  UPCOMING APPT IN APRIL FOR  FURTHER REFILLS.      Cardiovascular:  ACE Inhibitors Failed - 01/22/2021 10:51 PM      Failed - Cr in normal range and within 180 days    Creatinine  Date Value Ref Range Status  09/07/2012 1.20 0.60 - 1.30 mg/dL Final   Creatinine, Ser  Date Value Ref Range Status  04/07/2020 1.15 0.76 - 1.27 mg/dL Final          Failed - K in normal range and within 180 days    Potassium  Date Value Ref Range Status  04/07/2020 4.8 3.5 - 5.2 mmol/L Final  09/07/2012 3.7 3.5 - 5.1 mmol/L Final          Failed - Valid encounter within last 6 months    Recent Outpatient Visits           2 months ago [place], Megan P, DO   9 months ago Routine general medical examination at a health care facility   [facility], [place], DO   11 months ago Muscle cramp   [place], [place], DO   1 year ago Essential hypertension   [place], [place], DO   1 year ago Routine general medical examination at a health care facility   [place], [place], DO       Future Appointments             In 2 days  [facility], [place]   In 4 days Ralene Bathe, MD [place]   In 2 months [name], Megan P, DO [place], [place] - Patient is not pregnant      Passed - Last BP in normal range    BP Readings from Last 1 Encounters:  10/27/20 128/87            Signed Prescriptions Disp Refills   rosuvastatin (CRESTOR) 10 MG tablet 90 tablet 0    Sig: TAKE 1 TABLET BY MOUTH  DAILY      Cardiovascular:  Antilipid - Statins Failed - 01/22/2021 10:51 PM      Failed - Triglycerides in normal range and within 360 days    Triglycerides  Date Value Ref Range Status  04/07/2020 405 (H) 0 - 149 mg/dL Final   Triglycerides Piccolo,Waived  Date Value Ref Range Status  03/31/2016 338 (H) <150 mg/dL Final    Comment:                            Normal                   <150                         Borderline High  150 - 199                         High                200 - 499                         Very High                >499           Passed - Total Cholesterol in normal range and within 360 days    Cholesterol, Total  Date Value Ref Range Status  04/07/2020 177 100 - 199 mg/dL Final   Cholesterol Piccolo, Waived  Date Value Ref Range Status  03/31/2016 155 <200 mg/dL Final    Comment:                            Desirable                <200                         Borderline High      200- 239                         High                     >239           Passed - LDL in normal range and within 360 days    LDL Chol Calc (NIH)  Date Value Ref Range Status  04/07/2020 73 0 - 99 mg/dL Final          Passed - HDL in normal range and within 360 days    HDL  Date Value Ref Range Status  04/07/2020 40 >39 mg/dL Final          Passed - Patient is not pregnant      Passed - Valid encounter within last 12 months    Recent Outpatient Visits           2 months ago COVID-19   [facility], Megan P, DO   9 months ago Routine general medical examination at a health care facility   [facility], [place], DO   11 months ago Muscle cramp   [place], [place], DO   1 year ago Essential hypertension   [place], [place], DO   1 year ago Routine  general medical examination at a health care facility   [facility] Valerie Roys, DO       Future Appointments             In 2 days  [facility], [place]   In 4 days Ralene Bathe, MD [place]   In 2 months [name], Barb Merino, [place], [place]

## 2021-01-23 NOTE — Telephone Encounter (Signed)
Requested Prescriptions  Pending Prescriptions Disp Refills  . lisinopril (ZESTRIL) 5 MG tablet [Pharmacy Med Name: Lisinopril 5 MG Oral Tablet] 135 tablet     Sig: TAKE 1 AND 1/2 TABLETS BY  MOUTH DAILY MUST ATTEND  UPCOMING APPT IN APRIL FOR  FURTHER REFILLS.     Cardiovascular:  ACE Inhibitors Failed - 01/22/2021 10:51 PM      Failed - Cr in normal range and within 180 days    Creatinine  Date Value Ref Range Status  09/07/2012 1.20 0.60 - 1.30 mg/dL Final   Creatinine, Ser  Date Value Ref Range Status  04/07/2020 1.15 0.76 - 1.27 mg/dL Final         Failed - K in normal range and within 180 days    Potassium  Date Value Ref Range Status  04/07/2020 4.8 3.5 - 5.2 mmol/L Final  09/07/2012 3.7 3.5 - 5.1 mmol/L Final         Failed - Valid encounter within last 6 months    Recent Outpatient Visits          2 months ago [place], Megan P, DO   9 months ago Routine general medical examination at a health care facility   [facility], [facility], DO   11 months ago Muscle cramp   [place], [place], DO   1 year ago Essential hypertension   [place], [place], DO   1 year ago Routine general medical examination at a health care facility   [facility] Valerie Roys, DO      Future Appointments            In 2 days  [facility], [place]   In 4 days Ralene Bathe, MD [place]   In 2 months [place], DO [place], [place] - Patient is not pregnant      Passed - Last BP in normal range    BP Readings from Last 1 Encounters:  10/27/20 128/87         . rosuvastatin (CRESTOR) 10 MG tablet [Pharmacy Med Name: Rosuvastatin Calcium 10 MG Oral Tablet] 90 tablet 0    Sig: TAKE 1 TABLET BY MOUTH  DAILY     Cardiovascular:  Antilipid - Statins Failed - 01/22/2021 10:51 PM      Failed - Triglycerides in  normal range and within 360 days    Triglycerides  Date Value Ref Range Status  04/07/2020 405 (H) 0 - 149 mg/dL Final   Triglycerides Piccolo,Waived  Date Value Ref Range Status  03/31/2016 338 (H) <150 mg/dL Final    Comment:                            Normal                   <150                         Borderline High     150 - 199                         High                200 - 499  Very High                >499          Passed - Total Cholesterol in normal range and within 360 days    Cholesterol, Total  Date Value Ref Range Status  04/07/2020 177 100 - 199 mg/dL Final   Cholesterol Piccolo, Waived  Date Value Ref Range Status  03/31/2016 155 <200 mg/dL Final    Comment:                            Desirable                <200                         Borderline High      200- 239                         High                     >239          Passed - LDL in normal range and within 360 days    LDL Chol Calc (NIH)  Date Value Ref Range Status  04/07/2020 73 0 - 99 mg/dL Final         Passed - HDL in normal range and within 360 days    HDL  Date Value Ref Range Status  04/07/2020 40 >39 mg/dL Final         Passed - Patient is not pregnant      Passed - Valid encounter within last 12 months    Recent Outpatient Visits          2 months ago COVID-19   [facility], Megan P, DO   9 months ago Routine general medical examination at a health care facility   [facility], [place], DO   11 months ago Muscle cramp   [place], [place], DO   1 year ago Essential hypertension   [place], [place], DO   1 year ago Routine general medical examination at a health care facility   [facility] Valerie Roys, DO      Future Appointments            In 2 days  [facility], [place]   In 4 days Ralene Bathe, MD [place]    In 2 months [name], Barb Merino, DO [facility], [place]

## 2021-01-25 ENCOUNTER — Ambulatory Visit (INDEPENDENT_AMBULATORY_CARE_PROVIDER_SITE_OTHER): Payer: Medicare Other

## 2021-01-25 VITALS — Ht 63.0 in | Wt 165.0 lb

## 2021-01-25 DIAGNOSIS — Z0000 Encounter for general adult medical examination without abnormal findings: Secondary | ICD-10-CM

## 2021-01-25 NOTE — Telephone Encounter (Signed)
Overdue for appointment 

## 2021-01-25 NOTE — Telephone Encounter (Signed)
Scheduled 7/21

## 2021-01-25 NOTE — Patient Instructions (Signed)
Mitchell [name] , Thank you for taking time to come for your Medicare Wellness Visit. I appreciate your ongoing commitment to your health goals. Please review the following plan we discussed and let me know if I can assist you in the future.   Screening recommendations/referrals: Colonoscopy: completed 06/07/2016 Recommended yearly ophthalmology/optometry visit for glaucoma screening and checkup Recommended yearly dental visit for hygiene and checkup  Vaccinations: Influenza vaccine: completed 06/26/2020, due 04/19/2021 Pneumococcal vaccine: completed 02/14/2020 Tdap vaccine: completed 07/27/2018, due 07/27/2028 Shingles vaccine: discussed   Covid-19:  01/04/2021, 07/16/2020, 12/12/2019  Advanced directives: Advance directive discussed with you today.   Conditions/risks identified: none  Next appointment: Follow up in one year for your annual wellness visit.   Preventive Care 7 Years and Older, Male Preventive care refers to lifestyle choices and visits with your health care provider that can promote health and wellness. What does preventive care include?  A yearly physical exam. This is also called an annual well check.  Dental exams once or twice a year.  Routine eye exams. Ask your health care provider how often you should have your eyes checked.  Personal lifestyle choices, including:  Daily care of your teeth and gums.  Regular physical activity.  Eating a healthy diet.  Avoiding tobacco and drug use.  Limiting alcohol use.  Practicing safe sex.  Taking low doses of aspirin every day.  Taking vitamin and mineral supplements as recommended by your health care provider. What happens during an annual well check? The services and screenings done by your health care provider during your annual well check will depend on your age, overall health, lifestyle risk factors, and family history of disease. Counseling  Your health care provider may ask you questions about your:  Alcohol  use.  Tobacco use.  Drug use.  Emotional well-being.  Home and relationship well-being.  Sexual activity.  Eating habits.  History of falls.  Memory and ability to understand (cognition).  Work and work Statistician. Screening  You may have the following tests or measurements:  Height, weight, and BMI.  Blood pressure.  Lipid and cholesterol levels. These may be checked every 5 years, or more frequently if you are over 35 years old.  Skin check.  Lung cancer screening. You may have this screening every year starting at age 62 if you have a 30-pack-year history of smoking and currently smoke or have quit within the past 15 years.  Fecal occult blood test (FOBT) of the stool. You may have this test every year starting at age 77.  Flexible sigmoidoscopy or colonoscopy. You may have a sigmoidoscopy every 5 years or a colonoscopy every 10 years starting at age 81.  Prostate cancer screening. Recommendations will vary depending on your family history and other risks.  Hepatitis C blood test.  Hepatitis B blood test.  Sexually transmitted disease (STD) testing.  Diabetes screening. This is done by checking your blood sugar (glucose) after you have not eaten for a while (fasting). You may have this done every 1-3 years.  Abdominal aortic aneurysm (AAA) screening. You may need this if you are a current or former smoker.  Osteoporosis. You may be screened starting at age 58 if you are at high risk. Talk with your health care provider about your test results, treatment options, and if necessary, the need for more tests. Vaccines  Your health care provider may recommend certain vaccines, such as:  Influenza vaccine. This is recommended every year.  Tetanus, diphtheria, and acellular pertussis (Tdap,  Td) vaccine. You may need a Td booster every 10 years.  Zoster vaccine. You may need this after age 41.  Pneumococcal 13-valent conjugate (PCV13) vaccine. One dose is  recommended after age 96.  Pneumococcal polysaccharide (PPSV23) vaccine. One dose is recommended after age 6. Talk to your health care provider about which screenings and vaccines you need and how often you need them. This information is not intended to replace advice given to you by your health care provider. Make sure you discuss any questions you have with your health care provider. Document Released: 10/02/2015 Document Revised: 05/25/2016 Document Reviewed: 07/07/2015 Elsevier Interactive Patient Education  2017 [place] Prevention in the Home Falls can cause injuries. They can happen to people of all ages. There are many things you can do to make your home safe and to help prevent falls. What can I do on the outside of my home?  Regularly fix the edges of walkways and driveways and fix any cracks.  Remove anything that might make you trip as you walk through a door, such as a raised step or threshold.  Trim any bushes or trees on the path to your home.  Use bright outdoor lighting.  Clear any walking paths of anything that might make someone trip, such as rocks or tools.  Regularly check to see if handrails are loose or broken. Make sure that both sides of any steps have handrails.  Any raised decks and porches should have guardrails on the edges.  Have any leaves, snow, or ice cleared regularly.  Use sand or salt on walking paths during winter.  Clean up any spills in your garage right away. This includes oil or grease spills. What can I do in the bathroom?  Use night lights.  Install grab bars by the toilet and in the tub and shower. Do not use towel bars as grab bars.  Use non-skid mats or decals in the tub or shower.  If you need to sit down in the shower, use a plastic, non-slip stool.  Keep the floor dry. Clean up any water that spills on the floor as soon as it happens.  Remove soap buildup in the tub or shower regularly.  Attach bath mats  securely with double-sided non-slip rug tape.  Do not have throw rugs and other things on the floor that can make you trip. What can I do in the bedroom?  Use night lights.  Make sure that you have a light by your bed that is easy to reach.  Do not use any sheets or blankets that are too big for your bed. They should not hang down onto the floor.  Have a firm chair that has side arms. You can use this for support while you get dressed.  Do not have throw rugs and other things on the floor that can make you trip. What can I do in the kitchen?  Clean up any spills right away.  Avoid walking on wet floors.  Keep items that you use a lot in easy-to-reach places.  If you need to reach something above you, use a strong step stool that has a grab bar.  Keep electrical cords out of the way.  Do not use floor polish or wax that makes floors slippery. If you must use wax, use non-skid floor wax.  Do not have throw rugs and other things on the floor that can make you trip. What can I do with my stairs?  Do not  leave any items on the stairs.  Make sure that there are handrails on both sides of the stairs and use them. Fix handrails that are broken or loose. Make sure that handrails are as long as the stairways.  Check any carpeting to make sure that it is firmly attached to the stairs. Fix any carpet that is loose or worn.  Avoid having throw rugs at the top or bottom of the stairs. If you do have throw rugs, attach them to the floor with carpet tape.  Make sure that you have a light switch at the top of the stairs and the bottom of the stairs. If you do not have them, ask someone to add them for you. What else can I do to help prevent falls?  Wear shoes that:  Do not have high heels.  Have rubber bottoms.  Are comfortable and fit you well.  Are closed at the toe. Do not wear sandals.  If you use a stepladder:  Make sure that it is fully opened. Do not climb a closed  stepladder.  Make sure that both sides of the stepladder are locked into place.  Ask someone to hold it for you, if possible.  Clearly mark and make sure that you can see:  Any grab bars or handrails.  First and last steps.  Where the edge of each step is.  Use tools that help you move around (mobility aids) if they are needed. These include:  Canes.  Walkers.  Scooters.  Crutches.  Turn on the lights when you go into a dark area. Replace any light bulbs as soon as they burn out.  Set up your furniture so you have a clear path. Avoid moving your furniture around.  If any of your floors are uneven, fix them.  If there are any pets around you, be aware of where they are.  Review your medicines with your doctor. Some medicines can make you feel dizzy. This can increase your chance of falling. Ask your doctor what other things that you can do to help prevent falls. This information is not intended to replace advice given to you by your health care provider. Make sure you discuss any questions you have with your health care provider. Document Released: 07/02/2009 Document Revised: 02/11/2016 Document Reviewed: 10/10/2014 Elsevier Interactive Patient Education  2017 [employer].

## 2021-01-25 NOTE — Progress Notes (Signed)
I connected with Mitchell [name] today by telephone and verified that I am speaking with the correct person using two identifiers. Location patient: home Location provider: work Persons participating in the virtual visit: Haidar, Muse LPN.   I discussed the limitations, risks, security and privacy concerns of performing an evaluation and management service by telephone and the availability of in person appointments. I also discussed with the patient that there may be a patient responsible charge related to this service. The patient expressed understanding and verbally consented to this telephonic visit.    Interactive audio and video telecommunications were attempted between this provider and patient, however failed, due to patient having technical difficulties OR patient did not have access to video capability.  We continued and completed visit with audio only.     Vital signs may be patient reported or missing.  Subjective:   Mitchell [name] is a 65 y.o. male who presents for Medicare Annual/Subsequent preventive examination.  Review of Systems     Cardiac Risk Factors include: advanced age (>29mn, >>31women);male gender;sedentary lifestyle     Objective:    Today's Vitals   01/25/21 1516  Weight: 165 lb (74.8 kg)  Height: '5\' 3"'  (1.6 m)   Body mass index is 29.23 kg/m.  Advanced Directives 01/25/2021 06/21/2020 03/24/2020 01/22/2020 01/21/2019 01/19/2019 01/11/2019  Does Patient Have a Medical Advance Directive? No No No Yes;No Yes No No  Type of Advance Directive - - - - Living will;Healthcare Power of Attorney - -  [place] of [place]? - - - - No - copy requested - -  Would patient like information on creating a medical advance directive? - No - Patient declined No - Patient declined - - - -    Current Medications (verified) Outpatient Encounter Medications as of 01/25/2021  Medication Sig  . FLOVENT HFA 220 MCG/ACT inhaler Inhale 2 puffs into the  lungs daily.  . hydrOXYzine (ATARAX/VISTARIL) 25 MG tablet Take 1 tablet (25 mg total) by mouth 3 (three) times daily as needed for anxiety.  .Marland Kitchenlevothyroxine (SYNTHROID) 50 MCG tablet TAKE 1 TABLET BY MOUTH  DAILY BEFORE BREAKFAST  . lisinopril (ZESTRIL) 5 MG tablet TAKE 1 AND 1/2 TABLETS BY  MOUTH DAILY  . loratadine (CLARITIN) 10 MG tablet Take 10 mg by mouth daily.  . Multiple Vitamin (MULTI-VITAMINS) TABS Take by mouth.  . naproxen sodium (ALEVE) 220 MG tablet Take 220 mg by mouth daily as needed (patient states he takes about 2 times a week).  . rosuvastatin (CRESTOR) 10 MG tablet TAKE 1 TABLET BY MOUTH  DAILY  . tadalafil (CIALIS) 20 MG tablet TAKE 0.5 TO 1 TABLET BY MOUTH EVERY OTHER DAY AS NEEDED FOR FOR ERECTILE DYSFUNCTION  . aspirin EC 81 MG tablet Take 1 tablet (81 mg total) by mouth daily. (Patient not taking: No sig reported)  . cyclobenzaprine (FLEXERIL) 5 MG tablet Take 1 tablet (5 mg total) by mouth at bedtime as needed for muscle spasms. (Patient not taking: No sig reported)  . traMADol (ULTRAM) 50 MG tablet Take 1 tablet (50 mg total) by mouth every 6 (six) hours as needed. (Patient not taking: No sig reported)   No facility-administered encounter medications on file as of 01/25/2021.    Allergies (verified) Haloperidol decanoate, Atorvastatin, and Codeine   History: Past Medical History:  Diagnosis Date  . Anxiety   . [place]  . Depression   . Epididymitis   .  Hepatitis C    Tested positive, treated now told that he does not have it  . Hydrocele   . Hypertension   . IFG (impaired fasting glucose)   . Insomnia   . Kidney stone   . Schizophrenia [facility])    Past Surgical History:  Procedure Laterality Date  . ANKLE FRACTURE SURGERY    . COLONOSCOPY  2010  . COLONOSCOPY WITH PROPOFOL  06/07/2016   Procedure: COLONOSCOPY WITH PROPOFOL;  Surgeon: [name], MD;  Location: [facility] ENDOSCOPY;  Service: Endoscopy;;  . ESOPHAGOGASTRODUODENOSCOPY (EGD)  WITH PROPOFOL N/A 06/07/2016   Procedure: ESOPHAGOGASTRODUODENOSCOPY (EGD) WITH PROPOFOL;  Surgeon: [name], MD;  Location: [facility] ENDOSCOPY;  Service: Endoscopy;  Laterality: N/A;  . HERNIA REPAIR  2011   X 2   Family History  Problem Relation Age of Onset  . Diabetes Mother   . Alzheimer's disease Father   . Diabetes Brother   . Diabetes Maternal Grandfather    Social History   Socioeconomic History  . Marital status: Married    Spouse name: Otila Kluver  . Number of children: 2  . Years of education: 9  . Highest education level: 9th grade  Occupational History  . Occupation: retired   Tobacco Use  . Smoking status: Former Smoker    Quit date: 07/06/1995    Years since quitting: 25.5  . Smokeless tobacco: Former Network engineer  . Vaping Use: Never used  Substance and Sexual Activity  . Alcohol use: Not Currently    Comment: remote history, quit 20+ years ago (1 beer q 2-3 days)  . Drug use: No    Comment: remote history, quit 20= years ago  . Sexual activity: Yes  Other Topics Concern  . Not on file  Social History Narrative  . Not on file   Social Determinants of Health   Financial Resource Strain: Low Risk   . Difficulty of Paying Living Expenses: Not hard at all  Food Insecurity: No Food Insecurity  . Worried About Charity fundraiser in the Last Year: Never true  . Ran Out of Food in the Last Year: Never true  Transportation Needs: No Transportation Needs  . Lack of Transportation (Medical): No  . Lack of Transportation (Non-Medical): No  Physical Activity: Inactive  . Days of Exercise per Week: 0 days  . Minutes of Exercise per Session: 0 min  Stress: No Stress Concern Present  . Feeling of Stress : Not at all  Social Connections: Not on file    Tobacco Counseling Counseling given: Not Answered   Clinical Intake:  Pre-visit preparation completed: Yes  Pain : No/denies pain     Nutritional Status: BMI 25 -29 Overweight Nutritional Risks:  None Diabetes: No  How often do you need to have someone help you when you read instructions, pamphlets, or other written materials from your doctor or pharmacy?: 1 - Never What is the last grade level you completed in school?: 8th grade  Diabetic? no  Interpreter Needed?: No  Information entered by :: NAllen LPN   Activities of Daily Living In your present state of health, do you have any difficulty performing the following activities: 01/25/2021  Hearing? N  Vision? N  Difficulty concentrating or making decisions? N  Walking or climbing stairs? N  Dressing or bathing? N  Doing errands, shopping? N  Preparing Food and eating ? N  Using the Toilet? N  In the past six months, have you accidently leaked urine? N  Do you have problems with loss of bowel control? N  Managing your Medications? N  Managing your Finances? N  Housekeeping or managing your Housekeeping? N  Some recent data might be hidden    Patient Care Team: Valerie Roys, DO as PCP - General (Family Medicine) Greg Cutter, LCSW as [facility] [employer] (Licensed Clinical Social Worker) Vanita Ingles, RN as Case Manager (General Practice)  Indicate any recent [place] you may have received from other than [facility] providers in the past year (date may be approximate).     Assessment:   This is a routine wellness examination for [place].  Hearing/Vision screen  Hearing Screening   '125Hz'  '250Hz'  '500Hz'  '1000Hz'  '2000Hz'  '3000Hz'  '4000Hz'  '6000Hz'  '8000Hz'   Right ear:           Left ear:           Vision Screening Comments: Regular eye exams, Dr. Ellin Mayhew  Dietary issues and exercise activities discussed: Current Exercise Habits: The patient does not participate in regular exercise at present  Goals Addressed            This Visit's Progress   . Patient Stated       01/25/2021, live as long as he can      Depression Screen PHQ 2/9 Scores 01/25/2021 01/22/2020 09/23/2019 03/21/2019 01/21/2019  01/15/2018 01/15/2018  PHQ - 2 Score 0 0 0 0 0 0 0  PHQ- 9 Score - - 2 1 - 0 -    Fall Risk Fall Risk  01/25/2021 01/22/2020 09/23/2019 03/21/2019 01/21/2019  Falls in the past year? 0 0 0 0 0  Number falls in past yr: - 0 0 0 -  Injury with Fall? - 0 0 0 -  Risk for fall due to : Medication side effect - - - -  Follow up Falls evaluation completed;Education provided;Falls prevention discussed - - - -    FALL RISK PREVENTION PERTAINING TO THE HOME:  Any stairs in or around the home? Yes  If so, are there any without handrails? No  Home free of loose throw rugs in walkways, pet beds, electrical cords, etc? Yes  Adequate lighting in your home to reduce risk of falls? Yes   ASSISTIVE DEVICES UTILIZED TO PREVENT FALLS:  Life alert? No  Use of a cane, walker or w/c? No  Grab bars in the bathroom? Yes  Shower chair or bench in shower? No  Elevated toilet seat or a handicapped toilet? Yes   TIMED UP AND GO:  Was the test performed? No .   Cognitive Function:     6CIT Screen 01/25/2021 01/21/2019 01/12/2017  What Year? 0 points 0 points 0 points  What month? 0 points 0 points 0 points  What time? 0 points 0 points 0 points  Count back from 20 0 points 0 points 0 points  Months in reverse 0 points 0 points 0 points  Repeat phrase 8 points 0 points 4 points  Total Score 8 0 4    Immunizations Immunization History  Administered Date(s) Administered  . Fluad Quad(high Dose 65+) 06/26/2020  . Influenza,inj,Quad PF,6+ Mos 06/08/2015, 06/27/2016, 07/18/2017, 07/03/2018  . Influenza-Unspecified 07/04/2019  . Janssen (J&J) SARS-COV-2 Vaccination 12/12/2019  . MMR 01/25/1995  . Moderna Sars-Covid-2 Vaccination 07/16/2020, 01/04/2021  . Pneumococcal Conjugate-13 02/14/2020  . Pneumococcal Polysaccharide-23 05/17/2013  . Td 07/27/2018  . Zoster 06/24/2015    TDAP status: Up to date  Flu Vaccine status: Up to date  Pneumococcal  vaccine status: Up to date  Covid-19 vaccine status:  Completed vaccines  Qualifies for Shingles Vaccine? Yes   Zostavax completed Yes   Shingrix Completed?: No.    Education has been provided regarding the importance of this vaccine. Patient has been advised to call insurance company to determine out of pocket expense if they have not yet received this vaccine. Advised may also receive vaccine at local pharmacy or Health Dept. Verbalized acceptance and understanding.  Screening Tests Health Maintenance  Topic Date Due  . PNA vac Low Risk Adult (2 of 2 - PPSV23) 02/13/2021  . INFLUENZA VACCINE  04/19/2021  . COLONOSCOPY (Pts 45-39yr Insurance coverage will need to be confirmed)  06/07/2026  . TETANUS/TDAP  07/27/2028  . COVID-19 Vaccine  Completed  . Hepatitis C Screening  Completed  . HPV VACCINES  Aged Out    Health Maintenance  There are no preventive care reminders to display for this patient.  Colorectal cancer screening: Type of screening: Colonoscopy. Completed 06/07/2016. Repeat every 10 years  Lung Cancer Screening: (Low Dose CT Chest recommended if Age 66-80years, 30 pack-year currently smoking OR have quit w/in 15years.) does not qualify.   Lung Cancer Screening Referral: no  Additional Screening:  Hepatitis C Screening: does qualify; Completed 04/07/2020  Vision Screening: Recommended annual ophthalmology exams for early detection of glaucoma and other disorders of the eye. Is the patient up to date with their annual eye exam?  Yes  Who is the provider or what is the name of the office in which the patient attends annual eye exams? Dr. WEllin MayhewIf pt is not established with a provider, would they like to be referred to a provider to establish care? No .   Dental Screening: Recommended annual dental exams for proper oral hygiene  Community Resource Referral / Chronic Care Management: CRR required this visit?  No   CCM required this visit?  No      Plan:     I have personally reviewed and noted the following in  the patient's chart:   . Medical and social history . Use of alcohol, tobacco or illicit drugs  . Current medications and supplements including opioid prescriptions. Patient is not currently taking opioid prescriptions. . Functional ability and status . Nutritional status . Physical activity . Advanced directives . List of other physicians . Hospitalizations, surgeries, and ER visits in previous 12 months . Vitals . Screenings to include cognitive, depression, and falls . Referrals and appointments  In addition, I have reviewed and discussed with patient certain preventive protocols, quality metrics, and best practice recommendations. A written personalized care plan for preventive services as well as general preventive health recommendations were provided to patient.     NKellie Simmering LPN   [id number]  Nurse Notes:

## 2021-01-26 NOTE — Telephone Encounter (Signed)
Scheduled 5/19

## 2021-01-27 ENCOUNTER — Other Ambulatory Visit: Payer: Self-pay

## 2021-01-27 ENCOUNTER — Ambulatory Visit: Payer: Medicare Other | Admitting: Dermatology

## 2021-01-27 DIAGNOSIS — L821 Other seborrheic keratosis: Secondary | ICD-10-CM | POA: Diagnosis not present

## 2021-01-27 DIAGNOSIS — L578 Other skin changes due to chronic exposure to nonionizing radiation: Secondary | ICD-10-CM | POA: Diagnosis not present

## 2021-01-27 DIAGNOSIS — L820 Inflamed seborrheic keratosis: Secondary | ICD-10-CM | POA: Diagnosis not present

## 2021-01-27 NOTE — Patient Instructions (Signed)

## 2021-01-27 NOTE — Progress Notes (Signed)
   Follow-Up Visit   Subjective  Mitchell [name] is a 65 y.o. male who presents for the following: irritated seborrheic keratosis (Of the back - patient still has irritating lesions on the back and would like them treated today).  She has other areas to be evaluated.  The following portions of the chart were reviewed this encounter and updated as appropriate:   Tobacco  Allergies  Meds  Problems  Med Hx  Surg Hx  Fam Hx     Review of Systems:  No other skin or systemic complaints except as noted in HPI or Assessment and Plan.  Objective  Well appearing patient in no apparent distress; mood and affect are within normal limits.  A focused examination was performed including the trunk and extremities. Relevant physical exam findings are noted in the Assessment and Plan.  Objective  Back x 26 (26): Erythematous keratotic or waxy stuck-on papule or plaque.   Assessment & Plan  Inflamed seborrheic keratosis (26) Back x 26  Destruction of lesion - Back x 26 Complexity: simple   Destruction method: cryotherapy   Informed consent: discussed and consent obtained   Timeout:  patient name, date of birth, surgical site, and procedure verified Lesion destroyed using liquid nitrogen: Yes   Region frozen until ice ball extended beyond lesion: Yes   Outcome: patient tolerated procedure well with no complications   Post-procedure details: wound care instructions given     Actinic Damage - chronic, secondary to cumulative UV radiation exposure/sun exposure over time - diffuse scaly erythematous macules with underlying dyspigmentation - Recommend daily broad spectrum sunscreen SPF 30+ to sun-exposed areas, reapply every 2 hours as needed.  - Recommend staying in the shade or wearing long sleeves, sun glasses (UVA+UVB protection) and wide brim hats (4-inch brim around the entire circumference of the hat). - Call for new or changing lesions.  Seborrheic Keratoses - Stuck-on, waxy, tan-brown  papules and/or plaques  - Benign-appearing - Discussed benign etiology and prognosis. - Observe - Call for any changes  Return in about 3 months (around 04/29/2021) for ISK follow up on the back .  Luther Redo, CMA, am acting as scribe for Sarina Ser, MD .  Documentation: I have reviewed the above documentation for accuracy and completeness, and I agree with the above.  Sarina Ser, MD

## 2021-01-29 ENCOUNTER — Other Ambulatory Visit: Payer: Self-pay | Admitting: Family Medicine

## 2021-02-02 ENCOUNTER — Encounter: Payer: Self-pay | Admitting: Dermatology

## 2021-02-04 ENCOUNTER — Ambulatory Visit: Payer: Medicare Other | Admitting: Family Medicine

## 2021-02-05 ENCOUNTER — Ambulatory Visit (INDEPENDENT_AMBULATORY_CARE_PROVIDER_SITE_OTHER): Payer: Medicare Other | Admitting: Family Medicine

## 2021-02-05 ENCOUNTER — Other Ambulatory Visit: Payer: Self-pay

## 2021-02-05 ENCOUNTER — Encounter: Payer: Self-pay | Admitting: Family Medicine

## 2021-02-05 VITALS — BP 126/71 | HR 98 | Temp 98.6°F | Ht 62.0 in | Wt 182.4 lb

## 2021-02-05 DIAGNOSIS — E038 Other specified hypothyroidism: Secondary | ICD-10-CM

## 2021-02-05 DIAGNOSIS — N138 Other obstructive and reflux uropathy: Secondary | ICD-10-CM | POA: Diagnosis not present

## 2021-02-05 DIAGNOSIS — R7301 Impaired fasting glucose: Secondary | ICD-10-CM | POA: Diagnosis not present

## 2021-02-05 DIAGNOSIS — I10 Essential (primary) hypertension: Secondary | ICD-10-CM

## 2021-02-05 DIAGNOSIS — E782 Mixed hyperlipidemia: Secondary | ICD-10-CM | POA: Diagnosis not present

## 2021-02-05 DIAGNOSIS — N401 Enlarged prostate with lower urinary tract symptoms: Secondary | ICD-10-CM

## 2021-02-05 DIAGNOSIS — F209 Schizophrenia, unspecified: Secondary | ICD-10-CM

## 2021-02-05 LAB — URINALYSIS, ROUTINE W REFLEX MICROSCOPIC
Bilirubin, UA: NEGATIVE
Glucose, UA: NEGATIVE
Ketones, UA: NEGATIVE
Leukocytes,UA: NEGATIVE
Nitrite, UA: NEGATIVE
Protein,UA: NEGATIVE
RBC, UA: NEGATIVE
Specific Gravity, UA: 1.02 (ref 1.005–1.030)
Urobilinogen, Ur: 0.2 mg/dL (ref 0.2–1.0)
pH, UA: 6.5 (ref 5.0–7.5)

## 2021-02-05 LAB — BAYER DCA HB A1C WAIVED: HB A1C (BAYER DCA - WAIVED): 6.1 % (ref <7.0)

## 2021-02-05 MED ORDER — LISINOPRIL 5 MG PO TABS: ORAL_TABLET | ORAL | 1 refills | Status: DC

## 2021-02-05 MED ORDER — FLOVENT HFA 220 MCG/ACT IN AERO: 2.0000 | INHALATION_SPRAY | Freq: Every day | RESPIRATORY_TRACT | 3 refills | Status: DC

## 2021-02-05 MED ORDER — HYDROXYZINE HCL 25 MG PO TABS: 25.0000 mg | ORAL_TABLET | Freq: Three times a day (TID) | ORAL | 1 refills | Status: DC | PRN

## 2021-02-05 MED ORDER — TADALAFIL 20 MG PO TABS: ORAL_TABLET | ORAL | 12 refills | Status: DC

## 2021-02-05 MED ORDER — ROSUVASTATIN CALCIUM 10 MG PO TABS: 10.0000 mg | ORAL_TABLET | Freq: Every day | ORAL | 1 refills | Status: DC

## 2021-02-05 NOTE — Assessment & Plan Note (Signed)
Doing well with A1c of 6.1- continue diet and exercise. Recheck 6 months.

## 2021-02-05 NOTE — Assessment & Plan Note (Signed)
Rechecking labs today. Await results. Treat as needed.  °

## 2021-02-05 NOTE — Progress Notes (Signed)
BP 126/71   Pulse 98   Temp 98.6 F (37 C) (Oral)   Ht 5\' 2"  (1.575 m)   Wt 182 lb 6.4 oz (82.7 kg)   SpO2 98%   BMI 33.36 kg/m    Subjective:    Patient ID: Mitchell [name], male    DOB: 1955-11-11, 65 y.o.   MRN: [id number]  HPI: Mitchell [name] is a 65 y.o. male  Chief Complaint  Patient presents with  . Hyperlipidemia  . Hypertension  . IFG   HYPERTENSION / HYPERLIPIDEMIA Satisfied with current treatment? yes Duration of hypertension: chronic BP monitoring frequency: not checking BP medication side effects: no Past BP meds: lisinopril Duration of hyperlipidemia: chronic Cholesterol medication side effects: no Cholesterol supplements: none Past cholesterol medications: crestor Medication compliance: excellent compliance Aspirin: no Recent stressors: no Recurrent headaches: no Visual changes: no Palpitations: no Dyspnea: no Chest pain: no Lower extremity edema: no Dizzy/lightheaded: no  Impaired Fasting Glucose HbA1C:  Lab Results  Component Value Date   HGBA1C 6.2 04/07/2020   Duration of elevated blood sugar: chronic Polydipsia: no Polyuria: no Weight change: no Visual disturbance: no Glucose Monitoring: no Diabetic Education: Not Completed Family history of diabetes: no  HYPOTHYROIDISM Thyroid control status:controlled Satisfied with current treatment? yes Medication side effects: no Medication compliance: excellent compliance Recent dose adjustment:no Fatigue: no Cold intolerance: no Heat intolerance: no Weight gain: no Weight loss: no Constipation: no Diarrhea/loose stools: no Palpitations: no Lower extremity edema: no Anxiety/depressed mood: no   Relevant past medical, surgical, family and social history reviewed and updated as indicated. Interim medical history since our last visit reviewed. Allergies and medications reviewed and updated.  Review of Systems  Constitutional: Negative.   Respiratory: Negative.   Cardiovascular:  Negative.   Gastrointestinal: Negative.   Musculoskeletal: Negative.   Neurological: Negative.   Psychiatric/Behavioral: Negative.     Per HPI unless specifically indicated above     Objective:    BP 126/71   Pulse 98   Temp 98.6 F (37 C) (Oral)   Ht 5\' 2"  (1.575 m)   Wt 182 lb 6.4 oz (82.7 kg)   SpO2 98%   BMI 33.36 kg/m   Wt Readings from Last 3 Encounters:  02/05/21 182 lb 6.4 oz (82.7 kg)  01/25/21 165 lb (74.8 kg)  06/21/20 176 lb 12.9 oz (80.2 kg)    Physical Exam Vitals and nursing note reviewed.  Constitutional:      General: He is not in acute distress.    Appearance: Normal appearance. He is not ill-appearing, toxic-appearing or diaphoretic.  HENT:     Head: Normocephalic and atraumatic.     Right Ear: External ear normal.     Left Ear: External ear normal.     Nose: Nose normal.     Mouth/Throat:     Mouth: Mucous membranes are moist.     Pharynx: Oropharynx is clear.  Eyes:     General: No scleral icterus.       Right eye: No discharge.        Left eye: No discharge.     Extraocular Movements: Extraocular movements intact.     Conjunctiva/sclera: Conjunctivae normal.     Pupils: Pupils are equal, round, and reactive to light.  Cardiovascular:     Rate and Rhythm: Normal rate and regular rhythm.     Pulses: Normal pulses.     Heart sounds: Normal heart sounds. No murmur heard. No friction rub. No gallop.  Pulmonary:     Effort: Pulmonary effort is normal. No respiratory distress.     Breath sounds: Normal breath sounds. No stridor. No wheezing, rhonchi or rales.  Chest:     Chest wall: No tenderness.  Musculoskeletal:        General: Normal range of motion.     Cervical back: Normal range of motion and neck supple.  Skin:    General: Skin is warm and dry.     Capillary Refill: Capillary refill takes less than 2 seconds.     Coloration: Skin is not jaundiced or pale.     Findings: No bruising, erythema, lesion or rash.  Neurological:      General: No focal deficit present.     Mental Status: He is alert and oriented to person, place, and time. Mental status is at baseline.  Psychiatric:        Mood and Affect: Mood normal.        Behavior: Behavior normal.        Thought Content: Thought content normal.        Judgment: Judgment normal.     Results for orders placed or performed in visit on 04/07/20  Bayer DCA Hb A1c Waived  Result Value Ref Range   HB A1C (BAYER DCA - WAIVED) 6.2 <7.0 %  CBC with Differential/Platelet  Result Value Ref Range   WBC 8.2 3.4 - 10.8 x10E3/uL   RBC 4.74 4.14 - 5.80 x10E6/uL   Hemoglobin 15.0 13.0 - 17.7 g/dL   Hematocrit 43.6 37.5 - 51.0 %   MCV 92 79 - 97 fL   MCH 31.6 26.6 - 33.0 pg   MCHC 34.4 31.5 - 35.7 g/dL   RDW 13.3 11.6 - 15.4 %   Platelets 286 150 - 450 x10E3/uL   Neutrophils 56 Not Estab. %   Lymphs 33 Not Estab. %   Monocytes 8 Not Estab. %   Eos 3 Not Estab. %   Basos 0 Not Estab. %   Neutrophils Absolute 4.5 1.4 - 7.0 x10E3/uL   Lymphocytes Absolute 2.7 0.7 - 3.1 x10E3/uL   Monocytes Absolute 0.7 0.1 - 0.9 x10E3/uL   EOS (ABSOLUTE) 0.3 0.0 - 0.4 x10E3/uL   Basophils Absolute 0.0 0.0 - 0.2 x10E3/uL   Immature Granulocytes 0 Not Estab. %   Immature Grans (Abs) 0.0 0.0 - 0.1 x10E3/uL  Comprehensive metabolic panel  Result Value Ref Range   Glucose 128 (H) 65 - 99 mg/dL   BUN 20 8 - 27 mg/dL   Creatinine, Ser 1.15 0.76 - 1.27 mg/dL   GFR calc non Af Amer 66 >59 mL/min/1.73   GFR calc Af Amer 77 >59 mL/min/1.73   BUN/Creatinine Ratio 17 10 - 24   Sodium 143 134 - 144 mmol/L   Potassium 4.8 3.5 - 5.2 mmol/L   Chloride 108 (H) 96 - 106 mmol/L   CO2 21 20 - 29 mmol/L   Calcium 9.3 8.6 - 10.2 mg/dL   Total Protein 7.0 6.0 - 8.5 g/dL   Albumin 4.1 3.8 - 4.8 g/dL   Globulin, Total 2.9 1.5 - 4.5 g/dL   Albumin/Globulin Ratio 1.4 1.2 - 2.2   Bilirubin Total 0.3 0.0 - 1.2 mg/dL   Alkaline Phosphatase 44 (L) 48 - 121 IU/L   AST 21 0 - 40 IU/L   ALT 17 0 - 44 IU/L   HCV RNA quant  Result Value Ref Range   Hepatitis C Quantitation HCV Not Detected IU/mL   Test  Information Comment   Lipid Panel w/o Chol/HDL Ratio  Result Value Ref Range   Cholesterol, Total 177 100 - 199 mg/dL   Triglycerides 405 (H) 0 - 149 mg/dL   HDL 40 >39 mg/dL   VLDL Cholesterol Cal 64 (H) 5 - 40 mg/dL   LDL Chol Calc (NIH) 73 0 - 99 mg/dL  Microalbumin, Urine Waived  Result Value Ref Range   Microalb, Ur Waived 10 0 - 19 mg/L   Creatinine, Urine Waived 200 10 - 300 mg/dL   Microalb/Creat Ratio <30 <30 mg/g  PSA  Result Value Ref Range   Prostate Specific Ag, Serum 0.6 0.0 - 4.0 ng/mL  TSH  Result Value Ref Range   TSH 3.540 0.450 - 4.500 uIU/mL  Urinalysis, Routine w reflex microscopic  Result Value Ref Range   Specific Gravity, UA 1.025 1.005 - 1.030   pH, UA 6.0 5.0 - 7.5   Color, UA Yellow Yellow   Appearance Ur Clear Clear   Leukocytes,UA Negative Negative   Protein,UA Negative Negative/Trace   Glucose, UA Negative Negative   Ketones, UA Negative Negative   RBC, UA Negative Negative   Bilirubin, UA Negative Negative   Urobilinogen, Ur 0.2 0.2 - 1.0 mg/dL   Nitrite, UA Negative Negative      Assessment & Plan:   Problem List Items Addressed This Visit      Cardiovascular and Mediastinum   Hypertension - Primary    Under good control on current regimen. Continue current regimen. Continue to monitor. Call with any concerns. Refills given. Labs drawn today.       Relevant Medications   tadalafil (CIALIS) 20 MG tablet   rosuvastatin (CRESTOR) 10 MG tablet   lisinopril (ZESTRIL) 5 MG tablet   Other Relevant Orders   CBC with Differential/Platelet   Comprehensive metabolic panel     Endocrine   IFG (impaired fasting glucose)    Doing well with A1c of 6.1- continue diet and exercise. Recheck 6 months.       Relevant Orders   Bayer DCA Hb A1c Waived   CBC with Differential/Platelet   Comprehensive metabolic panel   Urinalysis, Routine w reflex  microscopic   Hypothyroidism    Rechecking labs today. Await results. Treat as needed.       Relevant Orders   CBC with Differential/Platelet   Comprehensive metabolic panel     Genitourinary   BPH with obstruction/lower urinary tract symptoms    Under good control on current regimen. Continue current regimen. Continue to monitor. Call with any concerns. Refills given. Labs drawn today.       Relevant Orders   CBC with Differential/Platelet   Comprehensive metabolic panel   Urinalysis, Routine w reflex microscopic   PSA     Other   Schizophrenia (HCC)    Stable. Off medicine. Continue to monitor. Call with any concerns.       Hyperlipidemia    Under good control on current regimen. Continue current regimen. Continue to monitor. Call with any concerns. Refills given. Labs drawn today.       Relevant Medications   tadalafil (CIALIS) 20 MG tablet   rosuvastatin (CRESTOR) 10 MG tablet   lisinopril (ZESTRIL) 5 MG tablet   Other Relevant Orders   CBC with Differential/Platelet   Comprehensive metabolic panel   Lipid Panel w/o Chol/HDL Ratio    Other Visit Diagnoses    Essential hypertension       Relevant Medications   tadalafil (CIALIS) 20  MG tablet   rosuvastatin (CRESTOR) 10 MG tablet   lisinopril (ZESTRIL) 5 MG tablet       Follow up plan: Return in about 6 months (around 08/08/2021) for physical.

## 2021-02-05 NOTE — Assessment & Plan Note (Signed)
Stable. Off medicine. Continue to monitor. Call with any concerns.

## 2021-02-05 NOTE — Assessment & Plan Note (Signed)
Under good control on current regimen. Continue current regimen. Continue to monitor. Call with any concerns. Refills given. Labs drawn today.   

## 2021-02-06 LAB — CBC WITH DIFFERENTIAL/PLATELET
Basophils Absolute: 0 10*3/uL (ref 0.0–0.2)
Basos: 1 %
EOS (ABSOLUTE): 0.3 10*3/uL (ref 0.0–0.4)
Eos: 3 %
Hematocrit: 43.2 % (ref 37.5–51.0)
Hemoglobin: 15.2 g/dL (ref 13.0–17.7)
Immature Grans (Abs): 0 10*3/uL (ref 0.0–0.1)
Immature Granulocytes: 0 %
Lymphocytes Absolute: 3 10*3/uL (ref 0.7–3.1)
Lymphs: 37 %
MCH: 32.3 pg (ref 26.6–33.0)
MCHC: 35.2 g/dL (ref 31.5–35.7)
MCV: 92 fL (ref 79–97)
Monocytes Absolute: 0.7 10*3/uL (ref 0.1–0.9)
Monocytes: 9 %
Neutrophils Absolute: 4 10*3/uL (ref 1.4–7.0)
Neutrophils: 50 %
Platelets: 299 10*3/uL (ref 150–450)
RBC: 4.71 x10E6/uL (ref 4.14–5.80)
RDW: 13.5 % (ref 11.6–15.4)
WBC: 8 10*3/uL (ref 3.4–10.8)

## 2021-02-06 LAB — COMPREHENSIVE METABOLIC PANEL
ALT: 29 IU/L (ref 0–44)
AST: 30 IU/L (ref 0–40)
Albumin/Globulin Ratio: 1.4 (ref 1.2–2.2)
Albumin: 4.2 g/dL (ref 3.8–4.8)
Alkaline Phosphatase: 50 IU/L (ref 44–121)
BUN/Creatinine Ratio: 18 (ref 10–24)
BUN: 23 mg/dL (ref 8–27)
Bilirubin Total: 0.4 mg/dL (ref 0.0–1.2)
CO2: 19 mmol/L — ABNORMAL LOW (ref 20–29)
Calcium: 9.4 mg/dL (ref 8.6–10.2)
Chloride: 105 mmol/L (ref 96–106)
Creatinine, Ser: 1.26 mg/dL (ref 0.76–1.27)
Globulin, Total: 3.1 g/dL (ref 1.5–4.5)
Glucose: 117 mg/dL — ABNORMAL HIGH (ref 65–99)
Potassium: 5 mmol/L (ref 3.5–5.2)
Sodium: 140 mmol/L (ref 134–144)
Total Protein: 7.3 g/dL (ref 6.0–8.5)
eGFR: 63 mL/min/{1.73_m2} (ref >59)

## 2021-02-06 LAB — LIPID PANEL W/O CHOL/HDL RATIO
Cholesterol, Total: 157 mg/dL (ref 100–199)
HDL: 36 mg/dL — ABNORMAL LOW (ref >39)
LDL Chol Calc (NIH): 65 mg/dL (ref 0–99)
Triglycerides: 356 mg/dL — ABNORMAL HIGH (ref 0–149)
VLDL Cholesterol Cal: 56 mg/dL — ABNORMAL HIGH (ref 5–40)

## 2021-02-06 LAB — PSA: Prostate Specific Ag, Serum: 0.8 ng/mL (ref 0.0–4.0)

## 2021-02-08 ENCOUNTER — Encounter: Payer: Self-pay | Admitting: Family Medicine

## 2021-03-12 ENCOUNTER — Ambulatory Visit (INDEPENDENT_AMBULATORY_CARE_PROVIDER_SITE_OTHER): Payer: Medicare Other | Admitting: General Practice

## 2021-03-12 ENCOUNTER — Telehealth: Payer: Self-pay | Admitting: General Practice

## 2021-03-12 DIAGNOSIS — I10 Essential (primary) hypertension: Secondary | ICD-10-CM

## 2021-03-12 DIAGNOSIS — E782 Mixed hyperlipidemia: Secondary | ICD-10-CM | POA: Diagnosis not present

## 2021-03-12 DIAGNOSIS — F419 Anxiety disorder, unspecified: Secondary | ICD-10-CM

## 2021-03-12 DIAGNOSIS — F331 Major depressive disorder, recurrent, moderate: Secondary | ICD-10-CM

## 2021-03-12 NOTE — Telephone Encounter (Signed)
  Chronic Care Management   Note  03/12/2021 Name: Mitchell [name] MRN: [id number] DOB: 22-Apr-1956  The RNCM was able to connect with the patient on an afternoon call. See new encounter for completed notes.   Follow up plan: Telephone follow up appointment with care management team member scheduled for:05-14-2021 at 345 pm  [place], MSN, [facility] Mobile: [phone]

## 2021-03-12 NOTE — Chronic Care Management (AMB) (Signed)
Chronic Care Management   CCM RN Visit Note  03/12/2021 Name: Mitchell [name] MRN: [id number] DOB: 1956/02/12  Subjective: Mitchell [name] is a 65 y.o. year old male who is a primary care patient of Valerie Roys, DO. The care management team was consulted for assistance with disease management and care coordination needs.    Engaged with patient by telephone for follow up visit in response to provider referral for case management and/or care coordination services.   Consent to Services:  The patient was given information about Chronic Care Management services, agreed to services, and gave verbal consent prior to initiation of services.  Please see initial visit note for detailed documentation.   Patient agreed to services and verbal consent obtained.   Assessment: Review of patient past medical history, allergies, medications, health status, including review of consultants reports, laboratory and other test data, was performed as part of comprehensive evaluation and provision of chronic care management services.   SDOH (Social Determinants of Health) assessments and interventions performed:  SDOH Interventions    Flowsheet Row Most Recent Value  SDOH Interventions   Social Connections Interventions Other (Comment)  [good support system in place, denies any issues]        CCM Care Plan  Allergies  Allergen Reactions   Haloperidol Decanoate Other (See Comments)    Muscle pain   Atorvastatin Other (See Comments)    myalgias   Codeine Nausea And Vomiting    Outpatient Encounter Medications as of 03/12/2021  Medication Sig   aspirin EC 81 MG tablet Take 1 tablet (81 mg total) by mouth daily. (Patient not taking: Reported on 02/05/2021)   cyclobenzaprine (FLEXERIL) 5 MG tablet Take 1 tablet (5 mg total) by mouth at bedtime as needed for muscle spasms.   FLOVENT HFA 220 MCG/ACT inhaler Inhale 2 puffs into the lungs daily.   hydrOXYzine (ATARAX/VISTARIL) 25 MG tablet Take 1 tablet  (25 mg total) by mouth 3 (three) times daily as needed for anxiety.   levothyroxine (SYNTHROID) 50 MCG tablet TAKE 1 TABLET BY MOUTH  DAILY BEFORE BREAKFAST   lisinopril (ZESTRIL) 5 MG tablet TAKE 1 AND 1/2 TABLETS BY  MOUTH DAILY MUST ATTEND  UPCOMING APPT IN APRIL FOR  FURTHER REFILLS.   loratadine (CLARITIN) 10 MG tablet Take 10 mg by mouth daily.   Multiple Vitamin (MULTI-VITAMINS) TABS Take by mouth.   naproxen sodium (ALEVE) 220 MG tablet Take 220 mg by mouth daily as needed (patient states he takes about 2 times a week).   rosuvastatin (CRESTOR) 10 MG tablet Take 1 tablet (10 mg total) by mouth daily.   tadalafil (CIALIS) 20 MG tablet TAKE 0.5 TO 1 TABLET BY MOUTH EVERY OTHER DAY AS NEEDED FOR ERECTILE DYSFUNCTION   No facility-administered encounter medications on file as of 03/12/2021.    Patient Active Problem List   Diagnosis Date Noted   Special screening for malignant neoplasms, colon    Problems with swallowing and mastication    Gastritis    Acromioclavicular joint arthritis 04/13/2016   Swollen testicle 08/14/2015   BPH with obstruction/lower urinary tract symptoms 08/14/2015   Hyperlipidemia 07/22/2015   Hypothyroidism 07/17/2015   Scrotal swelling 07/16/2015   Hydrocele, right 07/08/2015   Drug-induced erectile dysfunction 07/08/2015   IFG (impaired fasting glucose)    Schizophrenia (HCC)    Insomnia    Anxiety    Hypertension    Depression    Hepatitis C    Developmental delay 01/02/2013  Conditions to be addressed/monitored:HTN, HLD, Anxiety, and Depression  Care Plan : RNCM: Depression (Adult)  Updates made by Vanita Ingles since 03/12/2021 12:00 AM     Problem: RNCM: Depression Identification (Depression)   Priority: Medium     Long-Range Goal: RNCM: Depressive Symptoms Identified/anxiety   Start Date: 10/17/2020  Expected End Date: 11/07/2021  This Visit's Progress: On track  Priority: Medium  Note:   Current Barriers:  Ineffective Self Health  Maintenance  Unable to independently manage depression and anxiety  Lacks social connections Does not contact provider office for questions/concerns Clinical Goal(s):  Collaboration with Valerie Roys, DO regarding development and update of comprehensive plan of care as evidenced by provider attestation and co-signature Inter-disciplinary care team collaboration (see longitudinal plan of care) patient will work with care management team to address care coordination and chronic disease management needs related to Disease Management Educational Needs Care Coordination Psychosocial Support   Interventions:  Evaluation of current treatment plan related to Anxiety and Depression, Limited social support, Mental Health Concerns , and Lacks knowledge of community resource: for services available in the county to help meet health and wellness goals  self-management and patient's adherence to plan as established by provider. Collaboration with Valerie Roys, DO regarding development and update of comprehensive plan of care as evidenced by provider attestation       and co-signature Inter-disciplinary care team collaboration (see longitudinal plan of care) Discussed plans with patient for ongoing care management follow up and provided patient with direct contact information for care management team Evaluation of depression and anxiety.  The patient states he is doing well at this time. Appreciates the [facility] checking in on him from time to time. He has been going fishing a lot and [place] being [place]. He usually goes early in the am. He says the fish don't bite when it gets too hot. The patient confirms compliance with his medications regimen. States he is eating well and sleeping well also.  Self Care Activities:  Patient verbalizes understanding of plan to effective management of depression and anxiety  Self administers medications as prescribed Attends all scheduled provider appointments Calls  pharmacy for medication refills Attends church or other social activities Performs ADL's independently Performs IADL's independently Calls provider office for new concerns or questions Patient Goals: Call the office for changes in conditions, questions or concerns Work with CCM team to manage health and well being Share feelings with the CCM team Reach out for help when feeling overwhelmed, depressed, or anxious  Follow Up Plan: Telephone follow up appointment with care management team member scheduled for: 05-14-2021 at 345 pm    Task: RNCM: Identify Depressive Symptoms and Facilitate Treatment Completed 03/12/2021  Note:   Care Management Activities:    - anxiety screen reviewed - depression screen reviewed - medication list reviewed - mental health treatment arranged    Notes:     Care Plan : RNCM: Hypertension (Adult)  Updates made by Vanita Ingles since 03/12/2021 12:00 AM     Problem: RNCM: Hypertension (Hypertension)   Priority: Medium     Long-Range Goal: RNCM: Hypertension Monitored   Start Date: 10/17/2020  Expected End Date: 11/07/2021  This Visit's Progress: On track  Priority: Medium  Note:   Objective:  Last practice recorded BP readings:  BP Readings from Last 3 Encounters:  02/05/21 126/71  10/27/20 128/87  06/21/20 (!) 144/80   Most recent eGFR/CrCl:  Lab Results  Component Value Date  EGFR 63 02/05/2021    No components found for: CRCL Current Barriers:  Knowledge Deficits related to basic understanding of hypertension pathophysiology and self care management Knowledge Deficits related to understanding of medications prescribed for management of hypertension Cognitive Deficits Unable to independently HTN Lacks social connections Does not contact provider office for questions/concerns Case Manager Clinical Goal(s):  patient will verbalize understanding of plan for hypertension management patient will attend all scheduled medical appointments:  08-09-2021 patient will demonstrate improved adherence to prescribed treatment plan for hypertension as evidenced by taking all medications as prescribed, monitoring and recording blood pressure as directed, adhering to low sodium/DASH diet patient will demonstrate improved health management independence as evidenced by checking blood pressure as directed and notifying PCP if SBP>160 or DBP > 90, taking all medications as prescribe, and adhering to a low sodium diet as discussed. patient will verbalize basic understanding of hypertension disease process and self health management plan as evidenced by compliance with heart healthy diet, compliance with medications and working with the CCM team to manage health and well being.  Interventions:  Collaboration with Valerie Roys, DO regarding development and update of comprehensive plan of care as evidenced by provider attestation and co-signature Inter-disciplinary care team collaboration (see longitudinal plan of care) Evaluation of current treatment plan related to hypertension self management and patient's adherence to plan as established by provider. Provided education to patient re: stroke prevention, s/s of heart attack and stroke, DASH diet, complications of uncontrolled blood pressure Reviewed medications with patient and discussed importance of compliance Discussed plans with patient for ongoing care management follow up and provided patient with direct contact information for care management team Advised patient, providing education and rationale, to monitor blood pressure daily and record, calling PCP for findings outside established parameters.  Reviewed scheduled/upcoming provider appointments including: 08-09-2021 at 3:40 Self-Care Activities: - Self administers medications as prescribed Attends all scheduled provider appointments Calls provider office for new concerns, questions, or BP outside discussed parameters Checks BP and records  as discussed Follows a low sodium diet/DASH diet Patient Goals: - check blood pressure weekly - choose a place to take my blood pressure (home, clinic or office, retail store) - write blood pressure results in a log or diary - agree on reward when goals are met - agree to work together to make changes - ask questions to understand - have a family meeting to talk about healthy habits - learn about high blood pressure  Follow Up Plan: Telephone follow up appointment with care management team member scheduled for: 05-14-2021 at 345 pm    Care Plan : RNCM: HLD  Updates made by Vanita Ingles since 03/12/2021 12:00 AM     Problem: RNCM: HLD   Priority: Medium     Long-Range Goal: RNCM: HLD management   Start Date: 10/17/2020  Expected End Date: 11/07/2021  This Visit's Progress: On track  Priority: Medium  Note:   Current Barriers:  Poorly controlled hyperlipidemia, complicated by HTN, non-adherence to dietary restrictions at times Current antihyperlipidemic regimen: Crestor 10 mg QD Most recent lipid panel:     Component Value Date/Time   CHOL 157 02/05/2021 1539   CHOL 155 03/31/2016 1502   TRIG 356 (H) 02/05/2021 1539   TRIG 338 (H) 03/31/2016 1502   HDL 36 (L) 02/05/2021 1539   VLDL 68 (H) 03/31/2016 1502   [place] 65 02/05/2021 1539   ASCVD risk enhancing conditions: age >62, pre-DM, HTN, former smoker Unable to independently manage HLD  Lacks social connections Does not contact provider office for questions/concerns RN Care Manager Clinical Goal(s):  patient will work with Consulting civil engineer, providers, and care team towards execution of optimized self-health management plan patient will verbalize understanding of plan for effective management of HLD  patient will work with [place], and pcp  to address needs related to effective management of HLD patient will take all medications exactly as prescribed and will call provider for medication related questions patient will  demonstrate improved health management independence the patient will demonstrate ongoing self health care management ability Interventions: Collaboration with Valerie Roys, DO regarding development and update of comprehensive plan of care as evidenced by provider attestation and co-signature Inter-disciplinary care team collaboration (see longitudinal plan of care) Medication review performed; medication list updated in electronic medical record.  Inter-disciplinary care team collaboration (see longitudinal plan of care) Referred to pharmacy team for assistance with HLD medication management Evaluation of current treatment plan related to HLD  and patient's adherence to plan as established by provider. Advised patient to call the office for changes in conditions, questions or concerns Provided education to patient re: heart healthy diet and eating fresh fruits and vegetables Reviewed medications with patient and discussed compliance. The patient endorses compliance with medications Discussed plans with patient for ongoing care management follow up and provided patient with direct contact information for care management team Patient Goals/Self-Care Activities: - call for medicine refill 2 or 3 days before it runs out - call if I am sick and can't take my medicine - keep a list of all the medicines I take; vitamins and herbals too - learn to read medicine labels - use a pillbox to sort medicine - use an alarm clock or phone to remind me to take my medicine - change to whole grain breads, cereal, pasta - drink 6 to 8 glasses of water each day - eat 3 to 5 servings of fruits and vegetables each day - eat 5 or 6 small meals each day - eat fish at least once per week - fill half the plate with nonstarchy vegetables - limit fast food meals to no more than 1 per week - manage portion size - prepare main meal at home 3 to 5 days each week - read food labels for fat, fiber, carbohydrates and  portion size - reduce red meat to 2 to 3 times a week - be open to making changes - I can manage, know and watch for signs of a heart attack - if I have chest pain, call for help - learn about small changes that will make a big difference - learn my personal risk factors  Follow Up Plan: Telephone follow up appointment with care management team member scheduled for: 05-14-2021 at 345 pm      Task: RNCM: HLD Completed 03/12/2021  Note:   Care Management Activities:    - education plan reviewed and/or amended - empathy and reassurance conveyed - family/caregiver participation in learning encouraged - health literacy screen reviewed - patient's preferred learning methods utilized - privacy ensured - questions encouraged - readiness to learn monitored         Plan:Telephone follow up appointment with care management team member scheduled for:  05-14-2021 at 345 pm  [place], MSN, [facility] Mobile: [phone]

## 2021-03-12 NOTE — Patient Instructions (Signed)
Visit Information  PATIENT GOALS:  Goals Addressed             This Visit's Progress    RNCM: Track and Manage My Symptoms-Depression/Anxiety       Timeframe:  Long-Range Goal Priority:  Medium Start Date: 03-12-2021                            Expected End Date:    03-12-2022                   Follow Up Date 05/14/2021    - avoid negative self-talk - develop a personal safety plan - develop a plan to deal with triggers like holidays, anniversaries - exercise at least 2 to 3 times per week - have a plan for how to handle bad days - spend time or talk with others at least 2 to 3 times per week - spend time or talk with others every day - watch for early signs of feeling worse    Why is this important?   Keeping track of your progress will help your treatment team find the right mix of medicine and therapy for you.  Write in your journal every day.  Day-to-day changes in depression symptoms are normal. It may be more helpful to check your progress at the end of each week instead of every day.     Notes: The patient states that he likes to fish. The patient usually goes early in the am to fish and then comes home and takes a nap.  He enjoys fishing. He feels he is doing well. Also enjoys his garden and getting fresh vegetables out of his garden.          The patient verbalized understanding of instructions, educational materials, and care plan provided today and declined offer to receive copy of patient instructions, educational materials, and care plan.   Telephone follow up appointment with care management team member scheduled for:05-14-2021 at 10 pm  [place], MSN, [facility] Mobile: [phone]

## 2021-03-29 DIAGNOSIS — H2513 Age-related nuclear cataract, bilateral: Secondary | ICD-10-CM | POA: Diagnosis not present

## 2021-03-29 DIAGNOSIS — H35033 Hypertensive retinopathy, bilateral: Secondary | ICD-10-CM | POA: Diagnosis not present

## 2021-03-29 DIAGNOSIS — H25013 Cortical age-related cataract, bilateral: Secondary | ICD-10-CM | POA: Diagnosis not present

## 2021-04-04 ENCOUNTER — Other Ambulatory Visit: Payer: Self-pay | Admitting: Family Medicine

## 2021-04-04 DIAGNOSIS — I10 Essential (primary) hypertension: Secondary | ICD-10-CM

## 2021-04-04 NOTE — Telephone Encounter (Signed)
Requested Prescriptions  Pending Prescriptions Disp Refills  . lisinopril (ZESTRIL) 5 MG tablet [Pharmacy Med Name: Lisinopril 5 MG Oral Tablet] 135 tablet 1    Sig: TAKE 1 AND 1/2 TABLETS BY  MOUTH DAILY     Cardiovascular:  ACE Inhibitors Passed - 04/04/2021  1:02 AM      Passed - Cr in normal range and within 180 days    Creatinine  Date Value Ref Range Status  09/07/2012 1.20 0.60 - 1.30 mg/dL Final   Creatinine, Ser  Date Value Ref Range Status  02/05/2021 1.26 0.76 - 1.27 mg/dL Final         Passed - K in normal range and within 180 days    Potassium  Date Value Ref Range Status  02/05/2021 5.0 3.5 - 5.2 mmol/L Final  09/07/2012 3.7 3.5 - 5.1 mmol/L Final         Passed - Patient is not pregnant      Passed - Last BP in normal range    BP Readings from Last 1 Encounters:  02/05/21 126/71         Passed - Valid encounter within last 6 months    Recent Outpatient Visits          1 month ago Primary hypertension   [place], Megan P, DO   5 months ago [place], [place], DO   12 months ago Routine general medical examination at a health care facility   [place], [place], DO   1 year ago Muscle cramp   [place], [place], DO   1 year ago Essential hypertension   [place], [place], DO      Future Appointments            In 1 month Ralene Bathe, MD [place]   In 4 months [name], Barb Merino, DO [place], [place]   In 9 months  [employer], [place]

## 2021-04-07 ENCOUNTER — Other Ambulatory Visit: Payer: Self-pay | Admitting: Family Medicine

## 2021-04-07 NOTE — Telephone Encounter (Signed)
Requested Prescriptions  Pending Prescriptions Disp Refills  . levothyroxine (SYNTHROID) 50 MCG tablet [Pharmacy Med Name: Levothyroxine Sodium 50 MCG Oral Tablet] 90 tablet 1    Sig: TAKE 1 TABLET BY MOUTH  DAILY BEFORE BREAKFAST     Endocrinology:  Hypothyroid Agents Failed - 04/07/2021 12:12 AM      Failed - TSH needs to be rechecked within 3 months after an abnormal result. Refill until TSH is due.      Failed - TSH in normal range and within 360 days    TSH  Date Value Ref Range Status  04/07/2020 3.540 0.450 - 4.500 uIU/mL Final         Passed - Valid encounter within last 12 months    Recent Outpatient Visits          2 months ago Primary hypertension   [place], Megan P, DO   5 months ago [place], [place], DO   1 year ago Routine general medical examination at a health care facility   [place], [place], DO   1 year ago Muscle cramp   [place], [place], DO   1 year ago Essential hypertension   [place], [place], DO      Future Appointments            In 1 month Ralene Bathe, MD [place]   In 4 months [name], Barb Merino, DO [place], [place]   In 9 months  [employer], [place]

## 2021-04-08 ENCOUNTER — Encounter: Payer: Medicare Other | Admitting: Family Medicine

## 2021-04-09 ENCOUNTER — Other Ambulatory Visit: Payer: Self-pay | Admitting: Family Medicine

## 2021-05-13 DIAGNOSIS — M7071 Other bursitis of hip, right hip: Secondary | ICD-10-CM | POA: Diagnosis not present

## 2021-05-14 ENCOUNTER — Telehealth: Payer: Medicare Other

## 2021-05-14 ENCOUNTER — Ambulatory Visit (INDEPENDENT_AMBULATORY_CARE_PROVIDER_SITE_OTHER): Payer: Medicare Other

## 2021-05-14 DIAGNOSIS — I10 Essential (primary) hypertension: Secondary | ICD-10-CM | POA: Diagnosis not present

## 2021-05-14 DIAGNOSIS — E782 Mixed hyperlipidemia: Secondary | ICD-10-CM | POA: Diagnosis not present

## 2021-05-14 DIAGNOSIS — F331 Major depressive disorder, recurrent, moderate: Secondary | ICD-10-CM | POA: Diagnosis not present

## 2021-05-14 DIAGNOSIS — M25559 Pain in unspecified hip: Secondary | ICD-10-CM

## 2021-05-14 NOTE — Chronic Care Management (AMB) (Signed)
Chronic Care Management   CCM RN Visit Note  05/14/2021 Name: Mitchell [name] MRN: [id number] DOB: [birth year]  Subjective: Mitchell [name] is a 65 y.o. year old male who is a primary care patient of Valerie Roys, DO. The care management team was consulted for assistance with disease management and care coordination needs.    Engaged with patient by telephone for follow up visit in response to provider referral for case management and/or care coordination services.   Consent to Services:  The patient was given information about Chronic Care Management services, agreed to services, and gave verbal consent prior to initiation of services.  Please see initial visit note for detailed documentation.   Patient agreed to services and verbal consent obtained.   Assessment: Review of patient past medical history, allergies, medications, health status, including review of consultants reports, laboratory and other test data, was performed as part of comprehensive evaluation and provision of chronic care management services.   SDOH (Social Determinants of Health) assessments and interventions performed:    CCM Care Plan  Allergies  Allergen Reactions   Haloperidol Decanoate Other (See Comments)    Muscle pain   Atorvastatin Other (See Comments)    myalgias   Codeine Nausea And Vomiting    Outpatient Encounter Medications as of 05/14/2021  Medication Sig   aspirin EC 81 MG tablet Take 1 tablet (81 mg total) by mouth daily. (Patient not taking: Reported on 02/05/2021)   cyclobenzaprine (FLEXERIL) 5 MG tablet Take 1 tablet (5 mg total) by mouth at bedtime as needed for muscle spasms.   FLOVENT HFA 220 MCG/ACT inhaler Inhale 2 puffs into the lungs daily.   hydrOXYzine (ATARAX/VISTARIL) 25 MG tablet TAKE 1 TABLET BY MOUTH 3  TIMES DAILY AS NEEDED FOR  ANXIETY   levothyroxine (SYNTHROID) 50 MCG tablet TAKE 1 TABLET BY MOUTH  DAILY BEFORE BREAKFAST   lisinopril (ZESTRIL) 5 MG tablet TAKE 1 AND 1/2  TABLETS BY  MOUTH DAILY   loratadine (CLARITIN) 10 MG tablet Take 10 mg by mouth daily.   Multiple Vitamin (MULTI-VITAMINS) TABS Take by mouth.   naproxen sodium (ALEVE) 220 MG tablet Take 220 mg by mouth daily as needed (patient states he takes about 2 times a week).   rosuvastatin (CRESTOR) 10 MG tablet Take 1 tablet (10 mg total) by mouth daily.   tadalafil (CIALIS) 20 MG tablet TAKE 0.5 TO 1 TABLET BY MOUTH EVERY OTHER DAY AS NEEDED FOR ERECTILE DYSFUNCTION   No facility-administered encounter medications on file as of 05/14/2021.    Patient Active Problem List   Diagnosis Date Noted   Special screening for malignant neoplasms, colon    Problems with swallowing and mastication    Gastritis    Acromioclavicular joint arthritis 04/13/2016   Swollen testicle 08/14/2015   BPH with obstruction/lower urinary tract symptoms 08/14/2015   Hyperlipidemia 07/22/2015   Hypothyroidism 07/17/2015   Scrotal swelling 07/16/2015   Hydrocele, right 07/08/2015   Drug-induced erectile dysfunction 07/08/2015   IFG (impaired fasting glucose)    Schizophrenia ([place])    Insomnia    Anxiety    Hypertension    Depression    Hepatitis C    Developmental delay 01/02/2013    Conditions to be addressed/monitored:HTN, HLD, Depression, and Chronic Pain   Care Plan : RNCM: Depression (Adult)  Updates made by Vanita Ingles, RN since 05/14/2021 12:00 AM     Problem: RNCM: Depression Identification (Depression)   Priority: Medium     Long-Range  Goal: RNCM: Depressive Symptoms Identified/anxiety   Start Date: 10/17/2020  Expected End Date: 11/07/2021  This Visit's Progress: On track  Recent Progress: On track  Priority: Medium  Note:   Current Barriers:  Ineffective Self Health Maintenance  Unable to independently manage depression and anxiety  Lacks social connections Does not contact provider office for questions/concerns Clinical Goal(s):  Collaboration with Valerie Roys, DO regarding  development and update of comprehensive plan of care as evidenced by provider attestation and co-signature Inter-disciplinary care team collaboration (see longitudinal plan of care) patient will work with care management team to address care coordination and chronic disease management needs related to Disease Management Educational Needs Care Coordination Psychosocial Support   Interventions:  Evaluation of current treatment plan related to Anxiety and Depression, Limited social support, Mental Health Concerns , and Lacks knowledge of community resource: for services available in the county to help meet health and wellness goals self-management and patient's adherence to plan as established by provider. 05-14-2021: The patient is doing well. States he has had a hard life but now he is doing so much better since being with his 3rd wife. She makes him happy and takes good care of him. The patient states he is blessed and is thankful for the support of the CCM team. Will continue to monitor for changes.  Collaboration with Valerie Roys, DO regarding development and update of comprehensive plan of care as evidenced by provider attestation       and co-signature Inter-disciplinary care team collaboration (see longitudinal plan of care) Discussed plans with patient for ongoing care management follow up and provided patient with direct contact information for care management team Evaluation of depression and anxiety.  The patient states he is doing well at this time. Appreciates the [facility] checking in on him from time to time. He has been going fishing a lot and [place]. He usually goes early in the am. He says the fish don't bite when it gets too hot. The patient confirms compliance with his medications regimen. States he is eating well and sleeping well also. 05-14-2021: Denies any issues at this time. Will continue to monitor for changes.  Self Care Activities:  Patient verbalizes  understanding of plan to effective management of depression and anxiety  Self administers medications as prescribed Attends all scheduled provider appointments Calls pharmacy for medication refills Attends church or other social activities Performs ADL's independently Performs IADL's independently Calls provider office for new concerns or questions Patient Goals: Call the office for changes in conditions, questions or concerns Work with CCM team to manage health and well being Share feelings with the CCM team Reach out for help when feeling overwhelmed, depressed, or anxious  Follow Up Plan: Telephone follow up appointment with care management team member scheduled for: 05-14-2021 at 345 pm    Care Plan : RNCM: Hypertension (Adult)  Updates made by Vanita Ingles, RN since 05/14/2021 12:00 AM     Problem: RNCM: Hypertension (Hypertension)   Priority: Medium     Long-Range Goal: RNCM: Hypertension Monitored   Start Date: 10/17/2020  Expected End Date: 11/07/2021  This Visit's Progress: On track  Recent Progress: On track  Priority: Medium  Note:   Objective:  Last practice recorded BP readings:  BP Readings from Last 3 Encounters:  02/05/21 126/71  10/27/20 128/87  06/21/20 (!) 144/80   Most recent eGFR/CrCl:  Lab Results  Component Value Date   EGFR 63 02/05/2021  No components found for: CRCL Current Barriers:  Knowledge Deficits related to basic understanding of hypertension pathophysiology and self care management Knowledge Deficits related to understanding of medications prescribed for management of hypertension Cognitive Deficits Unable to independently HTN Lacks social connections Does not contact provider office for questions/concerns Case Manager Clinical Goal(s):  patient will verbalize understanding of plan for hypertension management patient will attend all scheduled medical appointments: 08-09-2021 patient will demonstrate improved adherence to  prescribed treatment plan for hypertension as evidenced by taking all medications as prescribed, monitoring and recording blood pressure as directed, adhering to low sodium/DASH diet patient will demonstrate improved health management independence as evidenced by checking blood pressure as directed and notifying PCP if SBP>160 or DBP > 90, taking all medications as prescribe, and adhering to a low sodium diet as discussed. patient will verbalize basic understanding of hypertension disease process and self health management plan as evidenced by compliance with heart healthy diet, compliance with medications and working with the CCM team to manage health and well being.  Interventions:  Collaboration with Valerie Roys, DO regarding development and update of comprehensive plan of care as evidenced by provider attestation and co-signature Inter-disciplinary care team collaboration (see longitudinal plan of care) Evaluation of current treatment plan related to hypertension self management and patient's adherence to plan as established by provider. 05-14-2021: The patient denies any issues with HTN. Is monitoring his dietary intake. States he is eating well and sleeping well. Feels like he is extremely blessed. Will continue to monitor.  Provided education to patient re: stroke prevention, s/s of heart attack and stroke, DASH diet, complications of uncontrolled blood pressure Reviewed medications with patient and discussed importance of compliance Discussed plans with patient for ongoing care management follow up and provided patient with direct contact information for care management team Advised patient, providing education and rationale, to monitor blood pressure daily and record, calling PCP for findings outside established parameters.  Reviewed scheduled/upcoming provider appointments including: 08-09-2021 at 3:40 Self-Care Activities: - Self administers medications as prescribed Attends all  scheduled provider appointments Calls provider office for new concerns, questions, or BP outside discussed parameters Checks BP and records as discussed Follows a low sodium diet/DASH diet Patient Goals: - check blood pressure weekly - choose a place to take my blood pressure (home, clinic or office, retail store) - write blood pressure results in a log or diary - agree on reward when goals are met - agree to work together to make changes - ask questions to understand - have a family meeting to talk about healthy habits - learn about high blood pressure  Follow Up Plan: Telephone follow up appointment with care management team member scheduled for: 07-16-2021 at 345 pm    Task: RNCM: Identify and Monitor Blood Pressure Elevation Completed 05/14/2021  Outcome: Positive  Note:   Care Management Activities:    - blood pressure trends reviewed - depression screen reviewed - home or ambulatory blood pressure monitoring encouraged        Care Plan : RNCM: HLD  Updates made by Vanita Ingles, RN since 05/14/2021 12:00 AM     Problem: RNCM: HLD   Priority: Medium     Long-Range Goal: RNCM: HLD management   Start Date: 10/17/2020  Expected End Date: 11/07/2021  This Visit's Progress: On track  Recent Progress: On track  Priority: Medium  Note:   Current Barriers:  Poorly controlled hyperlipidemia, complicated by HTN, non-adherence to dietary restrictions at times Current antihyperlipidemic  regimen: Crestor 10 mg QD Most recent lipid panel:     Component Value Date/Time   CHOL 157 02/05/2021 1539   CHOL 155 03/31/2016 1502   TRIG 356 (H) 02/05/2021 1539   TRIG 338 (H) 03/31/2016 1502   HDL 36 (L) 02/05/2021 1539   VLDL 68 (H) 03/31/2016 1502   LDLCALC 65 02/05/2021 1539   ASCVD risk enhancing conditions: age >44, pre-DM, HTN, former smoker Unable to independently manage HLD  Lacks social connections Does not contact provider office for questions/concerns RN Care  Manager Clinical Goal(s):  patient will work with Consulting civil engineer, providers, and care team towards execution of optimized self-health management plan patient will verbalize understanding of plan for effective management of HLD  patient will work with [place], and pcp  to address needs related to effective management of HLD patient will take all medications exactly as prescribed and will call provider for medication related questions patient will demonstrate improved health management independence the patient will demonstrate ongoing self health care management ability Interventions: Collaboration with Valerie Roys, DO regarding development and update of comprehensive plan of care as evidenced by provider attestation and co-signature Inter-disciplinary care team collaboration (see longitudinal plan of care) Medication review performed; medication list updated in electronic medical record.  Inter-disciplinary care team collaboration (see longitudinal plan of care) Referred to pharmacy team for assistance with HLD medication management Evaluation of current treatment plan related to HLD  and patient's adherence to plan as established by provider. 05-14-2021: The patient denies any changes with his cardiac health. The patient states he is doing well today. Will continue to monitor.  Advised patient to call the office for changes in conditions, questions or concerns Provided education to patient re: heart healthy diet and eating fresh fruits and vegetables Reviewed medications with patient and discussed compliance. 05-14-2021:The patient endorses compliance with medications Discussed plans with patient for ongoing care management follow up and provided patient with direct contact information for care management team Patient Goals/Self-Care Activities: - call for medicine refill 2 or 3 days before it runs out - call if I am sick and can't take my medicine - keep a list of all the medicines I take;  vitamins and herbals too - learn to read medicine labels - use a pillbox to sort medicine - use an alarm clock or phone to remind me to take my medicine - change to whole grain breads, cereal, pasta - drink 6 to 8 glasses of water each day - eat 3 to 5 servings of fruits and vegetables each day - eat 5 or 6 small meals each day - eat fish at least once per week - fill half the plate with nonstarchy vegetables - limit fast food meals to no more than 1 per week - manage portion size - prepare main meal at home 3 to 5 days each week - read food labels for fat, fiber, carbohydrates and portion size - reduce red meat to 2 to 3 times a week - be open to making changes - I can manage, know and watch for signs of a heart attack - if I have chest pain, call for help - learn about small changes that will make a big difference - learn my personal risk factors  Follow Up Plan: Telephone follow up appointment with care management team member scheduled for: 07-16-2021 at 345 pm      Care Plan : RNCM: Chronic Pain (Adult)  Updates made by Vanita Ingles, RN since  05/14/2021 12:00 AM     Problem: RNCM: Chronic Pain Management (Chronic Pain)   Priority: High  Onset Date: 05/14/2021     Long-Range Goal: RNCM: Chronic Pain Managed   Start Date: 05/14/2021  Expected End Date: 05/14/2022  This Visit's Progress: On track  Priority: High  Note:   Current Barriers:  Knowledge Deficits related to managing acute/chronic pain Non-adherence to scheduled provider appointments Non-adherence to prescribed medication regimen Difficulty obtaining medications Chronic Disease Management support and education needs related to chronic pain Unable to independently manage pain to right hip as evidence of exacerbation of bursitis to right hip Lacks social connections Does not contact provider office for questions/concerns Nurse Case Manager Clinical Goal(s):  patient will verbalize understanding of plan for  managing pain patient will work with [place] to address pain exacerbations and alternative pain relief measures, safety and fall prevention, and other needs related to pain management  patient will attend all scheduled medical appointments: 08-09-2021 patient will demonstrate use of different relaxation  skills and/or diversional activities to assist with pain reduction (distraction, imagery, relaxation, massage, acupressure, TENS, heat, and cold application patient will report pain at a level less than 3 to 4 on a 10-10 rating scale- 05-14-2021: Rates his pain level at a 4 or 5 today. States it is much better than what it was yesterday before getting his shot.  patient will use pharmacological and nonpharmacological pain relief strategies patient will verbalize acceptable level of pain relief and ability to engage in desired activities patient will engage in desired activities without an increase in pain level Interventions:  Collaboration with Valerie Roys, DO regarding development and update of comprehensive plan of care as evidenced by provider attestation and co-signature Inter-disciplinary care team collaboration (see longitudinal plan of care) - careful application of heat or ice encouraged - complementary therapy use encouraged - deep breathing, relaxation and mindfulness use promoted - effectiveness of pharmacologic therapy monitored - medication-induced side effects managed - misuse of pain medication assessed - motivation and barriers to change assessed and addressed - mutually acceptable comfort goal set - pain assessed- 05-14-2021: The patient rates his pain level at a 4 or 5 today on a scale of 0-10. States getting the shot yesterday at [facility] has been very helpful and he feels so much better today.  - pain treatment goals reviewed. 05-14-2021: The patient states he has to get a shot about every year because he has a flare up of his bursitis. He feels much better today.   - participation in physical therapy encouraged - participation in support group encouraged - premedication prior to activity encouraged - strategies to manage constipation promoted Evaluation of current treatment plan related to chronic pain and bursitis in his right hip and patient's adherence to plan as established by provider. Advised patient to call the office for changes in level or intensity of pain or discomfort Provided education to patient re: falls prevention and safety, factors that may cause exacerbation of pain, and working with the CCM team to help with pain management  Reviewed medications with patient and discussed compliance. The patient is compliant with medications.  Discussed plans with patient for ongoing care management follow up and provided patient with direct contact information for care management team Allow patient to maintain a diary of pain ratings, timing, precipitating events, medications, treatments, and what works best to relieve pain,  Refer to support groups and self-help groups Educate patient about the use of pharmacological interventions for pain  management- antianxiety, antidepressants, NSAIDS, opioid analgesics,  Explain the importance of lifestyle modifications to effective pain management  Patient Goals/Self Care Activities:   Self-administers medications as prescribed Attends all scheduled provider appointments Calls pharmacy for medication refills Calls provider office for new concerns or questions Follow Up Plan: Telephone follow up appointment with care management team member scheduled for: 07-16-2021 at 345 pm      Task: Alleviate Barriers to Chronic Pain Management Completed 05/14/2021  Outcome: Positive  Note:   Care Management Activities:    - careful application of heat or ice encouraged - deep breathing, relaxation and mindfulness use promoted - effectiveness of pharmacologic therapy monitored - enrollment in pain education program  arranged - medication-induced side effects managed - misuse of pain medication assessed - motivation and barriers to change assessed and addressed - mutually acceptable comfort goal set - pain assessed - pain treatment goals reviewed - participation in physical therapy encouraged - participation in support group encouraged - premedication prior to activity encouraged - strategies to manage constipation promoted    Notes:      Plan:Telephone follow up appointment with care management team member scheduled for:  07-16-2021 at 345 pm  [place], MSN, [facility] Mobile: [phone]

## 2021-05-14 NOTE — Patient Instructions (Signed)
Visit Information  PATIENT GOALS:  Goals Addressed             This Visit's Progress    RNCM: Cope with Chronic Pain       Timeframe:  Long-Range Goal Priority:  High Start Date:  05-14-2021                         Expected End Date:      05-14-2022                 Follow Up Date 07/16/2021    - learn how to meditate - learn relaxation techniques - practice acceptance of chronic pain - practice relaxation or meditation daily - spend time with positive people - tell myself I can (not I can't) - think of new ways to do favorite things - use distraction techniques - use relaxation during pain    Why is this important?   Stress makes chronic pain feel worse.  Feelings like depression, anxiety, stress and anger can make your body more sensitive to pain.  Learning ways to cope with stress or depression may help you find some relief from the pain.     Notes: 05-14-2021: The patient had a flare up of right hip bursitis and when to [facility] yesterday and received an injection in his hip. The patient states he feels much better today. Denies any acute distress. States  his pain level is at a 4 or 5 today. States he has to get an injection every year usually. He doesn't go unless he absolutely has to go.     RNCM: Track and Manage My Symptoms-Depression/Anxiety       Timeframe:  Long-Range Goal Priority:  Medium Start Date: 03-12-2021                            Expected End Date:    03-12-2022                   Follow Up Date 07/16/2021    - avoid negative self-talk - develop a personal safety plan - develop a plan to deal with triggers like holidays, anniversaries - exercise at least 2 to 3 times per week - have a plan for how to handle bad days - spend time or talk with others at least 2 to 3 times per week - spend time or talk with others every day - watch for early signs of feeling worse    Why is this important?   Keeping track of your progress will help your treatment  team find the right mix of medicine and therapy for you.  Write in your journal every day.  Day-to-day changes in depression symptoms are normal. It may be more helpful to check your progress at the end of each week instead of every day.     Notes: The patient states that he likes to fish. The patient usually goes early in the am to fish and then comes home and takes a nap.  He enjoys fishing. He feels he is doing well. Also enjoys his garden and getting fresh vegetables out of his garden. 05-14-2021: The patient is doing well. Had an injection in his hip for bursitis yesterday and is feeling much better. Denies any issues with depression or anxiety. Will continue to monitor. He appreciates follow up from the CCM team.        The patient  verbalized understanding of instructions, educational materials, and care plan provided today and declined offer to receive copy of patient instructions, educational materials, and care plan.   Telephone follow up appointment with care management team member scheduled for: 07-16-2021 at 35 pm  [name] RN, MSN, [facility] Mobile: [phone]

## 2021-05-18 ENCOUNTER — Ambulatory Visit: Payer: Medicare Other | Admitting: Dermatology

## 2021-05-18 ENCOUNTER — Other Ambulatory Visit: Payer: Self-pay

## 2021-05-18 DIAGNOSIS — L821 Other seborrheic keratosis: Secondary | ICD-10-CM

## 2021-05-18 DIAGNOSIS — L578 Other skin changes due to chronic exposure to nonionizing radiation: Secondary | ICD-10-CM | POA: Diagnosis not present

## 2021-05-18 DIAGNOSIS — L820 Inflamed seborrheic keratosis: Secondary | ICD-10-CM

## 2021-05-18 NOTE — Progress Notes (Signed)
   Follow-Up Visit   Subjective  Mitchell [name] is a 65 y.o. male who presents for the following: Follow-up (Inflamed SK of back x 26 treated with LN2).  The following portions of the chart were reviewed this encounter and updated as appropriate:   Tobacco  Allergies  Meds  Problems  Med Hx  Surg Hx  Fam Hx     Review of Systems:  No other skin or systemic complaints except as noted in HPI or Assessment and Plan.  Objective  Well appearing patient in no apparent distress; mood and affect are within normal limits.  All skin waist up examined.  Trunk x 25 (25) Erythematous keratotic or waxy stuck-on papule or plaque.    Assessment & Plan   Seborrheic Keratoses - Stuck-on, waxy, tan-brown papules and/or plaques  - Benign-appearing - Discussed benign etiology and prognosis. - Observe - Call for any changes  Inflamed seborrheic keratosis Trunk x 25  Destruction of lesion - Trunk x 25 Complexity: simple   Destruction method: cryotherapy   Informed consent: discussed and consent obtained   Timeout:  patient name, date of birth, surgical site, and procedure verified Lesion destroyed using liquid nitrogen: Yes   Region frozen until ice ball extended beyond lesion: Yes   Outcome: patient tolerated procedure well with no complications   Post-procedure details: wound care instructions given    Actinic Damage - chronic, secondary to cumulative UV radiation exposure/sun exposure over time - diffuse scaly erythematous macules with underlying dyspigmentation - Recommend daily broad spectrum sunscreen SPF 30+ to sun-exposed areas, reapply every 2 hours as needed.  - Recommend staying in the shade or wearing long sleeves, sun glasses (UVA+UVB protection) and wide brim hats (4-inch brim around the entire circumference of the hat). - Call for new or changing lesions.  Return in about 3 months (around 08/18/2021) for Follow up.  I, Ashok Cordia, CMA, am acting as scribe for Sarina Ser, MD . Documentation: I have reviewed the above documentation for accuracy and completeness, and I agree with the above.  Sarina Ser, MD

## 2021-05-18 NOTE — Patient Instructions (Signed)

## 2021-05-19 ENCOUNTER — Encounter: Payer: Self-pay | Admitting: Dermatology

## 2021-07-08 ENCOUNTER — Other Ambulatory Visit: Payer: Self-pay | Admitting: Family Medicine

## 2021-07-09 NOTE — Telephone Encounter (Signed)
Requested Prescriptions  Pending Prescriptions Disp Refills  . rosuvastatin (CRESTOR) 10 MG tablet [Pharmacy Med Name: Rosuvastatin Calcium 10 MG Oral Tablet] 90 tablet 1    Sig: TAKE 1 TABLET BY MOUTH  DAILY     Cardiovascular:  Antilipid - Statins Failed - 07/08/2021 10:57 PM      Failed - HDL in normal range and within 360 days    HDL  Date Value Ref Range Status  02/05/2021 36 (L) >39 mg/dL Final         Failed - Triglycerides in normal range and within 360 days    Triglycerides  Date Value Ref Range Status  02/05/2021 356 (H) 0 - 149 mg/dL Final   Triglycerides Piccolo,Waived  Date Value Ref Range Status  03/31/2016 338 (H) <150 mg/dL Final    Comment:                            Normal                   <150                         Borderline High     150 - 199                         High                200 - 499                         Very High                >499          Passed - Total Cholesterol in normal range and within 360 days    Cholesterol, Total  Date Value Ref Range Status  02/05/2021 157 100 - 199 mg/dL Final   Cholesterol Piccolo, Waived  Date Value Ref Range Status  03/31/2016 155 <200 mg/dL Final    Comment:                            Desirable                <200                         Borderline High      200- 239                         High                     >239          Passed - LDL in normal range and within 360 days    LDL Chol Calc (NIH)  Date Value Ref Range Status  02/05/2021 65 0 - 99 mg/dL Final         Passed - Patient is not pregnant      Passed - Valid encounter within last 12 months    Recent Outpatient Visits          5 months ago Primary hypertension   [place], Megan P, DO   8 months ago COVID-19   [employer], [place], DO  1 year ago Routine general medical examination at a health care facility   [place], [place], DO   1 year ago Muscle  cramp   [place], [place], DO   1 year ago Essential hypertension   [facility] Valerie Roys, DO      Future Appointments            In 1 month [name], Barb Merino, DO [place], [place]   In 1 month Ralene Bathe, MD [place]   In 6 months  [facility], [place]

## 2021-07-16 ENCOUNTER — Telehealth: Payer: Medicare Other | Admitting: General Practice

## 2021-07-16 ENCOUNTER — Ambulatory Visit (INDEPENDENT_AMBULATORY_CARE_PROVIDER_SITE_OTHER): Payer: Medicare Other

## 2021-07-16 DIAGNOSIS — I10 Essential (primary) hypertension: Secondary | ICD-10-CM

## 2021-07-16 DIAGNOSIS — F419 Anxiety disorder, unspecified: Secondary | ICD-10-CM

## 2021-07-16 DIAGNOSIS — E782 Mixed hyperlipidemia: Secondary | ICD-10-CM

## 2021-07-16 DIAGNOSIS — F331 Major depressive disorder, recurrent, moderate: Secondary | ICD-10-CM

## 2021-07-16 DIAGNOSIS — M25519 Pain in unspecified shoulder: Secondary | ICD-10-CM

## 2021-07-16 DIAGNOSIS — G8929 Other chronic pain: Secondary | ICD-10-CM

## 2021-07-16 NOTE — Chronic Care Management (AMB) (Signed)
Chronic Care Management   CCM RN Visit Note  07/16/2021 Name: Mitchell [name] MRN: [id number] DOB: [birth year]  Subjective: Mitchell [name] is a 65 y.o. year old male who is a primary care patient of Valerie Roys, DO. The care management team was consulted for assistance with disease management and care coordination needs.    Engaged with patient by telephone for follow up visit in response to provider referral for case management and/or care coordination services.   Consent to Services:  The patient was given information about Chronic Care Management services, agreed to services, and gave verbal consent prior to initiation of services.  Please see initial visit note for detailed documentation.   Patient agreed to services and verbal consent obtained.   Assessment: Review of patient past medical history, allergies, medications, health status, including review of consultants reports, laboratory and other test data, was performed as part of comprehensive evaluation and provision of chronic care management services.   SDOH (Social Determinants of Health) assessments and interventions performed:    CCM Care Plan  Allergies  Allergen Reactions   Haloperidol Decanoate Other (See Comments)    Muscle pain   Atorvastatin Other (See Comments)    myalgias   Codeine Nausea And Vomiting    Outpatient Encounter Medications as of 07/16/2021  Medication Sig   aspirin EC 81 MG tablet Take 1 tablet (81 mg total) by mouth daily. (Patient not taking: Reported on 02/05/2021)   cyclobenzaprine (FLEXERIL) 5 MG tablet Take 1 tablet (5 mg total) by mouth at bedtime as needed for muscle spasms.   FLOVENT HFA 220 MCG/ACT inhaler Inhale 2 puffs into the lungs daily.   hydrOXYzine (ATARAX/VISTARIL) 25 MG tablet TAKE 1 TABLET BY MOUTH 3  TIMES DAILY AS NEEDED FOR  ANXIETY   levothyroxine (SYNTHROID) 50 MCG tablet TAKE 1 TABLET BY MOUTH  DAILY BEFORE BREAKFAST   lisinopril (ZESTRIL) 5 MG tablet TAKE 1 AND 1/2  TABLETS BY  MOUTH DAILY   loratadine (CLARITIN) 10 MG tablet Take 10 mg by mouth daily.   Multiple Vitamin (MULTI-VITAMINS) TABS Take by mouth.   naproxen sodium (ALEVE) 220 MG tablet Take 220 mg by mouth daily as needed (patient states he takes about 2 times a week).   rosuvastatin (CRESTOR) 10 MG tablet TAKE 1 TABLET BY MOUTH  DAILY   tadalafil (CIALIS) 20 MG tablet TAKE 0.5 TO 1 TABLET BY MOUTH EVERY OTHER DAY AS NEEDED FOR ERECTILE DYSFUNCTION   No facility-administered encounter medications on file as of 07/16/2021.    Patient Active Problem List   Diagnosis Date Noted   Special screening for malignant neoplasms, colon    Problems with swallowing and mastication    Gastritis    Acromioclavicular joint arthritis 04/13/2016   Swollen testicle 08/14/2015   BPH with obstruction/lower urinary tract symptoms 08/14/2015   Hyperlipidemia 07/22/2015   Hypothyroidism 07/17/2015   Scrotal swelling 07/16/2015   Hydrocele, right 07/08/2015   Drug-induced erectile dysfunction 07/08/2015   IFG (impaired fasting glucose)    Schizophrenia ([place])    Insomnia    Anxiety    Hypertension    Depression    Hepatitis C    Developmental delay 01/02/2013    Conditions to be addressed/monitored:HTN, HLD, Anxiety, Depression, and Osteoarthritis  Care Plan : RNCM: Depression (Adult)  Updates made by Vanita Ingles, RN since 07/16/2021 12:00 AM  Completed 07/16/2021   Problem: RNCM: Depression Identification (Depression) Resolved 07/16/2021  Priority: Medium     Long-Range Goal:  RNCM: Depressive Symptoms Identified/anxiety Completed 07/16/2021  Start Date: 10/17/2020  Expected End Date: 11/07/2021  Recent Progress: On track  Priority: Medium  Note:   Current Barriers: Resolving, duplicate goal Ineffective Self Health Maintenance  Unable to independently manage depression and anxiety  Lacks social connections Does not contact provider office for questions/concerns Clinical Goal(s):   Collaboration with Valerie Roys, DO regarding development and update of comprehensive plan of care as evidenced by provider attestation and co-signature Inter-disciplinary care team collaboration (see longitudinal plan of care) patient will work with care management team to address care coordination and chronic disease management needs related to Disease Management Educational Needs Care Coordination Psychosocial Support   Interventions:  Evaluation of current treatment plan related to Anxiety and Depression, Limited social support, Mental Health Concerns , and Lacks knowledge of community resource: for services available in the county to help meet health and wellness goals self-management and patient's adherence to plan as established by provider. 05-14-2021: The patient is doing well. States he has had a hard life but now he is doing so much better since being with his 3rd wife. She makes him happy and takes good care of him. The patient states he is blessed and is thankful for the support of the CCM team. Will continue to monitor for changes.  Collaboration with Valerie Roys, DO regarding development and update of comprehensive plan of care as evidenced by provider attestation       and co-signature Inter-disciplinary care team collaboration (see longitudinal plan of care) Discussed plans with patient for ongoing care management follow up and provided patient with direct contact information for care management team Evaluation of depression and anxiety.  The patient states he is doing well at this time. Appreciates the [facility] checking in on him from time to time. He has been going fishing a lot and [place]. He usually goes early in the am. He says the fish don't bite when it gets too hot. The patient confirms compliance with his medications regimen. States he is eating well and sleeping well also. 05-14-2021: Denies any issues at this time. Will continue to monitor for changes.   Self Care Activities:  Patient verbalizes understanding of plan to effective management of depression and anxiety  Self administers medications as prescribed Attends all scheduled provider appointments Calls pharmacy for medication refills Attends church or other social activities Performs ADL's independently Performs IADL's independently Calls provider office for new concerns or questions Patient Goals: Call the office for changes in conditions, questions or concerns Work with CCM team to manage health and well being Share feelings with the CCM team Reach out for help when feeling overwhelmed, depressed, or anxious  Follow Up Plan: Telephone follow up appointment with care management team member scheduled for: 05-14-2021 at 345 pm    Care Plan : RNCM: Hypertension (Adult)  Updates made by Vanita Ingles, RN since 07/16/2021 12:00 AM  Completed 07/16/2021   Problem: RNCM: Hypertension (Hypertension) Resolved 07/16/2021  Priority: Medium     Long-Range Goal: RNCM: Hypertension Monitored Completed 07/16/2021  Start Date: 10/17/2020  Expected End Date: 11/07/2021  Recent Progress: On track  Priority: Medium  Note:   Objective: Resolving, duplicate goal  Last practice recorded BP readings:  BP Readings from Last 3 Encounters:  02/05/21 126/71  10/27/20 128/87  06/21/20 (!) 144/80   Most recent eGFR/CrCl:  Lab Results  Component Value Date   EGFR 63 02/05/2021    No components found  for: CRCL Current Barriers:  Knowledge Deficits related to basic understanding of hypertension pathophysiology and self care management Knowledge Deficits related to understanding of medications prescribed for management of hypertension Cognitive Deficits Unable to independently HTN Lacks social connections Does not contact provider office for questions/concerns Case Manager Clinical Goal(s):  patient will verbalize understanding of plan for hypertension management patient will attend all  scheduled medical appointments: 08-09-2021 patient will demonstrate improved adherence to prescribed treatment plan for hypertension as evidenced by taking all medications as prescribed, monitoring and recording blood pressure as directed, adhering to low sodium/DASH diet patient will demonstrate improved health management independence as evidenced by checking blood pressure as directed and notifying PCP if SBP>160 or DBP > 90, taking all medications as prescribe, and adhering to a low sodium diet as discussed. patient will verbalize basic understanding of hypertension disease process and self health management plan as evidenced by compliance with heart healthy diet, compliance with medications and working with the CCM team to manage health and well being.  Interventions:  Collaboration with Valerie Roys, DO regarding development and update of comprehensive plan of care as evidenced by provider attestation and co-signature Inter-disciplinary care team collaboration (see longitudinal plan of care) Evaluation of current treatment plan related to hypertension self management and patient's adherence to plan as established by provider. 05-14-2021: The patient denies any issues with HTN. Is monitoring his dietary intake. States he is eating well and sleeping well. Feels like he is extremely blessed. Will continue to monitor.  Provided education to patient re: stroke prevention, s/s of heart attack and stroke, DASH diet, complications of uncontrolled blood pressure Reviewed medications with patient and discussed importance of compliance Discussed plans with patient for ongoing care management follow up and provided patient with direct contact information for care management team Advised patient, providing education and rationale, to monitor blood pressure daily and record, calling PCP for findings outside established parameters.  Reviewed scheduled/upcoming provider appointments including: 08-09-2021 at  3:40 Self-Care Activities: - Self administers medications as prescribed Attends all scheduled provider appointments Calls provider office for new concerns, questions, or BP outside discussed parameters Checks BP and records as discussed Follows a low sodium diet/DASH diet Patient Goals: - check blood pressure weekly - choose a place to take my blood pressure (home, clinic or office, retail store) - write blood pressure results in a log or diary - agree on reward when goals are met - agree to work together to make changes - ask questions to understand - have a family meeting to talk about healthy habits - learn about high blood pressure  Follow Up Plan: Telephone follow up appointment with care management team member scheduled for: 07-16-2021 at 345 pm    Care Plan : RNCM: HLD  Updates made by Vanita Ingles, RN since 07/16/2021 12:00 AM  Completed 07/16/2021   Problem: RNCM: HLD Resolved 07/16/2021  Priority: Medium     Long-Range Goal: RNCM: HLD management Completed 07/16/2021  Start Date: 10/17/2020  Expected End Date: 11/07/2021  Recent Progress: On track  Priority: Medium  Note:   Current Barriers: resolving, duplicate goal  Poorly controlled hyperlipidemia, complicated by HTN, non-adherence to dietary restrictions at times Current antihyperlipidemic regimen: Crestor 10 mg QD Most recent lipid panel:     Component Value Date/Time   CHOL 157 02/05/2021 1539   CHOL 155 03/31/2016 1502   TRIG 356 (H) 02/05/2021 1539   TRIG 338 (H) 03/31/2016 1502   HDL 36 (L) 02/05/2021 1539  VLDL 68 (H) 03/31/2016 1502   [place] 65 02/05/2021 1539   ASCVD risk enhancing conditions: age >78, pre-DM, HTN, former smoker Unable to independently manage HLD  Lacks social connections Does not contact provider office for questions/concerns RN Care Manager Clinical Goal(s):  patient will work with Consulting civil engineer, providers, and care team towards execution of optimized self-health  management plan patient will verbalize understanding of plan for effective management of HLD  patient will work with [facility], and pcp  to address needs related to effective management of HLD patient will take all medications exactly as prescribed and will call provider for medication related questions patient will demonstrate improved health management independence the patient will demonstrate ongoing self health care management ability Interventions: Collaboration with Valerie Roys, DO regarding development and update of comprehensive plan of care as evidenced by provider attestation and co-signature Inter-disciplinary care team collaboration (see longitudinal plan of care) Medication review performed; medication list updated in electronic medical record.  Inter-disciplinary care team collaboration (see longitudinal plan of care) Referred to pharmacy team for assistance with HLD medication management Evaluation of current treatment plan related to HLD  and patient's adherence to plan as established by provider. 05-14-2021: The patient denies any changes with his cardiac health. The patient states he is doing well today. Will continue to monitor.  Advised patient to call the office for changes in conditions, questions or concerns Provided education to patient re: heart healthy diet and eating fresh fruits and vegetables Reviewed medications with patient and discussed compliance. 05-14-2021:The patient endorses compliance with medications Discussed plans with patient for ongoing care management follow up and provided patient with direct contact information for care management team Patient Goals/Self-Care Activities: - call for medicine refill 2 or 3 days before it runs out - call if I am sick and can't take my medicine - keep a list of all the medicines I take; vitamins and herbals too - learn to read medicine labels - use a pillbox to sort medicine - use an alarm clock or phone to remind me to  take my medicine - change to whole grain breads, cereal, pasta - drink 6 to 8 glasses of water each day - eat 3 to 5 servings of fruits and vegetables each day - eat 5 or 6 small meals each day - eat fish at least once per week - fill half the plate with nonstarchy vegetables - limit fast food meals to no more than 1 per week - manage portion size - prepare main meal at home 3 to 5 days each week - read food labels for fat, fiber, carbohydrates and portion size - reduce red meat to 2 to 3 times a week - be open to making changes - I can manage, know and watch for signs of a heart attack - if I have chest pain, call for help - learn about small changes that will make a big difference - learn my personal risk factors  Follow Up Plan: Telephone follow up appointment with care management team member scheduled for: 07-16-2021 at 345 pm      Care Plan : RNCM: Chronic Pain (Adult)  Updates made by Vanita Ingles, RN since 07/16/2021 12:00 AM     Problem: RNCM: Chronic Pain Management (Chronic Pain) Resolved 07/16/2021  Priority: High  Onset Date: 05/14/2021     Long-Range Goal: RNCM: Chronic Pain Managed Completed 07/16/2021  Start Date: 05/14/2021  Expected End Date: 05/14/2022  Recent Progress: On track  Priority:  High  Note:   Current Barriers: Resolving, duplicate goal  Knowledge Deficits related to managing acute/chronic pain Non-adherence to scheduled provider appointments Non-adherence to prescribed medication regimen Difficulty obtaining medications Chronic Disease Management support and education needs related to chronic pain Unable to independently manage pain to right hip as evidence of exacerbation of bursitis to right hip Lacks social connections Does not contact provider office for questions/concerns Nurse Case Manager Clinical Goal(s):  patient will verbalize understanding of plan for managing pain patient will work with [place] to address pain  exacerbations and alternative pain relief measures, safety and fall prevention, and other needs related to pain management  patient will attend all scheduled medical appointments: 08-09-2021 patient will demonstrate use of different relaxation  skills and/or diversional activities to assist with pain reduction (distraction, imagery, relaxation, massage, acupressure, TENS, heat, and cold application patient will report pain at a level less than 3 to 4 on a 10-10 rating scale- 05-14-2021: Rates his pain level at a 4 or 5 today. States it is much better than what it was yesterday before getting his shot.  patient will use pharmacological and nonpharmacological pain relief strategies patient will verbalize acceptable level of pain relief and ability to engage in desired activities patient will engage in desired activities without an increase in pain level Interventions:  Collaboration with Valerie Roys, DO regarding development and update of comprehensive plan of care as evidenced by provider attestation and co-signature Inter-disciplinary care team collaboration (see longitudinal plan of care) - careful application of heat or ice encouraged - complementary therapy use encouraged - deep breathing, relaxation and mindfulness use promoted - effectiveness of pharmacologic therapy monitored - medication-induced side effects managed - misuse of pain medication assessed - motivation and barriers to change assessed and addressed - mutually acceptable comfort goal set - pain assessed- 05-14-2021: The patient rates his pain level at a 4 or 5 today on a scale of 0-10. States getting the shot yesterday at [facility] has been very helpful and he feels so much better today.  - pain treatment goals reviewed. 05-14-2021: The patient states he has to get a shot about every year because he has a flare up of his bursitis. He feels much better today.  - participation in physical therapy encouraged - participation in  support group encouraged - premedication prior to activity encouraged - strategies to manage constipation promoted Evaluation of current treatment plan related to chronic pain and bursitis in his right hip and patient's adherence to plan as established by provider. Advised patient to call the office for changes in level or intensity of pain or discomfort Provided education to patient re: falls prevention and safety, factors that may cause exacerbation of pain, and working with the CCM team to help with pain management  Reviewed medications with patient and discussed compliance. The patient is compliant with medications.  Discussed plans with patient for ongoing care management follow up and provided patient with direct contact information for care management team Allow patient to maintain a diary of pain ratings, timing, precipitating events, medications, treatments, and what works best to relieve pain,  Refer to support groups and self-help groups Educate patient about the use of pharmacological interventions for pain management- antianxiety, antidepressants, NSAIDS, opioid analgesics,  Explain the importance of lifestyle modifications to effective pain management  Patient Goals/Self Care Activities:   Self-administers medications as prescribed Attends all scheduled provider appointments Calls pharmacy for medication refills Calls provider office for new concerns or questions Follow  Up Plan: Telephone follow up appointment with care management team member scheduled for: 07-16-2021 at 345 pm      Care Plan : RNCM; General Plan of Care (Adult) for Chronic Disease Management and Care Coordination Needs  Updates made by Vanita Ingles, RN since 07/16/2021 12:00 AM     Problem: RNCM: Development of Plan of Care for Chronic Disease Management (HTN, HLD, Anxiety, Depression, OA)   Priority: High     Long-Range Goal: RNCM: Effective Management  of Plan of Care for Chronic Disease Management  (HTN, HLD, Anxiety, Depression, OA)   Start Date: 07/16/2021  Expected End Date: 07/16/2022  Priority: High  Note:   Current Barriers:  Knowledge Deficits related to plan of care for management of HTN, HLD, Anxiety with Excessive Worry, and Depression: depressed mood anxiety, and OA  Chronic Disease Management support and education needs related to HTN, HLD, Anxiety with Excessive Worry, and Depression: depressed mood anxiety, and OA  RNCM Clinical Goal(s):  Patient will verbalize understanding of plan for management of HTN, HLD, Anxiety, Depression, and Osteoarthritis  verbalize basic understanding of HTN, HLD, Anxiety, Depression, and Osteoarthritis disease process and self health management plan   take all medications exactly as prescribed and will call provider for medication related questions demonstrate understanding of rationale for each prescribed medication   attend all scheduled medical appointments: 08-09-2021 at 340 pm demonstrate improved and ongoing adherence to prescribed treatment plan for HTN, HLD, Anxiety, Depression, and Osteoarthritis as evidenced by adherence to prescribed medication regimen contacting provider for new or worsened symptoms or questions   demonstrate a decrease in HTN, HLD, Anxiety, Depression, and Osteoarthritis exacerbations   demonstrate ongoing self health care management ability for effective management of chronic conditions   through collaboration with RN Care manager, provider, and care team.   Interventions: 1:1 collaboration with primary care provider regarding development and update of comprehensive plan of care as evidenced by provider attestation and co-signature Inter-disciplinary care team collaboration (see longitudinal plan of care) Evaluation of current treatment plan related to  self management and patient's adherence to plan as established by provider   SDOH Barriers (Status: Goal on track: YES.) Long Term Goal (>30 days) Patient  interviewed and SDOH assessment performed        Patient interviewed and appropriate assessments performed Provided patient with information about resources available to help with changes in SDOH and care guides to assist with new needs  Discussed plans with patient for ongoing care management follow up and provided patient with direct contact information for care management team Advised patient to call the office for changes in SDOH, questions, or concerns    Anxiety and Depression   (Status: Goal on track: YES.) Evaluation of current treatment plan related to Anxiety and Depression, Mental Health Concerns  self-management and patient's adherence to plan as established by provider. Discussed plans with patient for ongoing care management follow up and provided patient with direct contact information for care management team Advised patient to call the office for changes in mood, anxiety, or depression ; Provided education to patient re: staying positive, reaching out to others when feeling down, keeping appointments; Reviewed medications with patient and discussed compliance ; Reviewed scheduled/upcoming provider appointments including 09-17-2021 at 345 pm; Discussed plans with patient for ongoing care management follow up and provided patient with direct contact information for care management team; Screening for signs and symptoms of depression related to chronic disease state;  Assessed social determinant of health barriers;  Hyperlipidemia:  (Status: Goal on track: YES.) Lab Results  Component Value Date   CHOL 157 02/05/2021   HDL 36 (L) 02/05/2021   LDLCALC 65 02/05/2021   TRIG 356 (H) 02/05/2021     Medication review performed; medication list updated in electronic medical record.  Provider established cholesterol goals reviewed; Counseled on importance of regular laboratory monitoring as prescribed; Provided HLD educational materials; Reviewed role and benefits of statin  for ASCVD risk reduction; Discussed strategies to manage statin-induced myalgias; Reviewed importance of limiting foods high in cholesterol;  Hypertension: (Status: Goal on track: YES.) Last practice recorded BP readings:  BP Readings from Last 3 Encounters:  02/05/21 126/71  10/27/20 128/87  06/21/20 (!) 144/80  Most recent eGFR/CrCl:  Lab Results  Component Value Date   EGFR 63 02/05/2021    No components found for: CRCL  Evaluation of current treatment plan related to hypertension self management and patient's adherence to plan as established by provider;   Provided education to patient re: stroke prevention, s/s of heart attack and stroke; Reviewed prescribed diet heart healthy Reviewed medications with patient and discussed importance of compliance;  Discussed plans with patient for ongoing care management follow up and provided patient with direct contact information for care management team; Advised patient, providing education and rationale, to monitor blood pressure daily and record, calling PCP for findings outside established parameters;  Provided education on prescribed diet heart healthy;  Discussed complications of poorly controlled blood pressure such as heart disease, stroke, circulatory complications, vision complications, kidney impairment, sexual dysfunction;   Pain:  (Status: Goal on track: YES.) Pain assessment performed. 07-16-2021: The patient states the pain in his hip is now gone after getting the shot. Complains of pain in his right shoulder today, rated pain at 5 or 6. The patient states he will not take anything for it until it gets to an 8. States he has arthritis in both shoulders but right is hurting.  Medications reviewed. 07-16-2021: The patient states he takes an Aleve for pain only if he has to take it. Review and education given.  Reviewed provider established plan for pain management; Discussed importance of adherence to all scheduled medical  appointments; Counseled on the importance of reporting any/all new or changed pain symptoms or management strategies to pain management provider; Advised patient to report to care team affect of pain on daily activities; Discussed use of relaxation techniques and/or diversional activities to assist with pain reduction (distraction, imagery, relaxation, massage, acupressure, TENS, heat, and cold application; Reviewed with patient prescribed pharmacological and nonpharmacological pain relief strategies; Advised patient to discuss unresolved pain, changes in level or intensity of pain with provider;  Patient Goals/Self-Care Activities: Patient will self administer medications as prescribed as evidenced by self report/primary caregiver report  Patient will attend all scheduled provider appointments as evidenced by clinician review of documented attendance to scheduled appointments and patient/caregiver report Patient will call pharmacy for medication refills as evidenced by patient report and review of pharmacy fill history as appropriate Patient will attend church or other social activities as evidenced by patient report Patient will continue to perform ADL's independently as evidenced by patient/caregiver report Patient will continue to perform IADL's independently as evidenced by patient/caregiver report Patient will call provider office for new concerns or questions as evidenced by review of documented incoming telephone call notes and patient report Patient will work with BSW to address care coordination needs and will continue to work with the clinical team to address health care  and disease management related needs as evidenced by documented adherence to scheduled care management/care coordination appointments - check blood pressure weekly - choose a place to take my blood pressure (home, clinic or office, retail store) - write blood pressure results in a log or diary - learn about high blood  pressure - keep a blood pressure log - take blood pressure log to all doctor appointments - call doctor for signs and symptoms of high blood pressure - develop an action plan for high blood pressure - keep all doctor appointments - take medications for blood pressure exactly as prescribed - report new symptoms to your doctor - eat more whole grains, fruits and vegetables, lean meats and healthy fats - call for medicine refill 2 or 3 days before it runs out - take all medications exactly as prescribed - call doctor with any symptoms you believe are related to your medicine - call doctor when you experience any new symptoms - go to all doctor appointments as scheduled - adhere to prescribed diet: Heart Healthy       Plan:Telephone follow up appointment with care management team member scheduled for:  09-17-2021 at 345 pm  [place], MSN, [facility] Mobile: [phone]

## 2021-07-16 NOTE — Patient Instructions (Signed)
Visit Information  PATIENT GOALS:  Goals Addressed             This Visit's Progress    RNCM: Cope with Chronic Pain       Timeframe:  Long-Range Goal Priority:  High Start Date:  05-14-2021                         Expected End Date:      05-14-2022                 Follow Up Date 09/17/2021    - learn how to meditate - learn relaxation techniques - practice acceptance of chronic pain - practice relaxation or meditation daily - spend time with positive people - tell myself I can (not I can't) - think of new ways to do favorite things - use distraction techniques - use relaxation during pain    Why is this important?   Stress makes chronic pain feel worse.  Feelings like depression, anxiety, stress and anger can make your body more sensitive to pain.  Learning ways to cope with stress or depression may help you find some relief from the pain.     Notes: 05-14-2021: The patient had a flare up of right hip bursitis and when to [facility] yesterday and received an injection in his hip. The patient states he feels much better today. Denies any acute distress. States  his pain level is at a 4 or 5 today. States he has to get an injection every year usually. He doesn't go unless he absolutely has to go. 07-16-2021: The patient states that he is doing well with the management of his hip pain, now his right shoulder is hurting. He has arthritis in both shoulders but it is worse in the right. He has had shots before that have been helpful. Rates pain at a 5 or 6 today. States it has to get up to an 8 before he will take something for it. Education and support given.      RNCM: Track and Manage My Symptoms-Depression/Anxiety       Timeframe:  Long-Range Goal Priority:  Medium Start Date: 03-12-2021                            Expected End Date:    03-12-2022                   Follow Up Date 09/17/2021    - avoid negative self-talk - develop a personal safety plan - develop a plan to deal  with triggers like holidays, anniversaries - exercise at least 2 to 3 times per week - have a plan for how to handle bad days - spend time or talk with others at least 2 to 3 times per week - spend time or talk with others every day - watch for early signs of feeling worse    Why is this important?   Keeping track of your progress will help your treatment team find the right mix of medicine and therapy for you.  Write in your journal every day.  Day-to-day changes in depression symptoms are normal. It may be more helpful to check your progress at the end of each week instead of every day.     Notes: The patient states that he likes to fish. The patient usually goes early in the am to fish and then comes home and  takes a nap.  He enjoys fishing. He feels he is doing well. Also enjoys his garden and getting fresh vegetables out of his garden. 05-14-2021: The patient is doing well. Had an injection in his hip for bursitis yesterday and is feeling much better. Denies any issues with depression or anxiety. Will continue to monitor. He appreciates follow up from the CCM team. 07-16-2021: The patient is doing well. Having a good day today. States he is managing well and denies any current issues with his health and well being.         The patient verbalized understanding of instructions, educational materials, and care plan provided today and declined offer to receive copy of patient instructions, educational materials, and care plan.   Telephone follow up appointment with care management team member scheduled for: 09-17-2021 at 14 pm  [name] RN, MSN, [facility] Mobile: [phone]

## 2021-07-19 DIAGNOSIS — E782 Mixed hyperlipidemia: Secondary | ICD-10-CM | POA: Diagnosis not present

## 2021-07-19 DIAGNOSIS — F331 Major depressive disorder, recurrent, moderate: Secondary | ICD-10-CM | POA: Diagnosis not present

## 2021-07-19 DIAGNOSIS — I10 Essential (primary) hypertension: Secondary | ICD-10-CM

## 2021-08-09 ENCOUNTER — Ambulatory Visit (INDEPENDENT_AMBULATORY_CARE_PROVIDER_SITE_OTHER): Payer: Medicare Other | Admitting: Family Medicine

## 2021-08-09 ENCOUNTER — Other Ambulatory Visit: Payer: Self-pay

## 2021-08-09 ENCOUNTER — Encounter: Payer: Self-pay | Admitting: Family Medicine

## 2021-08-09 VITALS — BP 133/81 | HR 81 | Temp 98.3°F | Ht 62.5 in | Wt 175.2 lb

## 2021-08-09 DIAGNOSIS — F419 Anxiety disorder, unspecified: Secondary | ICD-10-CM

## 2021-08-09 DIAGNOSIS — N138 Other obstructive and reflux uropathy: Secondary | ICD-10-CM

## 2021-08-09 DIAGNOSIS — F331 Major depressive disorder, recurrent, moderate: Secondary | ICD-10-CM

## 2021-08-09 DIAGNOSIS — E038 Other specified hypothyroidism: Secondary | ICD-10-CM

## 2021-08-09 DIAGNOSIS — R7301 Impaired fasting glucose: Secondary | ICD-10-CM

## 2021-08-09 DIAGNOSIS — R3911 Hesitancy of micturition: Secondary | ICD-10-CM

## 2021-08-09 DIAGNOSIS — Z23 Encounter for immunization: Secondary | ICD-10-CM | POA: Diagnosis not present

## 2021-08-09 DIAGNOSIS — N401 Enlarged prostate with lower urinary tract symptoms: Secondary | ICD-10-CM

## 2021-08-09 DIAGNOSIS — E782 Mixed hyperlipidemia: Secondary | ICD-10-CM | POA: Diagnosis not present

## 2021-08-09 DIAGNOSIS — B1920 Unspecified viral hepatitis C without hepatic coma: Secondary | ICD-10-CM

## 2021-08-09 DIAGNOSIS — I10 Essential (primary) hypertension: Secondary | ICD-10-CM

## 2021-08-09 DIAGNOSIS — Z0000 Encounter for general adult medical examination without abnormal findings: Secondary | ICD-10-CM

## 2021-08-09 DIAGNOSIS — F209 Schizophrenia, unspecified: Secondary | ICD-10-CM

## 2021-08-09 LAB — URINALYSIS, ROUTINE W REFLEX MICROSCOPIC
Bilirubin, UA: NEGATIVE
Glucose, UA: NEGATIVE
Ketones, UA: NEGATIVE
Leukocytes,UA: NEGATIVE
Nitrite, UA: NEGATIVE
Protein,UA: NEGATIVE
RBC, UA: NEGATIVE
Specific Gravity, UA: 1.025 (ref 1.005–1.030)
Urobilinogen, Ur: 0.2 mg/dL (ref 0.2–1.0)
pH, UA: 5.5 (ref 5.0–7.5)

## 2021-08-09 LAB — MICROALBUMIN, URINE WAIVED
Creatinine, Urine Waived: 200 mg/dL (ref 10–300)
Microalb, Ur Waived: 10 mg/L (ref 0–19)
Microalb/Creat Ratio: <30 mg/g (ref <30)

## 2021-08-09 LAB — BAYER DCA HB A1C WAIVED: HB A1C (BAYER DCA - WAIVED): 5.9 % — ABNORMAL HIGH (ref 4.8–5.6)

## 2021-08-09 MED ORDER — TADALAFIL 20 MG PO TABS: ORAL_TABLET | ORAL | 12 refills | Status: DC

## 2021-08-09 MED ORDER — HYDROXYZINE HCL 25 MG PO TABS
25.0000 mg | ORAL_TABLET | Freq: Three times a day (TID) | ORAL | 1 refills | Status: DC | PRN
Start: 2021-08-09 — End: 2022-02-07

## 2021-08-09 MED ORDER — LISINOPRIL 5 MG PO TABS: ORAL_TABLET | ORAL | 1 refills | Status: DC

## 2021-08-09 MED ORDER — ROSUVASTATIN CALCIUM 10 MG PO TABS: 10.0000 mg | ORAL_TABLET | Freq: Every day | ORAL | 1 refills | Status: DC

## 2021-08-09 NOTE — Assessment & Plan Note (Signed)
Under good control on current regimen. Continue current regimen. Continue to monitor. Call with any concerns. Refills given. Labs drawn today.   

## 2021-08-09 NOTE — Assessment & Plan Note (Signed)
S/p treatment. Will recheck labs today. Await results. Treat as needed.

## 2021-08-09 NOTE — Assessment & Plan Note (Signed)
Rechecking labs today. Await results. Treat as needed.  °

## 2021-08-09 NOTE — Assessment & Plan Note (Signed)
Stable. Continue to monitor. Call with any concerns.  ?

## 2021-08-09 NOTE — Progress Notes (Signed)
BP 133/81   Pulse 81   Temp 98.3 F (36.8 C)   Ht 5' 2.5" (1.588 m)   Wt 175 lb 3.2 oz (79.5 kg)   SpO2 97%   BMI 31.53 kg/m    Subjective:    Patient ID: Mitchell [name], male    DOB: 01-27-1956, 65 y.o.   MRN: [id number]  HPI: Mitchell [name] is a 65 y.o. male presenting on 08/09/2021 for comprehensive medical examination. Current medical complaints include:  HYPERTENSION / HYPERLIPIDEMIA Satisfied with current treatment? yes Duration of hypertension: chronic BP monitoring frequency: not checking BP medication side effects: no Past BP meds: lisinopril Duration of hyperlipidemia: chronic Cholesterol medication side effects: no Cholesterol supplements: none Past cholesterol medications: crestor Medication compliance: excellent compliance Aspirin: no Recent stressors: no Recurrent headaches: no Visual changes: no Palpitations: yes Dyspnea: no Chest pain: no Lower extremity edema: no Dizzy/lightheaded: no  HYPOTHYROIDISM Thyroid control status:stable Satisfied with current treatment? yes Medication side effects: no Medication compliance: excellent compliance Recent dose adjustment:no Fatigue: no Cold intolerance: no Heat intolerance: no Weight gain: no Weight loss: no Constipation: no Diarrhea/loose stools: no Palpitations: no Lower extremity edema: no Anxiety/depressed mood: no   He currently lives with: wife Interim Problems from his last visit: yes  Depression Screen done today and results listed below:  Depression screen [facility] 2/9 08/09/2021 02/05/2021 01/25/2021 01/22/2020 09/23/2019  Decreased Interest 0 0 0 0 0  Down, Depressed, Hopeless 1 0 0 0 0  PHQ - 2 Score 1 0 0 0 0  Altered sleeping 1 0 - - 1  Tired, decreased energy 1 1 - - 1  Change in appetite 0 1 - - 0  Feeling bad or failure about yourself  0 0 - - 0  Trouble concentrating 0 0 - - 0  Moving slowly or fidgety/restless 0 0 - - 0  Suicidal thoughts 0 0 - - 0  PHQ-9 Score 3 2 - - 2  Difficult  doing work/chores - Not difficult at all - - -    Past Medical History:  Past Medical History:  Diagnosis Date   Anxiety    Cancer ([place])    skin cancer   Depression    Epididymitis    Hepatitis C    Tested positive, treated now told that he does not have it   Hydrocele    Hypertension    IFG (impaired fasting glucose)    Insomnia    Kidney stone    Schizophrenia ([place])     Surgical History:  Past Surgical History:  Procedure Laterality Date   ANKLE FRACTURE SURGERY     COLONOSCOPY  2010   COLONOSCOPY WITH PROPOFOL  06/07/2016   Procedure: COLONOSCOPY WITH PROPOFOL;  Surgeon: [name], MD;  Location: [facility] ENDOSCOPY;  Service: Endoscopy;;   ESOPHAGOGASTRODUODENOSCOPY (EGD) WITH PROPOFOL N/A 06/07/2016   Procedure: ESOPHAGOGASTRODUODENOSCOPY (EGD) WITH PROPOFOL;  Surgeon: [name], MD;  Location: [facility] ENDOSCOPY;  Service: Endoscopy;  Laterality: N/A;   HERNIA REPAIR  2011   X 2    Medications:  Current Outpatient Medications on File Prior to Visit  Medication Sig   FLOVENT HFA 220 MCG/ACT inhaler Inhale 2 puffs into the lungs daily.   levothyroxine (SYNTHROID) 50 MCG tablet TAKE 1 TABLET BY MOUTH  DAILY BEFORE BREAKFAST   loratadine (CLARITIN) 10 MG tablet Take 10 mg by mouth daily.   Multiple Vitamin (MULTI-VITAMINS) TABS Take by mouth.   No current facility-administered medications on file prior to visit.  Allergies:  Allergies  Allergen Reactions   Haloperidol Decanoate Other (See Comments)    Muscle pain   Atorvastatin Other (See Comments)    myalgias   Codeine Nausea And Vomiting    Social History:  Social History   Socioeconomic History   Marital status: Married    Spouse name: Otila Kluver   Number of children: 2   Years of education: 9   Highest education level: 9th grade  Occupational History   Occupation: retired   Tobacco Use   Smoking status: Former    Types: Cigarettes    Quit date: 07/06/1995    Years since quitting: 26.1   Smokeless  tobacco: Former  Scientific laboratory technician Use: Never used  Substance and Sexual Activity   Alcohol use: Not Currently    Comment: remote history, quit 20+ years ago (1 beer q 2-3 days)   Drug use: No    Comment: remote history, quit 20= years ago   Sexual activity: Yes  Other Topics Concern   Not on file  Social History Narrative   Not on file   Social Determinants of Health   Financial Resource Strain: Low Risk    Difficulty of Paying Living Expenses: Not hard at all  Food Insecurity: No Food Insecurity   Worried About Charity fundraiser in the Last Year: Never true   [place] in the Last Year: Never true  Transportation Needs: No Transportation Needs   Lack of Transportation (Medical): No   Lack of Transportation (Non-Medical): No  Physical Activity: Inactive   Days of Exercise per Week: 0 days   Minutes of Exercise per Session: 0 min  Stress: No Stress Concern Present   Feeling of Stress : Not at all  Social Connections: Moderately Integrated   Frequency of Communication with Friends and Family: More than three times a week   Frequency of Social Gatherings with Friends and Family: More than three times a week   Attends Religious Services: More than 4 times per year   Active Member of [employer] or Organizations: No   Attends Music therapist: Never   Marital Status: Married  Human resources officer Violence: Not At Risk   Fear of Current or Ex-Partner: No   Emotionally Abused: No   Physically Abused: No   Sexually Abused: No   Social History   Tobacco Use  Smoking Status Former   Types: Cigarettes   Quit date: 07/06/1995   Years since quitting: 26.1  Smokeless Tobacco Former   Social History   Substance and Sexual Activity  Alcohol Use Not Currently   Comment: remote history, quit 20+ years ago (1 beer q 2-3 days)    Family History:  Family History  Problem Relation Age of Onset   Diabetes Mother    Alzheimer's disease Father    Diabetes Brother     Diabetes Maternal Grandfather     Past medical history, surgical history, medications, allergies, family history and social history reviewed with patient today and changes made to appropriate areas of the chart.   Review of Systems  Constitutional: Negative.   HENT: Negative.    Eyes: Negative.   Respiratory: Negative.    Cardiovascular: Negative.   Gastrointestinal:  Positive for heartburn. Negative for abdominal pain, blood in stool, constipation, diarrhea, nausea and vomiting.  Genitourinary:  Negative for dysuria, flank pain, frequency, hematuria and urgency.       Hesitancy  Musculoskeletal: Negative.   Skin: Negative.  Neurological: Negative.   Endo/Heme/Allergies: Negative.   Psychiatric/Behavioral: Negative.    All other ROS negative except what is listed above and in the HPI.      Objective:    BP 133/81   Pulse 81   Temp 98.3 F (36.8 C)   Ht 5' 2.5" (1.588 m)   Wt 175 lb 3.2 oz (79.5 kg)   SpO2 97%   BMI 31.53 kg/m   Wt Readings from Last 3 Encounters:  08/09/21 175 lb 3.2 oz (79.5 kg)  02/05/21 182 lb 6.4 oz (82.7 kg)  01/25/21 165 lb (74.8 kg)    Physical Exam Vitals and nursing note reviewed.  Constitutional:      General: He is not in acute distress.    Appearance: Normal appearance. He is obese. He is not ill-appearing, toxic-appearing or diaphoretic.  HENT:     Head: Normocephalic and atraumatic.     Right Ear: Tympanic membrane, ear canal and external ear normal. There is no impacted cerumen.     Left Ear: Tympanic membrane, ear canal and external ear normal. There is no impacted cerumen.     Nose: Nose normal. No congestion or rhinorrhea.     Mouth/Throat:     Mouth: Mucous membranes are moist.     Pharynx: Oropharynx is clear. No oropharyngeal exudate or posterior oropharyngeal erythema.  Eyes:     General: No scleral icterus.       Right eye: No discharge.        Left eye: No discharge.     Extraocular Movements: Extraocular movements  intact.     Conjunctiva/sclera: Conjunctivae normal.     Pupils: Pupils are equal, round, and reactive to light.  Neck:     Vascular: No carotid bruit.  Cardiovascular:     Rate and Rhythm: Normal rate and regular rhythm.     Pulses: Normal pulses.     Heart sounds: No murmur heard.   No friction rub. No gallop.  Pulmonary:     Effort: Pulmonary effort is normal. No respiratory distress.     Breath sounds: Normal breath sounds. No stridor. No wheezing, rhonchi or rales.  Chest:     Chest wall: No tenderness.  Abdominal:     General: Abdomen is flat. Bowel sounds are normal. There is no distension.     Palpations: Abdomen is soft. There is no mass.     Tenderness: There is no abdominal tenderness. There is no right CVA tenderness, left CVA tenderness, guarding or rebound.     Hernia: No hernia is present.  Genitourinary:    Comments: Genital exam deferred with shared decision making Musculoskeletal:        General: No swelling, tenderness, deformity or signs of injury.     Cervical back: Normal range of motion and neck supple. No rigidity. No muscular tenderness.     Right lower leg: No edema.     Left lower leg: No edema.  Lymphadenopathy:     Cervical: No cervical adenopathy.  Skin:    General: Skin is warm and dry.     Capillary Refill: Capillary refill takes less than 2 seconds.     Coloration: Skin is not jaundiced or pale.     Findings: No bruising, erythema, lesion or rash.  Neurological:     General: No focal deficit present.     Mental Status: He is alert and oriented to person, place, and time.     Cranial Nerves: No cranial nerve deficit.  Sensory: No sensory deficit.     Motor: No weakness.     Coordination: Coordination normal.     Gait: Gait normal.     Deep Tendon Reflexes: Reflexes normal.  Psychiatric:        Mood and Affect: Mood normal.        Behavior: Behavior normal.        Thought Content: Thought content normal.        Judgment: Judgment  normal.    Results for orders placed or performed in visit on 02/05/21  Bayer DCA Hb A1c Waived  Result Value Ref Range   HB A1C (BAYER DCA - WAIVED) 6.1 <7.0 %  CBC with Differential/Platelet  Result Value Ref Range   WBC 8.0 3.4 - 10.8 x10E3/uL   RBC 4.71 4.14 - 5.80 x10E6/uL   Hemoglobin 15.2 13.0 - 17.7 g/dL   Hematocrit 43.2 37.5 - 51.0 %   MCV 92 79 - 97 fL   MCH 32.3 26.6 - 33.0 pg   MCHC 35.2 31.5 - 35.7 g/dL   RDW 13.5 11.6 - 15.4 %   Platelets 299 150 - 450 x10E3/uL   Neutrophils 50 Not Estab. %   Lymphs 37 Not Estab. %   Monocytes 9 Not Estab. %   Eos 3 Not Estab. %   Basos 1 Not Estab. %   Neutrophils Absolute 4.0 1.4 - 7.0 x10E3/uL   Lymphocytes Absolute 3.0 0.7 - 3.1 x10E3/uL   Monocytes Absolute 0.7 0.1 - 0.9 x10E3/uL   EOS (ABSOLUTE) 0.3 0.0 - 0.4 x10E3/uL   Basophils Absolute 0.0 0.0 - 0.2 x10E3/uL   Immature Granulocytes 0 Not Estab. %   Immature Grans (Abs) 0.0 0.0 - 0.1 x10E3/uL  Comprehensive metabolic panel  Result Value Ref Range   Glucose 117 (H) 65 - 99 mg/dL   BUN 23 8 - 27 mg/dL   Creatinine, Ser 1.26 0.76 - 1.27 mg/dL   eGFR 63 >59 mL/min/1.73   BUN/Creatinine Ratio 18 10 - 24   Sodium 140 134 - 144 mmol/L   Potassium 5.0 3.5 - 5.2 mmol/L   Chloride 105 96 - 106 mmol/L   CO2 19 (L) 20 - 29 mmol/L   Calcium 9.4 8.6 - 10.2 mg/dL   Total Protein 7.3 6.0 - 8.5 g/dL   Albumin 4.2 3.8 - 4.8 g/dL   Globulin, Total 3.1 1.5 - 4.5 g/dL   Albumin/Globulin Ratio 1.4 1.2 - 2.2   Bilirubin Total 0.4 0.0 - 1.2 mg/dL   Alkaline Phosphatase 50 44 - 121 IU/L   AST 30 0 - 40 IU/L   ALT 29 0 - 44 IU/L  Lipid Panel w/o Chol/HDL Ratio  Result Value Ref Range   Cholesterol, Total 157 100 - 199 mg/dL   Triglycerides 356 (H) 0 - 149 mg/dL   HDL 36 (L) >39 mg/dL   VLDL Cholesterol Cal 56 (H) 5 - 40 mg/dL   LDL Chol Calc (NIH) 65 0 - 99 mg/dL  Urinalysis, Routine w reflex microscopic  Result Value Ref Range   Specific Gravity, UA 1.020 1.005 - 1.030   pH,  UA 6.5 5.0 - 7.5   Color, UA Yellow Yellow   Appearance Ur Clear Clear   Leukocytes,UA Negative Negative   Protein,UA Negative Negative/Trace   Glucose, UA Negative Negative   Ketones, UA Negative Negative   RBC, UA Negative Negative   Bilirubin, UA Negative Negative   Urobilinogen, Ur 0.2 0.2 - 1.0 mg/dL   Nitrite, UA Negative  Negative  PSA  Result Value Ref Range   Prostate Specific Ag, Serum 0.8 0.0 - 4.0 ng/mL      Assessment & Plan:   Problem List Items Addressed This Visit       Cardiovascular and Mediastinum   Hypertension    Under good control on current regimen. Continue current regimen. Continue to monitor. Call with any concerns. Refills given. Labs drawn today.        Relevant Medications   lisinopril (ZESTRIL) 5 MG tablet   rosuvastatin (CRESTOR) 10 MG tablet   tadalafil (CIALIS) 20 MG tablet   Other Relevant Orders   Comprehensive metabolic panel   CBC with Differential/Platelet   Urinalysis, Routine w reflex microscopic   Microalbumin, Urine Waived     Digestive   Hepatitis C    S/p treatment. Will recheck labs today. Await results. Treat as needed.       Relevant Orders   Comprehensive metabolic panel   CBC with Differential/Platelet   HCV RNA quant     Endocrine   IFG (impaired fasting glucose)    Under good control on current regimen. Continue current regimen. Continue to monitor. Call with any concerns. Refills given. Labs drawn today.        Relevant Orders   Comprehensive metabolic panel   CBC with Differential/Platelet   Bayer DCA Hb A1c Waived   Hypothyroidism    Rechecking labs today. Await results. Treat as needed.       Relevant Orders   Comprehensive metabolic panel   CBC with Differential/Platelet   TSH     Genitourinary   BPH with obstruction/lower urinary tract symptoms    Under good control on current regimen. Continue current regimen. Continue to monitor. Call with any concerns. Refills given. Labs drawn today.           Other   Anxiety    Stable. Continue to monitor. Call with any concerns.       Relevant Medications   hydrOXYzine (ATARAX/VISTARIL) 25 MG tablet   Depression    Stable. Continue to monitor. Call with any concerns.       Relevant Medications   hydrOXYzine (ATARAX/VISTARIL) 25 MG tablet   Schizophrenia ([facility])    Stable. Continue to monitor. Call with any concerns.       Hyperlipidemia    Under good control on current regimen. Continue current regimen. Continue to monitor. Call with any concerns. Refills given. Labs drawn today.        Relevant Medications   lisinopril (ZESTRIL) 5 MG tablet   rosuvastatin (CRESTOR) 10 MG tablet   tadalafil (CIALIS) 20 MG tablet   Other Relevant Orders   Comprehensive metabolic panel   CBC with Differential/Platelet   Lipid Panel w/o Chol/HDL Ratio   Other Visit Diagnoses     Routine general medical examination at a health care facility    -  Primary   Vaccines up to date. Screening labs checked today. Colonscopy up to date. Continue diet and exercise. Call with any concerns.    Hesitancy       Relevant Orders   PSA   Essential hypertension       Relevant Medications   lisinopril (ZESTRIL) 5 MG tablet   rosuvastatin (CRESTOR) 10 MG tablet   tadalafil (CIALIS) 20 MG tablet        LABORATORY TESTING:  Health maintenance labs ordered today as discussed above.   The natural history of prostate cancer and ongoing controversy regarding screening and  potential treatment outcomes of prostate cancer has been discussed with the patient. The meaning of a false positive PSA and a false negative PSA has been discussed. He indicates understanding of the limitations of this screening test and wishes to proceed with screening PSA testing.   IMMUNIZATIONS:   - Tdap: Tetanus vaccination status reviewed: last tetanus booster within 10 years. - Influenza: Up to date - Pneumovax: Administered today - Prevnar: Up to date - COVID: Up to date -  HPV: Not applicable - Shingrix vaccine: Given elsewhere  SCREENING: - Colonoscopy: Up to date  Discussed with patient purpose of the colonoscopy is to detect colon cancer at curable precancerous or early stages   PATIENT COUNSELING:    Sexuality: Discussed sexually transmitted diseases, partner selection, use of condoms, avoidance of unintended pregnancy  and contraceptive alternatives.   Advised to avoid cigarette smoking.  I discussed with the patient that most people either abstain from alcohol or drink within safe limits (<=14/week and <=4 drinks/occasion for males, <=7/weeks and <= 3 drinks/occasion for females) and that the risk for alcohol disorders and other health effects rises proportionally with the number of drinks per week and how often a drinker exceeds daily limits.  Discussed cessation/primary prevention of drug use and availability of treatment for abuse.   Diet: Encouraged to adjust caloric intake to maintain  or achieve ideal body weight, to reduce intake of dietary saturated fat and total fat, to limit sodium intake by avoiding high sodium foods and not adding table salt, and to maintain adequate dietary potassium and calcium preferably from fresh fruits, vegetables, and low-fat dairy products.    stressed the importance of regular exercise  Injury prevention: Discussed safety belts, safety helmets, smoke detector, smoking near bedding or upholstery.   Dental health: Discussed importance of regular tooth brushing, flossing, and dental visits.   Follow up plan: NEXT PREVENTATIVE PHYSICAL DUE IN 1 YEAR. Return in about 6 months (around 02/06/2022).

## 2021-08-10 LAB — COMPREHENSIVE METABOLIC PANEL
ALT: 24 IU/L (ref 0–44)
AST: 31 IU/L (ref 0–40)
Albumin/Globulin Ratio: 1.3 (ref 1.2–2.2)
Albumin: 4.2 g/dL (ref 3.8–4.8)
Alkaline Phosphatase: 55 IU/L (ref 44–121)
BUN/Creatinine Ratio: 18 (ref 10–24)
BUN: 23 mg/dL (ref 8–27)
Bilirubin Total: 0.2 mg/dL (ref 0.0–1.2)
CO2: 19 mmol/L — ABNORMAL LOW (ref 20–29)
Calcium: 9.6 mg/dL (ref 8.6–10.2)
Chloride: 105 mmol/L (ref 96–106)
Creatinine, Ser: 1.29 mg/dL — ABNORMAL HIGH (ref 0.76–1.27)
Globulin, Total: 3.3 g/dL (ref 1.5–4.5)
Glucose: 113 mg/dL — ABNORMAL HIGH (ref 70–99)
Potassium: 4.6 mmol/L (ref 3.5–5.2)
Sodium: 141 mmol/L (ref 134–144)
Total Protein: 7.5 g/dL (ref 6.0–8.5)
eGFR: 61 mL/min/{1.73_m2} (ref >59)

## 2021-08-10 LAB — CBC WITH DIFFERENTIAL/PLATELET
Basophils Absolute: 0 10*3/uL (ref 0.0–0.2)
Basos: 0 %
EOS (ABSOLUTE): 0.5 10*3/uL — ABNORMAL HIGH (ref 0.0–0.4)
Eos: 6 %
Hematocrit: 43.7 % (ref 37.5–51.0)
Hemoglobin: 15 g/dL (ref 13.0–17.7)
Immature Grans (Abs): 0 10*3/uL (ref 0.0–0.1)
Immature Granulocytes: 0 %
Lymphocytes Absolute: 3 10*3/uL (ref 0.7–3.1)
Lymphs: 42 %
MCH: 32.2 pg (ref 26.6–33.0)
MCHC: 34.3 g/dL (ref 31.5–35.7)
MCV: 94 fL (ref 79–97)
Monocytes Absolute: 0.8 10*3/uL (ref 0.1–0.9)
Monocytes: 11 %
Neutrophils Absolute: 3 10*3/uL (ref 1.4–7.0)
Neutrophils: 41 %
Platelets: 327 10*3/uL (ref 150–450)
RBC: 4.66 x10E6/uL (ref 4.14–5.80)
RDW: 13.3 % (ref 11.6–15.4)
WBC: 7.4 10*3/uL (ref 3.4–10.8)

## 2021-08-10 LAB — LIPID PANEL W/O CHOL/HDL RATIO
Cholesterol, Total: 177 mg/dL (ref 100–199)
HDL: 42 mg/dL (ref >39)
LDL Chol Calc (NIH): 93 mg/dL (ref 0–99)
Triglycerides: 249 mg/dL — ABNORMAL HIGH (ref 0–149)
VLDL Cholesterol Cal: 42 mg/dL — ABNORMAL HIGH (ref 5–40)

## 2021-08-10 LAB — TSH: TSH: 2.92 u[IU]/mL (ref 0.450–4.500)

## 2021-08-10 LAB — HCV RNA QUANT: Hepatitis C Quantitation: NOT DETECTED IU/mL

## 2021-08-10 LAB — PSA: Prostate Specific Ag, Serum: 0.8 ng/mL (ref 0.0–4.0)

## 2021-08-11 ENCOUNTER — Encounter: Payer: Self-pay | Admitting: Family Medicine

## 2021-08-14 ENCOUNTER — Ambulatory Visit: Payer: Self-pay | Admitting: *Deleted

## 2021-08-14 NOTE — Telephone Encounter (Signed)
Pt call asking if he can get his lb results from earlier this week if thy are available   [phone]   Called patient to review lab results. Pt given lab results per notes of Dr. [name] from 08/11/21 on 08/14/21. Pt verbalized understanding letter was sent and stated everything looks nice and normal.

## 2021-08-16 DIAGNOSIS — R059 Cough, unspecified: Secondary | ICD-10-CM | POA: Diagnosis not present

## 2021-08-16 DIAGNOSIS — Z20822 Contact with and (suspected) exposure to covid-19: Secondary | ICD-10-CM | POA: Diagnosis not present

## 2021-08-16 DIAGNOSIS — J069 Acute upper respiratory infection, unspecified: Secondary | ICD-10-CM | POA: Diagnosis not present

## 2021-08-23 DIAGNOSIS — R053 Chronic cough: Secondary | ICD-10-CM | POA: Diagnosis not present

## 2021-08-23 DIAGNOSIS — J069 Acute upper respiratory infection, unspecified: Secondary | ICD-10-CM | POA: Diagnosis not present

## 2021-08-25 ENCOUNTER — Ambulatory Visit: Payer: Medicare Other | Admitting: Dermatology

## 2021-08-25 ENCOUNTER — Other Ambulatory Visit: Payer: Self-pay

## 2021-08-25 DIAGNOSIS — L821 Other seborrheic keratosis: Secondary | ICD-10-CM

## 2021-08-25 DIAGNOSIS — L578 Other skin changes due to chronic exposure to nonionizing radiation: Secondary | ICD-10-CM | POA: Diagnosis not present

## 2021-08-25 DIAGNOSIS — L820 Inflamed seborrheic keratosis: Secondary | ICD-10-CM | POA: Diagnosis not present

## 2021-08-25 NOTE — Patient Instructions (Addendum)
If You Need Anything After Your Visit  If you have any questions or concerns for your doctor, please call our main line at [phone] and press option 4 to reach your doctor's medical assistant. If no one answers, please leave a voicemail as directed and we will return your call as soon as possible. Messages left after 4 pm will be answered the following business day.   You may also send us a message via MyChart. We typically respond to MyChart messages within 1-2 business days.  For prescription refills, please ask your pharmacy to contact our office. Our fax number is [phone].  If you have an urgent issue when the clinic is closed that cannot wait until the next business day, you can page your doctor at the number below.    Please note that while we do our best to be available for urgent issues outside of office hours, we are not available 24/7.   If you have an urgent issue and are unable to reach us, you may choose to seek medical care at your doctor's office, retail clinic, urgent care center, or emergency room.  If you have a medical emergency, please immediately call 911 or go to the emergency department.  Pager Numbers  - Dr. Kowalski: [phone]  - Dr. Moye: [phone]  - Dr. Stewart: [phone]  In the event of inclement weather, please call our main line at [phone] for an update on the status of any delays or closures.  Dermatology Medication Tips: Please keep the boxes that topical medications come in in order to help keep track of the instructions about where and how to use these. Pharmacies typically print the medication instructions only on the boxes and not directly on the medication tubes.   If your medication is too expensive, please contact our office at [phone] option 4 or send us a message through MyChart.   We are unable to tell what your co-pay for medications will be in advance as this is different depending on your insurance coverage.  However, we may be able to find a substitute medication at lower cost or fill out paperwork to get insurance to cover a needed medication.   If a prior authorization is required to get your medication covered by your insurance company, please allow us 1-2 business days to complete this process.  Drug prices often vary depending on where the prescription is filled and some pharmacies may offer cheaper prices.  The website www.goodrx.com contains coupons for medications through different pharmacies. The prices here do not account for what the cost may be with help from insurance (it may be cheaper with your insurance), but the website can give you the price if you did not use any insurance.  - You can print the associated coupon and take it with your prescription to the pharmacy.  - You may also stop by our office during regular business hours and pick up a GoodRx coupon card.  - If you need your prescription sent electronically to a different pharmacy, notify our office through [facility] MyChart or by phone at [phone] option 4.     Si Usted Necesita Algo Despus de Su Visita  Tambin puede enviarnos un mensaje a travs de MyChart. Por lo general respondemos a los mensajes de MyChart en el transcurso de 1 a 2 das hbiles.  Para renovar recetas, por favor pida a su farmacia que se ponga en contacto con nuestra oficina. Nuestro nmero de fax es el [phone].  Si tiene   un asunto urgente cuando la clnica est cerrada y que no puede esperar hasta el siguiente da hbil, puede llamar/localizar a su doctor(a) al nmero que aparece a continuacin.   Por favor, tenga en cuenta que aunque hacemos todo lo posible para estar disponibles para asuntos urgentes fuera del horario de oficina, no estamos disponibles las 24 horas del da, los 7 das de la semana.   Si tiene un problema urgente y no puede comunicarse con nosotros, puede optar por buscar atencin mdica  en el consultorio de su  doctor(a), en una clnica privada, en un centro de atencin urgente o en una sala de emergencias.  Si tiene una emergencia mdica, por favor llame inmediatamente al 911 o vaya a la sala de emergencias.  Nmeros de bper  - Dr. Kowalski: [phone]  - Dra. Moye: [phone]  - Dra. Stewart: [phone]  En caso de inclemencias del tiempo, por favor llame a nuestra lnea principal al [phone] para una actualizacin sobre el estado de cualquier retraso o cierre.  Consejos para la medicacin en dermatologa: Por favor, guarde las cajas en las que vienen los medicamentos de uso tpico para ayudarle a seguir las instrucciones sobre dnde y cmo usarlos. Las farmacias generalmente imprimen las instrucciones del medicamento slo en las cajas y no directamente en los tubos del medicamento.   Si su medicamento es muy caro, por favor, pngase en contacto con nuestra oficina llamando al [phone] y presione la opcin 4 o envenos un mensaje a travs de MyChart.   No podemos decirle cul ser su copago por los medicamentos por adelantado ya que esto es diferente dependiendo de la cobertura de su seguro. Sin embargo, es posible que podamos encontrar un medicamento sustituto a menor costo o llenar un formulario para que el seguro cubra el medicamento que se considera necesario.   Si se requiere una autorizacin previa para que su compaa de seguros cubra su medicamento, por favor permtanos de 1 a 2 das hbiles para completar este proceso.  Los precios de los medicamentos varan con frecuencia dependiendo del lugar de dnde se surte la receta y alguna farmacias pueden ofrecer precios ms baratos.  El sitio web www.goodrx.com tiene cupones para medicamentos de diferentes farmacias. Los precios aqu no tienen en cuenta lo que podra costar con la ayuda del seguro (puede ser ms barato con su seguro), pero el sitio web puede darle el precio si no utiliz ningn seguro.  - Puede imprimir el cupn  correspondiente y llevarlo con su receta a la farmacia.  - Tambin puede pasar por nuestra oficina durante el horario de atencin regular y recoger una tarjeta de cupones de GoodRx.  - Si necesita que su receta se enve electrnicamente a una farmacia diferente, informe a nuestra oficina a travs de MyChart de [place] o por telfono llamando al [phone] y presione la opcin 4.   Cryotherapy Aftercare  Wash gently with soap and water everyday.   Apply Vaseline and Band-Aid daily until healed.  

## 2021-08-25 NOTE — Progress Notes (Signed)
   Follow-Up Visit   Subjective  Mitchell [name] is a 65 y.o. male who presents for the following: ISK f/u (Trunk, 66m f/u, some new ones) and check spot  (Scalp, ~31m, irritating) and symptomatic growths of back. He has other spots to be checked.  The following portions of the chart were reviewed this encounter and updated as appropriate:   Tobacco  Allergies  Meds  Problems  Med Hx  Surg Hx  Fam Hx     Review of Systems:  No other skin or systemic complaints except as noted in HPI or Assessment and Plan.  Objective  Well appearing patient in no apparent distress; mood and affect are within normal limits.  A focused examination was performed including trunk, scalp. Relevant physical exam findings are noted in the Assessment and Plan.  Scalp x 1, back x 22, Total = 23 (23) Erythematous keratotic or waxy stuck-on papule or plaque.    Assessment & Plan   Seborrheic Keratoses - Stuck-on, waxy, tan-brown papules and/or plaques  - Benign-appearing - Discussed benign etiology and prognosis. - Observe - Call for any changes  Inflamed seborrheic keratosis Scalp x 1, back x 22, Total = 23  Destruction of lesion - Scalp x 1, back x 22, Total = 23 Complexity: simple   Destruction method: cryotherapy   Informed consent: discussed and consent obtained   Timeout:  patient name, date of birth, surgical site, and procedure verified Lesion destroyed using liquid nitrogen: Yes   Region frozen until ice ball extended beyond lesion: Yes   Outcome: patient tolerated procedure well with no complications   Post-procedure details: wound care instructions given    Actinic Damage - chronic, secondary to cumulative UV radiation exposure/sun exposure over time - diffuse scaly erythematous macules with underlying dyspigmentation - Recommend daily broad spectrum sunscreen SPF 30+ to sun-exposed areas, reapply every 2 hours as needed.  - Recommend staying in the shade or wearing long sleeves, sun  glasses (UVA+UVB protection) and wide brim hats (4-inch brim around the entire circumference of the hat). - Call for new or changing lesions.  Return in about 3 months (around 11/23/2021) for ISK f/u.  I, Othelia Pulling, RMA, am acting as scribe for Sarina Ser, MD . Documentation: I have reviewed the above documentation for accuracy and completeness, and I agree with the above.  Sarina Ser, MD

## 2021-09-02 ENCOUNTER — Other Ambulatory Visit: Payer: Self-pay | Admitting: Family Medicine

## 2021-09-02 NOTE — Telephone Encounter (Signed)
Requested Prescriptions  Pending Prescriptions Disp Refills   levothyroxine (SYNTHROID) 50 MCG tablet [Pharmacy Med Name: Levothyroxine Sodium 50 MCG Oral Tablet] 90 tablet 3    Sig: TAKE 1 TABLET BY MOUTH  DAILY BEFORE BREAKFAST     Endocrinology:  Hypothyroid Agents Failed - 09/02/2021  4:17 AM      Failed - TSH needs to be rechecked within 3 months after an abnormal result. Refill until TSH is due.      Passed - TSH in normal range and within 360 days    TSH  Date Value Ref Range Status  08/09/2021 2.920 0.450 - 4.500 uIU/mL Final         Passed - Valid encounter within last 12 months    Recent Outpatient Visits          3 weeks ago Routine general medical examination at a health care facility   [facility], [place], DO   6 months ago Primary hypertension   [place], Megan P, DO   10 months ago [place], [place], DO   1 year ago Routine general medical examination at a health care facility   [place], [place], DO   1 year ago Muscle cramp   [facility] Valerie Roys, DO      Future Appointments            In 2 months Ralene Bathe, MD [place]   In 4 months  [facility], [place]   In 5 months [name], Barb Merino, DO [employer], [facility]

## 2021-09-03 ENCOUNTER — Encounter: Payer: Self-pay | Admitting: Dermatology

## 2021-09-17 ENCOUNTER — Other Ambulatory Visit: Payer: Self-pay | Admitting: Family Medicine

## 2021-09-17 ENCOUNTER — Telehealth: Payer: Medicare Other

## 2021-09-17 ENCOUNTER — Ambulatory Visit (INDEPENDENT_AMBULATORY_CARE_PROVIDER_SITE_OTHER): Payer: Medicare Other

## 2021-09-17 DIAGNOSIS — M25519 Pain in unspecified shoulder: Secondary | ICD-10-CM

## 2021-09-17 DIAGNOSIS — M25559 Pain in unspecified hip: Secondary | ICD-10-CM

## 2021-09-17 DIAGNOSIS — F331 Major depressive disorder, recurrent, moderate: Secondary | ICD-10-CM

## 2021-09-17 DIAGNOSIS — I10 Essential (primary) hypertension: Secondary | ICD-10-CM

## 2021-09-17 DIAGNOSIS — F419 Anxiety disorder, unspecified: Secondary | ICD-10-CM

## 2021-09-17 DIAGNOSIS — E782 Mixed hyperlipidemia: Secondary | ICD-10-CM

## 2021-09-17 NOTE — Chronic Care Management (AMB) (Signed)
Chronic Care Management   CCM RN Visit Note  09/17/2021 Name: Mitchell [name] MRN: [id number] DOB: Jun 30, 1956  Subjective: Mitchell [name] is a 65 y.o. year old male who is a primary care patient of Valerie Roys, DO. The care management team was consulted for assistance with disease management and care coordination needs.    Engaged with patient by telephone for follow up visit in response to provider referral for case management and/or care coordination services.   Consent to Services:  The patient was given information about Chronic Care Management services, agreed to services, and gave verbal consent prior to initiation of services.  Please see initial visit note for detailed documentation.   Patient agreed to services and verbal consent obtained.   Assessment: Review of patient past medical history, allergies, medications, health status, including review of consultants reports, laboratory and other test data, was performed as part of comprehensive evaluation and provision of chronic care management services.   SDOH (Social Determinants of Health) assessments and interventions performed:    CCM Care Plan  Allergies  Allergen Reactions   Haloperidol Decanoate Other (See Comments)    Muscle pain   Atorvastatin Other (See Comments)    myalgias   Codeine Nausea And Vomiting    Outpatient Encounter Medications as of 09/17/2021  Medication Sig   FLOVENT HFA 220 MCG/ACT inhaler Inhale 2 puffs into the lungs daily.   hydrOXYzine (ATARAX/VISTARIL) 25 MG tablet Take 1 tablet (25 mg total) by mouth 3 (three) times daily as needed for anxiety.   levothyroxine (SYNTHROID) 50 MCG tablet TAKE 1 TABLET BY MOUTH  DAILY BEFORE BREAKFAST   lisinopril (ZESTRIL) 5 MG tablet TAKE 1 AND 1/2 TABLETS BY  MOUTH DAILY   loratadine (CLARITIN) 10 MG tablet Take 10 mg by mouth daily.   Multiple Vitamin (MULTI-VITAMINS) TABS Take by mouth.   rosuvastatin (CRESTOR) 10 MG tablet Take 1 tablet (10 mg total)  by mouth daily.   tadalafil (CIALIS) 20 MG tablet TAKE 1/2 TO 1 TABLET BY MOUTH EVERY OTHER DAY AS NEEDED FOR ERECTILE DYSFUNCTION   No facility-administered encounter medications on file as of 09/17/2021.    Patient Active Problem List   Diagnosis Date Noted   Special screening for malignant neoplasms, colon    Problems with swallowing and mastication    Gastritis    Acromioclavicular joint arthritis 04/13/2016   Swollen testicle 08/14/2015   BPH with obstruction/lower urinary tract symptoms 08/14/2015   Hyperlipidemia 07/22/2015   Hypothyroidism 07/17/2015   Scrotal swelling 07/16/2015   Hydrocele, right 07/08/2015   Drug-induced erectile dysfunction 07/08/2015   IFG (impaired fasting glucose)    Schizophrenia ([place])    Insomnia    Anxiety    Hypertension    Depression    Hepatitis C    Developmental delay 01/02/2013    Conditions to be addressed/monitored:HTN, HLD, Anxiety, Depression, and Osteoarthritis  Care Plan : RNCM: Chronic Pain (Adult)  Updates made by Vanita Ingles, RN since 09/17/2021 12:00 AM  Completed 09/17/2021   Care Plan : RNCM; General Plan of Care (Adult) for Chronic Disease Management and Care Coordination Needs  Updates made by Vanita Ingles, RN since 09/17/2021 12:00 AM     Problem: RNCM: Development of Plan of Care for Chronic Disease Management (HTN, HLD, Anxiety, Depression, OA)   Priority: High     Long-Range Goal: RNCM: Effective Management  of Plan of Care for Chronic Disease Management (HTN, HLD, Anxiety, Depression, OA)   Start Date:  07/16/2021  Expected End Date: 07/16/2022  Priority: High  Note:   Current Barriers:  Knowledge Deficits related to plan of care for management of HTN, HLD, Anxiety with Excessive Worry, and Depression: depressed mood anxiety, and OA  Chronic Disease Management support and education needs related to HTN, HLD, Anxiety with Excessive Worry, and Depression: depressed mood anxiety, and OA  RNCM Clinical  Goal(s):  Patient will verbalize understanding of plan for management of HTN, HLD, Anxiety, Depression, and Osteoarthritis  verbalize basic understanding of HTN, HLD, Anxiety, Depression, and Osteoarthritis disease process and self health management plan  take all medications exactly as prescribed and will call provider for medication related questions demonstrate understanding of rationale for each prescribed medication  attend all scheduled medical appointments: 02-07-2022 demonstrate improved and ongoing adherence to prescribed treatment plan for HTN, HLD, Anxiety, Depression, and Osteoarthritis as evidenced by adherence to prescribed medication regimen contacting provider for new or worsened symptoms or questions  demonstrate a decrease in HTN, HLD, Anxiety, Depression, and Osteoarthritis exacerbations  demonstrate ongoing self health care management ability for effective management of chronic conditions  through collaboration with RN Care manager, provider, and care team.   Interventions: 1:1 collaboration with primary care provider regarding development and update of comprehensive plan of care as evidenced by provider attestation and co-signature Inter-disciplinary care team collaboration (see longitudinal plan of care) Evaluation of current treatment plan related to  self management and patient's adherence to plan as established by provider   SDOH Barriers (Status: Goal on track: YES.) Long Term Goal (>30 days) Patient interviewed and SDOH assessment performed        Patient interviewed and appropriate assessments performed Provided patient with information about resources available to help with changes in SDOH and care guides to assist with new needs  Discussed plans with patient for ongoing care management follow up and provided patient with direct contact information for care management team Advised patient to call the office for changes in [place], questions, or concerns The patient has  the house calls visit with [facility] on 10-05-2021 at 4 pm per the patient     Anxiety and Depression   (Status: Goal on track: YES.) Evaluation of current treatment plan related to Anxiety and Depression, Mental Health Concerns  self-management and patient's adherence to plan as established by provider. 09-17-2021: the patient is doing well with management of his depression and anxiety right now. Denies any acute findings.  Was getting speakers put in his truck. The patient appreciates the calls and check ins from the CCM team.  Discussed plans with patient for ongoing care management follow up and provided patient with direct contact information for care management team Advised patient to call the office for changes in mood, anxiety, or depression ; Provided education to patient re: staying positive, reaching out to others when feeling down, keeping appointments; Reviewed medications with patient and discussed compliance ; Reviewed scheduled/upcoming provider appointments including 09-17-2021 at 345 pm; Discussed plans with patient for ongoing care management follow up and provided patient with direct contact information for care management team; Screening for signs and symptoms of depression related to chronic disease state;  Assessed social determinant of health barriers;   Hyperlipidemia:  (Status: Goal on track: YES.) Lab Results  Component Value Date   CHOL 177 08/09/2021   HDL 42 08/09/2021   LDLCALC 93 08/09/2021   TRIG 249 (H) 08/09/2021     Medication review performed; medication list updated in electronic medical record.  Provider established  cholesterol goals reviewed; Counseled on importance of regular laboratory monitoring as prescribed. 09-17-2021: The patient gets regular lab work; Provided HLD Scientist, clinical (histocompatibility and immunogenetics); Reviewed role and benefits of statin for ASCVD risk reduction; Discussed strategies to manage statin-induced myalgias; Reviewed importance of limiting foods high  in cholesterol. 09-17-2021: Review of heart healthy diet   Hypertension: (Status: Goal on track: YES.) Last practice recorded BP readings:  BP Readings from Last 3 Encounters:  08/09/21 133/81  02/05/21 126/71  10/27/20 128/87  Most recent eGFR/CrCl:  Lab Results  Component Value Date   EGFR 61 08/09/2021    No components found for: CRCL  Evaluation of current treatment plan related to hypertension self management and patient's adherence to plan as established by provider. 09-17-2021: The patient denies any issues with HTN or heart health. Saw the pcp in November and no changes made in the plan of care;   Provided education to patient re: stroke prevention, s/s of heart attack and stroke; Reviewed prescribed diet heart healthy. 09-17-2021: Review of heart healthy diet Reviewed medications with patient and discussed importance of compliance. 09-17-2021: Denies any issues with medications compliance ;  Discussed plans with patient for ongoing care management follow up and provided patient with direct contact information for care management team; Advised patient, providing education and rationale, to monitor blood pressure daily and record, calling PCP for findings outside established parameters;  Provided education on prescribed diet heart healthy;  Discussed complications of poorly controlled blood pressure such as heart disease, stroke, circulatory complications, vision complications, kidney impairment, sexual dysfunction;   Pain:  (Status: Goal on track: YES.) Pain assessment performed. 07-16-2021: The patient states the pain in his hip is now gone after getting the shot. Complains of pain in his right shoulder today, rated pain at 5 or 6. The patient states he will not take anything for it until it gets to an 8. States he has arthritis in both shoulders but right is hurting. 09-17-2021: Denies any pain today. States he is having a good day. Knows to call the provider for changes.   Medications reviewed. 09-17-2021: The patient states he takes an Aleve for pain only if he has to take it. Review and education given.  Reviewed provider established plan for pain management; Discussed importance of adherence to all scheduled medical appointments; Counseled on the importance of reporting any/all new or changed pain symptoms or management strategies to pain management provider; Advised patient to report to care team affect of pain on daily activities; Discussed use of relaxation techniques and/or diversional activities to assist with pain reduction (distraction, imagery, relaxation, massage, acupressure, TENS, heat, and cold application; Reviewed with patient prescribed pharmacological and nonpharmacological pain relief strategies; Advised patient to discuss unresolved pain, changes in level or intensity of pain with provider;  Patient Goals/Self-Care Activities: Patient will self administer medications as prescribed as evidenced by self report/primary caregiver report  Patient will attend all scheduled provider appointments as evidenced by clinician review of documented attendance to scheduled appointments and patient/caregiver report Patient will call pharmacy for medication refills as evidenced by patient report and review of pharmacy fill history as appropriate Patient will attend church or other social activities as evidenced by patient report Patient will continue to perform ADL's independently as evidenced by patient/caregiver report Patient will continue to perform IADL's independently as evidenced by patient/caregiver report Patient will call provider office for new concerns or questions as evidenced by review of documented incoming telephone call notes and patient report Patient will work with  BSW to address care coordination needs and will continue to work with the clinical team to address health care and disease management related needs as evidenced by documented  adherence to scheduled care management/care coordination appointments - check blood pressure weekly - choose a place to take my blood pressure (home, clinic or office, retail store) - write blood pressure results in a log or diary - learn about high blood pressure - keep a blood pressure log - take blood pressure log to all doctor appointments - call doctor for signs and symptoms of high blood pressure - develop an action plan for high blood pressure - keep all doctor appointments - take medications for blood pressure exactly as prescribed - report new symptoms to your doctor - eat more whole grains, fruits and vegetables, lean meats and healthy fats - call for medicine refill 2 or 3 days before it runs out - take all medications exactly as prescribed - call doctor with any symptoms you believe are related to your medicine - call doctor when you experience any new symptoms - go to all doctor appointments as scheduled - adhere to prescribed diet: Heart Healthy       Plan:Telephone follow up appointment with care management team member scheduled for:  11-19-2021 at 345 pm  [place], MSN, [facility] Mobile: [phone]

## 2021-09-17 NOTE — Telephone Encounter (Signed)
Requested Prescriptions  Pending Prescriptions Disp Refills   tadalafil (CIALIS) 20 MG tablet [Pharmacy Med Name: TADALAFIL 20 MG TABLET] 5 tablet 12    Sig: TAKE 1/2 TO 1 TABLET BY MOUTH EVERY OTHER DAY AS NEEDED FOR ERECTILE DYSFUNCTION     Urology: Erectile Dysfunction Agents Passed - 09/17/2021 12:23 PM      Passed - Last BP in normal range    BP Readings from Last 1 Encounters:  08/09/21 133/81         Passed - Valid encounter within last 12 months    Recent Outpatient Visits          1 month ago Routine general medical examination at a health care facility   [facility] [place], [place], [place]   7 months ago Primary hypertension   [facility] [place], Megan P, DO   10 months ago [place], [place], DO   1 year ago Routine general medical examination at a health care facility   [place], [place], DO   1 year ago Muscle cramp   [facility] Valerie Roys, DO      Future Appointments            In 2 months Ralene Bathe, MD [place]   In 4 months  [facility], [place]   In 4 months [name], Barb Merino, [place], [facility]

## 2021-09-17 NOTE — Patient Instructions (Signed)
Visit Information  Thank you for taking time to visit with me today. Please don't hesitate to contact me if I can be of assistance to you before our next scheduled telephone appointment.  Following are the goals we discussed today:  RNCM Clinical Goal(s):  Patient will verbalize understanding of plan for management of HTN, HLD, Anxiety, Depression, and Osteoarthritis  verbalize basic understanding of HTN, HLD, Anxiety, Depression, and Osteoarthritis disease process and self health management plan  take all medications exactly as prescribed and will call provider for medication related questions demonstrate understanding of rationale for each prescribed medication  attend all scheduled medical appointments: 02-07-2022 demonstrate improved and ongoing adherence to prescribed treatment plan for HTN, HLD, Anxiety, Depression, and Osteoarthritis as evidenced by adherence to prescribed medication regimen contacting provider for new or worsened symptoms or questions  demonstrate a decrease in HTN, HLD, Anxiety, Depression, and Osteoarthritis exacerbations  demonstrate ongoing self health care management ability for effective management of chronic conditions  through collaboration with RN Care manager, provider, and care team.    Interventions: 1:1 collaboration with primary care provider regarding development and update of comprehensive plan of care as evidenced by provider attestation and co-signature Inter-disciplinary care team collaboration (see longitudinal plan of care) Evaluation of current treatment plan related to  self management and patient's adherence to plan as established by provider     SDOH Barriers (Status: Goal on track: YES.) Long Term Goal (>30 days) Patient interviewed and SDOH assessment performed        Patient interviewed and appropriate assessments performed Provided patient with information about resources available to help with changes in SDOH and care guides to assist  with new needs  Discussed plans with patient for ongoing care management follow up and provided patient with direct contact information for care management team Advised patient to call the office for changes in [place], questions, or concerns The patient has the house calls visit with [facility] on 10-05-2021 at 4 pm per the patient        Anxiety and Depression   (Status: Goal on track: YES.) Evaluation of current treatment plan related to Anxiety and Depression, Mental Health Concerns  self-management and patient's adherence to plan as established by provider. 09-17-2021: the patient is doing well with management of his depression and anxiety right now. Denies any acute findings.  Was getting speakers put in his truck. The patient appreciates the calls and check ins from the CCM team.  Discussed plans with patient for ongoing care management follow up and provided patient with direct contact information for care management team Advised patient to call the office for changes in mood, anxiety, or depression ; Provided education to patient re: staying positive, reaching out to others when feeling down, keeping appointments; Reviewed medications with patient and discussed compliance ; Reviewed scheduled/upcoming provider appointments including 09-17-2021 at 345 pm; Discussed plans with patient for ongoing care management follow up and provided patient with direct contact information for care management team; Screening for signs and symptoms of depression related to chronic disease state;  Assessed social determinant of health barriers;    Hyperlipidemia:  (Status: Goal on track: YES.)      Lab Results  Component Value Date    CHOL 177 08/09/2021    HDL 42 08/09/2021    LDLCALC 93 08/09/2021    TRIG 249 (H) 08/09/2021      Medication review performed; medication list updated in electronic medical record.  Provider established cholesterol goals reviewed; Counseled on  importance of regular laboratory  monitoring as prescribed. 09-17-2021: The patient gets regular lab work; Provided HLD Scientist, clinical (histocompatibility and immunogenetics); Reviewed role and benefits of statin for ASCVD risk reduction; Discussed strategies to manage statin-induced myalgias; Reviewed importance of limiting foods high in cholesterol. 09-17-2021: Review of heart healthy diet    Hypertension: (Status: Goal on track: YES.) Last practice recorded BP readings:     BP Readings from Last 3 Encounters:  08/09/21 133/81  02/05/21 126/71  10/27/20 128/87  Most recent eGFR/CrCl:       Lab Results  Component Value Date    EGFR 61 08/09/2021    No components found for: CRCL   Evaluation of current treatment plan related to hypertension self management and patient's adherence to plan as established by provider. 09-17-2021: The patient denies any issues with HTN or heart health. Saw the pcp in November and no changes made in the plan of care;   Provided education to patient re: stroke prevention, s/s of heart attack and stroke; Reviewed prescribed diet heart healthy. 09-17-2021: Review of heart healthy diet Reviewed medications with patient and discussed importance of compliance. 09-17-2021: Denies any issues with medications compliance ;  Discussed plans with patient for ongoing care management follow up and provided patient with direct contact information for care management team; Advised patient, providing education and rationale, to monitor blood pressure daily and record, calling PCP for findings outside established parameters;  Provided education on prescribed diet heart healthy;  Discussed complications of poorly controlled blood pressure such as heart disease, stroke, circulatory complications, vision complications, kidney impairment, sexual dysfunction;    Pain:  (Status: Goal on track: YES.) Pain assessment performed. 07-16-2021: The patient states the pain in his hip is now gone after getting the shot. Complains of pain in his right  shoulder today, rated pain at 5 or 6. The patient states he will not take anything for it until it gets to an 8. States he has arthritis in both shoulders but right is hurting. 09-17-2021: Denies any pain today. States he is having a good day. Knows to call the provider for changes.  Medications reviewed. 09-17-2021: The patient states he takes an Aleve for pain only if he has to take it. Review and education given.  Reviewed provider established plan for pain management; Discussed importance of adherence to all scheduled medical appointments; Counseled on the importance of reporting any/all new or changed pain symptoms or management strategies to pain management provider; Advised patient to report to care team affect of pain on daily activities; Discussed use of relaxation techniques and/or diversional activities to assist with pain reduction (distraction, imagery, relaxation, massage, acupressure, TENS, heat, and cold application; Reviewed with patient prescribed pharmacological and nonpharmacological pain relief strategies; Advised patient to discuss unresolved pain, changes in level or intensity of pain with provider;   Patient Goals/Self-Care Activities: Patient will self administer medications as prescribed as evidenced by self report/primary caregiver report  Patient will attend all scheduled provider appointments as evidenced by clinician review of documented attendance to scheduled appointments and patient/caregiver report Patient will call pharmacy for medication refills as evidenced by patient report and review of pharmacy fill history as appropriate Patient will attend church or other social activities as evidenced by patient report Patient will continue to perform ADL's independently as evidenced by patient/caregiver report Patient will continue to perform IADL's independently as evidenced by patient/caregiver report Patient will call provider office for new concerns or questions as  evidenced by review of documented incoming telephone  call notes and patient report Patient will work with BSW to address care coordination needs and will continue to work with the clinical team to address health care and disease management related needs as evidenced by documented adherence to scheduled care management/care coordination appointments - check blood pressure weekly - choose a place to take my blood pressure (home, clinic or office, retail store) - write blood pressure results in a log or diary - learn about high blood pressure - keep a blood pressure log - take blood pressure log to all doctor appointments - call doctor for signs and symptoms of high blood pressure - develop an action plan for high blood pressure - keep all doctor appointments - take medications for blood pressure exactly as prescribed - report new symptoms to your doctor - eat more whole grains, fruits and vegetables, lean meats and healthy fats - call for medicine refill 2 or 3 days before it runs out - take all medications exactly as prescribed - call doctor with any symptoms you believe are related to your medicine - call doctor when you experience any new symptoms - go to all doctor appointments as scheduled - adhere to prescribed diet: Heart Healthy          Our next appointment is by telephone on 11-19-2021 at 345 pm   Please call the care guide team at [phone] if you need to cancel or reschedule your appointment.   If you are experiencing a Mental Health or [place] or need someone to talk to, please call the Suicide and Crisis Lifeline: 988 call the [place] National Suicide Prevention Lifeline: (484) 427-6690 or TTY: 508 650 6729 TTY 463-881-2812) to talk to a trained counselor call 1-800-273-TALK (toll free, 24 hour hotline)   The patient verbalized understanding of instructions, educational materials, and care plan provided today and declined offer to receive copy of patient  instructions, educational materials, and care plan.   [name] RN, MSN, [facility] Mobile: [phone]

## 2021-09-18 DIAGNOSIS — F331 Major depressive disorder, recurrent, moderate: Secondary | ICD-10-CM | POA: Diagnosis not present

## 2021-09-18 DIAGNOSIS — I10 Essential (primary) hypertension: Secondary | ICD-10-CM | POA: Diagnosis not present

## 2021-09-18 DIAGNOSIS — E782 Mixed hyperlipidemia: Secondary | ICD-10-CM

## 2021-10-05 ENCOUNTER — Telehealth: Payer: Self-pay | Admitting: Family Medicine

## 2021-10-05 NOTE — Telephone Encounter (Signed)
Attempted to contact patient, no answer unable to LVM. Will try again.

## 2021-10-05 NOTE — Telephone Encounter (Signed)
Patient has requested provider or clinical staff to call him regarding his medication instructions for hydroxyzine 25MG .  Pt wants to know if he can take more than 1 tablet at bedtime (pt wants to take 2 tablets at once, instead of 1).  Please call patient at [phone] asap.

## 2021-10-05 NOTE — Telephone Encounter (Signed)
Patient called back, gave Jolene's advise patient verbalized understanding.

## 2021-10-24 ENCOUNTER — Other Ambulatory Visit: Payer: Self-pay | Admitting: Family Medicine

## 2021-10-25 NOTE — Telephone Encounter (Signed)
Requested Prescriptions  Pending Prescriptions Disp Refills   tadalafil (CIALIS) 20 MG tablet [Pharmacy Med Name: TADALAFIL 20 MG TABLET] 5 tablet 2    Sig: TAKE 1/2 TO 1 TABLET BY MOUTH EVERY OTHER DAY AS NEEDED FOR ERECTILE DYSFUNCTION     Urology: Erectile Dysfunction Agents Passed - 10/24/2021  4:27 PM      Passed - AST in normal range and within 360 days    AST  Date Value Ref Range Status  08/09/2021 31 0 - 40 IU/L Final   SGOT(AST)  Date Value Ref Range Status  09/07/2012 42 (H) 15 - 37 Unit/L Final   AST (SGOT) Piccolo, Waived  Date Value Ref Range Status  08/27/2015 35 11 - 38 U/L Final         Passed - ALT in normal range and within 360 days    ALT  Date Value Ref Range Status  08/09/2021 24 0 - 44 IU/L Final   SGPT (ALT)  Date Value Ref Range Status  09/07/2012 58 12 - 78 U/L Final   ALT (SGPT) Piccolo, Waived  Date Value Ref Range Status  08/27/2015 26 10 - 47 U/L Final         Passed - Last BP in normal range    BP Readings from Last 1 Encounters:  08/09/21 133/81         Passed - Valid encounter within last 12 months    Recent Outpatient Visits          2 months ago Routine general medical examination at a health care facility   [facility], [place], DO   8 months ago Primary hypertension   [place], [place], DO   12 months ago Kimberly, [place], DO   1 year ago Routine general medical examination at a health care facility   [place], [place], DO   1 year ago Muscle cramp   [facility] Valerie Roys, DO      Future Appointments            In 1 month Ralene Bathe, MD [place]   In 3 months  [facility], [place]   In 3 months [name], Barb Merino, DO [employer], [place]

## 2021-11-19 ENCOUNTER — Ambulatory Visit (INDEPENDENT_AMBULATORY_CARE_PROVIDER_SITE_OTHER): Payer: Medicare Other

## 2021-11-19 ENCOUNTER — Telehealth: Payer: Medicare Other

## 2021-11-19 DIAGNOSIS — F331 Major depressive disorder, recurrent, moderate: Secondary | ICD-10-CM

## 2021-11-19 DIAGNOSIS — F419 Anxiety disorder, unspecified: Secondary | ICD-10-CM

## 2021-11-19 DIAGNOSIS — I10 Essential (primary) hypertension: Secondary | ICD-10-CM

## 2021-11-19 DIAGNOSIS — G8929 Other chronic pain: Secondary | ICD-10-CM

## 2021-11-19 DIAGNOSIS — E782 Mixed hyperlipidemia: Secondary | ICD-10-CM

## 2021-11-19 NOTE — Patient Instructions (Signed)
Visit Information  Thank you for taking time to visit with me today. Please don't hesitate to contact me if I can be of assistance to you before our next scheduled telephone appointment.  Following are the goals we discussed today:  RNCM Clinical Goal(s):  Patient will verbalize understanding of plan for management of HTN, HLD, Anxiety, Depression, and Osteoarthritis  verbalize basic understanding of HTN, HLD, Anxiety, Depression, and Osteoarthritis disease process and self health management plan  take all medications exactly as prescribed and will call provider for medication related questions demonstrate understanding of rationale for each prescribed medication  attend all scheduled medical appointments: 02-07-2022 demonstrate improved and ongoing adherence to prescribed treatment plan for HTN, HLD, Anxiety, Depression, and Osteoarthritis as evidenced by adherence to prescribed medication regimen contacting provider for new or worsened symptoms or questions  demonstrate a decrease in HTN, HLD, Anxiety, Depression, and Osteoarthritis exacerbations  demonstrate ongoing self health care management ability for effective management of chronic conditions  through collaboration with RN Care manager, provider, and care team.    Interventions: 1:1 collaboration with primary care provider regarding development and update of comprehensive plan of care as evidenced by provider attestation and co-signature Inter-disciplinary care team collaboration (see longitudinal plan of care) Evaluation of current treatment plan related to  self management and patient's adherence to plan as established by provider     SDOH Barriers (Status: Goal on track: YES.) Long Term Goal (>30 days) Patient interviewed and SDOH assessment performed        Patient interviewed and appropriate assessments performed Provided patient with information about resources available to help with changes in SDOH and care guides to assist  with new needs  Discussed plans with patient for ongoing care management follow up and provided patient with direct contact information for care management team Advised patient to call the office for changes in [place], questions, or concerns The patient has the house calls visit with [facility] on 10-05-2021 at 4 pm per the patient        Anxiety and Depression   (Status: Goal on track: YES.) Evaluation of current treatment plan related to Anxiety and Depression, Mental Health Concerns  self-management and patient's adherence to plan as established by provider. 09-17-2021: the patient is doing well with management of his depression and anxiety right now. Denies any acute findings.  Was getting speakers put in his truck. The patient appreciates the calls and check ins from the CCM team. 11-19-2021: The patient is doing well and denies any acute changes. 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 He says he is not going to sit there and dry up. He likes to stay busy. States him and his wife just ate and are likely going to go out for the weekend. The patient denies any acute findings today.  Discussed plans with patient for ongoing care management follow up and provided patient with direct contact information for care management team Advised patient to call the office for changes in mood, anxiety, or depression ; Provided education to patient re: staying positive, reaching out to others when feeling down, keeping appointments; Reviewed medications with patient and discussed compliance ; Reviewed scheduled/upcoming provider appointments including 02-07-2022 at 340 pm; Discussed plans with patient for ongoing care management follow up and provided patient with direct contact information for care management team; Screening for signs and symptoms of depression related to chronic disease state;  Assessed social determinant of health barriers;  Hyperlipidemia:  (Status: Goal  on track: YES.)      Lab Results  Component Value Date    CHOL 177 08/09/2021    HDL 42 08/09/2021    LDLCALC 93 08/09/2021    TRIG 249 (H) 08/09/2021      Medication review performed; medication list updated in electronic medical record. 11-19-2021: The patient takes Crestor 10 mg daily.  Provider established cholesterol goals reviewed; Counseled on importance of regular laboratory monitoring as prescribed. 11-19-2021: The patient gets regular lab work; Provided HLD Scientist, clinical (histocompatibility and immunogenetics); Reviewed role and benefits of statin for ASCVD risk reduction; Discussed strategies to manage statin-induced myalgias; Reviewed importance of limiting foods high in cholesterol. 11-19-2021: Review of heart healthy diet    Hypertension: (Status: Goal on track: YES.) Last practice recorded BP readings:     BP Readings from Last 3 Encounters:  08/09/21 133/81  02/05/21 126/71  10/27/20 128/87  Most recent eGFR/CrCl:       Lab Results  Component Value Date    EGFR 61 08/09/2021    No components found for: CRCL   Evaluation of current treatment plan related to hypertension self management and patient's adherence to plan as established by provider. 09-17-2021: The patient denies any issues with HTN or heart health. Saw the pcp in November and no changes made in the plan of care. 11-19-2021: The patient states he is doing well. Denies any issues with HTN or hearth health. Will continue to monitor;   Provided education to patient re: stroke prevention, s/s of heart attack and stroke; Reviewed prescribed diet heart healthy. 11-19-2021: Review of heart healthy diet Reviewed medications with patient and discussed importance of compliance. 11-19-2021: Denies any issues with medications compliance ;  Discussed plans with patient for ongoing care management follow up and provided patient with direct contact information for care management team; Advised patient, providing education and rationale, to monitor blood  pressure daily and record, calling PCP for findings outside established parameters;  Provided education on prescribed diet heart healthy;  Discussed complications of poorly controlled blood pressure such as heart disease, stroke, circulatory complications, vision complications, kidney impairment, sexual dysfunction;    Pain:  (Status: Goal on track: YES.) Pain assessment performed. 07-16-2021: The patient states the pain in his hip is now gone after getting the shot. Complains of pain in his right shoulder today, rated pain at 5 or 6. The patient states he will not take anything for it until it gets to an 8. States he has arthritis in both shoulders but right is hurting. 09-17-2021: Denies any pain today. States he is having a good day. Knows to call the provider for changes. 11-19-2021: The patient rates his shoulder pain at a 6 today on a scale of 0-10. 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 The patient states he takes Aleve and that helps a lot.  Medications reviewed. 11-19-2021: The patient states he takes an Aleve for pain only if he has to take it. Review and education given.  Reviewed provider established plan for pain management; Discussed importance of adherence to all scheduled medical appointments. 02-07-2022 at 340 pm; Counseled on the importance of reporting any/all new or changed pain symptoms or management strategies to pain management provider; Advised patient to report to care team affect of pain on daily activities; Discussed use of relaxation techniques and/or diversional activities to assist with pain reduction (  distraction, imagery, relaxation, massage, acupressure, TENS, heat, and cold application; Reviewed with patient prescribed pharmacological and nonpharmacological pain relief strategies; Advised patient to discuss unresolved pain, changes in level or intensity of pain with provider;    Patient Goals/Self-Care Activities: Patient will self administer medications as prescribed as evidenced by self report/primary caregiver report  Patient will attend all scheduled provider appointments as evidenced by clinician review of documented attendance to scheduled appointments and patient/caregiver report Patient will call pharmacy for medication refills as evidenced by patient report and review of pharmacy fill history as appropriate Patient will attend church or other social activities as evidenced by patient report Patient will continue to perform ADL's independently as evidenced by patient/caregiver report Patient will continue to perform IADL's independently as evidenced by patient/caregiver report Patient will call provider office for new concerns or questions as evidenced by review of documented incoming telephone call notes and patient report Patient will work with BSW to address care coordination needs and will continue to work with the clinical team to address health care and disease management related needs as evidenced by documented adherence to scheduled care management/care coordination appointments - check blood pressure weekly - choose a place to take my blood pressure (home, clinic or office, retail store) - write blood pressure results in a log or diary - learn about high blood pressure - keep a blood pressure log - take blood pressure log to all doctor appointments - call doctor for signs and symptoms of high blood pressure - develop an action plan for high blood pressure - keep all doctor appointments - take medications for blood pressure exactly as prescribed - report new symptoms to your doctor - eat more whole grains, fruits and vegetables, lean meats and healthy fats - call for medicine refill 2 or 3 days before it runs out - take all medications exactly as prescribed - call doctor with any symptoms you believe are related to your medicine - call doctor when  you experience any new symptoms - go to all doctor appointments as scheduled - adhere to prescribed diet: Heart Healthy      Our next appointment is by telephone on 01-21-2022 at 345 pm  Please call the care guide team at [phone] if you need to cancel or reschedule your appointment.   If you are experiencing a Mental Health or [place] or need someone to talk to, please call the Suicide and Crisis Lifeline: 988 call the [place] National Suicide Prevention Lifeline: 279-812-7993 or TTY: 407-021-3565 TTY 574-274-8801) to talk to a trained counselor call 1-800-273-TALK (toll free, 24 hour hotline)   The patient verbalized understanding of instructions, educational materials, and care plan provided today and declined offer to receive copy of patient instructions, educational materials, and care plan.   [name] RN, MSN, [facility] Mobile: [phone]

## 2021-11-19 NOTE — Chronic Care Management (AMB) (Signed)
Chronic Care Management   CCM RN Visit Note  11/19/2021 Name: Mitchell [name] MRN: [id number] DOB: 1956/08/11  Subjective: Mitchell [name] is a 66 y.o. year old male who is a primary care patient of Valerie Roys, DO. The care management team was consulted for assistance with disease management and care coordination needs.    Engaged with patient by telephone for follow up visit in response to provider referral for case management and/or care coordination services.   Consent to Services:  The patient was given information about Chronic Care Management services, agreed to services, and gave verbal consent prior to initiation of services.  Please see initial visit note for detailed documentation.   Patient agreed to services and verbal consent obtained.   Assessment: Review of patient past medical history, allergies, medications, health status, including review of consultants reports, laboratory and other test data, was performed as part of comprehensive evaluation and provision of chronic care management services.   SDOH (Social Determinants of Health) assessments and interventions performed:    CCM Care Plan  Allergies  Allergen Reactions   Haloperidol Decanoate Other (See Comments)    Muscle pain   Atorvastatin Other (See Comments)    myalgias   Codeine Nausea And Vomiting    Outpatient Encounter Medications as of 11/19/2021  Medication Sig   FLOVENT HFA 220 MCG/ACT inhaler Inhale 2 puffs into the lungs daily.   hydrOXYzine (ATARAX/VISTARIL) 25 MG tablet Take 1 tablet (25 mg total) by mouth 3 (three) times daily as needed for anxiety.   levothyroxine (SYNTHROID) 50 MCG tablet TAKE 1 TABLET BY MOUTH  DAILY BEFORE BREAKFAST   lisinopril (ZESTRIL) 5 MG tablet TAKE 1 AND 1/2 TABLETS BY  MOUTH DAILY   loratadine (CLARITIN) 10 MG tablet Take 10 mg by mouth daily.   Multiple Vitamin (MULTI-VITAMINS) TABS Take by mouth.   rosuvastatin (CRESTOR) 10 MG tablet Take 1 tablet (10 mg total) by  mouth daily.   tadalafil (CIALIS) 20 MG tablet TAKE 1/2 TO 1 TABLET BY MOUTH EVERY OTHER DAY AS NEEDED FOR ERECTILE DYSFUNCTION   No facility-administered encounter medications on file as of 11/19/2021.    Patient Active Problem List   Diagnosis Date Noted   Special screening for malignant neoplasms, colon    Problems with swallowing and mastication    Gastritis    Acromioclavicular joint arthritis 04/13/2016   Swollen testicle 08/14/2015   BPH with obstruction/lower urinary tract symptoms 08/14/2015   Hyperlipidemia 07/22/2015   Hypothyroidism 07/17/2015   Scrotal swelling 07/16/2015   Hydrocele, right 07/08/2015   Drug-induced erectile dysfunction 07/08/2015   IFG (impaired fasting glucose)    Schizophrenia (HCC)    Insomnia    Anxiety    Hypertension    Depression    Hepatitis C    Developmental delay 01/02/2013    Conditions to be addressed/monitored:HTN, HLD, Anxiety, Depression, and Osteoarthritis  Care Plan : RNCM; General Plan of Care (Adult) for Chronic Disease Management and Care Coordination Needs  Updates made by Vanita Ingles, RN since 11/19/2021 12:00 AM     Problem: RNCM: Development of Plan of Care for Chronic Disease Management (HTN, HLD, Anxiety, Depression, OA)   Priority: High     Long-Range Goal: RNCM: Effective Management  of Plan of Care for Chronic Disease Management (HTN, HLD, Anxiety, Depression, OA)   Start Date: 07/16/2021  Expected End Date: 07/16/2022  Priority: High  Note:   Current Barriers:  Knowledge Deficits related to plan of care for  management of HTN, HLD, Anxiety with Excessive Worry, and Depression: depressed mood anxiety, and OA  Chronic Disease Management support and education needs related to HTN, HLD, Anxiety with Excessive Worry, and Depression: depressed mood anxiety, and OA  RNCM Clinical Goal(s):  Patient will verbalize understanding of plan for management of HTN, HLD, Anxiety, Depression, and Osteoarthritis  verbalize  basic understanding of HTN, HLD, Anxiety, Depression, and Osteoarthritis disease process and self health management plan  take all medications exactly as prescribed and will call provider for medication related questions demonstrate understanding of rationale for each prescribed medication  attend all scheduled medical appointments: 02-07-2022 demonstrate improved and ongoing adherence to prescribed treatment plan for HTN, HLD, Anxiety, Depression, and Osteoarthritis as evidenced by adherence to prescribed medication regimen contacting provider for new or worsened symptoms or questions  demonstrate a decrease in HTN, HLD, Anxiety, Depression, and Osteoarthritis exacerbations  demonstrate ongoing self health care management ability for effective management of chronic conditions  through collaboration with RN Care manager, provider, and care team.   Interventions: 1:1 collaboration with primary care provider regarding development and update of comprehensive plan of care as evidenced by provider attestation and co-signature Inter-disciplinary care team collaboration (see longitudinal plan of care) Evaluation of current treatment plan related to  self management and patient's adherence to plan as established by provider   SDOH Barriers (Status: Goal on track: YES.) Long Term Goal (>30 days) Patient interviewed and SDOH assessment performed        Patient interviewed and appropriate assessments performed Provided patient with information about resources available to help with changes in SDOH and care guides to assist with new needs  Discussed plans with patient for ongoing care management follow up and provided patient with direct contact information for care management team Advised patient to call the office for changes in [place], questions, or concerns The patient has the house calls visit with [facility] on 10-05-2021 at 4 pm per the patient     Anxiety and Depression   (Status: Goal on track:  YES.) Evaluation of current treatment plan related to Anxiety and Depression, Mental Health Concerns  self-management and patient's adherence to plan as established by provider. 09-17-2021: the patient is doing well with management of his depression and anxiety right now. Denies any acute findings.  Was getting speakers put in his truck. The patient appreciates the calls and check ins from the CCM team. 11-19-2021: The patient is doing well and denies any acute changes. 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 He says he is not going to sit there and dry up. He likes to stay busy. States him and his wife just ate and are likely going to go out for the weekend. The patient denies any acute findings today.  Discussed plans with patient for ongoing care management follow up and provided patient with direct contact information for care management team Advised patient to call the office for changes in mood, anxiety, or depression ; Provided education to patient re: staying positive, reaching out to others when feeling down, keeping appointments; Reviewed medications with patient and discussed compliance ; Reviewed scheduled/upcoming provider appointments including 02-07-2022 at 340 pm; Discussed plans with patient for ongoing care management follow up and provided patient with direct contact information for care management team; Screening for signs and symptoms of depression related to chronic disease state;  Assessed social determinant of health barriers;   Hyperlipidemia:  (Status: Goal on track:  YES.) Lab Results  Component Value Date   CHOL 177 08/09/2021   HDL 42 08/09/2021   LDLCALC 93 08/09/2021   TRIG 249 (H) 08/09/2021     Medication review performed; medication list updated in electronic medical record. 11-19-2021: The patient takes Crestor 10 mg daily.  Provider established cholesterol goals reviewed; Counseled on importance of regular laboratory  monitoring as prescribed. 11-19-2021: The patient gets regular lab work; Provided HLD Scientist, clinical (histocompatibility and immunogenetics); Reviewed role and benefits of statin for ASCVD risk reduction; Discussed strategies to manage statin-induced myalgias; Reviewed importance of limiting foods high in cholesterol. 11-19-2021: Review of heart healthy diet   Hypertension: (Status: Goal on track: YES.) Last practice recorded BP readings:  BP Readings from Last 3 Encounters:  08/09/21 133/81  02/05/21 126/71  10/27/20 128/87  Most recent eGFR/CrCl:  Lab Results  Component Value Date   EGFR 61 08/09/2021    No components found for: CRCL  Evaluation of current treatment plan related to hypertension self management and patient's adherence to plan as established by provider. 09-17-2021: The patient denies any issues with HTN or heart health. Saw the pcp in November and no changes made in the plan of care. 11-19-2021: The patient states he is doing well. Denies any issues with HTN or hearth health. Will continue to monitor;   Provided education to patient re: stroke prevention, s/s of heart attack and stroke; Reviewed prescribed diet heart healthy. 11-19-2021: Review of heart healthy diet Reviewed medications with patient and discussed importance of compliance. 11-19-2021: Denies any issues with medications compliance ;  Discussed plans with patient for ongoing care management follow up and provided patient with direct contact information for care management team; Advised patient, providing education and rationale, to monitor blood pressure daily and record, calling PCP for findings outside established parameters;  Provided education on prescribed diet heart healthy;  Discussed complications of poorly controlled blood pressure such as heart disease, stroke, circulatory complications, vision complications, kidney impairment, sexual dysfunction;   Pain:  (Status: Goal on track: YES.) Pain assessment performed. 07-16-2021: The patient  states the pain in his hip is now gone after getting the shot. Complains of pain in his right shoulder today, rated pain at 5 or 6. The patient states he will not take anything for it until it gets to an 8. States he has arthritis in both shoulders but right is hurting. 09-17-2021: Denies any pain today. States he is having a good day. Knows to call the provider for changes. 11-19-2021: The patient rates his shoulder pain at a 6 today on a scale of 0-10. 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 The patient states he takes Aleve and that helps a lot.  Medications reviewed. 11-19-2021: The patient states he takes an Aleve for pain only if he has to take it. Review and education given.  Reviewed provider established plan for pain management; Discussed importance of adherence to all scheduled medical appointments. 02-07-2022 at 340 pm; Counseled on the importance of reporting any/all new or changed pain symptoms or management strategies to pain management provider; Advised patient to report to care team affect of pain on daily activities; Discussed use of relaxation techniques and/or diversional activities to assist with pain reduction (distraction, imagery, relaxation, massage, acupressure, TENS, heat, and cold application; Reviewed with patient prescribed pharmacological and nonpharmacological pain relief strategies; Advised patient to discuss unresolved pain, changes in  level or intensity of pain with provider;  Patient Goals/Self-Care Activities: Patient will self administer medications as prescribed as evidenced by self report/primary caregiver report  Patient will attend all scheduled provider appointments as evidenced by clinician review of documented attendance to scheduled appointments and patient/caregiver report Patient will call pharmacy for medication refills as evidenced by patient report and review of  pharmacy fill history as appropriate Patient will attend church or other social activities as evidenced by patient report Patient will continue to perform ADL's independently as evidenced by patient/caregiver report Patient will continue to perform IADL's independently as evidenced by patient/caregiver report Patient will call provider office for new concerns or questions as evidenced by review of documented incoming telephone call notes and patient report Patient will work with BSW to address care coordination needs and will continue to work with the clinical team to address health care and disease management related needs as evidenced by documented adherence to scheduled care management/care coordination appointments - check blood pressure weekly - choose a place to take my blood pressure (home, clinic or office, retail store) - write blood pressure results in a log or diary - learn about high blood pressure - keep a blood pressure log - take blood pressure log to all doctor appointments - call doctor for signs and symptoms of high blood pressure - develop an action plan for high blood pressure - keep all doctor appointments - take medications for blood pressure exactly as prescribed - report new symptoms to your doctor - eat more whole grains, fruits and vegetables, lean meats and healthy fats - call for medicine refill 2 or 3 days before it runs out - take all medications exactly as prescribed - call doctor with any symptoms you believe are related to your medicine - call doctor when you experience any new symptoms - go to all doctor appointments as scheduled - adhere to prescribed diet: Heart Healthy       Plan:Telephone follow up appointment with care management team member scheduled for:  01-21-2022 at 345 pm  [place], MSN, [facility] Mobile: [phone]

## 2021-11-24 ENCOUNTER — Ambulatory Visit: Payer: Medicare Other | Admitting: Dermatology

## 2021-11-24 ENCOUNTER — Other Ambulatory Visit: Payer: Self-pay

## 2021-11-24 DIAGNOSIS — L820 Inflamed seborrheic keratosis: Secondary | ICD-10-CM

## 2021-11-24 DIAGNOSIS — L570 Actinic keratosis: Secondary | ICD-10-CM | POA: Diagnosis not present

## 2021-11-24 DIAGNOSIS — D229 Melanocytic nevi, unspecified: Secondary | ICD-10-CM | POA: Diagnosis not present

## 2021-11-24 DIAGNOSIS — L821 Other seborrheic keratosis: Secondary | ICD-10-CM | POA: Diagnosis not present

## 2021-11-24 DIAGNOSIS — L578 Other skin changes due to chronic exposure to nonionizing radiation: Secondary | ICD-10-CM | POA: Diagnosis not present

## 2021-11-24 DIAGNOSIS — L814 Other melanin hyperpigmentation: Secondary | ICD-10-CM

## 2021-11-24 DIAGNOSIS — Z1283 Encounter for screening for malignant neoplasm of skin: Secondary | ICD-10-CM

## 2021-11-24 DIAGNOSIS — D1800 Hemangioma unspecified site: Secondary | ICD-10-CM

## 2021-11-24 NOTE — Patient Instructions (Signed)

## 2021-11-24 NOTE — Progress Notes (Signed)
Follow-Up Visit   Subjective  Mitchell [name] is a 66 y.o. male who presents for the following: ISK's  (On the trunk and scalp - previously tx with LN2 pt here for additional treatment). The patient presents for Total-Body Skin Exam (TBSE) for skin cancer screening and mole check.  The patient has spots, moles and lesions to be evaluated, some may be new or changing and the patient has concerns that these could be cancer.  The following portions of the chart were reviewed this encounter and updated as appropriate:   Tobacco  Allergies  Meds  Problems  Med Hx  Surg Hx  Fam Hx     Review of Systems:  No other skin or systemic complaints except as noted in HPI or Assessment and Plan.  Objective  Well appearing patient in no apparent distress; mood and affect are within normal limits.  A full examination was performed including scalp, head, eyes, ears, nose, lips, neck, chest, axillae, abdomen, back, buttocks, bilateral upper extremities, bilateral lower extremities, hands, feet, fingers, toes, fingernails, and toenails. All findings within normal limits unless otherwise noted below.  R temple x 1 Erythematous thin papules/macules with gritty scale.   Back x 32 (32) Erythematous stuck-on, waxy papule or plaque   Assessment & Plan  AK (actinic keratosis) R temple x 1  Destruction of lesion - R temple x 1 Complexity: simple   Destruction method: cryotherapy   Informed consent: discussed and consent obtained   Timeout:  patient name, date of birth, surgical site, and procedure verified Lesion destroyed using liquid nitrogen: Yes   Region frozen until ice ball extended beyond lesion: Yes   Outcome: patient tolerated procedure well with no complications   Post-procedure details: wound care instructions given    Inflamed seborrheic keratosis (32) Back x 32  Destruction of lesion - Back x 32 Complexity: simple   Destruction method: cryotherapy   Informed consent: discussed and  consent obtained   Timeout:  patient name, date of birth, surgical site, and procedure verified Lesion destroyed using liquid nitrogen: Yes   Region frozen until ice ball extended beyond lesion: Yes   Outcome: patient tolerated procedure well with no complications   Post-procedure details: wound care instructions given    Lentigines - Scattered tan macules - Due to sun exposure - Benign-appearing, observe - Recommend daily broad spectrum sunscreen SPF 30+ to sun-exposed areas, reapply every 2 hours as needed. - Call for any changes  Seborrheic Keratoses - Stuck-on, waxy, tan-brown papules and/or plaques  - Benign-appearing - Discussed benign etiology and prognosis. - Observe - Call for any changes  Melanocytic Nevi - Tan-brown and/or pink-flesh-colored symmetric macules and papules - Benign appearing on exam today - Observation - Call clinic for new or changing moles - Recommend daily use of broad spectrum spf 30+ sunscreen to sun-exposed areas.   Hemangiomas - Red papules - Discussed benign nature - Observe - Call for any changes  Actinic Damage - Chronic condition, secondary to cumulative UV/sun exposure - diffuse scaly erythematous macules with underlying dyspigmentation - Recommend daily broad spectrum sunscreen SPF 30+ to sun-exposed areas, reapply every 2 hours as needed.  - Staying in the shade or wearing long sleeves, sun glasses (UVA+UVB protection) and wide brim hats (4-inch brim around the entire circumference of the hat) are also recommended for sun protection.  - Call for new or changing lesions.  Return in about 3 months (around 02/24/2022) for ISK follow up .  Maylene Roes,  CMA, am acting as scribe for Armida Sans, MD . Documentation: I have reviewed the above documentation for accuracy and completeness, and I agree with the above.  Armida Sans, MD

## 2021-11-30 ENCOUNTER — Encounter: Payer: Self-pay | Admitting: Dermatology

## 2021-12-03 ENCOUNTER — Other Ambulatory Visit: Payer: Self-pay | Admitting: Family Medicine

## 2021-12-03 NOTE — Telephone Encounter (Signed)
Requested Prescriptions  ?Pending Prescriptions Disp Refills  ?? tadalafil (CIALIS) 20 MG tablet [Pharmacy Med Name: TADALAFIL 20 MG TABLET] 5 tablet 3  ?  Sig: TAKE 1/2 TO 1 TABLET BY MOUTH EVERY OTHER DAY AS NEEDED FOR ERECTILE DYSFUNCTION  ?  ? Urology: Erectile Dysfunction Agents Passed - 12/03/2021  2:37 PM  ?  ?  Passed - AST in normal range and within 360 days  ?  AST  ?Date Value Ref Range Status  ?08/09/2021 31 0 - 40 IU/L Final  ? ?SGOT(AST)  ?Date Value Ref Range Status  ?09/07/2012 42 (H) 15 - 37 Unit/L Final  ? ?AST (SGOT) Piccolo, Waived  ?Date Value Ref Range Status  ?08/27/2015 35 11 - 38 U/L Final  ?   ?  ?  Passed - ALT in normal range and within 360 days  ?  ALT  ?Date Value Ref Range Status  ?08/09/2021 24 0 - 44 IU/L Final  ? ?SGPT (ALT)  ?Date Value Ref Range Status  ?09/07/2012 58 12 - 78 U/L Final  ? ?ALT (SGPT) Piccolo, Waived  ?Date Value Ref Range Status  ?08/27/2015 26 10 - 47 U/L Final  ?   ?  ?  Passed - Last BP in normal range  ?  BP Readings from Last 1 Encounters:  ?08/09/21 133/81  ?   ?  ?  Passed - Valid encounter within last 12 months  ?  Recent Outpatient Visits   ?      ? 3 months ago Routine general medical examination at a health care facility  ? [place], Megan P, DO  ? 10 months ago Primary hypertension  ? [place], Megan P, DO  ? 1 year ago COVID-19  ? [place], DO  ? 1 year ago Routine general medical examination at a health care facility  ? [place], Megan P, DO  ? 1 year ago Muscle cramp  ? [place], [place] P, DO  ?  ?  ?Future Appointments   ?        ? In 1 month  [place], [facility]  ? In 2 months [name], Barb Merino, DO [place], [facility]  ? In 2 months Ralene Bathe, MD [place]  ?  ? ?  ?  ?  ? ? ?

## 2021-12-09 ENCOUNTER — Other Ambulatory Visit: Payer: Self-pay | Admitting: Family Medicine

## 2021-12-09 DIAGNOSIS — I10 Essential (primary) hypertension: Secondary | ICD-10-CM

## 2021-12-10 NOTE — Telephone Encounter (Signed)
Requested Prescriptions  ?Pending Prescriptions Disp Refills  ?? lisinopril (ZESTRIL) 5 MG tablet [Pharmacy Med Name: Lisinopril 5 MG Oral Tablet] 135 tablet 0  ?  Sig: TAKE 1 AND 1/2 TABLETS BY MOUTH  DAILY  ?  ? Cardiovascular:  ACE Inhibitors Failed - 12/09/2021  4:42 AM  ?  ?  Failed - Cr in normal range and within 180 days  ?  Creatinine  ?Date Value Ref Range Status  ?09/07/2012 1.20 0.60 - 1.30 mg/dL Final  ? ?Creatinine, Ser  ?Date Value Ref Range Status  ?08/09/2021 1.29 (H) 0.76 - 1.27 mg/dL Final  ?   ?  ?  Passed - K in normal range and within 180 days  ?  Potassium  ?Date Value Ref Range Status  ?08/09/2021 4.6 3.5 - 5.2 mmol/L Final  ?09/07/2012 3.7 3.5 - 5.1 mmol/L Final  ?   ?  ?  Passed - Patient is not pregnant  ?  ?  Passed - Last BP in normal range  ?  BP Readings from Last 1 Encounters:  ?08/09/21 133/81  ?   ?  ?  Passed - Valid encounter within last 6 months  ?  Recent Outpatient Visits   ?      ? 4 months ago Routine general medical examination at a health care facility  ? [place], Megan P, DO  ? 10 months ago Primary hypertension  ? [place], Megan P, DO  ? 1 year ago COVID-19  ? [place], DO  ? 1 year ago Routine general medical examination at a health care facility  ? [place], Megan P, DO  ? 1 year ago Muscle cramp  ? [place], [place] P, DO  ?  ?  ?Future Appointments   ?        ? In 1 month  [place], [facility]  ? In 1 month [name], Barb Merino, DO [employer], [facility]  ? In 2 months Ralene Bathe, MD [place]  ?  ? ?  ?  ?  ? ?

## 2021-12-17 DIAGNOSIS — I10 Essential (primary) hypertension: Secondary | ICD-10-CM | POA: Diagnosis not present

## 2021-12-17 DIAGNOSIS — E785 Hyperlipidemia, unspecified: Secondary | ICD-10-CM

## 2021-12-17 DIAGNOSIS — F32A Depression, unspecified: Secondary | ICD-10-CM | POA: Diagnosis not present

## 2022-01-20 ENCOUNTER — Other Ambulatory Visit: Payer: Self-pay | Admitting: Family Medicine

## 2022-01-20 NOTE — Telephone Encounter (Signed)
Requested Prescriptions  ?Pending Prescriptions Disp Refills  ?? tadalafil (CIALIS) 20 MG tablet [Pharmacy Med Name: TADALAFIL 20 MG TABLET] 5 tablet 3  ?  Sig: TAKE 1/2 TO 1 TABLET BY MOUTH EVERY OTHER DAY AS NEEDED FOR ERECTILE DYSFUNCTION  ?  ? Urology: Erectile Dysfunction Agents Passed - 01/20/2022  6:22 AM  ?  ?  Passed - AST in normal range and within 360 days  ?  AST  ?Date Value Ref Range Status  ?08/09/2021 31 0 - 40 IU/L Final  ? ?SGOT(AST)  ?Date Value Ref Range Status  ?09/07/2012 42 (H) 15 - 37 Unit/L Final  ? ?AST (SGOT) Piccolo, Waived  ?Date Value Ref Range Status  ?08/27/2015 35 11 - 38 U/L Final  ?   ?  ?  Passed - ALT in normal range and within 360 days  ?  ALT  ?Date Value Ref Range Status  ?08/09/2021 24 0 - 44 IU/L Final  ? ?SGPT (ALT)  ?Date Value Ref Range Status  ?09/07/2012 58 12 - 78 U/L Final  ? ?ALT (SGPT) Piccolo, Waived  ?Date Value Ref Range Status  ?08/27/2015 26 10 - 47 U/L Final  ?   ?  ?  Passed - Last BP in normal range  ?  BP Readings from Last 1 Encounters:  ?08/09/21 133/81  ?   ?  ?  Passed - Valid encounter within last 12 months  ?  Recent Outpatient Visits   ?      ? 5 months ago Routine general medical examination at a health care facility  ? [place], DO  ? 11 months ago Primary hypertension  ? [place], Megan P, DO  ? 1 year ago COVID-19  ? [place], DO  ? 1 year ago Routine general medical examination at a health care facility  ? [place], Megan P, DO  ? 1 year ago Muscle cramp  ? [place], [place] P, DO  ?  ?  ?Future Appointments   ?        ? In 1 week  [facility], [facility]  ? In 2 weeks [name], Barb Merino, DO [place], [facility]  ? In 1 month Ralene Bathe, MD [place]  ?  ? ?  ?  ?  ? ?

## 2022-01-21 ENCOUNTER — Telehealth: Payer: Medicare Other

## 2022-01-21 ENCOUNTER — Telehealth: Payer: Self-pay

## 2022-01-21 NOTE — Telephone Encounter (Signed)
?  Care Management  ? ?Follow Up Note ? ? ?01/21/2022 ?Name: Mitchell [name] MRN: [id number] DOB: 11/28/1955 ? ? ?Referred by: Valerie Roys, DO ?Reason for referral : Chronic Care Management (RNCM: Follow up outreach for Chronic Disease Management and Care Coordination Needs) ? ? ?An unsuccessful telephone outreach was attempted today. The patient was referred to the case management team for assistance with care management and care coordination.  ? ?Follow Up Plan: A HIPPA compliant phone message was left for the patient providing contact information and requesting a return call.  ? ?[name] RN, MSN, CCM ?Community Care Coordinator ?[facility] ?[place] ?Mobile: [phone]  ?

## 2022-01-26 ENCOUNTER — Telehealth: Payer: Self-pay

## 2022-01-26 NOTE — Chronic Care Management (AMB) (Signed)
  Chronic Care Management Note  01/26/2022 Name: Mitchell [name] MRN: [id number] DOB: 07-12-1956  [name] is a 66 y.o. year old male who is a primary care patient of Valerie Roys, DO and is actively engaged with the care management team. I reached out to Mitchell [name] by phone today to assist with re-scheduling a follow up visit with the RN Case Manager  Follow up plan: Unsuccessful telephone outreach attempt made. A HIPAA compliant phone message was left for the patient providing contact information and requesting a return call.  The care management team will reach out to the patient again over the next 7 days.  If patient returns call to provider office, please advise to call [facility]  at [place], [place], [address], [place] [address] Direct Dial: [phone] [email or web address]: [email or web address]

## 2022-01-27 ENCOUNTER — Ambulatory Visit: Payer: Medicare Other

## 2022-01-28 ENCOUNTER — Ambulatory Visit: Payer: Medicare Other

## 2022-02-03 NOTE — Chronic Care Management (AMB) (Signed)
  Chronic Care Management Note  02/03/2022 Name: Mitchell [name] MRN: [id number] DOB: 1956-07-18  Mitchell [name] is a 66 y.o. year old male who is a primary care patient of Valerie Roys, DO and is actively engaged with the care management team. I reached out to Odelia Gage by phone today to assist with re-scheduling a follow up visit with the RN Case Manager  Follow up plan: Unsuccessful telephone outreach attempt made. A HIPAA compliant phone message was left for the patient providing contact information and requesting a return call.  The care management team will reach out to the patient again over the next 7 days.  If patient returns call to provider office, please advise to call [facility]  at [place], [place], [place], [place] [address] Direct Dial: [phone] [email or web address]: [email or web address]

## 2022-02-07 ENCOUNTER — Encounter: Payer: Self-pay | Admitting: Family Medicine

## 2022-02-07 ENCOUNTER — Ambulatory Visit (INDEPENDENT_AMBULATORY_CARE_PROVIDER_SITE_OTHER): Payer: Medicare Other | Admitting: Family Medicine

## 2022-02-07 VITALS — BP 117/71 | HR 68 | Temp 98.0°F | Wt 174.4 lb

## 2022-02-07 DIAGNOSIS — I10 Essential (primary) hypertension: Secondary | ICD-10-CM

## 2022-02-07 DIAGNOSIS — F331 Major depressive disorder, recurrent, moderate: Secondary | ICD-10-CM

## 2022-02-07 DIAGNOSIS — F209 Schizophrenia, unspecified: Secondary | ICD-10-CM

## 2022-02-07 DIAGNOSIS — R7301 Impaired fasting glucose: Secondary | ICD-10-CM

## 2022-02-07 DIAGNOSIS — E038 Other specified hypothyroidism: Secondary | ICD-10-CM | POA: Diagnosis not present

## 2022-02-07 DIAGNOSIS — F419 Anxiety disorder, unspecified: Secondary | ICD-10-CM

## 2022-02-07 DIAGNOSIS — E782 Mixed hyperlipidemia: Secondary | ICD-10-CM

## 2022-02-07 LAB — BAYER DCA HB A1C WAIVED: HB A1C (BAYER DCA - WAIVED): 5.4 % (ref 4.8–5.6)

## 2022-02-07 MED ORDER — TADALAFIL 20 MG PO TABS: ORAL_TABLET | ORAL | 12 refills | Status: DC

## 2022-02-07 MED ORDER — HYDROXYZINE HCL 25 MG PO TABS: 25.0000 mg | ORAL_TABLET | Freq: Three times a day (TID) | ORAL | 1 refills | Status: DC | PRN

## 2022-02-07 MED ORDER — ROSUVASTATIN CALCIUM 10 MG PO TABS: 10.0000 mg | ORAL_TABLET | Freq: Every day | ORAL | 1 refills | Status: DC

## 2022-02-07 MED ORDER — LISINOPRIL 5 MG PO TABS: 7.5000 mg | ORAL_TABLET | Freq: Every day | ORAL | 1 refills | Status: DC

## 2022-02-07 MED ORDER — FLOVENT HFA 220 MCG/ACT IN AERO: 2.0000 | INHALATION_SPRAY | Freq: Every day | RESPIRATORY_TRACT | 3 refills | Status: DC

## 2022-02-07 NOTE — Assessment & Plan Note (Signed)
Under good control on current regimen. Continue current regimen. Continue to monitor. Call with any concerns. Refills given. Labs drawn today.   

## 2022-02-07 NOTE — Progress Notes (Signed)
BP 117/71   Pulse 68   Temp 98 F (36.7 C)   Wt 174 lb 6.4 oz (79.1 kg)   SpO2 97%   BMI 31.39 kg/m    Subjective:    Patient ID: Mitchell [name], male    DOB: 1955/09/27, 66 y.o.   MRN: [id number]  HPI: Mitchell [name] is a 66 y.o. male  Chief Complaint  Patient presents with   Hypothyroidism   Hypertension   IFG   Anxiety    Patient would like to know if its okay to take 2 pills of the hydroxyzine at night to sleep.    HYPERTENSION / HYPERLIPIDEMIA Satisfied with current treatment? yes Duration of hypertension: chronic BP monitoring frequency: not checking BP medication side effects: no Past BP meds: lisinopril Duration of hyperlipidemia: chronic Cholesterol medication side effects: no Cholesterol supplements: none Past cholesterol medications: crestor Medication compliance: excellent compliance Aspirin: no Recent stressors: no Recurrent headaches: no Visual changes: no Palpitations: no Dyspnea: no Chest pain: no Lower extremity edema: no Dizzy/lightheaded: no  HYPOTHYROIDISM Thyroid control status:controlled Satisfied with current treatment? yes Medication side effects: no Medication compliance: excellent compliance Etiology of hypothyroidism:  Recent dose adjustment:no Fatigue: no Cold intolerance: no Heat intolerance: no Weight gain: no Weight loss: no Constipation: no Diarrhea/loose stools: no Palpitations: no Lower extremity edema: no Anxiety/depressed mood: no  Impaired Fasting Glucose HbA1C:  Lab Results  Component Value Date   HGBA1C 5.4 02/07/2022   Duration of elevated blood sugar: chronic Polydipsia: no Polyuria: no Weight change: no Visual disturbance: no Glucose Monitoring: no Diabetic Education: Not Completed Family history of diabetes: yes  ANXIETY/STRESS Duration: chronic Status:controlled Anxious mood: no  Excessive worrying: no Irritability: no  Sweating: no Nausea: no Palpitations:no Hyperventilation: no Panic  attacks: no Agoraphobia: no  Obscessions/compulsions: no Depressed mood: no    02/07/2022    3:43 PM 08/09/2021    4:01 PM 02/05/2021    3:55 PM 01/25/2021    3:24 PM 01/22/2020    3:20 PM  Depression screen PHQ 2/9  Decreased Interest 0 0 0 0 0  Down, Depressed, Hopeless 0 1 0 0 0  PHQ - 2 Score 0 1 0 0 0  Altered sleeping 0 1 0    Tired, decreased energy 0 1 1    Change in appetite 0 0 1    Feeling bad or failure about yourself  0 0 0    Trouble concentrating 0 0 0    Moving slowly or fidgety/restless 0 0 0    Suicidal thoughts 0 0 0    PHQ-9 Score 0 3 2    Difficult doing work/chores   Not difficult at all     Anhedonia: no Weight changes: no Insomnia: no   Hypersomnia: no Fatigue/loss of energy: no Feelings of worthlessness: no Feelings of guilt: no Impaired concentration/indecisiveness: no Suicidal ideations: no  Crying spells: no Recent Stressors/Life Changes: no   Relationship problems: no   Family stress: no     Financial stress: no    Job stress: no    Recent death/loss: no   Relevant past medical, surgical, family and social history reviewed and updated as indicated. Interim medical history since our last visit reviewed. Allergies and medications reviewed and updated.  Review of Systems  Constitutional: Negative.   Cardiovascular: Negative.   Gastrointestinal: Negative.   Musculoskeletal: Negative.   Neurological: Negative.   Psychiatric/Behavioral: Negative.     Per HPI unless specifically indicated above  Objective:    BP 117/71   Pulse 68   Temp 98 F (36.7 C)   Wt 174 lb 6.4 oz (79.1 kg)   SpO2 97%   BMI 31.39 kg/m   Wt Readings from Last 3 Encounters:  02/07/22 174 lb 6.4 oz (79.1 kg)  08/09/21 175 lb 3.2 oz (79.5 kg)  02/05/21 182 lb 6.4 oz (82.7 kg)    Physical Exam Vitals and nursing note reviewed.  Constitutional:      General: He is not in acute distress.    Appearance: Normal appearance. He is not ill-appearing,  toxic-appearing or diaphoretic.  HENT:     Head: Normocephalic and atraumatic.     Right Ear: External ear normal.     Left Ear: External ear normal.     Nose: Nose normal.     Mouth/Throat:     Mouth: Mucous membranes are moist.     Pharynx: Oropharynx is clear.  Eyes:     General: No scleral icterus.       Right eye: No discharge.        Left eye: No discharge.     Extraocular Movements: Extraocular movements intact.     Conjunctiva/sclera: Conjunctivae normal.     Pupils: Pupils are equal, round, and reactive to light.  Cardiovascular:     Rate and Rhythm: Normal rate and regular rhythm.     Pulses: Normal pulses.     Heart sounds: Normal heart sounds. No murmur heard.   No friction rub. No gallop.  Pulmonary:     Effort: Pulmonary effort is normal. No respiratory distress.     Breath sounds: Normal breath sounds. No stridor. No wheezing, rhonchi or rales.  Chest:     Chest wall: No tenderness.  Musculoskeletal:        General: Normal range of motion.     Cervical back: Normal range of motion and neck supple.  Skin:    General: Skin is warm and dry.     Capillary Refill: Capillary refill takes less than 2 seconds.     Coloration: Skin is not jaundiced or pale.     Findings: No bruising, erythema, lesion or rash.  Neurological:     General: No focal deficit present.     Mental Status: He is alert and oriented to person, place, and time. Mental status is at baseline.  Psychiatric:        Mood and Affect: Mood normal.        Behavior: Behavior normal.        Thought Content: Thought content normal.        Judgment: Judgment normal.    Results for orders placed or performed in visit on 02/07/22  Bayer DCA Hb A1c Waived  Result Value Ref Range   HB A1C (BAYER DCA - WAIVED) 5.4 4.8 - 5.6 %      Assessment & Plan:   Problem List Items Addressed This Visit       Cardiovascular and Mediastinum   Hypertension    Under good control on current regimen. Continue current  regimen. Continue to monitor. Call with any concerns. Refills given. Labs drawn today.        Relevant Medications   lisinopril (ZESTRIL) 5 MG tablet   rosuvastatin (CRESTOR) 10 MG tablet   tadalafil (CIALIS) 20 MG tablet   Other Relevant Orders   Comprehensive metabolic panel   CBC with Differential/Platelet     Endocrine   IFG (impaired fasting glucose) -  Primary    Rechecking labs today. Await results. Treat as needed.        Relevant Orders   Comprehensive metabolic panel   CBC with Differential/Platelet   Bayer DCA Hb A1c Waived (Completed)   Hypothyroidism    Rechecking labs today. Await results. Treat as needed.        Relevant Orders   Comprehensive metabolic panel   CBC with Differential/Platelet   TSH     Other   Anxiety    Under good control on current regimen. Continue current regimen. Continue to monitor. Call with any concerns. Refills given. Labs drawn today.       Relevant Medications   hydrOXYzine (ATARAX) 25 MG tablet   Depression    Under good control on current regimen. Continue current regimen. Continue to monitor. Call with any concerns. Refills given. Labs drawn today.       Relevant Medications   hydrOXYzine (ATARAX) 25 MG tablet   Schizophrenia (HCC)    Stable. Feeling well. Continue to monitor.        Hyperlipidemia    Under good control on current regimen. Continue current regimen. Continue to monitor. Call with any concerns. Refills given. Labs drawn today.       Relevant Medications   lisinopril (ZESTRIL) 5 MG tablet   rosuvastatin (CRESTOR) 10 MG tablet   tadalafil (CIALIS) 20 MG tablet   Other Relevant Orders   Comprehensive metabolic panel   CBC with Differential/Platelet   Lipid Panel w/o Chol/HDL Ratio   Other Visit Diagnoses     Essential hypertension       Relevant Medications   lisinopril (ZESTRIL) 5 MG tablet   rosuvastatin (CRESTOR) 10 MG tablet   tadalafil (CIALIS) 20 MG tablet        Follow up  plan: Return in about 6 months (around 08/10/2022) for physical.

## 2022-02-07 NOTE — Assessment & Plan Note (Signed)
Stable. Feeling well. Continue to monitor.

## 2022-02-07 NOTE — Assessment & Plan Note (Signed)
Rechecking labs today. Await results. Treat as needed.  °

## 2022-02-08 ENCOUNTER — Encounter: Payer: Self-pay | Admitting: Family Medicine

## 2022-02-08 LAB — COMPREHENSIVE METABOLIC PANEL
ALT: 25 IU/L (ref 0–44)
AST: 23 IU/L (ref 0–40)
Albumin/Globulin Ratio: 1.4 (ref 1.2–2.2)
Albumin: 4.3 g/dL (ref 3.8–4.8)
Alkaline Phosphatase: 51 IU/L (ref 44–121)
BUN/Creatinine Ratio: 21 (ref 10–24)
BUN: 22 mg/dL (ref 8–27)
Bilirubin Total: <0.2 mg/dL (ref 0.0–1.2)
CO2: 20 mmol/L (ref 20–29)
Calcium: 9.3 mg/dL (ref 8.6–10.2)
Chloride: 103 mmol/L (ref 96–106)
Creatinine, Ser: 1.06 mg/dL (ref 0.76–1.27)
Globulin, Total: 3 g/dL (ref 1.5–4.5)
Glucose: 128 mg/dL — ABNORMAL HIGH (ref 70–99)
Potassium: 4.2 mmol/L (ref 3.5–5.2)
Sodium: 137 mmol/L (ref 134–144)
Total Protein: 7.3 g/dL (ref 6.0–8.5)
eGFR: 77 mL/min/{1.73_m2} (ref >59)

## 2022-02-08 LAB — LIPID PANEL W/O CHOL/HDL RATIO
Cholesterol, Total: 177 mg/dL (ref 100–199)
HDL: 37 mg/dL — ABNORMAL LOW (ref >39)
LDL Chol Calc (NIH): 65 mg/dL (ref 0–99)
Triglycerides: 485 mg/dL — ABNORMAL HIGH (ref 0–149)
VLDL Cholesterol Cal: 75 mg/dL — ABNORMAL HIGH (ref 5–40)

## 2022-02-08 LAB — CBC WITH DIFFERENTIAL/PLATELET
Basophils Absolute: 0 10*3/uL (ref 0.0–0.2)
Basos: 0 %
EOS (ABSOLUTE): 0.3 10*3/uL (ref 0.0–0.4)
Eos: 4 %
Hematocrit: 45.2 % (ref 37.5–51.0)
Hemoglobin: 15.6 g/dL (ref 13.0–17.7)
Immature Grans (Abs): 0 10*3/uL (ref 0.0–0.1)
Immature Granulocytes: 0 %
Lymphocytes Absolute: 3 10*3/uL (ref 0.7–3.1)
Lymphs: 41 %
MCH: 31.8 pg (ref 26.6–33.0)
MCHC: 34.5 g/dL (ref 31.5–35.7)
MCV: 92 fL (ref 79–97)
Monocytes Absolute: 0.6 10*3/uL (ref 0.1–0.9)
Monocytes: 9 %
Neutrophils Absolute: 3.4 10*3/uL (ref 1.4–7.0)
Neutrophils: 46 %
Platelets: 278 10*3/uL (ref 150–450)
RBC: 4.9 x10E6/uL (ref 4.14–5.80)
RDW: 13.3 % (ref 11.6–15.4)
WBC: 7.4 10*3/uL (ref 3.4–10.8)

## 2022-02-08 LAB — TSH: TSH: 2.29 u[IU]/mL (ref 0.450–4.500)

## 2022-02-15 NOTE — Chronic Care Management (AMB) (Signed)
  Chronic Care Management Note  02/15/2022 Name: Mitchell [name] MRN: [id number] DOB: [birth year]  Mitchell [name] is a 66 y.o. year old male who is a primary care patient of Valerie Roys, DO and is actively engaged with the care management team. I reached out to Odelia Gage by phone today to assist with re-scheduling a follow up visit with the RN Case Manager  Follow up plan: Unable to make contact on outreach attempts x 3. PCP Valerie Roys, DO notified via routed documentation in medical record.   [name], [place], [place], [place] [address] Direct Dial: [phone] [name] Website: [email or web address]

## 2022-03-02 ENCOUNTER — Ambulatory Visit: Payer: Medicare Other | Admitting: Dermatology

## 2022-03-02 ENCOUNTER — Encounter: Payer: Self-pay | Admitting: Dermatology

## 2022-03-02 DIAGNOSIS — L820 Inflamed seborrheic keratosis: Secondary | ICD-10-CM | POA: Diagnosis not present

## 2022-03-02 DIAGNOSIS — L578 Other skin changes due to chronic exposure to nonionizing radiation: Secondary | ICD-10-CM | POA: Diagnosis not present

## 2022-03-02 DIAGNOSIS — L821 Other seborrheic keratosis: Secondary | ICD-10-CM

## 2022-03-02 DIAGNOSIS — L570 Actinic keratosis: Secondary | ICD-10-CM

## 2022-03-02 DIAGNOSIS — Z1283 Encounter for screening for malignant neoplasm of skin: Secondary | ICD-10-CM | POA: Diagnosis not present

## 2022-03-02 NOTE — Patient Instructions (Signed)
Cryotherapy Aftercare  Wash gently with soap and water everyday.   Apply Vaseline daily until healed.   Prior to procedure, discussed risks of blister formation, small wound, skin dyspigmentation, or rare scar following cryotherapy. Recommend Vaseline ointment to treated areas while healing.   Seborrheic Keratosis  What causes seborrheic keratoses? Seborrheic keratoses are harmless, common skin growths that first appear during adult life.  As time goes by, more growths appear.  Some people may develop a large number of them.  Seborrheic keratoses appear on both covered and uncovered body parts.  They are not caused by sunlight.  The tendency to develop seborrheic keratoses can be inherited.  They vary in color from skin-colored to gray, brown, or even black.  They can be either smooth or have a rough, warty surface.   Seborrheic keratoses are superficial and look as if they were stuck on the skin.  Under the microscope this type of keratosis looks like layers upon layers of skin.  That is why at times the top layer may seem to fall off, but the rest of the growth remains and re-grows.    Treatment Seborrheic keratoses do not need to be treated, but can easily be removed in the office.  Seborrheic keratoses often cause symptoms when they rub on clothing or jewelry.  Lesions can be in the way of shaving.  If they become inflamed, they can cause itching, soreness, or burning.  Removal of a seborrheic keratosis can be accomplished by freezing, burning, or surgery. If any spot bleeds, scabs, or grows rapidly, please return to have it checked, as these can be an indication of a skin cancer.   Due to recent changes in healthcare laws, you may see results of your pathology and/or laboratory studies on MyChart before the doctors have had a chance to review them. We understand that in some cases there may be results that are confusing or concerning to you. Please understand that not all results are received  at the same time and often the doctors may need to interpret multiple results in order to provide you with the best plan of care or course of treatment. Therefore, we ask that you please give [place] 2 business days to thoroughly review all your results before contacting the office for clarification. Should we see a critical lab result, you will be contacted sooner.   If You Need Anything After Your Visit  If you have any questions or concerns for your doctor, please call our main line at [phone] and press option 4 to reach your doctor's medical assistant. If no one answers, please leave a voicemail as directed and we will return your call as soon as possible. Messages left after 4 pm will be answered the following business day.   You may also send [place] a message via [place]. We typically respond to MyChart messages within 1-2 business days.  For prescription refills, please ask your pharmacy to contact our office. Our fax number is [phone].  If you have an urgent issue when the clinic is closed that cannot wait until the next business day, you can page your doctor at the number below.    Please note that while we do our best to be available for urgent issues outside of office hours, we are not available 24/7.   If you have an urgent issue and are unable to reach [place], you may choose to seek medical care at your doctor's office, retail clinic, urgent care center, or emergency room.  If you have a medical emergency, please immediately call 911 or go to the emergency department.  Pager Numbers  - Dr. Nehemiah Massed: [phone]  - Dr. Laurence Ferrari: [phone]  - Dr. Nicole Kindred: [phone]  In the event of inclement weather, please call our main line at [phone] for an update on the status of any delays or closures.  Dermatology Medication Tips: Please keep the boxes that topical medications come in in order to help keep track of the instructions about where and how to use these. Pharmacies  typically print the medication instructions only on the boxes and not directly on the medication tubes.   If your medication is too expensive, please contact our office at [phone] option 4 or send [place] a message through [place].   We are unable to tell what your co-pay for medications will be in advance as this is different depending on your insurance coverage. However, we may be able to find a substitute medication at lower cost or fill out paperwork to get insurance to cover a needed medication.   If a prior authorization is required to get your medication covered by your insurance company, please allow [place] 1-2 business days to complete this process.  Drug prices often vary depending on where the prescription is filled and some pharmacies may offer cheaper prices.  The website www.goodrx.com contains coupons for medications through different pharmacies. The prices here do not account for what the cost may be with help from insurance (it may be cheaper with your insurance), but the website can give you the price if you did not use any insurance.  - You can print the associated coupon and take it with your prescription to the pharmacy.  - You may also stop by our office during regular business hours and pick up a GoodRx coupon card.  - If you need your prescription sent electronically to a different pharmacy, notify our office through [facility] or by phone at [phone] option 4.     Si Usted Necesita Algo Despus de Su Visita  Tambin puede enviarnos un mensaje a travs de Pharmacist, community. Por lo general respondemos a los mensajes de MyChart en el transcurso de 1 a 2 das hbiles.  Para renovar recetas, por favor pida a su farmacia que se ponga en contacto con nuestra oficina. Harland Dingwall de fax es [place] [phone].  Si tiene un asunto urgente cuando la clnica est cerrada y que no puede esperar hasta el siguiente da hbil, puede llamar/localizar a su doctor(a) al nmero que  aparece a continuacin.   Por favor, tenga en cuenta que aunque hacemos todo lo posible para estar disponibles para asuntos urgentes fuera del horario de [place], no estamos disponibles las 24 horas del da, los 7 das de la [place].   Si tiene un problema urgente y no puede comunicarse con nosotros, puede optar por buscar atencin mdica  en el consultorio de su doctor(a), en una clnica privada, en un centro de atencin urgente o en una sala de emergencias.  Si tiene Engineering geologist, por favor llame inmediatamente al 911 o vaya a la sala de emergencias.  Nmeros de bper  - Dr. Nehemiah Massed: [phone]  - Dra. Moye: [phone]  - Dra. Nicole Kindred: [phone]  En caso de inclemencias del [place], por favor llame a Johnsie Kindred principal al [phone] para una actualizacin sobre el [place] de cualquier retraso o cierre.  Consejos para la medicacin en dermatologa: Por favor, guarde las cajas en las que vienen los medicamentos de  uso tpico para ayudarle a seguir las [employer] dnde y cmo usarlos. Las farmacias generalmente imprimen las instrucciones del medicamento slo en las cajas y no directamente en los tubos del [place].   Si su medicamento es muy caro, por favor, pngase en contacto con Zigmund Daniel llamando al [phone] y presione la opcin 4 o envenos un mensaje a travs de Pharmacist, community.   No podemos decirle cul ser su copago por los medicamentos por adelantado ya que esto es diferente dependiendo de la cobertura de su seguro. Sin embargo, es posible que podamos encontrar un medicamento sustituto a Electrical engineer un formulario para que el seguro cubra el medicamento que se considera necesario.   Si se requiere una autorizacin previa para que su compaa de seguros [place] su medicamento, por favor permtanos de 1 a 2 das hbiles para completar este proceso.  Los precios de los medicamentos varan con frecuencia dependiendo del Environmental consultant de dnde se surte  la receta y alguna farmacias pueden ofrecer precios ms baratos.  El sitio web www.goodrx.com tiene cupones para medicamentos de Airline pilot. Los precios aqu no tienen en cuenta lo que podra costar con la ayuda del seguro (puede ser ms barato con su seguro), pero el sitio web puede darle el precio si no utiliz Research scientist (physical sciences).  - Puede imprimir el cupn correspondiente y llevarlo con su receta a la farmacia.  - Tambin puede pasar por nuestra oficina durante el horario de atencin regular y Charity fundraiser una tarjeta de cupones de GoodRx.  - Si necesita que su receta se enve electrnicamente a una farmacia diferente, informe a nuestra oficina a travs de MyChart de [place] o por telfono llamando al [phone] y presione la opcin 4.

## 2022-03-02 NOTE — Progress Notes (Signed)
Follow-Up Visit   Subjective  Mitchell [name] is a 66 y.o. male who presents for the following: Actinic Keratosis (3 month recheck. Right temple. Tx with LN2. Recheck ISK's. Multiple lesions on back Tx with LN2). The patient has spots, moles and lesions to be evaluated, some may be new or changing and the patient has concerns that these could be cancer.  The following portions of the chart were reviewed this encounter and updated as appropriate:  Tobacco  Allergies  Meds  Problems  Med Hx  Surg Hx  Fam Hx     Review of Systems: No other skin or systemic complaints except as noted in HPI or Assessment and Plan.  Objective  Well appearing patient in no apparent distress; mood and affect are within normal limits.  All skin waist up examined.  left lateral infraorbital x1, left forehead above brow x1 (2) Erythematous thin papules/macules with gritty scale.   back x22 (22) Erythematous keratotic or waxy stuck-on papule or plaque.   Assessment & Plan   Actinic Damage - chronic, secondary to cumulative UV radiation exposure/sun exposure over time - diffuse scaly erythematous macules with underlying dyspigmentation - Recommend daily broad spectrum sunscreen SPF 30+ to sun-exposed areas, reapply every 2 hours as needed.  - Recommend staying in the shade or wearing long sleeves, sun glasses (UVA+UVB protection) and wide brim hats (4-inch brim around the entire circumference of the hat). - Call for new or changing lesions.  Seborrheic Keratoses - Stuck-on, waxy, tan-brown papules and/or plaques  - Benign-appearing - Discussed benign etiology and prognosis. - Observe - Call for any changes  AK (actinic keratosis) (2) left lateral infraorbital x1, left forehead above brow x1 Actinic keratoses are precancerous spots that appear secondary to cumulative UV radiation exposure/sun exposure over time. They are chronic with expected duration over 1 year. A portion of actinic keratoses  will progress to squamous cell carcinoma of the skin. It is not possible to reliably predict which spots will progress to skin cancer and so treatment is recommended to prevent development of skin cancer. Recommend daily broad spectrum sunscreen SPF 30+ to sun-exposed areas, reapply every 2 hours as needed.  Recommend staying in the shade or wearing long sleeves, sun glasses (UVA+UVB protection) and wide brim hats (4-inch brim around the entire circumference of the hat). Call for new or changing lesions.  Destruction of lesion - left lateral infraorbital x1, left forehead above brow x1 Complexity: simple   Destruction method: cryotherapy   Informed consent: discussed and consent obtained   Timeout:  patient name, date of birth, surgical site, and procedure verified Lesion destroyed using liquid nitrogen: Yes   Region frozen until ice ball extended beyond lesion: Yes   Outcome: patient tolerated procedure well with no complications   Post-procedure details: wound care instructions given   Additional details:  Prior to procedure, discussed risks of blister formation, small wound, skin dyspigmentation, or rare scar following cryotherapy. Recommend Vaseline ointment to treated areas while healing.  Inflamed seborrheic keratosis (22) back x22 Symptomatic, irritating, patient would like treated.  Destruction of lesion - back x22 Complexity: simple   Destruction method: cryotherapy   Informed consent: discussed and consent obtained   Timeout:  patient name, date of birth, surgical site, and procedure verified Lesion destroyed using liquid nitrogen: Yes   Region frozen until ice ball extended beyond lesion: Yes   Outcome: patient tolerated procedure well with no complications   Post-procedure details: wound care instructions given  Additional details:  Prior to procedure, discussed risks of blister formation, small wound, skin dyspigmentation, or rare scar following cryotherapy. Recommend  Vaseline ointment to treated areas while healing.  Return in about 3 months (around 06/02/2022) for AK Follow Up, ISK Follow Up.  I, Emelia Salisbury, CMA, am acting as scribe for Sarina Ser, MD. Documentation: I have reviewed the above documentation for accuracy and completeness, and I agree with the above.  Sarina Ser, MD

## 2022-03-03 ENCOUNTER — Encounter: Payer: Self-pay | Admitting: Dermatology

## 2022-03-14 ENCOUNTER — Telehealth: Payer: Self-pay | Admitting: Family Medicine

## 2022-03-14 ENCOUNTER — Ambulatory Visit (INDEPENDENT_AMBULATORY_CARE_PROVIDER_SITE_OTHER): Payer: Medicare Other | Admitting: Nurse Practitioner

## 2022-03-16 ENCOUNTER — Ambulatory Visit (INDEPENDENT_AMBULATORY_CARE_PROVIDER_SITE_OTHER): Payer: Medicare Other | Admitting: *Deleted

## 2022-03-16 DIAGNOSIS — Z0000 Encounter for general adult medical examination without abnormal findings: Secondary | ICD-10-CM | POA: Diagnosis not present

## 2022-03-16 NOTE — Progress Notes (Signed)
Subjective:   Mitchell [name] is a 66 y.o. male who presents for Medicare Annual/Subsequent preventive examination.  I connected with  Mitchell [name] on 03/16/22 by a telephone enabled telemedicine application and verified that I am speaking with the correct person using two identifiers.   I discussed the limitations of evaluation and management by telemedicine. The patient expressed understanding and agreed to proceed.  Patient location: home  Provider location: Tele-Health-home    Review of Systems     Cardiac Risk Factors include: advanced age (>71mn, >>45women);male gender;hypertension     Objective:    There were no vitals filed for this visit. There is no height or weight on file to calculate BMI.     03/16/2022   12:04 PM 01/25/2021    3:23 PM 06/21/2020    5:44 AM 03/24/2020    6:03 AM 01/22/2020    3:21 PM 01/21/2019    3:23 PM 01/19/2019    8:27 PM  Advanced Directives  Does Patient Have a Medical Advance Directive? No No No No Yes;No Yes No  Type of Advance Directive      Living will;Healthcare Power of Attorney   Copy of [place]?      No - copy requested   Would patient like information on creating a medical advance directive? No - Patient declined  No - Patient declined No - Patient declined       Current Medications (verified) Outpatient Encounter Medications as of 03/16/2022  Medication Sig   FLOVENT HFA 220 MCG/ACT inhaler Inhale 2 puffs into the lungs daily.   hydrOXYzine (ATARAX) 25 MG tablet Take 1-2 tablets (25-50 mg total) by mouth 3 (three) times daily as needed for anxiety.   levothyroxine (SYNTHROID) 50 MCG tablet TAKE 1 TABLET BY MOUTH  DAILY BEFORE BREAKFAST   lisinopril (ZESTRIL) 5 MG tablet Take 1.5 tablets (7.5 mg total) by mouth daily.   loratadine (CLARITIN) 10 MG tablet Take 10 mg by mouth daily.   Multiple Vitamin (MULTI-VITAMINS) TABS Take by mouth.   rosuvastatin (CRESTOR) 10 MG tablet Take 1 tablet (10 mg total) by  mouth daily.   tadalafil (CIALIS) 20 MG tablet TAKE 1/2 TO 1 TABLET BY MOUTH EVERY OTHER DAY AS NEEDED FOR ERECTILE DYSFUNCTION   No facility-administered encounter medications on file as of 03/16/2022.    Allergies (verified) Haloperidol decanoate, Atorvastatin, and Codeine   History: Past Medical History:  Diagnosis Date   Anxiety    Cancer ([facility]    skin cancer   Depression    Epididymitis    Hepatitis C    Tested positive, treated now told that he does not have it   Hydrocele    Hypertension    IFG (impaired fasting glucose)    Insomnia    Kidney stone    Schizophrenia ([facility]    Past Surgical History:  Procedure Laterality Date   ANKLE FRACTURE SURGERY     COLONOSCOPY  2010   COLONOSCOPY WITH PROPOFOL  06/07/2016   Procedure: COLONOSCOPY WITH PROPOFOL;  Surgeon: DLucilla Lame MD;  Location: [facility] ENDOSCOPY;  Service: Endoscopy;;   ESOPHAGOGASTRODUODENOSCOPY (EGD) WITH PROPOFOL N/A 06/07/2016   Procedure: ESOPHAGOGASTRODUODENOSCOPY (EGD) WITH PROPOFOL;  Surgeon: DLucilla Lame MD;  Location: [facility] ENDOSCOPY;  Service: Endoscopy;  Laterality: N/A;   HERNIA REPAIR  2011   X 2   Family History  Problem Relation Age of Onset   Diabetes Mother    Alzheimer's disease Father  Diabetes Brother    Diabetes Maternal Grandfather    Social History   Socioeconomic History   Marital status: Married    Spouse name: Mitchell [name]   Number of children: 2   Years of education: 9   Highest education level: 9th grade  Occupational History   Occupation: retired   Tobacco Use   Smoking status: Former    Types: Cigarettes    Quit date: 07/06/1995    Years since quitting: 26.7   Smokeless tobacco: Former  Scientific laboratory technician Use: Never used  Substance and Sexual Activity   Alcohol use: Not Currently    Comment: remote history, quit 20+ years ago (1 beer q 2-3 days)   Drug use: No    Comment: remote history, quit 20= years ago   Sexual activity: Yes  Other Topics Concern   Not on file   Social History Narrative   Not on file   Social Determinants of Health   Financial Resource Strain: Low Risk  (03/16/2022)   Overall Financial Resource Strain (CARDIA)    Difficulty of Paying Living Expenses: Not hard at all  Food Insecurity: No Food Insecurity (03/16/2022)   Hunger Vital Sign    Worried About Running Out of Food in the Last Year: Never true    [place] in the Last Year: Never true  Transportation Needs: No Transportation Needs (03/16/2022)   PRAPARE - Hydrologist (Medical): No    Lack of Transportation (Non-Medical): No  Physical Activity: Insufficiently Active (03/16/2022)   Exercise Vital Sign    Days of Exercise per Week: 3 days    Minutes of Exercise per Session: 30 min  Stress: No Stress Concern Present (03/16/2022)   [place]    Feeling of Stress : Not at all  Social Connections: Moderately Integrated (03/16/2022)   Social Connection and Isolation Panel [NHANES]    Frequency of Communication with Friends and Family: More than three times a week    Frequency of Social Gatherings with Friends and Family: Never    Attends Religious Services: More than 4 times per year    Active Member of [employer] or Organizations: No    Attends Music therapist: Never    Marital Status: Married    Tobacco Counseling Counseling given: Not Answered   Clinical Intake:  Pre-visit preparation completed: Yes  Pain : No/denies pain     Nutritional Risks: None Diabetes: No  How often do you need to have someone help you when you read instructions, pamphlets, or other written materials from your doctor or pharmacy?: 1 - Never  Diabetic?   no  Interpreter Needed?: No  Information entered by :: Leroy Kennedy LPN   Activities of Daily Living    03/16/2022   12:12 PM 08/09/2021    4:00 PM  In your present state of health, do you have any difficulty performing  the following activities:  Hearing? 0 0  Vision? 0 0  Difficulty concentrating or making decisions? 0 0  Walking or climbing stairs? 1 0  Dressing or bathing? 0 0  Doing errands, shopping? 0 0  Preparing Food and eating ? N   Using the Toilet? N   In the past six months, have you accidently leaked urine? N   Do you have problems with loss of bowel control? N   Managing your Medications? N   Managing your Finances? N  Housekeeping or managing your Housekeeping? N     Patient Care Team: Valerie Roys, DO as PCP - General (Family Medicine) Vanita Ingles, RN as Case Manager (General Practice)  Indicate any recent Medical Services you may have received from other than [facility] providers in the past year (date may be approximate).     Assessment:   This is a routine wellness examination for Jumar.  Hearing/Vision screen Hearing Screening - Comments:: No trouble hearing Vision Screening - Comments:: Up to date [facility]  Dietary issues and exercise activities discussed: Current Exercise Habits: Home exercise routine, Time (Minutes): 30, Frequency (Times/Week): 3, Weekly Exercise (Minutes/Week): 90, Intensity: Mild   Goals Addressed   None    Depression Screen    03/16/2022   12:08 PM 02/07/2022    3:43 PM 08/09/2021    4:01 PM 02/05/2021    3:55 PM 01/25/2021    3:24 PM 01/22/2020    3:20 PM 09/23/2019    4:34 PM  PHQ 2/9 Scores  PHQ - 2 Score 2 0 1 0 0 0 0  PHQ- 9 Score 2 0 '3 2   2    ' Fall Risk    03/16/2022   12:04 PM 02/07/2022    3:44 PM 02/05/2021    3:55 PM 01/25/2021    3:24 PM 01/22/2020    3:18 PM  Fall Risk   Falls in the past year? 0 0 0 0 0  Number falls in past yr: 0 0 0  0  Injury with Fall? 0 0 0  0  Risk for fall due to :  No Fall Risks No Fall Risks Medication side effect   Follow up Falls evaluation completed;Education provided;Falls prevention discussed Falls evaluation completed Falls evaluation completed Falls evaluation completed;Education provided;Falls  prevention discussed     FALL RISK PREVENTION PERTAINING TO THE HOME:  Any stairs in or around the home? No  If so, are there any without handrails? No  Home free of loose throw rugs in walkways, pet beds, electrical cords, etc? Yes  Adequate lighting in your home to reduce risk of falls? Yes   ASSISTIVE DEVICES UTILIZED TO PREVENT FALLS:  Life alert? No  Use of a cane, walker or w/c? Yes  Grab bars in the bathroom? Yes  Shower chair or bench in shower? Yes  Elevated toilet seat or a handicapped toilet? Yes   TIMED UP AND GO:  Was the test performed? No .    Cognitive Function:        03/16/2022   12:06 PM 01/25/2021    3:25 PM 01/21/2019    3:23 PM 01/12/2017    3:54 PM  6CIT Screen  What Year? 0 points 0 points 0 points 0 points  What month? 0 points 0 points 0 points 0 points  What time? 0 points 0 points 0 points 0 points  Count back from 20 0 points 0 points 0 points 0 points  Months in reverse 4 points 0 points 0 points 0 points  Repeat phrase 0 points 8 points 0 points 4 points  Total Score 4 points 8 points 0 points 4 points    Immunizations Immunization History  Administered Date(s) Administered   Fluad Quad(high Dose 65+) 06/26/2020   Influenza,inj,Quad PF,6+ Mos 06/08/2015, 06/27/2016, 07/18/2017, 07/03/2018   Influenza-Unspecified 07/04/2019, 06/21/2021   Janssen (J&J) SARS-COV-2 Vaccination 12/12/2019   MMR 01/25/1995   Moderna Sars-Covid-2 Vaccination 07/16/2020, 01/04/2021   Pneumococcal Conjugate-13 02/14/2020   Pneumococcal Polysaccharide-23 05/17/2013, 08/09/2021  Td 07/27/2018   Zoster, Live 06/24/2015    TDAP status: Up to date  Flu Vaccine status: Up to date  Pneumococcal vaccine status: Up to date  Covid-19 vaccine status: Information provided on how to obtain vaccines.   Qualifies for Shingles Vaccine? Yes   Zostavax completed Yes   Shingrix Completed?: No.    Education has been provided regarding the importance of this vaccine.  Patient has been advised to call insurance company to determine out of pocket expense if they have not yet received this vaccine. Advised may also receive vaccine at local pharmacy or Health Dept. Verbalized acceptance and understanding.  Screening Tests Health Maintenance  Topic Date Due   COVID-19 Vaccine (4 - Booster for Janssen series) 04/01/2022 (Originally 03/01/2021)   Zoster Vaccines- Shingrix (1 of 2) 06/16/2022 (Originally 12/06/1974)   INFLUENZA VACCINE  04/19/2022   COLONOSCOPY (Pts 45-30yr Insurance coverage will need to be confirmed)  06/07/2026   TETANUS/TDAP  07/27/2028   Pneumonia Vaccine 67 Years old  Completed   Hepatitis C Screening  Completed   HPV VACCINES  Aged Out    Health Maintenance  There are no preventive care reminders to display for this patient.   Colorectal cancer screening: Type of screening: Colonoscopy. Completed 2017. Repeat every 10 years  Lung Cancer Screening: (Low Dose CT Chest recommended if Age 67-80years, 30 pack-year currently smoking OR have quit w/in 15years.) does not qualify.   Lung Cancer Screening Referral:   Additional Screening:  Hepatitis C Screening: does not qualify; Completed 2021  Vision Screening: Recommended annual ophthalmology exams for early detection of glaucoma and other disorders of the eye. Is the patient up to date with their annual eye exam?  Yes  Who is the provider or what is the name of the office in which the patient attends annual eye exams? [facility] If pt is not established with a provider, would they like to be referred to a provider to establish care? No .   Dental Screening: Recommended annual dental exams for proper oral hygiene  Community Resource Referral / Chronic Care Management: CRR required this visit?  No   CCM required this visit?  No      Plan:     I have personally reviewed and noted the following in the patient's chart:   Medical and social history Use of alcohol, tobacco or  illicit drugs  Current medications and supplements including opioid prescriptions. Patient is not currently taking opioid prescriptions. Functional ability and status Nutritional status Physical activity Advanced directives List of other physicians Hospitalizations, surgeries, and ER visits in previous 12 months Vitals Screenings to include cognitive, depression, and falls Referrals and appointments  In addition, I have reviewed and discussed with patient certain preventive protocols, quality metrics, and best practice recommendations. A written personalized care plan for preventive services as well as general preventive health recommendations were provided to patient.     JLeroy Kennedy LPN   [id number]  Nurse Notes:

## 2022-03-16 NOTE — Patient Instructions (Signed)
Mr. Mitchell [name] , Thank you for taking time to come for your Medicare Wellness Visit. I appreciate your ongoing commitment to your health goals. Please review the following plan we discussed and let me know if I can assist you in the future.   Screening recommendations/referrals: Colonoscopy: up to date Recommended yearly ophthalmology/optometry visit for glaucoma screening and checkup Recommended yearly dental visit for hygiene and checkup  Vaccinations: Influenza vaccine: up to date Pneumococcal vaccine: up to date Tdap vaccine: up to date Shingles vaccine: Education provided    Advanced directives: Education provided  Conditions/risks identified:   Next appointment: 08-15-2022 @ 3:40   Mitchell [name]  Preventive Care 67 Years and Older, Male Preventive care refers to lifestyle choices and visits with your health care provider that can promote health and wellness. What does preventive care include? A yearly physical exam. This is also called an annual well check. Dental exams once or twice a year. Routine eye exams. Ask your health care provider how often you should have your eyes checked. Personal lifestyle choices, including: Daily care of your teeth and gums. Regular physical activity. Eating a healthy diet. Avoiding tobacco and drug use. Limiting alcohol use. Practicing safe sex. Taking low doses of aspirin every day. Taking vitamin and mineral supplements as recommended by your health care provider. What happens during an annual well check? The services and screenings done by your health care provider during your annual well check will depend on your age, overall health, lifestyle risk factors, and family history of disease. Counseling  Your health care provider may ask you questions about your: Alcohol use. Tobacco use. Drug use. Emotional well-being. Home and relationship well-being. Sexual activity. Eating habits. History of falls. Memory and ability to understand  (cognition). Work and work Statistician. Screening  You may have the following tests or measurements: Height, weight, and BMI. Blood pressure. Lipid and cholesterol levels. These may be checked every 5 years, or more frequently if you are over 71 years old. Skin check. Lung cancer screening. You may have this screening every year starting at age 38 if you have a 30-pack-year history of smoking and currently smoke or have quit within the past 15 years. Fecal occult blood test (FOBT) of the stool. You may have this test every year starting at age 60. Flexible sigmoidoscopy or colonoscopy. You may have a sigmoidoscopy every 5 years or a colonoscopy every 10 years starting at age 73. Prostate cancer screening. Recommendations will vary depending on your family history and other risks. Hepatitis C blood test. Hepatitis B blood test. Sexually transmitted disease (STD) testing. Diabetes screening. This is done by checking your blood sugar (glucose) after you have not eaten for a while (fasting). You may have this done every 1-3 years. Abdominal aortic aneurysm (AAA) screening. You may need this if you are a current or former smoker. Osteoporosis. You may be screened starting at age 39 if you are at high risk. Talk with your health care provider about your test results, treatment options, and if necessary, the need for more tests. Vaccines  Your health care provider may recommend certain vaccines, such as: Influenza vaccine. This is recommended every year. Tetanus, diphtheria, and acellular pertussis (Tdap, Td) vaccine. You may need a Td booster every 10 years. Zoster vaccine. You may need this after age 61. Pneumococcal 13-valent conjugate (PCV13) vaccine. One dose is recommended after age 67. Pneumococcal polysaccharide (PPSV23) vaccine. One dose is recommended after age 44. Talk to your health care provider about which screenings  and vaccines you need and how often you need them. This  information is not intended to replace advice given to you by your health care provider. Make sure you discuss any questions you have with your health care provider. Document Released: 10/02/2015 Document Revised: 05/25/2016 Document Reviewed: 07/07/2015 Elsevier Interactive Patient Education  2017 [place] Prevention in the Home Falls can cause injuries. They can happen to people of all ages. There are many things you can do to make your home safe and to help prevent falls. What can I do on the outside of my home? Regularly fix the edges of walkways and driveways and fix any cracks. Remove anything that might make you trip as you walk through a door, such as a raised step or threshold. Trim any bushes or trees on the path to your home. Use bright outdoor lighting. Clear any walking paths of anything that might make someone trip, such as rocks or tools. Regularly check to see if handrails are loose or broken. Make sure that both sides of any steps have handrails. Any raised decks and porches should have guardrails on the edges. Have any leaves, snow, or ice cleared regularly. Use sand or salt on walking paths during winter. Clean up any spills in your garage right away. This includes oil or grease spills. What can I do in the bathroom? Use night lights. Install grab bars by the toilet and in the tub and shower. Do not use towel bars as grab bars. Use non-skid mats or decals in the tub or shower. If you need to sit down in the shower, use a plastic, non-slip stool. Keep the floor dry. Clean up any water that spills on the floor as soon as it happens. Remove soap buildup in the tub or shower regularly. Attach bath mats securely with double-sided non-slip rug tape. Do not have throw rugs and other things on the floor that can make you trip. What can I do in the bedroom? Use night lights. Make sure that you have a light by your bed that is easy to reach. Do not use any sheets or  blankets that are too big for your bed. They should not hang down onto the floor. Have a firm chair that has side arms. You can use this for support while you get dressed. Do not have throw rugs and other things on the floor that can make you trip. What can I do in the kitchen? Clean up any spills right away. Avoid walking on wet floors. Keep items that you use a lot in easy-to-reach places. If you need to reach something above you, use a strong step stool that has a grab bar. Keep electrical cords out of the way. Do not use floor polish or wax that makes floors slippery. If you must use wax, use non-skid floor wax. Do not have throw rugs and other things on the floor that can make you trip. What can I do with my stairs? Do not leave any items on the stairs. Make sure that there are handrails on both sides of the stairs and use them. Fix handrails that are broken or loose. Make sure that handrails are as long as the stairways. Check any carpeting to make sure that it is firmly attached to the stairs. Fix any carpet that is loose or worn. Avoid having throw rugs at the top or bottom of the stairs. If you do have throw rugs, attach them to the floor with carpet tape.  Make sure that you have a light switch at the top of the stairs and the bottom of the stairs. If you do not have them, ask someone to add them for you. What else can I do to help prevent falls? Wear shoes that: Do not have high heels. Have rubber bottoms. Are comfortable and fit you well. Are closed at the toe. Do not wear sandals. If you use a stepladder: Make sure that it is fully opened. Do not climb a closed stepladder. Make sure that both sides of the stepladder are locked into place. Ask someone to hold it for you, if possible. Clearly mark and make sure that you can see: Any grab bars or handrails. First and last steps. Where the edge of each step is. Use tools that help you move around (mobility aids) if they are  needed. These include: Canes. Walkers. Scooters. Crutches. Turn on the lights when you go into a dark area. Replace any light bulbs as soon as they burn out. Set up your furniture so you have a clear path. Avoid moving your furniture around. If any of your floors are uneven, fix them. If there are any pets around you, be aware of where they are. Review your medicines with your doctor. Some medicines can make you feel dizzy. This can increase your chance of falling. Ask your doctor what other things that you can do to help prevent falls. This information is not intended to replace advice given to you by your health care provider. Make sure you discuss any questions you have with your health care provider. Document Released: 07/02/2009 Document Revised: 02/11/2016 Document Reviewed: 10/10/2014 Elsevier Interactive Patient Education  2017 [employer].

## 2022-04-04 ENCOUNTER — Telehealth: Payer: Self-pay | Admitting: Family Medicine

## 2022-04-04 NOTE — Telephone Encounter (Signed)
Unfortunately we do not sign off on emotional support animals. I'm happy to refer to psychology if they would like.

## 2022-04-04 NOTE — Telephone Encounter (Signed)
Patient would like provider to write a letter stating that his dog helps him with his anxiety.Please advise

## 2022-04-04 NOTE — Telephone Encounter (Signed)
Routing to provider. Dr. [name], are you aware of anything about this? I did not see it in your most recent note.

## 2022-04-05 NOTE — Telephone Encounter (Signed)
Patient states he is okay with referral to psychology.

## 2022-04-05 NOTE — Telephone Encounter (Signed)
Attempted to contact patient, NA LVM for patient to call back.

## 2022-04-07 NOTE — Telephone Encounter (Signed)
List of counselors printed for him to pick up

## 2022-04-07 NOTE — Telephone Encounter (Signed)
Patient is aware list of counselors will be mailed to address.

## 2022-04-15 ENCOUNTER — Other Ambulatory Visit: Payer: Self-pay | Admitting: Family Medicine

## 2022-04-15 DIAGNOSIS — I10 Essential (primary) hypertension: Secondary | ICD-10-CM

## 2022-04-18 NOTE — Telephone Encounter (Signed)
Refilled 02/07/2022 #[id number] with 1 refill. Requested Prescriptions  Pending Prescriptions Disp Refills  . lisinopril (ZESTRIL) 5 MG tablet [Pharmacy Med Name: Lisinopril 5 MG Oral Tablet] 150 tablet 2    Sig: TAKE 1 AND 1/2 TABLETS BY MOUTH  DAILY     Cardiovascular:  ACE Inhibitors Passed - 04/15/2022 10:59 PM      Passed - Cr in normal range and within 180 days    Creatinine  Date Value Ref Range Status  09/07/2012 1.20 0.60 - 1.30 mg/dL Final   Creatinine, Ser  Date Value Ref Range Status  02/07/2022 1.06 0.76 - 1.27 mg/dL Final         Passed - K in normal range and within 180 days    Potassium  Date Value Ref Range Status  02/07/2022 4.2 3.5 - 5.2 mmol/L Final  09/07/2012 3.7 3.5 - 5.1 mmol/L Final         Passed - Patient is not pregnant      Passed - Last BP in normal range    BP Readings from Last 1 Encounters:  02/07/22 117/71         Passed - Valid encounter within last 6 months    Recent Outpatient Visits          2 months ago IFG (impaired fasting glucose)   [facility] [place], Megan P, DO   8 months ago Routine general medical examination at a health care facility   [place], [place], DO   1 year ago Primary hypertension   [place], [place], DO   1 year ago COVID-19   [place], [place], DO   2 years ago Routine general medical examination at a health care facility   [place], Barb Merino, DO      Future Appointments            In 1 week Valerie Roys, DO [facility], [place]   In 1 month Ralene Bathe, MD [place]   In 3 months [name], Barb Merino, DO [facility], [place]

## 2022-04-20 DIAGNOSIS — H2513 Age-related nuclear cataract, bilateral: Secondary | ICD-10-CM | POA: Diagnosis not present

## 2022-04-20 DIAGNOSIS — H35033 Hypertensive retinopathy, bilateral: Secondary | ICD-10-CM | POA: Diagnosis not present

## 2022-04-20 DIAGNOSIS — H25013 Cortical age-related cataract, bilateral: Secondary | ICD-10-CM | POA: Diagnosis not present

## 2022-04-21 ENCOUNTER — Ambulatory Visit: Payer: Medicare Other | Admitting: Family Medicine

## 2022-04-25 ENCOUNTER — Ambulatory Visit: Payer: Medicare Other | Admitting: Family Medicine

## 2022-05-10 ENCOUNTER — Encounter: Payer: Self-pay | Admitting: Family Medicine

## 2022-05-18 ENCOUNTER — Other Ambulatory Visit: Payer: Self-pay | Admitting: Family Medicine

## 2022-05-18 NOTE — Telephone Encounter (Signed)
Requested medication (s) are due for refill today:   Yes  Requested medication (s) are on the active medication list:   Yes  Future visit scheduled:   Yes in 3 months   Last ordered: 09/02/2021 #90, 3 refills  Returned because a 1 yr supply being requested #100   See pharmacy note.     Requested Prescriptions  Pending Prescriptions Disp Refills   levothyroxine (SYNTHROID) 50 MCG tablet [Pharmacy Med Name: Levothyroxine Sodium 50 MCG Oral Tablet] 100 tablet 2    Sig: TAKE 1 TABLET BY MOUTH  DAILY BEFORE BREAKFAST     Endocrinology:  Hypothyroid Agents Passed - 05/18/2022  6:34 AM      Passed - TSH in normal range and within 360 days    TSH  Date Value Ref Range Status  02/07/2022 2.290 0.450 - 4.500 uIU/mL Final         Passed - Valid encounter within last 12 months    Recent Outpatient Visits           3 months ago IFG (impaired fasting glucose)   [facility] [place], Megan P, DO   9 months ago Routine general medical examination at a health care facility   [place], Boyle, DO   1 year ago Primary hypertension   [place], [place], DO   1 year ago COVID-19   [place], [place], DO   2 years ago Routine general medical examination at a health care facility   [place], Barb Merino, DO       Future Appointments             In 3 weeks Ralene Bathe, MD [place]   In 3 months [name], Barb Merino, [place], [place]

## 2022-06-09 ENCOUNTER — Ambulatory Visit (INDEPENDENT_AMBULATORY_CARE_PROVIDER_SITE_OTHER): Payer: Medicare Other | Admitting: Dermatology

## 2022-06-09 DIAGNOSIS — L578 Other skin changes due to chronic exposure to nonionizing radiation: Secondary | ICD-10-CM

## 2022-06-09 DIAGNOSIS — L820 Inflamed seborrheic keratosis: Secondary | ICD-10-CM

## 2022-06-09 DIAGNOSIS — L821 Other seborrheic keratosis: Secondary | ICD-10-CM | POA: Diagnosis not present

## 2022-06-09 NOTE — Patient Instructions (Addendum)
Cryotherapy Aftercare  Wash gently with soap and water everyday.   Apply Vaseline and Band-Aid daily until healed.     Due to recent changes in healthcare laws, you may see results of your pathology and/or laboratory studies on MyChart before the doctors have had a chance to review them. We understand that in some cases there may be results that are confusing or concerning to you. Please understand that not all results are received at the same time and often the doctors may need to interpret multiple results in order to provide you with the best plan of care or course of treatment. Therefore, we ask that you please give us 2 business days to thoroughly review all your results before contacting the office for clarification. Should we see a critical lab result, you will be contacted sooner.   If You Need Anything After Your Visit  If you have any questions or concerns for your doctor, please call our main line at [phone] and press option 4 to reach your doctor's medical assistant. If no one answers, please leave a voicemail as directed and we will return your call as soon as possible. Messages left after 4 pm will be answered the following business day.   You may also send us a message via MyChart. We typically respond to MyChart messages within 1-2 business days.  For prescription refills, please ask your pharmacy to contact our office. Our fax number is [phone].  If you have an urgent issue when the clinic is closed that cannot wait until the next business day, you can page your doctor at the number below.    Please note that while we do our best to be available for urgent issues outside of office hours, we are not available 24/7.   If you have an urgent issue and are unable to reach us, you may choose to seek medical care at your doctor's office, retail clinic, urgent care center, or emergency room.  If you have a medical emergency, please immediately call 911 or go to the  emergency department.  Pager Numbers  - Dr. Kowalski: [phone]  - Dr. Moye: [phone]  - Dr. Stewart: [phone]  In the event of inclement weather, please call our main line at [phone] for an update on the status of any delays or closures.  Dermatology Medication Tips: Please keep the boxes that topical medications come in in order to help keep track of the instructions about where and how to use these. Pharmacies typically print the medication instructions only on the boxes and not directly on the medication tubes.   If your medication is too expensive, please contact our office at [phone] option 4 or send us a message through MyChart.   We are unable to tell what your co-pay for medications will be in advance as this is different depending on your insurance coverage. However, we may be able to find a substitute medication at lower cost or fill out paperwork to get insurance to cover a needed medication.   If a prior authorization is required to get your medication covered by your insurance company, please allow us 1-2 business days to complete this process.  Drug prices often vary depending on where the prescription is filled and some pharmacies may offer cheaper prices.  The website www.goodrx.com contains coupons for medications through different pharmacies. The prices here do not account for what the cost may be with help from insurance (it may be cheaper with your insurance), but the website can   give you the price if you did not use any insurance.  - You can print the associated coupon and take it with your prescription to the pharmacy.  - You may also stop by our office during regular business hours and pick up a GoodRx coupon card.  - If you need your prescription sent electronically to a different pharmacy, notify our office through [facility] MyChart or by phone at [phone] option 4.     Si Usted Necesita Algo Despus de Su Visita  Tambin puede  enviarnos un mensaje a travs de MyChart. Por lo general respondemos a los mensajes de MyChart en el transcurso de 1 a 2 das hbiles.  Para renovar recetas, por favor pida a su farmacia que se ponga en contacto con nuestra oficina. Nuestro nmero de fax es el [phone].  Si tiene un asunto urgente cuando la clnica est cerrada y que no puede esperar hasta el siguiente da hbil, puede llamar/localizar a su doctor(a) al nmero que aparece a continuacin.   Por favor, tenga en cuenta que aunque hacemos todo lo posible para estar disponibles para asuntos urgentes fuera del horario de oficina, no estamos disponibles las 24 horas del da, los 7 das de la semana.   Si tiene un problema urgente y no puede comunicarse con nosotros, puede optar por buscar atencin mdica  en el consultorio de su doctor(a), en una clnica privada, en un centro de atencin urgente o en una sala de emergencias.  Si tiene una emergencia mdica, por favor llame inmediatamente al 911 o vaya a la sala de emergencias.  Nmeros de bper  - Dr. Kowalski: [phone]  - Dra. Moye: [phone]  - Dra. Stewart: [phone]  En caso de inclemencias del tiempo, por favor llame a nuestra lnea principal al [phone] para una actualizacin sobre el estado de cualquier retraso o cierre.  Consejos para la medicacin en dermatologa: Por favor, guarde las cajas en las que vienen los medicamentos de uso tpico para ayudarle a seguir las instrucciones sobre dnde y cmo usarlos. Las farmacias generalmente imprimen las instrucciones del medicamento slo en las cajas y no directamente en los tubos del medicamento.   Si su medicamento es muy caro, por favor, pngase en contacto con nuestra oficina llamando al [phone] y presione la opcin 4 o envenos un mensaje a travs de MyChart.   No podemos decirle cul ser su copago por los medicamentos por adelantado ya que esto es diferente dependiendo de la cobertura de su seguro.  Sin embargo, es posible que podamos encontrar un medicamento sustituto a menor costo o llenar un formulario para que el seguro cubra el medicamento que se considera necesario.   Si se requiere una autorizacin previa para que su compaa de seguros cubra su medicamento, por favor permtanos de 1 a 2 das hbiles para completar este proceso.  Los precios de los medicamentos varan con frecuencia dependiendo del lugar de dnde se surte la receta y alguna farmacias pueden ofrecer precios ms baratos.  El sitio web www.goodrx.com tiene cupones para medicamentos de diferentes farmacias. Los precios aqu no tienen en cuenta lo que podra costar con la ayuda del seguro (puede ser ms barato con su seguro), pero el sitio web puede darle el precio si no utiliz ningn seguro.  - Puede imprimir el cupn correspondiente y llevarlo con su receta a la farmacia.  - Tambin puede pasar por nuestra oficina durante el horario de atencin regular y recoger una tarjeta de cupones de GoodRx.  -   Si necesita que su receta se enve electrnicamente a una farmacia diferente, informe a nuestra oficina a travs de MyChart de [place] o por telfono llamando al [phone] y presione la opcin 4.  

## 2022-06-09 NOTE — Progress Notes (Signed)
   Follow-Up Visit   Subjective  Mitchell [name] is a 66 y.o. male who presents for the following: Follow-up (3 months f/u on irritated spots on his back and face). The patient has spots, moles and lesions to be evaluated, some may be new or changing and the patient has concerns that these could be cancer.  The following portions of the chart were reviewed this encounter and updated as appropriate:   Tobacco  Allergies  Meds  Problems  Med Hx  Surg Hx  Fam Hx     Review of Systems:  No other skin or systemic complaints except as noted in HPI or Assessment and Plan.  Objective  Well appearing patient in no apparent distress; mood and affect are within normal limits.  A focused examination was performed including back. Relevant physical exam findings are noted in the Assessment and Plan.  back x 30, right temple x 1  (31) (31) Stuck-on, waxy, tan-brown papules -- Discussed benign etiology and prognosis.    Assessment & Plan  Inflamed seborrheic keratosis (31) back x 30, right temple x 1  (31)  Symptomatic, irritating, patient would like treated.   Destruction of lesion - back x 30, right temple x 1  (31) Complexity: simple   Destruction method: cryotherapy   Informed consent: discussed and consent obtained   Timeout:  patient name, date of birth, surgical site, and procedure verified Lesion destroyed using liquid nitrogen: Yes   Region frozen until ice ball extended beyond lesion: Yes   Outcome: patient tolerated procedure well with no complications   Post-procedure details: wound care instructions given    Actinic Damage - chronic, secondary to cumulative UV radiation exposure/sun exposure over time - diffuse scaly erythematous macules with underlying dyspigmentation - Recommend daily broad spectrum sunscreen SPF 30+ to sun-exposed areas, reapply every 2 hours as needed.  - Recommend staying in the shade or wearing long sleeves, sun glasses (UVA+UVB protection) and wide  brim hats (4-inch brim around the entire circumference of the hat). - Call for new or changing lesions.  Seborrheic Keratoses - Stuck-on, waxy, tan-brown papules and/or plaques  - Benign-appearing - Discussed benign etiology and prognosis. - Observe - Call for any changes  Return in about 5 months (around 11/09/2022) for ISK's .  IMarye Round, CMA, am acting as scribe for Sarina Ser, MD .  Documentation: I have reviewed the above documentation for accuracy and completeness, and I agree with the above.  Sarina Ser, MD

## 2022-06-10 ENCOUNTER — Other Ambulatory Visit: Payer: Self-pay | Admitting: Family Medicine

## 2022-06-13 NOTE — Telephone Encounter (Signed)
Requested Prescriptions  Pending Prescriptions Disp Refills  . rosuvastatin (CRESTOR) 10 MG tablet [Pharmacy Med Name: Rosuvastatin Calcium 10 MG Oral Tablet] 100 tablet 2    Sig: TAKE 1 TABLET BY MOUTH DAILY     Cardiovascular:  Antilipid - Statins 2 Failed - 06/10/2022 10:37 PM      Failed - Lipid Panel in normal range within the last 12 months    Cholesterol, Total  Date Value Ref Range Status  02/07/2022 177 100 - 199 mg/dL Final   Cholesterol Piccolo, Waived  Date Value Ref Range Status  03/31/2016 155 <200 mg/dL Final    Comment:                            Desirable                <200                         Borderline High      200- 239                         High                     >239    LDL Chol Calc (NIH)  Date Value Ref Range Status  02/07/2022 65 0 - 99 mg/dL Final   HDL  Date Value Ref Range Status  02/07/2022 37 (L) >39 mg/dL Final   Triglycerides  Date Value Ref Range Status  02/07/2022 485 (H) 0 - 149 mg/dL Final   Triglycerides Piccolo,Waived  Date Value Ref Range Status  03/31/2016 338 (H) <150 mg/dL Final    Comment:                            Normal                   <150                         Borderline High     150 - 199                         High                200 - 499                         Very High                >499          Passed - Cr in normal range and within 360 days    Creatinine  Date Value Ref Range Status  09/07/2012 1.20 0.60 - 1.30 mg/dL Final   Creatinine, Ser  Date Value Ref Range Status  02/07/2022 1.06 0.76 - 1.27 mg/dL Final         Passed - Patient is not pregnant      Passed - Valid encounter within last 12 months    Recent Outpatient Visits          4 months ago IFG (impaired fasting glucose)   [facility] [place], Megan P, DO   10 months ago Routine general medical examination at a health care facility  [place], [place], DO   1 year ago Primary hypertension    [place], [place], DO   1 year ago COVID-19   [place], [place], DO   2 years ago Routine general medical examination at a health care facility   [place], Barb Merino, DO      Future Appointments            In 2 months [name], Barb Merino, DO [facility], [place]   In 5 months Ralene Bathe, MD [place]

## 2022-06-14 ENCOUNTER — Encounter: Payer: Self-pay | Admitting: Dermatology

## 2022-06-30 ENCOUNTER — Other Ambulatory Visit: Payer: Self-pay | Admitting: Family Medicine

## 2022-06-30 DIAGNOSIS — I10 Essential (primary) hypertension: Secondary | ICD-10-CM

## 2022-07-01 NOTE — Telephone Encounter (Signed)
Requested Prescriptions  Pending Prescriptions Disp Refills  . lisinopril (ZESTRIL) 5 MG tablet [Pharmacy Med Name: Lisinopril 5 MG Oral Tablet] 120 tablet 3    Sig: TAKE 1 AND 1/2 TABLETS BY MOUTH  DAILY     Cardiovascular:  ACE Inhibitors Passed - 06/30/2022 11:11 PM      Passed - Cr in normal range and within 180 days    Creatinine  Date Value Ref Range Status  09/07/2012 1.20 0.60 - 1.30 mg/dL Final   Creatinine, Ser  Date Value Ref Range Status  02/07/2022 1.06 0.76 - 1.27 mg/dL Final         Passed - K in normal range and within 180 days    Potassium  Date Value Ref Range Status  02/07/2022 4.2 3.5 - 5.2 mmol/L Final  09/07/2012 3.7 3.5 - 5.1 mmol/L Final         Passed - Patient is not pregnant      Passed - Last BP in normal range    BP Readings from Last 1 Encounters:  02/07/22 117/71         Passed - Valid encounter within last 6 months    Recent Outpatient Visits          4 months ago IFG (impaired fasting glucose)   [facility] [place], Megan P, DO   10 months ago Routine general medical examination at a health care facility   [place], [place], DO   1 year ago Primary hypertension   [place], [place], DO   1 year ago COVID-19   [place], [place], DO   2 years ago Routine general medical examination at a health care facility   [place], Barb Merino, DO      Future Appointments            In 1 month [name], Barb Merino, DO [employer], [place]   In 4 months Ralene Bathe, MD [place]

## 2022-07-06 DIAGNOSIS — Z20822 Contact with and (suspected) exposure to covid-19: Secondary | ICD-10-CM | POA: Diagnosis not present

## 2022-07-06 DIAGNOSIS — U071 COVID-19: Secondary | ICD-10-CM | POA: Diagnosis not present

## 2022-07-06 DIAGNOSIS — R059 Cough, unspecified: Secondary | ICD-10-CM | POA: Diagnosis not present

## 2022-07-08 ENCOUNTER — Ambulatory Visit: Payer: Self-pay

## 2022-07-08 NOTE — Telephone Encounter (Signed)
Chief Complaint: COVID + Symptoms: Mild S/s, just feels poorly Frequency: Weds Pertinent Negatives: Patient denies Current fever. SOB, hard cough Disposition: [] ED /[] Urgent Care (no appt availability in office) / [] Appointment(In office/virtual)/ []  [place]/ [x] Home Care/ [] Refused Recommended Disposition /[] [facility]/ []  Follow-up with PCP Additional Notes: PT took a home test on Weds and was positive. Pt has had all boosters, but the  last. Pt reports mild s/s "just feels  a little under the weather". PT is taking mucinex and Tylenol for symptoms. PT will call back or go to UC if needed.   Summary: cold and flu like symptoms / rx req   The patient has tested positive for Covid 19 on 07/06/22   The patient shares that they have been previously directed to take ibuprofen but would like to know if they can take alieve to help with their discomfort   The patient is having cough and congestion as well as body aches for roughly 3 days   The patient shares that they're continuing to experience symptoms   Please contact further when possible      Reason for Disposition  [1] COVID-19 diagnosed by positive lab test (e.g., PCR, rapid self-test kit) AND [2] mild symptoms (e.g., cough, fever, others) AND [2] no complications or SOB  Answer Assessment - Initial Assessment Questions 1. COVID-19 DIAGNOSIS: "How do you know that you have COVID?" (e.g., positive lab test or self-test, diagnosed by doctor or NP/PA, symptoms after exposure).     Home test  and test at UC 2. COVID-19 EXPOSURE: "Was there any known exposure to COVID before the symptoms began?" CDC Definition of close contact: within 6 feet (2 meters) for a total of 15 minutes or more over a 24-hour period.      Unsure 3. ONSET: "When did the COVID-19 symptoms start?"      Weds evening 4. WORST SYMPTOM: "What is your worst symptom?" (e.g., cough, fever, shortness of breath, muscle aches)     Feeling  poorly 5. COUGH: "Do you have a cough?" If Yes, ask: "How bad is the cough?"       Not too bad 6. FEVER: "Do you have a fever?" If Yes, ask: "What is your temperature, how was it measured, and when did it start?"     No - had a small fever 7. RESPIRATORY STATUS: "Describe your breathing?" (e.g., normal; shortness of breath, wheezing, unable to speak)      no 8. BETTER-SAME-WORSE: "Are you getting better, staying the same or getting worse compared to yesterday?"  If getting worse, ask, "In what way?"     same 9. OTHER SYMPTOMS: "Do you have any other symptoms?"  (e.g., chills, fatigue, headache, loss of smell or taste, muscle pain, sore throat)     ba 10. HIGH RISK DISEASE: "Do you have any chronic medical problems?" (e.g., asthma, heart or lung disease, weak immune system, obesity, etc.)       no 11. VACCINE: "Have you had the COVID-19 vaccine?" If Yes, ask: "Which one, how many shots, when did you get it?"       yes 12. PREGNANCY: "Is there any chance you are pregnant?" "When was your last menstrual period?"       na 13. O2 SATURATION MONITOR:  "Do you use an oxygen saturation monitor (pulse oximeter) at home?" If Yes, ask "What is your reading (oxygen level) today?" "What is your usual oxygen saturation reading?" (e.g., 95%)  Protocols used: Coronavirus (  NPHQN-01) Diagnosed or Suspected-A-AH

## 2022-07-22 ENCOUNTER — Other Ambulatory Visit: Payer: Self-pay | Admitting: Family Medicine

## 2022-08-15 ENCOUNTER — Encounter: Payer: Medicare Other | Admitting: Family Medicine

## 2022-08-16 ENCOUNTER — Encounter: Payer: Medicare Other | Admitting: Family Medicine

## 2022-08-26 ENCOUNTER — Other Ambulatory Visit: Payer: Self-pay | Admitting: Family Medicine

## 2022-08-26 NOTE — Telephone Encounter (Signed)
Requested Prescriptions  Pending Prescriptions Disp Refills   rosuvastatin (CRESTOR) 10 MG tablet [Pharmacy Med Name: Rosuvastatin Calcium 10 MG Oral Tablet] 90 tablet 0    Sig: TAKE 1 TABLET BY MOUTH DAILY     Cardiovascular:  Antilipid - Statins 2 Failed - 08/26/2022  6:34 AM      Failed - Lipid Panel in normal range within the last 12 months    Cholesterol, Total  Date Value Ref Range Status  02/07/2022 177 100 - 199 mg/dL Final   Cholesterol Piccolo, Waived  Date Value Ref Range Status  03/31/2016 155 <200 mg/dL Final    Comment:                            Desirable                <200                         Borderline High      200- 239                         High                     >239    LDL Chol Calc (NIH)  Date Value Ref Range Status  02/07/2022 65 0 - 99 mg/dL Final   HDL  Date Value Ref Range Status  02/07/2022 37 (L) >39 mg/dL Final   Triglycerides  Date Value Ref Range Status  02/07/2022 485 (H) 0 - 149 mg/dL Final   Triglycerides Piccolo,Waived  Date Value Ref Range Status  03/31/2016 338 (H) <150 mg/dL Final    Comment:                            Normal                   <150                         Borderline High     150 - 199                         High                200 - 499                         Very High                >499          Passed - Cr in normal range and within 360 days    Creatinine  Date Value Ref Range Status  09/07/2012 1.20 0.60 - 1.30 mg/dL Final   Creatinine, Ser  Date Value Ref Range Status  02/07/2022 1.06 0.76 - 1.27 mg/dL Final         Passed - Patient is not pregnant      Passed - Valid encounter within last 12 months    Recent Outpatient Visits           6 months ago IFG (impaired fasting glucose)   [facility] [place], Megan P, DO   1 year ago Routine general medical examination at a health care  facility   [place], [place] P, DO   1 year ago Primary hypertension    [place], [place], DO   1 year ago COVID-19   [place], [place], DO   2 years ago Routine general medical examination at a health care facility   [place], Barb Merino, DO       Future Appointments             In 3 days Valerie Roys, DO [facility], [place]   In 2 months Ralene Bathe, MD [place]

## 2022-08-29 ENCOUNTER — Encounter: Payer: Self-pay | Admitting: Family Medicine

## 2022-08-29 ENCOUNTER — Ambulatory Visit (INDEPENDENT_AMBULATORY_CARE_PROVIDER_SITE_OTHER): Payer: Medicare Other | Admitting: Family Medicine

## 2022-08-29 VITALS — BP 112/69 | HR 88 | Temp 98.1°F | Ht 63.0 in | Wt 176.7 lb

## 2022-08-29 DIAGNOSIS — I10 Essential (primary) hypertension: Secondary | ICD-10-CM

## 2022-08-29 DIAGNOSIS — F419 Anxiety disorder, unspecified: Secondary | ICD-10-CM

## 2022-08-29 DIAGNOSIS — E038 Other specified hypothyroidism: Secondary | ICD-10-CM | POA: Diagnosis not present

## 2022-08-29 DIAGNOSIS — N138 Other obstructive and reflux uropathy: Secondary | ICD-10-CM

## 2022-08-29 DIAGNOSIS — N401 Enlarged prostate with lower urinary tract symptoms: Secondary | ICD-10-CM

## 2022-08-29 DIAGNOSIS — R7301 Impaired fasting glucose: Secondary | ICD-10-CM | POA: Diagnosis not present

## 2022-08-29 DIAGNOSIS — E782 Mixed hyperlipidemia: Secondary | ICD-10-CM | POA: Diagnosis not present

## 2022-08-29 DIAGNOSIS — Z0000 Encounter for general adult medical examination without abnormal findings: Secondary | ICD-10-CM | POA: Diagnosis not present

## 2022-08-29 DIAGNOSIS — F331 Major depressive disorder, recurrent, moderate: Secondary | ICD-10-CM

## 2022-08-29 MED ORDER — LISINOPRIL 5 MG PO TABS: 7.5000 mg | ORAL_TABLET | Freq: Every day | ORAL | 1 refills | Status: DC

## 2022-08-29 MED ORDER — ROSUVASTATIN CALCIUM 10 MG PO TABS: 10.0000 mg | ORAL_TABLET | Freq: Every day | ORAL | 1 refills | Status: DC

## 2022-08-29 MED ORDER — TADALAFIL 20 MG PO TABS: ORAL_TABLET | ORAL | 12 refills | Status: DC

## 2022-08-29 NOTE — Assessment & Plan Note (Signed)
Under good control on current regimen. Continue current regimen. Continue to monitor. Call with any concerns. Refills given. Labs drawn today.   

## 2022-08-29 NOTE — Progress Notes (Signed)
BP 112/69   Pulse 88   Temp 98.1 F (36.7 C) (Oral)   Ht _0  (1.6 m)   Wt 176 lb 11.2 oz (80.2 kg)   SpO2 95%   BMI 31.30 kg/m    Subjective:    Patient ID: Mitchell [name], male    DOB: 1956/05/27, 66 y.o.   MRN: [id number]  HPI: Mitchell [name] is a 66 y.o. male presenting on 08/29/2022 for comprehensive medical examination. Current medical complaints include:  HYPERTENSION / HYPERLIPIDEMIA Satisfied with current treatment? yes Duration of hypertension: chronic BP monitoring frequency: not checking BP medication side effects: no Past BP meds: lisinopril Duration of hyperlipidemia: chronic Cholesterol medication side effects: no Cholesterol supplements: none Past cholesterol medications: crestor Medication compliance: excellent compliance Aspirin: no Recent stressors: no Recurrent headaches: no Visual changes: no Palpitations: no Dyspnea: no Chest pain: no Lower extremity edema: no Dizzy/lightheaded: no  HYPOTHYROIDISM Thyroid control status:controlled Satisfied with current treatment? yes Medication side effects: no Medication compliance: excellent compliance Recent dose adjustment:no Fatigue: no Cold intolerance: no Heat intolerance: no Weight gain: no Weight loss: no Constipation: no Diarrhea/loose stools: no Palpitations: no Lower extremity edema: no Anxiety/depressed mood: no  BPH BPH status: controlled Satisfied with current treatment?: yes Medication side effects: no Medication compliance: excellent compliance Duration: chronic Nocturia: 1/night Urinary frequency:no Incomplete voiding: no Urgency: no Weak urinary stream: no Straining to start stream: no Dysuria: no Onset: gradual Severity: severe  ANXIETY/DEPRESSION Duration: chronic Status:controlled Anxious mood: no  Excessive worrying: no Irritability: no  Sweating: no Nausea: no Palpitations:no Hyperventilation: no Panic attacks: no Agoraphobia: no   Obscessions/compulsions: no Depressed mood: no    08/29/2022    3:35 PM 03/16/2022   12:08 PM 02/07/2022    3:43 PM 08/09/2021    4:01 PM 02/05/2021    3:55 PM  Depression screen PHQ 2/9  Decreased Interest 0 1 0 0 0  Down, Depressed, Hopeless 0 1 0 1 0  PHQ - 2 Score 0 2 0 1 0  Altered sleeping 0 0 0 1 0  Tired, decreased energy 0 0 0 1 1  Change in appetite 0 0 0 0 1  Feeling bad or failure about yourself  0 0 0 0 0  Trouble concentrating 0 0 0 0 0  Moving slowly or fidgety/restless 2 0 0 0 0  Suicidal thoughts 0 0 0 0 0  PHQ-9 Score 2 2 0 3 2  Difficult doing work/chores Not difficult at all Not difficult at all   Not difficult at all   Anhedonia: no Weight changes: no Insomnia: no   Hypersomnia: no Fatigue/loss of energy: no Feelings of worthlessness: no Feelings of guilt: no Impaired concentration/indecisiveness: no Suicidal ideations: no  Crying spells: no Recent Stressors/Life Changes: no   Relationship problems: no   Family stress: no     Financial stress: no    Job stress: no    Recent death/loss: no   He currently lives with: wife Interim Problems from his last visit: no  Depression Screen done today and results listed below:     08/29/2022    3:35 PM 03/16/2022   12:08 PM 02/07/2022    3:43 PM 08/09/2021    4:01 PM 02/05/2021    3:55 PM  Depression screen PHQ 2/9  Decreased Interest 0 1 0 0 0  Down, Depressed, Hopeless 0 1 0 1 0  PHQ - 2 Score 0 2 0 1 0  Altered sleeping 0 0 0  1 0  Tired, decreased energy 0 0 0 1 1  Change in appetite 0 0 0 0 1  Feeling bad or failure about yourself  0 0 0 0 0  Trouble concentrating 0 0 0 0 0  Moving slowly or fidgety/restless 2 0 0 0 0  Suicidal thoughts 0 0 0 0 0  PHQ-9 Score 2 2 0 3 2  Difficult doing work/chores Not difficult at all Not difficult at all   Not difficult at all    Past Medical History:  Past Medical History:  Diagnosis Date   Anxiety    Cancer ([place])    skin cancer   Depression     Epididymitis    Hepatitis C    Tested positive, treated now told that he does not have it   Hydrocele    Hypertension    IFG (impaired fasting glucose)    Insomnia    Kidney stone    Schizophrenia [facility])     Surgical History:  Past Surgical History:  Procedure Laterality Date   ANKLE FRACTURE SURGERY     COLONOSCOPY  2010   COLONOSCOPY WITH PROPOFOL  06/07/2016   Procedure: COLONOSCOPY WITH PROPOFOL;  Surgeon: [name], MD;  Location: [facility] ENDOSCOPY;  Service: Endoscopy;;   ESOPHAGOGASTRODUODENOSCOPY (EGD) WITH PROPOFOL N/A 06/07/2016   Procedure: ESOPHAGOGASTRODUODENOSCOPY (EGD) WITH PROPOFOL;  Surgeon: [name], MD;  Location: [facility] ENDOSCOPY;  Service: Endoscopy;  Laterality: N/A;   HERNIA REPAIR  2011   X 2    Medications:  Current Outpatient Medications on File Prior to Visit  Medication Sig   FLOVENT HFA 220 MCG/ACT inhaler Inhale 2 puffs into the lungs daily.   hydrOXYzine (ATARAX) 25 MG tablet Take 1-2 tablets (25-50 mg total) by mouth 3 (three) times daily as needed for anxiety.   levothyroxine (SYNTHROID) 50 MCG tablet TAKE 1 TABLET BY MOUTH DAILY  BEFORE BREAKFAST   loratadine (CLARITIN) 10 MG tablet Take 10 mg by mouth daily.   Multiple Vitamin (MULTI-VITAMINS) TABS Take by mouth.   No current facility-administered medications on file prior to visit.    Allergies:  Allergies  Allergen Reactions   Haloperidol Decanoate Other (See Comments)    Muscle pain   Atorvastatin Other (See Comments)    myalgias   Codeine Nausea And Vomiting    Social History:  Social History   Socioeconomic History   Marital status: Married    Spouse name: Otila Kluver   Number of children: 2   Years of education: 9   Highest education level: 9th grade  Occupational History   Occupation: retired   Tobacco Use   Smoking status: Former    Types: Cigarettes    Quit date: 07/06/1995    Years since quitting: 27.1   Smokeless tobacco: Former  Scientific laboratory technician Use: Never used   Substance and Sexual Activity   Alcohol use: Not Currently    Comment: remote history, quit 20+ years ago (1 beer q 2-3 days)   Drug use: No    Comment: remote history, quit 20= years ago   Sexual activity: Yes  Other Topics Concern   Not on file  Social History Narrative   Not on file   Social Determinants of Health   Financial Resource Strain: Low Risk  (03/16/2022)   Overall Financial Resource Strain (CARDIA)    Difficulty of Paying Living Expenses: Not hard at all  Food Insecurity: No Food Insecurity (03/16/2022)   Hunger Vital Sign  Worried About Charity fundraiser in the Last Year: Never true    [place] in the Last Year: Never true  Transportation Needs: No Transportation Needs (03/16/2022)   PRAPARE - Hydrologist (Medical): No    Lack of Transportation (Non-Medical): No  Physical Activity: Insufficiently Active (03/16/2022)   Exercise Vital Sign    Days of Exercise per Week: 3 days    Minutes of Exercise per Session: 30 min  Stress: No Stress Concern Present (03/16/2022)   [place]    Feeling of Stress : Not at all  Social Connections: Moderately Integrated (03/16/2022)   Social Connection and Isolation Panel [NHANES]    Frequency of Communication with Friends and Family: More than three times a week    Frequency of Social Gatherings with Friends and Family: Never    Attends Religious Services: More than 4 times per year    Active Member of [employer] or Organizations: No    Attends Archivist Meetings: Never    Marital Status: Married  Human resources officer Violence: Not At Risk (03/16/2022)   Humiliation, Afraid, Rape, and Kick questionnaire    Fear of Current or Ex-Partner: No    Emotionally Abused: No    Physically Abused: No    Sexually Abused: No   Social History   Tobacco Use  Smoking Status Former   Types: Cigarettes   Quit date: 07/06/1995   Years  since quitting: 27.1  Smokeless Tobacco Former   Social History   Substance and Sexual Activity  Alcohol Use Not Currently   Comment: remote history, quit 20+ years ago (1 beer q 2-3 days)    Family History:  Family History  Problem Relation Age of Onset   Diabetes Mother    Alzheimer's disease Father    Diabetes Brother    Diabetes Maternal Grandfather     Past medical history, surgical history, medications, allergies, family history and social history reviewed with patient today and changes made to appropriate areas of the chart.   Review of Systems  Constitutional: Negative.   HENT: Negative.    Eyes:  Positive for blurred vision. Negative for double vision, photophobia, pain, discharge and redness.  Respiratory:  Positive for wheezing. Negative for cough, hemoptysis, sputum production and shortness of breath.   Cardiovascular:  Positive for leg swelling. Negative for chest pain, palpitations, orthopnea, claudication and PND.  Gastrointestinal:  Positive for diarrhea and heartburn. Negative for abdominal pain, blood in stool, constipation, melena, nausea and vomiting.  Genitourinary: Negative.  Negative for dysuria, flank pain, frequency, hematuria and urgency.       Hesitancy  Musculoskeletal:  Positive for joint pain (in his hands). Negative for back pain, falls, myalgias and neck pain.  Skin: Negative.   Neurological: Negative.   Endo/Heme/Allergies: Negative.   Psychiatric/Behavioral: Negative.     All other ROS negative except what is listed above and in the HPI.      Objective:    BP 112/69   Pulse 88   Temp 98.1 F (36.7 C) (Oral)   Ht _0  (1.6 m)   Wt 176 lb 11.2 oz (80.2 kg)   SpO2 95%   BMI 31.30 kg/m   Wt Readings from Last 3 Encounters:  08/29/22 176 lb 11.2 oz (80.2 kg)  02/07/22 174 lb 6.4 oz (79.1 kg)  08/09/21 175 lb 3.2 oz (79.5 kg)    Physical Exam Vitals and  nursing note reviewed.  Constitutional:      General: He is not in acute  distress.    Appearance: Normal appearance. He is obese. He is not ill-appearing, toxic-appearing or diaphoretic.  HENT:     Head: Normocephalic and atraumatic.     Right Ear: Tympanic membrane, ear canal and external ear normal. There is no impacted cerumen.     Left Ear: Tympanic membrane, ear canal and external ear normal. There is no impacted cerumen.     Nose: Nose normal. No congestion or rhinorrhea.     Mouth/Throat:     Mouth: Mucous membranes are moist.     Pharynx: Oropharynx is clear. No oropharyngeal exudate or posterior oropharyngeal erythema.  Eyes:     General: No scleral icterus.       Right eye: No discharge.        Left eye: No discharge.     Extraocular Movements: Extraocular movements intact.     Conjunctiva/sclera: Conjunctivae normal.     Pupils: Pupils are equal, round, and reactive to light.  Neck:     Vascular: No carotid bruit.  Cardiovascular:     Rate and Rhythm: Normal rate and regular rhythm.     Pulses: Normal pulses.     Heart sounds: No murmur heard.    No friction rub. No gallop.  Pulmonary:     Effort: Pulmonary effort is normal. No respiratory distress.     Breath sounds: Normal breath sounds. No stridor. No wheezing, rhonchi or rales.  Chest:     Chest wall: No tenderness.  Abdominal:     General: Abdomen is flat. Bowel sounds are normal. There is no distension.     Palpations: Abdomen is soft. There is no mass.     Tenderness: There is no abdominal tenderness. There is no right CVA tenderness, left CVA tenderness, guarding or rebound.     Hernia: No hernia is present.  Genitourinary:    Comments: Genital exam deferred with shared decision making Musculoskeletal:        General: No swelling, tenderness, deformity or signs of injury.     Cervical back: Normal range of motion and neck supple. No rigidity. No muscular tenderness.     Right lower leg: No edema.     Left lower leg: No edema.  Lymphadenopathy:     Cervical: No cervical  adenopathy.  Skin:    General: Skin is warm and dry.     Capillary Refill: Capillary refill takes less than 2 seconds.     Coloration: Skin is not jaundiced or pale.     Findings: No bruising, erythema, lesion or rash.  Neurological:     General: No focal deficit present.     Mental Status: He is alert and oriented to person, place, and time.     Cranial Nerves: No cranial nerve deficit.     Sensory: No sensory deficit.     Motor: No weakness.     Coordination: Coordination normal.     Gait: Gait normal.     Deep Tendon Reflexes: Reflexes normal.  Psychiatric:        Mood and Affect: Mood normal.        Behavior: Behavior normal.        Thought Content: Thought content normal.        Judgment: Judgment normal.     Results for orders placed or performed in visit on 02/07/22  Comprehensive metabolic panel  Result Value Ref Range  Glucose 128 (H) 70 - 99 mg/dL   BUN 22 8 - 27 mg/dL   Creatinine, Ser 1.06 0.76 - 1.27 mg/dL   eGFR 77 >59 mL/min/1.73   BUN/Creatinine Ratio 21 10 - 24   Sodium 137 134 - 144 mmol/L   Potassium 4.2 3.5 - 5.2 mmol/L   Chloride 103 96 - 106 mmol/L   CO2 20 20 - 29 mmol/L   Calcium 9.3 8.6 - 10.2 mg/dL   Total Protein 7.3 6.0 - 8.5 g/dL   Albumin 4.3 3.8 - 4.8 g/dL   Globulin, Total 3.0 1.5 - 4.5 g/dL   Albumin/Globulin Ratio 1.4 1.2 - 2.2   Bilirubin Total <0.2 0.0 - 1.2 mg/dL   Alkaline Phosphatase 51 44 - 121 IU/L   AST 23 0 - 40 IU/L   ALT 25 0 - 44 IU/L  CBC with Differential/Platelet  Result Value Ref Range   WBC 7.4 3.4 - 10.8 x10E3/uL   RBC 4.90 4.14 - 5.80 x10E6/uL   Hemoglobin 15.6 13.0 - 17.7 g/dL   Hematocrit 45.2 37.5 - 51.0 %   MCV 92 79 - 97 fL   MCH 31.8 26.6 - 33.0 pg   MCHC 34.5 31.5 - 35.7 g/dL   RDW 13.3 11.6 - 15.4 %   Platelets 278 150 - 450 x10E3/uL   Neutrophils 46 Not Estab. %   Lymphs 41 Not Estab. %   Monocytes 9 Not Estab. %   Eos 4 Not Estab. %   Basos 0 Not Estab. %   Neutrophils Absolute 3.4 1.4 - 7.0  x10E3/uL   Lymphocytes Absolute 3.0 0.7 - 3.1 x10E3/uL   Monocytes Absolute 0.6 0.1 - 0.9 x10E3/uL   EOS (ABSOLUTE) 0.3 0.0 - 0.4 x10E3/uL   Basophils Absolute 0.0 0.0 - 0.2 x10E3/uL   Immature Granulocytes 0 Not Estab. %   Immature Grans (Abs) 0.0 0.0 - 0.1 x10E3/uL  Lipid Panel w/o Chol/HDL Ratio  Result Value Ref Range   Cholesterol, Total 177 100 - 199 mg/dL   Triglycerides 485 (H) 0 - 149 mg/dL   HDL 37 (L) >39 mg/dL   VLDL Cholesterol Cal 75 (H) 5 - 40 mg/dL   LDL Chol Calc (NIH) 65 0 - 99 mg/dL  TSH  Result Value Ref Range   TSH 2.290 0.450 - 4.500 uIU/mL  Bayer DCA Hb A1c Waived  Result Value Ref Range   HB A1C (BAYER DCA - WAIVED) 5.4 4.8 - 5.6 %      Assessment & Plan:   Problem List Items Addressed This Visit       Cardiovascular and Mediastinum   Hypertension    Under good control on current regimen. Continue current regimen. Continue to monitor. Call with any concerns. Refills given. Labs drawn today.        Relevant Medications   lisinopril (ZESTRIL) 5 MG tablet   rosuvastatin (CRESTOR) 10 MG tablet   tadalafil (CIALIS) 20 MG tablet   Other Relevant Orders   Comprehensive metabolic panel   CBC with Differential/Platelet   Urinalysis, Routine w reflex microscopic   Urine Microalbumin w/creat. ratio     Endocrine   IFG (impaired fasting glucose)    Rechecking labs today. Await results. Treat as needed.       Relevant Orders   Comprehensive metabolic panel   CBC with Differential/Platelet   Hgb A1c w/o eAG   Hypothyroidism    Rechecking labs today. Await results. Treat as needed.       Relevant  Orders   Comprehensive metabolic panel   CBC with Differential/Platelet   TSH     Genitourinary   BPH with obstruction/lower urinary tract symptoms    Under good control on current regimen. Continue current regimen. Continue to monitor. Call with any concerns. Refills given. Labs drawn today.       Relevant Orders   PSA     Other   Anxiety     Under good control on current regimen. Continue current regimen. Continue to monitor. Call with any concerns. Refills given. Labs drawn today.       Depression    Under good control on current regimen. Continue current regimen. Continue to monitor. Call with any concerns. Refills given. Labs drawn today.       Hyperlipidemia    Under good control on current regimen. Continue current regimen. Continue to monitor. Call with any concerns. Refills given. Labs drawn today.       Relevant Medications   lisinopril (ZESTRIL) 5 MG tablet   rosuvastatin (CRESTOR) 10 MG tablet   tadalafil (CIALIS) 20 MG tablet   Other Relevant Orders   Comprehensive metabolic panel   CBC with Differential/Platelet   Lipid Panel w/o Chol/HDL Ratio   Other Visit Diagnoses     Routine general medical examination at a health care facility    -  Primary   Vaccines up to date. Screening labs checked today. Colonoscopy up to date. Continue diet and exercise. Call with any concerns.   Essential hypertension       Relevant Medications   lisinopril (ZESTRIL) 5 MG tablet   rosuvastatin (CRESTOR) 10 MG tablet   tadalafil (CIALIS) 20 MG tablet        LABORATORY TESTING:  Health maintenance labs ordered today as discussed above.   The natural history of prostate cancer and ongoing controversy regarding screening and potential treatment outcomes of prostate cancer has been discussed with the patient. The meaning of a false positive PSA and a false negative PSA has been discussed. He indicates understanding of the limitations of this screening test and wishes to proceed with screening PSA testing.   IMMUNIZATIONS:   - Tdap: Tetanus vaccination status reviewed: last tetanus booster within 10 years. - Influenza: Up to date - Pneumovax: Up to date - Prevnar: Up to date - COVID: Up to date - HPV: Not applicable - Shingrix vaccine: Given elsewhere  SCREENING: - Colonoscopy: Up to date  Discussed with patient  purpose of the colonoscopy is to detect colon cancer at curable precancerous or early stages   PATIENT COUNSELING:    Sexuality: Discussed sexually transmitted diseases, partner selection, use of condoms, avoidance of unintended pregnancy  and contraceptive alternatives.   Advised to avoid cigarette smoking.  I discussed with the patient that most people either abstain from alcohol or drink within safe limits (<=14/week and <=4 drinks/occasion for males, <=7/weeks and <= 3 drinks/occasion for females) and that the risk for alcohol disorders and other health effects rises proportionally with the number of drinks per week and how often a drinker exceeds daily limits.  Discussed cessation/primary prevention of drug use and availability of treatment for abuse.   Diet: Encouraged to adjust caloric intake to maintain  or achieve ideal body weight, to reduce intake of dietary saturated fat and total fat, to limit sodium intake by avoiding high sodium foods and not adding table salt, and to maintain adequate dietary potassium and calcium preferably from fresh fruits, vegetables, and low-fat dairy products.  stressed the importance of regular exercise  Injury prevention: Discussed safety belts, safety helmets, smoke detector, smoking near bedding or upholstery.   Dental health: Discussed importance of regular tooth brushing, flossing, and dental visits.   Follow up plan: NEXT PREVENTATIVE PHYSICAL DUE IN 1 YEAR. Return in about 6 months (around 02/28/2023).

## 2022-08-29 NOTE — Assessment & Plan Note (Signed)
Rechecking labs today. Await results. Treat as needed.  °

## 2022-08-30 ENCOUNTER — Encounter: Payer: Self-pay | Admitting: Family Medicine

## 2022-08-30 ENCOUNTER — Other Ambulatory Visit: Payer: Self-pay | Admitting: Family Medicine

## 2022-08-30 LAB — CBC WITH DIFFERENTIAL/PLATELET
Basophils Absolute: 0 10*3/uL (ref 0.0–0.2)
Basos: 0 %
EOS (ABSOLUTE): 0.2 10*3/uL (ref 0.0–0.4)
Eos: 3 %
Hematocrit: 43.9 % (ref 37.5–51.0)
Hemoglobin: 15.6 g/dL (ref 13.0–17.7)
Immature Grans (Abs): 0 10*3/uL (ref 0.0–0.1)
Immature Granulocytes: 0 %
Lymphocytes Absolute: 2.8 10*3/uL (ref 0.7–3.1)
Lymphs: 36 %
MCH: 33 pg (ref 26.6–33.0)
MCHC: 35.5 g/dL (ref 31.5–35.7)
MCV: 93 fL (ref 79–97)
Monocytes Absolute: 0.7 10*3/uL (ref 0.1–0.9)
Monocytes: 9 %
Neutrophils Absolute: 4.1 10*3/uL (ref 1.4–7.0)
Neutrophils: 52 %
Platelets: 299 10*3/uL (ref 150–450)
RBC: 4.73 x10E6/uL (ref 4.14–5.80)
RDW: 12.7 % (ref 11.6–15.4)
WBC: 7.8 10*3/uL (ref 3.4–10.8)

## 2022-08-30 LAB — LIPID PANEL W/O CHOL/HDL RATIO
Cholesterol, Total: 177 mg/dL (ref 100–199)
HDL: 42 mg/dL (ref >39)
LDL Chol Calc (NIH): 71 mg/dL (ref 0–99)
Triglycerides: 406 mg/dL — ABNORMAL HIGH (ref 0–149)
VLDL Cholesterol Cal: 64 mg/dL — ABNORMAL HIGH (ref 5–40)

## 2022-08-30 LAB — URINALYSIS, ROUTINE W REFLEX MICROSCOPIC
Bilirubin, UA: NEGATIVE
Glucose, UA: NEGATIVE
Ketones, UA: NEGATIVE
Leukocytes,UA: NEGATIVE
Nitrite, UA: NEGATIVE
Protein,UA: NEGATIVE
RBC, UA: NEGATIVE
Specific Gravity, UA: 1.018 (ref 1.005–1.030)
Urobilinogen, Ur: 0.2 mg/dL (ref 0.2–1.0)
pH, UA: 5.5 (ref 5.0–7.5)

## 2022-08-30 LAB — COMPREHENSIVE METABOLIC PANEL
ALT: 44 IU/L (ref 0–44)
AST: 52 IU/L — ABNORMAL HIGH (ref 0–40)
Albumin/Globulin Ratio: 1.2 (ref 1.2–2.2)
Albumin: 4.2 g/dL (ref 3.9–4.9)
Alkaline Phosphatase: 49 IU/L (ref 44–121)
BUN/Creatinine Ratio: 15 (ref 10–24)
BUN: 18 mg/dL (ref 8–27)
Bilirubin Total: <0.2 mg/dL (ref 0.0–1.2)
CO2: 19 mmol/L — ABNORMAL LOW (ref 20–29)
Calcium: 9.3 mg/dL (ref 8.6–10.2)
Chloride: 103 mmol/L (ref 96–106)
Creatinine, Ser: 1.19 mg/dL (ref 0.76–1.27)
Globulin, Total: 3.4 g/dL (ref 1.5–4.5)
Glucose: 132 mg/dL — ABNORMAL HIGH (ref 70–99)
Potassium: 4.6 mmol/L (ref 3.5–5.2)
Sodium: 139 mmol/L (ref 134–144)
Total Protein: 7.6 g/dL (ref 6.0–8.5)
eGFR: 67 mL/min/{1.73_m2} (ref >59)

## 2022-08-30 LAB — MICROALBUMIN / CREATININE URINE RATIO
Creatinine, Urine: 94 mg/dL
Microalb/Creat Ratio: 6 mg/g creat (ref 0–29)
Microalbumin, Urine: 5.7 ug/mL

## 2022-08-30 LAB — PSA: Prostate Specific Ag, Serum: 1 ng/mL (ref 0.0–4.0)

## 2022-08-30 LAB — TSH: TSH: 4.13 u[IU]/mL (ref 0.450–4.500)

## 2022-08-30 LAB — HGB A1C W/O EAG: Hgb A1c MFr Bld: 6.3 % — ABNORMAL HIGH (ref 4.8–5.6)

## 2022-08-30 MED ORDER — LEVOTHYROXINE SODIUM 50 MCG PO TABS: 50.0000 ug | ORAL_TABLET | Freq: Every day | ORAL | 3 refills | Status: DC

## 2022-09-05 ENCOUNTER — Telehealth: Payer: Self-pay

## 2022-09-05 NOTE — Telephone Encounter (Signed)
Pt called - He has not yet received his lab results in the mail. Pt would like another copy sent.

## 2022-09-06 NOTE — Telephone Encounter (Signed)
Letter has been resent

## 2022-11-08 ENCOUNTER — Other Ambulatory Visit: Payer: Self-pay | Admitting: Family Medicine

## 2022-11-09 NOTE — Telephone Encounter (Signed)
Unable to refill per protocol, Rx request is too soon. Last refill 08/29/22 for 90 and 1 refill.  Requested Prescriptions  Pending Prescriptions Disp Refills   rosuvastatin (CRESTOR) 10 MG tablet [Pharmacy Med Name: Rosuvastatin Calcium 10 MG Oral Tablet] 100 tablet 2    Sig: TAKE 1 TABLET BY MOUTH DAILY     Cardiovascular:  Antilipid - Statins 2 Failed - 11/08/2022 10:32 PM      Failed - Lipid Panel in normal range within the last 12 months    Cholesterol, Total  Date Value Ref Range Status  08/29/2022 177 100 - 199 mg/dL Final   Cholesterol Piccolo, Waived  Date Value Ref Range Status  03/31/2016 155 <200 mg/dL Final    Comment:                            Desirable                <200                         Borderline High      200- 239                         High                     >239    LDL Chol Calc (NIH)  Date Value Ref Range Status  08/29/2022 71 0 - 99 mg/dL Final   HDL  Date Value Ref Range Status  08/29/2022 42 >39 mg/dL Final   Triglycerides  Date Value Ref Range Status  08/29/2022 406 (H) 0 - 149 mg/dL Final   Triglycerides Piccolo,Waived  Date Value Ref Range Status  03/31/2016 338 (H) <150 mg/dL Final    Comment:                            Normal                   <150                         Borderline High     150 - 199                         High                200 - 499                         Very High                >499          Passed - Cr in normal range and within 360 days    Creatinine  Date Value Ref Range Status  09/07/2012 1.20 0.60 - 1.30 mg/dL Final   Creatinine, Ser  Date Value Ref Range Status  08/29/2022 1.19 0.76 - 1.27 mg/dL Final         Passed - Patient is not pregnant      Passed - Valid encounter within last 12 months    Recent Outpatient Visits           2 months ago Routine general medical examination at a health care facility  [place], [place] P, DO   9 months ago IFG  (impaired fasting glucose)   [place] [facility] [place], [place], DO   1 year ago Routine general medical examination at a health care facility   [place], [place], DO   1 year ago Primary hypertension   [place], [place], DO   2 years ago COVID-57   [place], Barb Merino, DO       Future Appointments             In 1 week Ralene Bathe, MD [place]   In 3 months Valerie Roys, DO [place], [facility]

## 2022-11-13 ENCOUNTER — Other Ambulatory Visit: Payer: Self-pay | Admitting: Family Medicine

## 2022-11-13 DIAGNOSIS — I10 Essential (primary) hypertension: Secondary | ICD-10-CM

## 2022-11-21 ENCOUNTER — Ambulatory Visit: Payer: No Typology Code available for payment source | Admitting: Dermatology

## 2022-11-21 DIAGNOSIS — L820 Inflamed seborrheic keratosis: Secondary | ICD-10-CM | POA: Diagnosis not present

## 2022-11-21 DIAGNOSIS — L570 Actinic keratosis: Secondary | ICD-10-CM

## 2022-11-21 DIAGNOSIS — L578 Other skin changes due to chronic exposure to nonionizing radiation: Secondary | ICD-10-CM

## 2022-11-21 DIAGNOSIS — L821 Other seborrheic keratosis: Secondary | ICD-10-CM | POA: Diagnosis not present

## 2022-11-21 DIAGNOSIS — L814 Other melanin hyperpigmentation: Secondary | ICD-10-CM

## 2022-11-21 NOTE — Patient Instructions (Addendum)
Actinic keratoses are precancerous spots that appear secondary to cumulative UV radiation exposure/sun exposure over time. They are chronic with expected duration over 1 year. A portion of actinic keratoses will progress to squamous cell carcinoma of the skin. It is not possible to reliably predict which spots will progress to skin cancer and so treatment is recommended to prevent development of skin cancer.  Recommend daily broad spectrum sunscreen SPF 30+ to sun-exposed areas, reapply every 2 hours as needed.  Recommend staying in the shade or wearing long sleeves, sun glasses (UVA+UVB protection) and wide brim hats (4-inch brim around the entire circumference of the hat). Call for new or changing lesions.    Seborrheic Keratosis  What causes seborrheic keratoses? Seborrheic keratoses are harmless, common skin growths that first appear during adult life.  As time goes by, more growths appear.  Some people may develop a large number of them.  Seborrheic keratoses appear on both covered and uncovered body parts.  They are not caused by sunlight.  The tendency to develop seborrheic keratoses can be inherited.  They vary in color from skin-colored to gray, brown, or even black.  They can be either smooth or have a rough, warty surface.   Seborrheic keratoses are superficial and look as if they were stuck on the skin.  Under the microscope this type of keratosis looks like layers upon layers of skin.  That is why at times the top layer may seem to fall off, but the rest of the growth remains and re-grows.    Treatment Seborrheic keratoses do not need to be treated, but can easily be removed in the office.  Seborrheic keratoses often cause symptoms when they rub on clothing or jewelry.  Lesions can be in the way of shaving.  If they become inflamed, they can cause itching, soreness, or burning.  Removal of a seborrheic keratosis can be accomplished by freezing, burning, or surgery. If any spot bleeds,  scabs, or grows rapidly, please return to have it checked, as these can be an indication of a skin cancer.  Cryotherapy Aftercare  Wash gently with soap and water everyday.   Apply Vaseline and Band-Aid daily until healed.   Due to recent changes in healthcare laws, you may see results of your pathology and/or laboratory studies on MyChart before the doctors have had a chance to review them. We understand that in some cases there may be results that are confusing or concerning to you. Please understand that not all results are received at the same time and often the doctors may need to interpret multiple results in order to provide you with the best plan of care or course of treatment. Therefore, we ask that you please give [place] 2 business days to thoroughly review all your results before contacting the office for clarification. Should we see a critical lab result, you will be contacted sooner.   If You Need Anything After Your Visit  If you have any questions or concerns for your doctor, please call our main line at [phone] and press option 4 to reach your doctor's medical assistant. If no one answers, please leave a voicemail as directed and we will return your call as soon as possible. Messages left after 4 pm will be answered the following business day.   You may also send [place] a message via [place]. We typically respond to MyChart messages within 1-2 business days.  For prescription refills, please ask your pharmacy to contact our office. Our fax number  is [phone].  If you have an urgent issue when the clinic is closed that cannot wait until the next business day, you can page your doctor at the number below.    Please note that while we do our best to be available for urgent issues outside of office hours, we are not available 24/7.   If you have an urgent issue and are unable to reach us, you may choose to seek medical care at your doctor's office, retail clinic, urgent care  center, or emergency room.  If you have a medical emergency, please immediately call 911 or go to the emergency department.  Pager Numbers  - Dr. Kowalski: [phone]  - Dr. Moye: [phone]  - Dr. Stewart: [phone]  In the event of inclement weather, please call our main line at [phone] for an update on the status of any delays or closures.  Dermatology Medication Tips: Please keep the boxes that topical medications come in in order to help keep track of the instructions about where and how to use these. Pharmacies typically print the medication instructions only on the boxes and not directly on the medication tubes.   If your medication is too expensive, please contact our office at [phone] option 4 or send us a message through MyChart.   We are unable to tell what your co-pay for medications will be in advance as this is different depending on your insurance coverage. However, we may be able to find a substitute medication at lower cost or fill out paperwork to get insurance to cover a needed medication.   If a prior authorization is required to get your medication covered by your insurance company, please allow us 1-2 business days to complete this process.  Drug prices often vary depending on where the prescription is filled and some pharmacies may offer cheaper prices.  The website www.goodrx.com contains coupons for medications through different pharmacies. The prices here do not account for what the cost may be with help from insurance (it may be cheaper with your insurance), but the website can give you the price if you did not use any insurance.  - You can print the associated coupon and take it with your prescription to the pharmacy.  - You may also stop by our office during regular business hours and pick up a GoodRx coupon card.  - If you need your prescription sent electronically to a different pharmacy, notify our office through [place] MyChart or by  phone at [phone] option 4.     Si Usted Necesita Algo Despus de Su Visita  Tambin puede enviarnos un mensaje a travs de MyChart. Por lo general respondemos a los mensajes de MyChart en el transcurso de 1 a 2 das hbiles.  Para renovar recetas, por favor pida a su farmacia que se ponga en contacto con nuestra oficina. Nuestro nmero de fax es el [phone].  Si tiene un asunto urgente cuando la clnica est cerrada y que no puede esperar hasta el siguiente da hbil, puede llamar/localizar a su doctor(a) al nmero que aparece a continuacin.   Por favor, tenga en cuenta que aunque hacemos todo lo posible para estar disponibles para asuntos urgentes fuera del horario de oficina, no estamos disponibles las 24 horas del da, los 7 das de la semana.   Si tiene un problema urgente y no puede comunicarse con nosotros, puede optar por buscar atencin mdica  en el consultorio de su doctor(a), en una clnica privada, en un centro de atencin   urgente o en una sala de emergencias.  Si tiene una emergencia mdica, por favor llame inmediatamente al 911 o vaya a la sala de emergencias.  Nmeros de bper  - Dr. Kowalski: [phone]  - Dra. Moye: [phone]  - Dra. Stewart: [phone]  En caso de inclemencias del tiempo, por favor llame a nuestra lnea principal al [phone] para una actualizacin sobre el estado de cualquier retraso o cierre.  Consejos para la medicacin en dermatologa: Por favor, guarde las cajas en las que vienen los medicamentos de uso tpico para ayudarle a seguir las instrucciones sobre dnde y cmo usarlos. Las farmacias generalmente imprimen las instrucciones del medicamento slo en las cajas y no directamente en los tubos del medicamento.   Si su medicamento es muy caro, por favor, pngase en contacto con nuestra oficina llamando al [phone] y presione la opcin 4 o envenos un mensaje a travs de MyChart.   No podemos decirle cul ser su copago  por los medicamentos por adelantado ya que esto es diferente dependiendo de la cobertura de su seguro. Sin embargo, es posible que podamos encontrar un medicamento sustituto a menor costo o llenar un formulario para que el seguro cubra el medicamento que se considera necesario.   Si se requiere una autorizacin previa para que su compaa de seguros cubra su medicamento, por favor permtanos de 1 a 2 das hbiles para completar este proceso.  Los precios de los medicamentos varan con frecuencia dependiendo del lugar de dnde se surte la receta y alguna farmacias pueden ofrecer precios ms baratos.  El sitio web www.goodrx.com tiene cupones para medicamentos de diferentes farmacias. Los precios aqu no tienen en cuenta lo que podra costar con la ayuda del seguro (puede ser ms barato con su seguro), pero el sitio web puede darle el precio si no utiliz ningn seguro.  - Puede imprimir el cupn correspondiente y llevarlo con su receta a la farmacia.  - Tambin puede pasar por nuestra oficina durante el horario de atencin regular y recoger una tarjeta de cupones de GoodRx.  - Si necesita que su receta se enve electrnicamente a una farmacia diferente, informe a nuestra oficina a travs de MyChart de [place] o por telfono llamando al [phone] y presione la opcin 4.  

## 2022-11-21 NOTE — Progress Notes (Signed)
Follow-Up Visit   Subjective  Mitchell [name] is a 67 y.o. male who presents for the following: Follow-up (Ak and isk follow up, reports spots at back, chest, shoulders, head, arms. ). The patient has spots, moles and lesions to be evaluated, some may be new or changing and the patient has concerns that these could be cancer.  The following portions of the chart were reviewed this encounter and updated as appropriate:  Tobacco  Allergies  Meds  Problems  Med Hx  Surg Hx  Fam Hx     Review of Systems: No other skin or systemic complaints except as noted in HPI or Assessment and Plan.  Objective  Well appearing patient in no apparent distress; mood and affect are within normal limits.  All sun exposed areas plus back examined.  back x 25 (25) Erythematous stuck-on, waxy papule or plaque  left face x 1 Erythematous thin papules/macules with gritty scale.    Assessment & Plan  Inflamed seborrheic keratosis (25) back x 25 Symptomatic, irritating, patient would like treated. Destruction of lesion - back x 25 Complexity: simple   Destruction method: cryotherapy   Informed consent: discussed and consent obtained   Timeout:  patient name, date of birth, surgical site, and procedure verified Lesion destroyed using liquid nitrogen: Yes   Region frozen until ice ball extended beyond lesion: Yes   Outcome: patient tolerated procedure well with no complications   Post-procedure details: wound care instructions given   Additional details:  Prior to procedure, discussed risks of blister formation, small wound, skin dyspigmentation, or rare scar following cryotherapy. Recommend Vaseline ointment to treated areas while healing.  Actinic keratosis left face x 1 Actinic keratoses are precancerous spots that appear secondary to cumulative UV radiation exposure/sun exposure over time. They are chronic with expected duration over 1 year. A portion of actinic keratoses will progress to  squamous cell carcinoma of the skin. It is not possible to reliably predict which spots will progress to skin cancer and so treatment is recommended to prevent development of skin cancer.  Recommend daily broad spectrum sunscreen SPF 30+ to sun-exposed areas, reapply every 2 hours as needed.  Recommend staying in the shade or wearing long sleeves, sun glasses (UVA+UVB protection) and wide brim hats (4-inch brim around the entire circumference of the hat). Call for new or changing lesions.  Destruction of lesion - left face x 1 Complexity: simple   Destruction method: cryotherapy   Informed consent: discussed and consent obtained   Timeout:  patient name, date of birth, surgical site, and procedure verified Lesion destroyed using liquid nitrogen: Yes   Region frozen until ice ball extended beyond lesion: Yes   Outcome: patient tolerated procedure well with no complications   Post-procedure details: wound care instructions given   Additional details:  Prior to procedure, discussed risks of blister formation, small wound, skin dyspigmentation, or rare scar following cryotherapy. Recommend Vaseline ointment to treated areas while healing.  Seborrheic Keratoses - Stuck-on, waxy, tan-brown papules and/or plaques  - Benign-appearing - Discussed benign etiology and prognosis. - Observe - Call for any changes  Lentigines - Scattered tan macules - Due to sun exposure - Benign-appearing, observe - Recommend daily broad spectrum sunscreen SPF 30+ to sun-exposed areas, reapply every 2 hours as needed. - Call for any changes  Actinic Damage - chronic, secondary to cumulative UV radiation exposure/sun exposure over time - diffuse scaly erythematous macules with underlying dyspigmentation - Recommend daily broad spectrum sunscreen SPF 30+  to sun-exposed areas, reapply every 2 hours as needed.  - Recommend staying in the shade or wearing long sleeves, sun glasses (UVA+UVB protection) and wide brim  hats (4-inch brim around the entire circumference of the hat). - Call for new or changing lesions.  Return for 6 month isk and ak follow up.  IRuthell Rummage, CMA, am acting as scribe for Sarina Ser, MD. Documentation: I have reviewed the above documentation for accuracy and completeness, and I agree with the above.  Sarina Ser, MD

## 2022-11-26 ENCOUNTER — Encounter: Payer: Self-pay | Admitting: Dermatology

## 2022-11-30 ENCOUNTER — Other Ambulatory Visit: Payer: Self-pay

## 2022-11-30 NOTE — Telephone Encounter (Signed)
Pt called to report that he was told to contact his PCP and share this provider line [phone]

## 2022-12-29 ENCOUNTER — Other Ambulatory Visit: Payer: Self-pay | Admitting: Family Medicine

## 2022-12-30 NOTE — Telephone Encounter (Signed)
Requested Prescriptions  Pending Prescriptions Disp Refills   rosuvastatin (CRESTOR) 10 MG tablet [Pharmacy Med Name: Rosuvastatin Calcium 10 MG Oral Tablet] 80 tablet 3    Sig: TAKE 1 TABLET BY MOUTH DAILY     Cardiovascular:  Antilipid - Statins 2 Failed - 12/29/2022 10:34 PM      Failed - Lipid Panel in normal range within the last 12 months    Cholesterol, Total  Date Value Ref Range Status  08/29/2022 177 100 - 199 mg/dL Final   Cholesterol Piccolo, Waived  Date Value Ref Range Status  03/31/2016 155 <200 mg/dL Final    Comment:                            Desirable                <200                         Borderline High      200- 239                         High                     >239    LDL Chol Calc (NIH)  Date Value Ref Range Status  08/29/2022 71 0 - 99 mg/dL Final   HDL  Date Value Ref Range Status  08/29/2022 42 >39 mg/dL Final   Triglycerides  Date Value Ref Range Status  08/29/2022 406 (H) 0 - 149 mg/dL Final   Triglycerides Piccolo,Waived  Date Value Ref Range Status  03/31/2016 338 (H) <150 mg/dL Final    Comment:                            Normal                   <150                         Borderline High     150 - 199                         High                200 - 499                         Very High                >499          Passed - Cr in normal range and within 360 days    Creatinine  Date Value Ref Range Status  09/07/2012 1.20 0.60 - 1.30 mg/dL Final   Creatinine, Ser  Date Value Ref Range Status  08/29/2022 1.19 0.76 - 1.27 mg/dL Final         Passed - Patient is not pregnant      Passed - Valid encounter within last 12 months    Recent Outpatient Visits           4 months ago Routine general medical examination at a health care facility   [facility], Megan P, DO   10 months ago IFG (impaired fasting  glucose)   [facility] [place], [place] P, DO   1 year ago  Routine general medical examination at a health care facility   [facility] [address], [place] P, DO   1 year ago Primary hypertension   [facility] [place], Watson, DO   2 years ago COVID-19   [facility] Dorcas Carrow, DO       Future Appointments             In 2 months Dorcas Carrow, DO  [facility]   In 5 months Deirdre Evener, MD [facility]

## 2023-02-21 ENCOUNTER — Ambulatory Visit: Payer: Self-pay | Admitting: *Deleted

## 2023-02-21 NOTE — Telephone Encounter (Signed)
Left message for patient to make him aware of Dr Henriette Combs recommendations. Advised patient to give our office a call back if he has any questions or concerns regarding message.

## 2023-02-21 NOTE — Telephone Encounter (Signed)
rx concern   The patient shares that they are uncertain if they took their levothyroxine (SYNTHROID) 50 MCG tablet [[id number]] this morning  The patient would like to discuss the time that they take their medications and if they would be able to take this medication later today  The patient feels fine at the time of call  Please contact further when possible     Chief Complaint: Medication Question Symptoms: NA Frequency: NA Pertinent Negatives: Patient denies NA Disposition: [] ED /[] Urgent Care (no appt availability in office) / [] Appointment(In office/virtual)/ []  [place] [facility]/ [] Home Care/ [] Refused Recommended Disposition /[] [place] [facility]/ [x]  Follow-up with PCP Additional Notes: Pt states he thinks he took his levothyroxine but not entirely sure. Advised to wait until AM and resume med schedule instead of double dosing. Pt verbalizes understanding.  Pt also reports both hands "Hurt at the finger joints" x 3-4 years. Asking if could be from Crestor. Offered appt, "I just deal with it."  Assured pt NT would route to practice for PCPs review.  Also pt would like any resource materials available from office regarding diet modification to help lower cholesterol.  Please advise.   Reason for Disposition  Caller has medicine question only, adult not sick, AND triager answers question  Answer Assessment - Initial Assessment Questions 1. NAME of MEDICINE: "What medicine(s) are you calling about?"     Levothyroxine 2. QUESTION: "What is your question?" (e.g., double dose of medicine, side effect)     I don't think I took it this AM  but not sure.  Protocols used: Medication Question Call-A-AH

## 2023-02-21 NOTE — Telephone Encounter (Signed)
OK to skip today and restart tomorrow

## 2023-02-28 ENCOUNTER — Encounter: Payer: Self-pay | Admitting: Family Medicine

## 2023-02-28 ENCOUNTER — Ambulatory Visit (INDEPENDENT_AMBULATORY_CARE_PROVIDER_SITE_OTHER): Payer: Medicare Other | Admitting: Family Medicine

## 2023-02-28 VITALS — BP 128/75 | HR 77 | Temp 98.7°F | Ht 63.0 in | Wt 173.0 lb

## 2023-02-28 DIAGNOSIS — E782 Mixed hyperlipidemia: Secondary | ICD-10-CM

## 2023-02-28 DIAGNOSIS — E038 Other specified hypothyroidism: Secondary | ICD-10-CM | POA: Diagnosis not present

## 2023-02-28 DIAGNOSIS — R7301 Impaired fasting glucose: Secondary | ICD-10-CM

## 2023-02-28 DIAGNOSIS — I10 Essential (primary) hypertension: Secondary | ICD-10-CM | POA: Diagnosis not present

## 2023-02-28 DIAGNOSIS — F331 Major depressive disorder, recurrent, moderate: Secondary | ICD-10-CM

## 2023-02-28 DIAGNOSIS — F419 Anxiety disorder, unspecified: Secondary | ICD-10-CM

## 2023-02-28 LAB — BAYER DCA HB A1C WAIVED: HB A1C (BAYER DCA - WAIVED): 6.2 % — ABNORMAL HIGH (ref 4.8–5.6)

## 2023-02-28 MED ORDER — TADALAFIL 20 MG PO TABS: ORAL_TABLET | ORAL | 12 refills | Status: DC

## 2023-02-28 MED ORDER — HYDROXYZINE HCL 25 MG PO TABS: 25.0000 mg | ORAL_TABLET | Freq: Three times a day (TID) | ORAL | 1 refills | Status: DC | PRN

## 2023-02-28 MED ORDER — LISINOPRIL 5 MG PO TABS: 7.5000 mg | ORAL_TABLET | Freq: Every day | ORAL | 2 refills | Status: DC

## 2023-02-28 MED ORDER — FLOVENT HFA 220 MCG/ACT IN AERO: 2.0000 | INHALATION_SPRAY | Freq: Every day | RESPIRATORY_TRACT | 3 refills | Status: DC

## 2023-02-28 MED ORDER — ROSUVASTATIN CALCIUM 10 MG PO TABS: 10.0000 mg | ORAL_TABLET | Freq: Every day | ORAL | 3 refills | Status: DC

## 2023-02-28 MED ORDER — MELOXICAM 7.5 MG PO TABS
7.5000 mg | ORAL_TABLET | Freq: Every day | ORAL | 1 refills | Status: DC
Start: 2023-02-28 — End: 2023-08-31

## 2023-02-28 NOTE — Assessment & Plan Note (Signed)
Under good control on current regimen. Continue current regimen. Continue to monitor. Call with any concerns. Refills given.   

## 2023-02-28 NOTE — Assessment & Plan Note (Signed)
Rechecking labs today. Await results. Treat as needed.  °

## 2023-02-28 NOTE — Assessment & Plan Note (Signed)
Under good control on current regimen. Continue current regimen. Continue to monitor. Call with any concerns. Refills given. Labs drawn today.   

## 2023-02-28 NOTE — Progress Notes (Signed)
BP 128/75   Pulse 77   Temp 98.7 F (37.1 C) (Oral)   Ht 5\' 3"  (1.6 m)   Wt 173 lb (78.5 kg)   SpO2 95%   BMI 30.65 kg/m    Subjective:    Patient ID: Mitchell [name], male    DOB: 1956/03/04, 67 y.o.   MRN: [id number]  HPI: Mitchell [name] is a 67 y.o. male  Chief Complaint  Patient presents with   Hypertension   Hyperlipidemia   IFG   Impaired Fasting Glucose HbA1C:  Lab Results  Component Value Date   HGBA1C 6.3 (H) 08/29/2022   Duration of elevated blood sugar: chronic Polydipsia: no Polyuria: no Weight change: no Visual disturbance: no Glucose Monitoring: no    Accucheck frequency: Not Checking Diabetic Education: Completed Family history of diabetes: no   HYPERTENSION / HYPERLIPIDEMIA Satisfied with current treatment? yes Duration of hypertension: chronic BP monitoring frequency: not checking BP medication side effects: no Past BP meds: lisinopril Duration of hyperlipidemia: chronic Cholesterol medication side effects: no Cholesterol supplements: none Past cholesterol medications: crestor Medication compliance: excellent compliance Aspirin: no Recent stressors: yes Recurrent headaches: no Visual changes: no Palpitations: no Dyspnea: no Chest pain: no Lower extremity edema: no Dizzy/lightheaded: no  ANXIETY/DEPRESSION Duration: chronic Status:stable Anxious mood: no  Excessive worrying: no Irritability: no  Sweating: no Nausea: no Palpitations:no Hyperventilation: no Panic attacks: no Agoraphobia: no  Obscessions/compulsions: no Depressed mood: no    08/29/2022    3:35 PM 03/16/2022   12:08 PM 02/07/2022    3:43 PM 08/09/2021    4:01 PM 02/05/2021    3:55 PM  Depression screen PHQ 2/9  Decreased Interest 0 1 0 0 0  Down, Depressed, Hopeless 0 1 0 1 0  PHQ - 2 Score 0 2 0 1 0  Altered sleeping 0 0 0 1 0  Tired, decreased energy 0 0 0 1 1  Change in appetite 0 0 0 0 1  Feeling bad or failure about yourself  0 0 0 0 0  Trouble  concentrating 0 0 0 0 0  Moving slowly or fidgety/restless 2 0 0 0 0  Suicidal thoughts 0 0 0 0 0  PHQ-9 Score 2 2 0 3 2  Difficult doing work/chores Not difficult at all Not difficult at all   Not difficult at all   Anhedonia: no Weight changes: no Insomnia: no  Hypersomnia: no Fatigue/loss of energy: no Feelings of worthlessness: no Feelings of guilt: no Impaired concentration/indecisiveness: no Suicidal ideations: no  Crying spells: no Recent Stressors/Life Changes: no   Relationship problems: no   Family stress: no     Financial stress: no    Job stress: no    Recent death/loss: no  Relevant past medical, surgical, family and social history reviewed and updated as indicated. Interim medical history since our last visit reviewed. Allergies and medications reviewed and updated.  Review of Systems  Constitutional: Negative.   Respiratory: Negative.    Cardiovascular:  Positive for leg swelling. Negative for chest pain and palpitations.  Gastrointestinal: Negative.   Musculoskeletal:  Positive for arthralgias. Negative for back pain, gait problem, joint swelling, myalgias, neck pain and neck stiffness.  Skin: Negative.   Neurological: Negative.   Psychiatric/Behavioral: Negative.      Per HPI unless specifically indicated above     Objective:    BP 128/75   Pulse 77   Temp 98.7 F (37.1 C) (Oral)   Ht 5\' 3"  (1.6 m)  Wt 173 lb (78.5 kg)   SpO2 95%   BMI 30.65 kg/m   Wt Readings from Last 3 Encounters:  02/28/23 173 lb (78.5 kg)  08/29/22 176 lb 11.2 oz (80.2 kg)  02/07/22 174 lb 6.4 oz (79.1 kg)    Physical Exam Vitals and nursing note reviewed.  Constitutional:      General: He is not in acute distress.    Appearance: Normal appearance. He is not ill-appearing, toxic-appearing or diaphoretic.  HENT:     Head: Normocephalic and atraumatic.     Right Ear: External ear normal.     Left Ear: External ear normal.     Nose: Nose normal.     Mouth/Throat:      Mouth: Mucous membranes are moist.     Pharynx: Oropharynx is clear.  Eyes:     General: No scleral icterus.       Right eye: No discharge.        Left eye: No discharge.     Extraocular Movements: Extraocular movements intact.     Conjunctiva/sclera: Conjunctivae normal.     Pupils: Pupils are equal, round, and reactive to light.  Cardiovascular:     Rate and Rhythm: Normal rate and regular rhythm.     Pulses: Normal pulses.     Heart sounds: Normal heart sounds. No murmur heard.    No friction rub. No gallop.  Pulmonary:     Effort: Pulmonary effort is normal. No respiratory distress.     Breath sounds: Normal breath sounds. No stridor. No wheezing, rhonchi or rales.  Chest:     Chest wall: No tenderness.  Musculoskeletal:        General: Normal range of motion.     Cervical back: Normal range of motion and neck supple.  Skin:    General: Skin is warm and dry.     Capillary Refill: Capillary refill takes less than 2 seconds.     Coloration: Skin is not jaundiced or pale.     Findings: No bruising, erythema, lesion or rash.  Neurological:     General: No focal deficit present.     Mental Status: He is alert and oriented to person, place, and time. Mental status is at baseline.  Psychiatric:        Mood and Affect: Mood normal.        Behavior: Behavior normal.        Thought Content: Thought content normal.        Judgment: Judgment normal.     Results for orders placed or performed in visit on 08/29/22  Comprehensive metabolic panel  Result Value Ref Range   Glucose 132 (H) 70 - 99 mg/dL   BUN 18 8 - 27 mg/dL   Creatinine, Ser 2.95 0.76 - 1.27 mg/dL   eGFR 67 >62 ZH/YQM/5.78   BUN/Creatinine Ratio 15 10 - 24   Sodium 139 134 - 144 mmol/L   Potassium 4.6 3.5 - 5.2 mmol/L   Chloride 103 96 - 106 mmol/L   CO2 19 (L) 20 - 29 mmol/L   Calcium 9.3 8.6 - 10.2 mg/dL   Total Protein 7.6 6.0 - 8.5 g/dL   Albumin 4.2 3.9 - 4.9 g/dL   Globulin, Total 3.4 1.5 - 4.5 g/dL    Albumin/Globulin Ratio 1.2 1.2 - 2.2   Bilirubin Total <0.2 0.0 - 1.2 mg/dL   Alkaline Phosphatase 49 44 - 121 IU/L   AST 52 (H) 0 - 40 IU/L   ALT  44 0 - 44 IU/L  CBC with Differential/Platelet  Result Value Ref Range   WBC 7.8 3.4 - 10.8 x10E3/uL   RBC 4.73 4.14 - 5.80 x10E6/uL   Hemoglobin 15.6 13.0 - 17.7 g/dL   Hematocrit 16.1 09.6 - 51.0 %   MCV 93 79 - 97 fL   MCH 33.0 26.6 - 33.0 pg   MCHC 35.5 31.5 - 35.7 g/dL   RDW 04.5 40.9 - 81.1 %   Platelets 299 150 - 450 x10E3/uL   Neutrophils 52 Not Estab. %   Lymphs 36 Not Estab. %   Monocytes 9 Not Estab. %   Eos 3 Not Estab. %   Basos 0 Not Estab. %   Neutrophils Absolute 4.1 1.4 - 7.0 x10E3/uL   Lymphocytes Absolute 2.8 0.7 - 3.1 x10E3/uL   Monocytes Absolute 0.7 0.1 - 0.9 x10E3/uL   EOS (ABSOLUTE) 0.2 0.0 - 0.4 x10E3/uL   Basophils Absolute 0.0 0.0 - 0.2 x10E3/uL   Immature Granulocytes 0 Not Estab. %   Immature Grans (Abs) 0.0 0.0 - 0.1 x10E3/uL  Lipid Panel w/o Chol/HDL Ratio  Result Value Ref Range   Cholesterol, Total 177 100 - 199 mg/dL   Triglycerides 914 (H) 0 - 149 mg/dL   HDL 42 >78 mg/dL   VLDL Cholesterol Cal 64 (H) 5 - 40 mg/dL   LDL Chol Calc (NIH) 71 0 - 99 mg/dL  PSA  Result Value Ref Range   Prostate Specific Ag, Serum 1.0 0.0 - 4.0 ng/mL  TSH  Result Value Ref Range   TSH 4.130 0.450 - 4.500 uIU/mL  Urinalysis, Routine w reflex microscopic  Result Value Ref Range   Specific Gravity, UA 1.018 1.005 - 1.030   pH, UA 5.5 5.0 - 7.5   Color, UA Yellow Yellow   Appearance Ur Clear Clear   Leukocytes,UA Negative Negative   Protein,UA Negative Negative/Trace   Glucose, UA Negative Negative   Ketones, UA Negative Negative   RBC, UA Negative Negative   Bilirubin, UA Negative Negative   Urobilinogen, Ur 0.2 0.2 - 1.0 mg/dL   Nitrite, UA Negative Negative   Microscopic Examination Comment   Urine Microalbumin w/creat. ratio  Result Value Ref Range   Creatinine, Urine 94.0 Not Estab. mg/dL    Microalbumin, Urine 5.7 Not Estab. ug/mL   Microalb/Creat Ratio 6 0 - 29 mg/g creat  Hgb A1c w/o eAG  Result Value Ref Range   Hgb A1c MFr Bld 6.3 (H) 4.8 - 5.6 %      Assessment & Plan:   Problem List Items Addressed This Visit       Cardiovascular and Mediastinum   Hypertension - Primary    Under good control on current regimen. Continue current regimen. Continue to monitor. Call with any concerns. Refills given. Labs drawn today.       Relevant Medications   tadalafil (CIALIS) 20 MG tablet   lisinopril (ZESTRIL) 5 MG tablet   rosuvastatin (CRESTOR) 10 MG tablet   Other Relevant Orders   CBC with Differential/Platelet   Comprehensive metabolic panel     Endocrine   IFG (impaired fasting glucose)    Rechecking labs today. Await results. Treat as needed.       Relevant Orders   Bayer DCA Hb A1c Waived   CBC with Differential/Platelet   Comprehensive metabolic panel   Hypothyroidism    Rechecking labs today. Await results. Treat as needed.       Relevant Orders   CBC with Differential/Platelet  Comprehensive metabolic panel     Other   Anxiety    Under good control on current regimen. Continue current regimen. Continue to monitor. Call with any concerns. Refills given.        Relevant Medications   hydrOXYzine (ATARAX) 25 MG tablet   Other Relevant Orders   CBC with Differential/Platelet   Comprehensive metabolic panel   Depression    Under good control on current regimen. Continue current regimen. Continue to monitor. Call with any concerns. Refills given.        Relevant Medications   hydrOXYzine (ATARAX) 25 MG tablet   Other Relevant Orders   CBC with Differential/Platelet   Comprehensive metabolic panel   Hyperlipidemia    Under good control on current regimen. Continue current regimen. Continue to monitor. Call with any concerns. Refills given. Labs drawn today.        Relevant Medications   tadalafil (CIALIS) 20 MG tablet   lisinopril  (ZESTRIL) 5 MG tablet   rosuvastatin (CRESTOR) 10 MG tablet   Other Relevant Orders   CBC with Differential/Platelet   Lipid Panel w/o Chol/HDL Ratio   Comprehensive metabolic panel     Follow up plan: Return in about 6 months (around 08/30/2023) for physical.

## 2023-03-01 ENCOUNTER — Ambulatory Visit: Payer: Self-pay

## 2023-03-01 LAB — CBC WITH DIFFERENTIAL/PLATELET
Basophils Absolute: 0 10*3/uL (ref 0.0–0.2)
Basos: 1 %
EOS (ABSOLUTE): 0.2 10*3/uL (ref 0.0–0.4)
Eos: 4 %
Hematocrit: 42.9 % (ref 37.5–51.0)
Hemoglobin: 14.7 g/dL (ref 13.0–17.7)
Immature Grans (Abs): 0 10*3/uL (ref 0.0–0.1)
Immature Granulocytes: 0 %
Lymphocytes Absolute: 2.7 10*3/uL (ref 0.7–3.1)
Lymphs: 38 %
MCH: 32 pg (ref 26.6–33.0)
MCHC: 34.3 g/dL (ref 31.5–35.7)
MCV: 94 fL (ref 79–97)
Monocytes Absolute: 0.6 10*3/uL (ref 0.1–0.9)
Monocytes: 9 %
Neutrophils Absolute: 3.4 10*3/uL (ref 1.4–7.0)
Neutrophils: 48 %
Platelets: 256 10*3/uL (ref 150–450)
RBC: 4.59 x10E6/uL (ref 4.14–5.80)
RDW: 13.7 % (ref 11.6–15.4)
WBC: 6.9 10*3/uL (ref 3.4–10.8)

## 2023-03-01 LAB — COMPREHENSIVE METABOLIC PANEL
ALT: 22 IU/L (ref 0–44)
AST: 26 IU/L (ref 0–40)
Albumin/Globulin Ratio: 1.3
Albumin: 4.1 g/dL (ref 3.9–4.9)
Alkaline Phosphatase: 51 IU/L (ref 44–121)
BUN/Creatinine Ratio: 15 (ref 10–24)
BUN: 19 mg/dL (ref 8–27)
Bilirubin Total: 0.3 mg/dL (ref 0.0–1.2)
CO2: 19 mmol/L — ABNORMAL LOW (ref 20–29)
Calcium: 9 mg/dL (ref 8.6–10.2)
Chloride: 104 mmol/L (ref 96–106)
Creatinine, Ser: 1.31 mg/dL — ABNORMAL HIGH (ref 0.76–1.27)
Globulin, Total: 3.2 g/dL (ref 1.5–4.5)
Glucose: 151 mg/dL — ABNORMAL HIGH (ref 70–99)
Potassium: 4.4 mmol/L (ref 3.5–5.2)
Sodium: 139 mmol/L (ref 134–144)
Total Protein: 7.3 g/dL (ref 6.0–8.5)
eGFR: 59 mL/min/{1.73_m2} — ABNORMAL LOW (ref >59)

## 2023-03-01 LAB — LIPID PANEL W/O CHOL/HDL RATIO
Cholesterol, Total: 173 mg/dL (ref 100–199)
HDL: 40 mg/dL (ref >39)
LDL Chol Calc (NIH): 85 mg/dL (ref 0–99)
Triglycerides: 294 mg/dL — ABNORMAL HIGH (ref 0–149)
VLDL Cholesterol Cal: 48 mg/dL — ABNORMAL HIGH (ref 5–40)

## 2023-03-01 NOTE — Telephone Encounter (Signed)
  Chief Complaint: medication problem Symptoms: NA Frequency:  Pertinent Negatives: NA Disposition: [] ED /[] Urgent Care (no appt availability in office) / [] Appointment(In office/virtual)/ []  [facility]/ [x] Home Care/ [] Refused Recommended Disposition /[] [place] [place] [facility]/ []  Follow-up with PCP Additional Notes: pt was prescribed Meloxicam yesterday and had questions on SE and if whether or short term or long term use. Answered pt questions and advised if any more questions to call back. No further assistance needed.   Summary: Wants to discuss side effects of Rx   Pt wants to discuss the side effects of the meloxicam (MOBIC) 7.5 MG tablet   [id number]         Reason for Disposition  Caller has medicine question only, adult not sick, AND triager answers question  Answer Assessment - Initial Assessment Questions 1. NAME of MEDICINE: "What medicine(s) are you calling about?"     Meloxicam  2. QUESTION: "What is your question?" (e.g., double dose of medicine, side effect)     Wanting SE and if ok for long term use or short term  3. PRESCRIBER: "Who prescribed the medicine?" Reason: if prescribed by specialist, call should be referred to that group.     Dr. Laural Benes  4. SYMPTOMS: "Do you have any symptoms?" If Yes, ask: "What symptoms are you having?"  "How bad are the symptoms (e.g., mild, moderate, severe)     NA  Protocols used: Medication Question Call-A-AH

## 2023-03-03 ENCOUNTER — Other Ambulatory Visit: Payer: Self-pay | Admitting: Family Medicine

## 2023-03-03 DIAGNOSIS — N289 Disorder of kidney and ureter, unspecified: Secondary | ICD-10-CM

## 2023-03-03 MED ORDER — LEVOTHYROXINE SODIUM 50 MCG PO TABS: 50.0000 ug | ORAL_TABLET | Freq: Every day | ORAL | 1 refills | Status: DC

## 2023-03-06 ENCOUNTER — Telehealth: Payer: Self-pay | Admitting: Family Medicine

## 2023-03-09 NOTE — Telephone Encounter (Signed)
Created in error

## 2023-03-14 ENCOUNTER — Other Ambulatory Visit: Payer: Self-pay

## 2023-03-17 ENCOUNTER — Other Ambulatory Visit: Payer: Medicare Other

## 2023-03-17 DIAGNOSIS — N289 Disorder of kidney and ureter, unspecified: Secondary | ICD-10-CM | POA: Diagnosis not present

## 2023-03-18 LAB — BASIC METABOLIC PANEL
BUN/Creatinine Ratio: 18 (ref 10–24)
BUN: 21 mg/dL (ref 8–27)
CO2: 21 mmol/L (ref 20–29)
Calcium: 9.3 mg/dL (ref 8.6–10.2)
Chloride: 103 mmol/L (ref 96–106)
Creatinine, Ser: 1.17 mg/dL (ref 0.76–1.27)
Glucose: 147 mg/dL — ABNORMAL HIGH (ref 70–99)
Potassium: 4.4 mmol/L (ref 3.5–5.2)
Sodium: 139 mmol/L (ref 134–144)
eGFR: 68 mL/min/{1.73_m2} (ref >59)

## 2023-03-19 ENCOUNTER — Encounter: Payer: Self-pay | Admitting: Family Medicine

## 2023-03-19 ENCOUNTER — Other Ambulatory Visit: Payer: Self-pay | Admitting: Family Medicine

## 2023-03-19 MED ORDER — FLUTICASONE-SALMETEROL 100-50 MCG/ACT IN AEPB: 1.0000 | INHALATION_SPRAY | Freq: Two times a day (BID) | RESPIRATORY_TRACT | 3 refills | Status: DC

## 2023-03-28 ENCOUNTER — Ambulatory Visit (INDEPENDENT_AMBULATORY_CARE_PROVIDER_SITE_OTHER): Payer: Medicare Other

## 2023-03-28 VITALS — Ht 63.0 in | Wt 173.0 lb

## 2023-03-28 DIAGNOSIS — Z0000 Encounter for general adult medical examination without abnormal findings: Secondary | ICD-10-CM | POA: Diagnosis not present

## 2023-03-28 NOTE — Progress Notes (Signed)
Subjective:   Mitchell [name] is a 67 y.o. male who presents for Medicare Annual/Subsequent preventive examination.  Visit Complete: Virtual  I connected with  Mitchell [name] on 03/28/23 by a audio enabled telemedicine application and verified that I am speaking with the correct person using two identifiers.  Patient Location: Home  Provider Location: Office/Clinic  I discussed the limitations of evaluation and management by telemedicine. The patient expressed understanding and agreed to proceed.  Review of Systems     Cardiac Risk Factors include: advanced age (>42men, >5 women);male gender;hypertension;dyslipidemia     Objective:    Today's Vitals   03/28/23 1538  Weight: 173 lb (78.5 kg)  Height: 5\' 3"  (1.6 m)   Body mass index is 30.65 kg/m.     03/28/2023    3:31 PM 03/16/2022   12:04 PM 01/25/2021    3:23 PM 06/21/2020    5:44 AM 03/24/2020    6:03 AM 01/22/2020    3:21 PM 01/21/2019    3:23 PM  Advanced Directives  Does Patient Have a Medical Advance Directive? No No No No No Yes;No Yes  Type of Advance Directive       Living will;Healthcare Power of Attorney  Copy of Healthcare Power of Attorney in Chart?       No - copy requested  Would patient like information on creating a medical advance directive? No - Patient declined No - Patient declined  No - Patient declined No - Patient declined      Current Medications (verified) Outpatient Encounter Medications as of 03/28/2023  Medication Sig   hydrOXYzine (ATARAX) 25 MG tablet Take 1-2 tablets (25-50 mg total) by mouth 3 (three) times daily as needed for anxiety.   levothyroxine (SYNTHROID) 50 MCG tablet Take 1 tablet (50 mcg total) by mouth daily before breakfast.   lisinopril (ZESTRIL) 5 MG tablet Take 1.5 tablets (7.5 mg total) by mouth daily.   loratadine (CLARITIN) 10 MG tablet Take 10 mg by mouth daily.   meloxicam (MOBIC) 7.5 MG tablet Take 1 tablet (7.5 mg total) by mouth daily.   Multiple Vitamin (MULTI-VITAMINS)  TABS Take by mouth.   rosuvastatin (CRESTOR) 10 MG tablet Take 1 tablet (10 mg total) by mouth daily.   tadalafil (CIALIS) 20 MG tablet TAKE 1/2 TO 1 TABLET BY MOUTH EVERY OTHER DAY AS NEEDED FOR ERECTILE DYSFUNCTION   fluticasone-salmeterol (ADVAIR) 100-50 MCG/ACT AEPB Inhale 1 puff into the lungs 2 (two) times daily. (Patient not taking: Reported on 03/28/2023)   No facility-administered encounter medications on file as of 03/28/2023.    Allergies (verified) Haloperidol decanoate, Atorvastatin, and Codeine   History: Past Medical History:  Diagnosis Date   Anxiety    Cancer (HCC)    skin cancer   Depression    Epididymitis    Hepatitis C    Tested positive, treated now told that he does not have it   Hydrocele    Hypertension    IFG (impaired fasting glucose)    Insomnia    Kidney stone    Schizophrenia [facility])    Past Surgical History:  Procedure Laterality Date   ANKLE FRACTURE SURGERY     COLONOSCOPY  2010   COLONOSCOPY WITH PROPOFOL  06/07/2016   Procedure: COLONOSCOPY WITH PROPOFOL;  Surgeon: Midge Minium, MD;  Location: [facility] ENDOSCOPY;  Service: Endoscopy;;   ESOPHAGOGASTRODUODENOSCOPY (EGD) WITH PROPOFOL N/A 06/07/2016   Procedure: ESOPHAGOGASTRODUODENOSCOPY (EGD) WITH PROPOFOL;  Surgeon: Midge Minium, MD;  Location: [facility] ENDOSCOPY;  Service: Endoscopy;  Laterality: N/A;   HERNIA REPAIR  2011   X 2   Family History  Problem Relation Age of Onset   Diabetes Mother    Alzheimer's disease Father    Diabetes Brother    Diabetes Maternal Grandfather    Social History   Socioeconomic History   Marital status: Married    Spouse name: Inetta Fermo   Number of children: 2   Years of education: 9   Highest education level: 9th grade  Occupational History   Occupation: retired   Tobacco Use   Smoking status: Former    Types: Cigarettes    Quit date: 07/06/1995    Years since quitting: 27.7   Smokeless tobacco: Former  Building services engineer Use: Never used  Substance and  Sexual Activity   Alcohol use: Not Currently    Comment: remote history, quit 20+ years ago (1 beer q 2-3 days)   Drug use: No    Comment: remote history, quit 20= years ago   Sexual activity: Yes  Other Topics Concern   Not on file  Social History Narrative   Not on file   Social Determinants of Health   Financial Resource Strain: Low Risk  (03/28/2023)   Overall Financial Resource Strain (CARDIA)    Difficulty of Paying Living Expenses: Not hard at all  Food Insecurity: No Food Insecurity (03/28/2023)   Hunger Vital Sign    Worried About Running Out of Food in the Last Year: Never true    Ran Out of Food in the Last Year: Never true  Transportation Needs: No Transportation Needs (03/28/2023)   PRAPARE - Administrator, Civil Service (Medical): No    Lack of Transportation (Non-Medical): No  Physical Activity: Insufficiently Active (03/28/2023)   Exercise Vital Sign    Days of Exercise per Week: 7 days    Minutes of Exercise per Session: 10 min  Stress: No Stress Concern Present (03/28/2023)   [employer] of Occupational Health - Occupational Stress Questionnaire    Feeling of Stress : Not at all  Social Connections: Moderately Integrated (03/28/2023)   Social Connection and Isolation Panel [NHANES]    Frequency of Communication with Friends and Family: More than three times a week    Frequency of Social Gatherings with Friends and Family: Twice a week    Attends Religious Services: More than 4 times per year    Active Member of [employer] or Organizations: No    Attends Engineer, structural: Never    Marital Status: Married    Tobacco Counseling Counseling given: Not Answered   Clinical Intake:  Pre-visit preparation completed: Yes  Pain : No/denies pain     Nutritional Risks: None Diabetes: No  How often do you need to have someone help you when you read instructions, pamphlets, or other written materials from your doctor or pharmacy?: 1 -  Never  Interpreter Needed?: No  Information entered by :: Kennedy Bucker, LPN   Activities of Daily Living    03/28/2023    3:32 PM  In your present state of health, do you have any difficulty performing the following activities:  Hearing? 0  Vision? 0  Difficulty concentrating or making decisions? 0  Walking or climbing stairs? 0  Dressing or bathing? 0  Doing errands, shopping? 0  Preparing Food and eating ? N  Using the Toilet? N  In the past six months, have you accidently leaked urine? N  Do you  have problems with loss of bowel control? N  Managing your Medications? N  Managing your Finances? N  Housekeeping or managing your Housekeeping? N    Patient Care Team: Dorcas Carrow, DO as PCP - General (Family Medicine) Marlowe Sax, RN as Case Manager (General Practice)  Indicate any recent Medical Services you may have received from other than [facility] providers in the past year (date may be approximate).     Assessment:   This is a routine wellness examination for Mitchell [name].  Hearing/Vision screen Hearing Screening - Comments:: No aids Vision Screening - Comments:: Wears glasses- Dr.Woodard  Dietary issues and exercise activities discussed:     Goals Addressed             This Visit's Progress    DIET - EAT MORE FRUITS AND VEGETABLES         Depression Screen    03/28/2023    3:30 PM 08/29/2022    3:35 PM 03/16/2022   12:08 PM 02/07/2022    3:43 PM 08/09/2021    4:01 PM 02/05/2021    3:55 PM 01/25/2021    3:24 PM  PHQ 2/9 Scores  PHQ - 2 Score 0 0 2 0 1 0 0  PHQ- 9 Score 0 2 2 0 3 2     Fall Risk    03/28/2023    3:32 PM 02/28/2023    4:15 PM 08/29/2022    3:35 PM 03/16/2022   12:04 PM 02/07/2022    3:44 PM  Fall Risk   Falls in the past year? 0 0 0 0 0  Number falls in past yr: 0 0 0 0 0  Injury with Fall? 0 0 0 0 0  Risk for fall due to : No Fall Risks No Fall Risks No Fall Risks  No Fall Risks  Follow up Falls prevention discussed;Falls evaluation  completed  Falls evaluation completed Falls evaluation completed;Education provided;Falls prevention discussed Falls evaluation completed    MEDICARE RISK AT HOME:  Medicare Risk at Home - 03/28/23 1532     Any stairs in or around the home? No    If so, are there any without handrails? No    Home free of loose throw rugs in walkways, pet beds, electrical cords, etc? Yes    Adequate lighting in your home to reduce risk of falls? Yes    Life alert? No    Use of a cane, walker or w/c? No    Grab bars in the bathroom? Yes    Shower chair or bench in shower? Yes    Elevated toilet seat or a handicapped toilet? Yes             TIMED UP AND GO:  Was the test performed?  No    Cognitive Function:        03/28/2023    3:33 PM 03/16/2022   12:06 PM 01/25/2021    3:25 PM 01/21/2019    3:23 PM 01/12/2017    3:54 PM  6CIT Screen  What Year? 0 points 0 points 0 points 0 points 0 points  What month? 0 points 0 points 0 points 0 points 0 points  What time? 0 points 0 points 0 points 0 points 0 points  Count back from 20 0 points 0 points 0 points 0 points 0 points  Months in reverse 0 points 4 points 0 points 0 points 0 points  Repeat phrase 0 points 0 points 8 points 0 points 4  points  Total Score 0 points 4 points 8 points 0 points 4 points    Immunizations Immunization History  Administered Date(s) Administered   Fluad Quad(high Dose 65+) 06/26/2020   Influenza,inj,Quad PF,6+ Mos 06/08/2015, 06/27/2016, 07/18/2017, 07/03/2018   Influenza-Unspecified 07/04/2019, 06/21/2021, 06/19/2022, 06/22/2022   Janssen (J&J) SARS-COV-2 Vaccination 12/12/2019   MMR 01/25/1995   Moderna Sars-Covid-2 Vaccination 07/16/2020, 01/04/2021   Pneumococcal Conjugate-13 02/14/2020   Pneumococcal Polysaccharide-23 05/17/2013, 08/09/2021   Rsv, Bivalent, Protein Subunit Rsvpref,pf Verdis Frederickson) 07/11/2022   Td 07/27/2018   Zoster, Live 06/24/2015    TDAP status: Up to date  Flu Vaccine status: Up to  date  Pneumococcal vaccine status: Up to date  Covid-19 vaccine status: Completed vaccines  Qualifies for Shingles Vaccine? Yes   Zostavax completed Yes   Shingrix Completed?: No.    Education has been provided regarding the importance of this vaccine. Patient has been advised to call insurance company to determine out of pocket expense if they have not yet received this vaccine. Advised may also receive vaccine at local pharmacy or Health Dept. Verbalized acceptance and understanding.  Screening Tests Health Maintenance  Topic Date Due   Zoster Vaccines- Shingrix (1 of 2) Never done   COVID-19 Vaccine (4 - 2023-24 season) 05/20/2022   INFLUENZA VACCINE  04/20/2023   Medicare Annual Wellness (AWV)  03/27/2024   Colonoscopy  06/07/2026   DTaP/Tdap/Td (2 - Tdap) 07/27/2028   Pneumonia Vaccine 62+ Years old  Completed   Hepatitis C Screening  Completed   HPV VACCINES  Aged Out    Health Maintenance  Health Maintenance Due  Topic Date Due   Zoster Vaccines- Shingrix (1 of 2) Never done   COVID-19 Vaccine (4 - 2023-24 season) 05/20/2022    Colorectal cancer screening: Type of screening: Colonoscopy. Completed 06/07/16. Repeat every 10 years  Lung Cancer Screening: (Low Dose CT Chest recommended if Age 55-80 years, 20 pack-year currently smoking OR have quit w/in 15years.) does not qualify.    Additional Screening:  Hepatitis C Screening: does qualify; Completed 08/09/21  Vision Screening: Recommended annual ophthalmology exams for early detection of glaucoma and other disorders of the eye. Is the patient up to date with their annual eye exam?  Yes  Who is the provider or what is the name of the office in which the patient attends annual eye exams? Dr.Woodard If pt is not established with a provider, would they like to be referred to a provider to establish care? No .   Dental Screening: Recommended annual dental exams for proper oral hygiene   Community Resource Referral /  Chronic Care Management: CRR required this visit?  No   CCM required this visit?  No     Plan:     I have personally reviewed and noted the following in the patient's chart:   Medical and social history Use of alcohol, tobacco or illicit drugs  Current medications and supplements including opioid prescriptions. Patient is not currently taking opioid prescriptions. Functional ability and status Nutritional status Physical activity Advanced directives List of other physicians Hospitalizations, surgeries, and ER visits in previous 12 months Vitals Screenings to include cognitive, depression, and falls Referrals and appointments  In addition, I have reviewed and discussed with patient certain preventive protocols, quality metrics, and best practice recommendations. A written personalized care plan for preventive services as well as general preventive health recommendations were provided to patient.     Hal Hope, LPN   10/23/4008   After Visit Summary: (  MyChart) Due to this being a telephonic visit, the after visit summary with patients personalized plan was offered to patient via MyChart   Nurse Notes: none

## 2023-03-28 NOTE — Patient Instructions (Signed)
Mitchell [name] , Thank you for taking time to come for your Medicare Wellness Visit. I appreciate your ongoing commitment to your health goals. Please review the following plan we discussed and let me know if I can assist you in the future.   These are the goals we discussed:  Goals      DIET - EAT MORE FRUITS AND VEGETABLES     DIET - INCREASE WATER INTAKE     Recommend drinking at least 6-8 glasses of water a day      Patient Stated     01/25/2021, live as long as he can     Patient Stated     No goals.  Staying alive        This is a list of the screening recommended for you and due dates:  Health Maintenance  Topic Date Due   Zoster (Shingles) Vaccine (1 of 2) Never done   COVID-19 Vaccine (4 - 2023-24 season) 05/20/2022   Flu Shot  04/20/2023   Medicare Annual Wellness Visit  03/27/2024   Colon Cancer Screening  06/07/2026   DTaP/Tdap/Td vaccine (2 - Tdap) 07/27/2028   Pneumonia Vaccine  Completed   Hepatitis C Screening  Completed   HPV Vaccine  Aged Out    Advanced directives: no  Conditions/risks identified: none  Next appointment: Follow up in one year for your annual wellness visit. 04/02/24 @ 1:00 pm by phone  Preventive Care 65 Years and Older, Male  Preventive care refers to lifestyle choices and visits with your health care provider that can promote health and wellness. What does preventive care include? A yearly physical exam. This is also called an annual well check. Dental exams once or twice a year. Routine eye exams. Ask your health care provider how often you should have your eyes checked. Personal lifestyle choices, including: Daily care of your teeth and gums. Regular physical activity. Eating a healthy diet. Avoiding tobacco and drug use. Limiting alcohol use. Practicing safe sex. Taking low doses of aspirin every day. Taking vitamin and mineral supplements as recommended by your health care provider. What happens during an annual well check? The  services and screenings done by your health care provider during your annual well check will depend on your age, overall health, lifestyle risk factors, and family history of disease. Counseling  Your health care provider may ask you questions about your: Alcohol use. Tobacco use. Drug use. Emotional well-being. Home and relationship well-being. Sexual activity. Eating habits. History of falls. Memory and ability to understand (cognition). Work and work Astronomer. Screening  You may have the following tests or measurements: Height, weight, and BMI. Blood pressure. Lipid and cholesterol levels. These may be checked every 5 years, or more frequently if you are over 37 years old. Skin check. Lung cancer screening. You may have this screening every year starting at age 45 if you have a 30-pack-year history of smoking and currently smoke or have quit within the past 15 years. Fecal occult blood test (FOBT) of the stool. You may have this test every year starting at age 22. Flexible sigmoidoscopy or colonoscopy. You may have a sigmoidoscopy every 5 years or a colonoscopy every 10 years starting at age 46. Prostate cancer screening. Recommendations will vary depending on your family history and other risks. Hepatitis C blood test. Hepatitis B blood test. Sexually transmitted disease (STD) testing. Diabetes screening. This is done by checking your blood sugar (glucose) after you have not eaten for a while (fasting).  You may have this done every 1-3 years. Abdominal aortic aneurysm (AAA) screening. You may need this if you are a current or former smoker. Osteoporosis. You may be screened starting at age 24 if you are at high risk. Talk with your health care provider about your test results, treatment options, and if necessary, the need for more tests. Vaccines  Your health care provider may recommend certain vaccines, such as: Influenza vaccine. This is recommended every year. Tetanus,  diphtheria, and acellular pertussis (Tdap, Td) vaccine. You may need a Td booster every 10 years. Zoster vaccine. You may need this after age 39. Pneumococcal 13-valent conjugate (PCV13) vaccine. One dose is recommended after age 60. Pneumococcal polysaccharide (PPSV23) vaccine. One dose is recommended after age 45. Talk to your health care provider about which screenings and vaccines you need and how often you need them. This information is not intended to replace advice given to you by your health care provider. Make sure you discuss any questions you have with your health care provider. Document Released: 10/02/2015 Document Revised: 05/25/2016 Document Reviewed: 07/07/2015 Elsevier Interactive Patient Education  2017 [employer].  Fall Prevention in the Home Falls can cause injuries. They can happen to people of all ages. There are many things you can do to make your home safe and to help prevent falls. What can I do on the outside of my home? Regularly fix the edges of walkways and driveways and fix any cracks. Remove anything that might make you trip as you walk through a door, such as a raised step or threshold. Trim any bushes or trees on the path to your home. Use bright outdoor lighting. Clear any walking paths of anything that might make someone trip, such as rocks or tools. Regularly check to see if handrails are loose or broken. Make sure that both sides of any steps have handrails. Any raised decks and porches should have guardrails on the edges. Have any leaves, snow, or ice cleared regularly. Use sand or salt on walking paths during winter. Clean up any spills in your garage right away. This includes oil or grease spills. What can I do in the bathroom? Use night lights. Install grab bars by the toilet and in the tub and shower. Do not use towel bars as grab bars. Use non-skid mats or decals in the tub or shower. If you need to sit down in the shower, use a plastic,  non-slip stool. Keep the floor dry. Clean up any water that spills on the floor as soon as it happens. Remove soap buildup in the tub or shower regularly. Attach bath mats securely with double-sided non-slip rug tape. Do not have throw rugs and other things on the floor that can make you trip. What can I do in the bedroom? Use night lights. Make sure that you have a light by your bed that is easy to reach. Do not use any sheets or blankets that are too big for your bed. They should not hang down onto the floor. Have a firm chair that has side arms. You can use this for support while you get dressed. Do not have throw rugs and other things on the floor that can make you trip. What can I do in the kitchen? Clean up any spills right away. Avoid walking on wet floors. Keep items that you use a lot in easy-to-reach places. If you need to reach something above you, use a strong step stool that has a grab bar. Keep  electrical cords out of the way. Do not use floor polish or wax that makes floors slippery. If you must use wax, use non-skid floor wax. Do not have throw rugs and other things on the floor that can make you trip. What can I do with my stairs? Do not leave any items on the stairs. Make sure that there are handrails on both sides of the stairs and use them. Fix handrails that are broken or loose. Make sure that handrails are as long as the stairways. Check any carpeting to make sure that it is firmly attached to the stairs. Fix any carpet that is loose or worn. Avoid having throw rugs at the top or bottom of the stairs. If you do have throw rugs, attach them to the floor with carpet tape. Make sure that you have a light switch at the top of the stairs and the bottom of the stairs. If you do not have them, ask someone to add them for you. What else can I do to help prevent falls? Wear shoes that: Do not have high heels. Have rubber bottoms. Are comfortable and fit you well. Are closed  at the toe. Do not wear sandals. If you use a stepladder: Make sure that it is fully opened. Do not climb a closed stepladder. Make sure that both sides of the stepladder are locked into place. Ask someone to hold it for you, if possible. Clearly mark and make sure that you can see: Any grab bars or handrails. First and last steps. Where the edge of each step is. Use tools that help you move around (mobility aids) if they are needed. These include: Canes. Walkers. Scooters. Crutches. Turn on the lights when you go into a dark area. Replace any light bulbs as soon as they burn out. Set up your furniture so you have a clear path. Avoid moving your furniture around. If any of your floors are uneven, fix them. If there are any pets around you, be aware of where they are. Review your medicines with your doctor. Some medicines can make you feel dizzy. This can increase your chance of falling. Ask your doctor what other things that you can do to help prevent falls. This information is not intended to replace advice given to you by your health care provider. Make sure you discuss any questions you have with your health care provider. Document Released: 07/02/2009 Document Revised: 02/11/2016 Document Reviewed: 10/10/2014 Elsevier Interactive Patient Education  2017 [employer].

## 2023-04-17 ENCOUNTER — Ambulatory Visit (INDEPENDENT_AMBULATORY_CARE_PROVIDER_SITE_OTHER): Payer: Medicare Other | Admitting: Family Medicine

## 2023-04-17 ENCOUNTER — Telehealth: Payer: Self-pay | Admitting: Family Medicine

## 2023-04-17 ENCOUNTER — Encounter: Payer: Self-pay | Admitting: Family Medicine

## 2023-04-17 VITALS — BP 117/76 | HR 68 | Temp 97.5°F | Wt 170.2 lb

## 2023-04-17 DIAGNOSIS — F209 Schizophrenia, unspecified: Secondary | ICD-10-CM | POA: Diagnosis not present

## 2023-04-17 DIAGNOSIS — R7301 Impaired fasting glucose: Secondary | ICD-10-CM | POA: Diagnosis not present

## 2023-04-17 LAB — BAYER DCA HB A1C WAIVED: HB A1C (BAYER DCA - WAIVED): 6.2 % — ABNORMAL HIGH (ref 4.8–5.6)

## 2023-04-17 NOTE — Assessment & Plan Note (Signed)
Will recheck labs today. Will get him into nutrition. Information about dietary changes given today. Call with any concerns.

## 2023-04-17 NOTE — Progress Notes (Signed)
BP 117/76   Pulse 68   Temp (!) 97.5 F (36.4 C) (Oral)   Wt 170 lb 3.2 oz (77.2 kg)   SpO2 93%   BMI 30.15 kg/m    Subjective:    Patient ID: Mitchell [name], male    DOB: 1956-05-04, 67 y.o.   MRN: [id number]  HPI: Mitchell [name] is a 67 y.o. male  Chief Complaint  Patient presents with   IFG   Impaired Fasting Glucose HbA1C:  Lab Results  Component Value Date   HGBA1C 6.2 (H) 02/28/2023   Duration of elevated blood sugar: chronic Polydipsia: no Polyuria: no Weight change: no Visual disturbance: no Glucose Monitoring: no Diabetic Education: Not Completed Family history of diabetes: no  Relevant past medical, surgical, family and social history reviewed and updated as indicated. Interim medical history since our last visit reviewed. Allergies and medications reviewed and updated.  Review of Systems  Constitutional: Negative.   Respiratory: Negative.    Cardiovascular: Negative.   Gastrointestinal: Negative.   Genitourinary: Negative.   Musculoskeletal: Negative.   Psychiatric/Behavioral: Negative.      Per HPI unless specifically indicated above     Objective:    BP 117/76   Pulse 68   Temp (!) 97.5 F (36.4 C) (Oral)   Wt 170 lb 3.2 oz (77.2 kg)   SpO2 93%   BMI 30.15 kg/m   Wt Readings from Last 3 Encounters:  04/17/23 170 lb 3.2 oz (77.2 kg)  03/28/23 173 lb (78.5 kg)  02/28/23 173 lb (78.5 kg)    Physical Exam Vitals and nursing note reviewed.  Constitutional:      General: He is not in acute distress.    Appearance: Normal appearance. He is not ill-appearing, toxic-appearing or diaphoretic.  HENT:     Head: Normocephalic and atraumatic.     Right Ear: External ear normal.     Left Ear: External ear normal.     Nose: Nose normal.     Mouth/Throat:     Mouth: Mucous membranes are moist.     Pharynx: Oropharynx is clear.  Eyes:     General: No scleral icterus.       Right eye: No discharge.        Left eye: No discharge.      Extraocular Movements: Extraocular movements intact.     Conjunctiva/sclera: Conjunctivae normal.     Pupils: Pupils are equal, round, and reactive to light.  Cardiovascular:     Rate and Rhythm: Normal rate and regular rhythm.     Pulses: Normal pulses.     Heart sounds: Normal heart sounds. No murmur heard.    No friction rub. No gallop.  Pulmonary:     Effort: Pulmonary effort is normal. No respiratory distress.     Breath sounds: Normal breath sounds. No stridor. No wheezing, rhonchi or rales.  Chest:     Chest wall: No tenderness.  Musculoskeletal:        General: Normal range of motion.     Cervical back: Normal range of motion and neck supple.  Skin:    General: Skin is warm and dry.     Capillary Refill: Capillary refill takes less than 2 seconds.     Coloration: Skin is not jaundiced or pale.     Findings: No bruising, erythema, lesion or rash.  Neurological:     General: No focal deficit present.     Mental Status: He is alert and oriented to person,  place, and time. Mental status is at baseline.  Psychiatric:        Mood and Affect: Mood normal.        Behavior: Behavior normal.        Thought Content: Thought content normal.        Judgment: Judgment normal.     Results for orders placed or performed in visit on 03/17/23  Basic metabolic panel  Result Value Ref Range   Glucose 147 (H) 70 - 99 mg/dL   BUN 21 8 - 27 mg/dL   Creatinine, Ser 6.04 0.76 - 1.27 mg/dL   eGFR 68 >54 UJ/WJX/9.14   BUN/Creatinine Ratio 18 10 - 24   Sodium 139 134 - 144 mmol/L   Potassium 4.4 3.5 - 5.2 mmol/L   Chloride 103 96 - 106 mmol/L   CO2 21 20 - 29 mmol/L   Calcium 9.3 8.6 - 10.2 mg/dL      Assessment & Plan:   Problem List Items Addressed This Visit       Endocrine   IFG (impaired fasting glucose) - Primary    Will recheck labs today. Will get him into nutrition. Information about dietary changes given today. Call with any concerns.       Relevant Orders   Basic  metabolic panel   Bayer DCA Hb N8G Waived   Amb ref to Medical Nutrition Therapy-MNT     Other   Schizophrenia (HCC)    Stable. No concerns. Continue to monitor.         Follow up plan: Return as scheduled, for please print letter for patient.

## 2023-04-17 NOTE — Telephone Encounter (Signed)
Patient being seen today

## 2023-04-17 NOTE — Telephone Encounter (Unsigned)
Copied from CRM [id number]. Topic: General - Other >> Apr 17, 2023  8:26 AM Franchot Heidelberg wrote: Reason for CRM: Pt called following up on home health nurse. Says he needs a chart showing what diabetics can eat and not eat. Wants to pick this up today so he "can start eating right"   Also wants to see a kidney specialist, he wants to know what stage his kidneys are in.

## 2023-04-17 NOTE — Assessment & Plan Note (Signed)
Stable. No concerns. Continue to monitor.  

## 2023-04-21 ENCOUNTER — Encounter: Payer: Self-pay | Admitting: Family Medicine

## 2023-04-25 DIAGNOSIS — H25013 Cortical age-related cataract, bilateral: Secondary | ICD-10-CM | POA: Diagnosis not present

## 2023-04-25 DIAGNOSIS — H2513 Age-related nuclear cataract, bilateral: Secondary | ICD-10-CM | POA: Diagnosis not present

## 2023-04-25 DIAGNOSIS — H35033 Hypertensive retinopathy, bilateral: Secondary | ICD-10-CM | POA: Diagnosis not present

## 2023-05-31 ENCOUNTER — Ambulatory Visit: Payer: Medicare Other | Admitting: Dermatology

## 2023-05-31 ENCOUNTER — Encounter: Payer: Self-pay | Admitting: Dermatology

## 2023-05-31 VITALS — BP 123/76 | HR 86

## 2023-05-31 DIAGNOSIS — L570 Actinic keratosis: Secondary | ICD-10-CM

## 2023-05-31 DIAGNOSIS — W908XXA Exposure to other nonionizing radiation, initial encounter: Secondary | ICD-10-CM | POA: Diagnosis not present

## 2023-05-31 DIAGNOSIS — L814 Other melanin hyperpigmentation: Secondary | ICD-10-CM | POA: Diagnosis not present

## 2023-05-31 DIAGNOSIS — L821 Other seborrheic keratosis: Secondary | ICD-10-CM

## 2023-05-31 DIAGNOSIS — L578 Other skin changes due to chronic exposure to nonionizing radiation: Secondary | ICD-10-CM

## 2023-05-31 DIAGNOSIS — L820 Inflamed seborrheic keratosis: Secondary | ICD-10-CM | POA: Diagnosis not present

## 2023-05-31 NOTE — Progress Notes (Signed)
Follow-Up Visit   Subjective  Mitchell [name] is a 67 y.o. male who presents for the following: 6 month isk and ak follow up. Hx of isk at back, hx of aks at face.   The patient has spots, moles and lesions to be evaluated, some may be new or changing and the patient may have concern these could be cancer.   The following portions of the chart were reviewed this encounter and updated as appropriate: medications, allergies, medical history  Review of Systems:  No other skin or systemic complaints except as noted in HPI or Assessment and Plan.  Objective  Well appearing patient in no apparent distress; mood and affect are within normal limits.  A focused examination was performed of the following areas: Face, back  Relevant exam findings are noted in the Assessment and Plan.  scalp x 2 (2) Erythematous thin papules/macules with gritty scale.   back x 20 (20) Erythematous stuck-on, waxy papule or plaque    Assessment & Plan     SEBORRHEIC KERATOSIS - Stuck-on, waxy, tan-brown papules and/or plaques  - Benign-appearing - Discussed benign etiology and prognosis. - Observe - Call for any changes   Actinic keratosis (2) scalp x 2  Actinic keratoses are precancerous spots that appear secondary to cumulative UV radiation exposure/sun exposure over time. They are chronic with expected duration over 1 year. A portion of actinic keratoses will progress to squamous cell carcinoma of the skin. It is not possible to reliably predict which spots will progress to skin cancer and so treatment is recommended to prevent development of skin cancer.  Recommend daily broad spectrum sunscreen SPF 30+ to sun-exposed areas, reapply every 2 hours as needed.  Recommend staying in the shade or wearing long sleeves, sun glasses (UVA+UVB protection) and wide brim hats (4-inch brim around the entire circumference of the hat). Call for new or changing lesions.  Destruction of lesion - scalp x 2  (2) Complexity: simple   Destruction method: cryotherapy   Informed consent: discussed and consent obtained   Timeout:  patient name, date of birth, surgical site, and procedure verified Lesion destroyed using liquid nitrogen: Yes   Region frozen until ice ball extended beyond lesion: Yes   Outcome: patient tolerated procedure well with no complications   Post-procedure details: wound care instructions given    Inflamed seborrheic keratosis (20) back x 20  Symptomatic, irritating, patient would like treated.  Destruction of lesion - back x 20 (20) Complexity: simple   Destruction method: cryotherapy   Informed consent: discussed and consent obtained   Timeout:  patient name, date of birth, surgical site, and procedure verified Lesion destroyed using liquid nitrogen: Yes   Region frozen until ice ball extended beyond lesion: Yes   Outcome: patient tolerated procedure well with no complications   Post-procedure details: wound care instructions given    ACTINIC DAMAGE - chronic, secondary to cumulative UV radiation exposure/sun exposure over time - diffuse scaly erythematous macules with underlying dyspigmentation - Recommend daily broad spectrum sunscreen SPF 30+ to sun-exposed areas, reapply every 2 hours as needed.  - Recommend staying in the shade or wearing long sleeves, sun glasses (UVA+UVB protection) and wide brim hats (4-inch brim around the entire circumference of the hat). - Call for new or changing lesions.  LENTIGINES Exam: scattered tan macules Due to sun exposure Treatment Plan: Benign-appearing, observe. Recommend daily broad spectrum sunscreen SPF 30+ to sun-exposed areas, reapply every 2 hours as needed.  Call for any  changes   Return for 6 month isk and aks .  IAsher Muir, CMA, am acting as scribe for Armida Sans, MD.   Documentation: I have reviewed the above documentation for accuracy and completeness, and I agree with the above.  Armida Sans, MD

## 2023-05-31 NOTE — Patient Instructions (Addendum)
Seborrheic Keratosis  What causes seborrheic keratoses? Seborrheic keratoses are harmless, common skin growths that first appear during adult life.  As time goes by, more growths appear.  Some people may develop a large number of them.  Seborrheic keratoses appear on both covered and uncovered body parts.  They are not caused by sunlight.  The tendency to develop seborrheic keratoses can be inherited.  They vary in color from skin-colored to gray, brown, or even black.  They can be either smooth or have a rough, warty surface.   Seborrheic keratoses are superficial and look as if they were stuck on the skin.  Under the microscope this type of keratosis looks like layers upon layers of skin.  That is why at times the top layer may seem to fall off, but the rest of the growth remains and re-grows.    Treatment Seborrheic keratoses do not need to be treated, but can easily be removed in the office.  Seborrheic keratoses often cause symptoms when they rub on clothing or jewelry.  Lesions can be in the way of shaving.  If they become inflamed, they can cause itching, soreness, or burning.  Removal of a seborrheic keratosis can be accomplished by freezing, burning, or surgery. If any spot bleeds, scabs, or grows rapidly, please return to have it checked, as these can be an indication of a skin cancer.  Cryotherapy Aftercare  Wash gently with soap and water everyday.   Apply Vaseline and Band-Aid daily until healed.   Actinic keratoses are precancerous spots that appear secondary to cumulative UV radiation exposure/sun exposure over time. They are chronic with expected duration over 1 year. A portion of actinic keratoses will progress to squamous cell carcinoma of the skin. It is not possible to reliably predict which spots will progress to skin cancer and so treatment is recommended to prevent development of skin cancer.  Recommend daily broad spectrum sunscreen SPF 30+ to sun-exposed areas, reapply every  2 hours as needed.  Recommend staying in the shade or wearing long sleeves, sun glasses (UVA+UVB protection) and wide brim hats (4-inch brim around the entire circumference of the hat). Call for new or changing lesions.      Due to recent changes in healthcare laws, you may see results of your pathology and/or laboratory studies on MyChart before the doctors have had a chance to review them. We understand that in some cases there may be results that are confusing or concerning to you. Please understand that not all results are received at the same time and often the doctors may need to interpret multiple results in order to provide you with the best plan of care or course of treatment. Therefore, we ask that you please give [place] 2 business days to thoroughly review all your results before contacting the office for clarification. Should we see a critical lab result, you will be contacted sooner.   If You Need Anything After Your Visit  If you have any questions or concerns for your doctor, please call our main line at [phone] and press option 4 to reach your doctor's medical assistant. If no one answers, please leave a voicemail as directed and we will return your call as soon as possible. Messages left after 4 pm will be answered the following business day.   You may also send [place] a message via MyChart. We typically respond to MyChart messages within 1-2 business days.  For prescription refills, please ask your pharmacy to contact our office. Our  fax number is [phone].  If you have an urgent issue when the clinic is closed that cannot wait until the next business day, you can page your doctor at the number below.    Please note that while we do our best to be available for urgent issues outside of office hours, we are not available 24/7.   If you have an urgent issue and are unable to reach [place], you may choose to seek medical care at your doctor's office, retail clinic, urgent care  center, or emergency room.  If you have a medical emergency, please immediately call 911 or go to the emergency department.  Pager Numbers  - Dr. Gwen Pounds: [phone]  - Dr. Roseanne Reno: [phone]  - Dr. Katrinka Blazing: [phone]   In the event of inclement weather, please call our main line at [phone] for an update on the status of any delays or closures.  Dermatology Medication Tips: Please keep the boxes that topical medications come in in order to help keep track of the instructions about where and how to use these. Pharmacies typically print the medication instructions only on the boxes and not directly on the medication tubes.   If your medication is too expensive, please contact our office at [phone] option 4 or send [place] a message through MyChart.   We are unable to tell what your co-pay for medications will be in advance as this is different depending on your insurance coverage. However, we may be able to find a substitute medication at lower cost or fill out paperwork to get insurance to cover a needed medication.   If a prior authorization is required to get your medication covered by your insurance company, please allow [place] 1-2 business days to complete this process.  Drug prices often vary depending on where the prescription is filled and some pharmacies may offer cheaper prices.  The website www.goodrx.com contains coupons for medications through different pharmacies. The prices here do not account for what the cost may be with help from insurance (it may be cheaper with your insurance), but the website can give you the price if you did not use any insurance.  - You can print the associated coupon and take it with your prescription to the pharmacy.  - You may also stop by our office during regular business hours and pick up a GoodRx coupon card.  - If you need your prescription sent electronically to a different pharmacy, notify our office through [facility] or by  phone at [phone] option 4.     Si Usted Necesita Algo Despus de Su Visita  Tambin puede enviarnos un mensaje a travs de Clinical cytogeneticist. Por lo general respondemos a los mensajes de MyChart en el transcurso de 1 a 2 das hbiles.  Para renovar recetas, por favor pida a su farmacia que se ponga en contacto con nuestra oficina. Annie Sable de fax es [place] [phone].  Si tiene un asunto urgente cuando la clnica est cerrada y que no puede esperar hasta el siguiente da hbil, puede llamar/localizar a su doctor(a) al nmero que aparece a continuacin.   Por favor, tenga en cuenta que aunque hacemos todo lo posible para estar disponibles para asuntos urgentes fuera del horario de [place], no estamos disponibles las 24 horas del da, los 7 [address]  [address] de la [place].   Si tiene un problema urgente y no puede comunicarse con nosotros, puede optar por buscar atencin mdica  en el consultorio de su doctor(a), en una clnica privada, en un  centro de atencin urgente o en una sala de emergencias.  Si tiene Engineer, drilling, por favor llame inmediatamente al 911 o vaya a la sala de emergencias.  Nmeros de bper  - Dr. Gwen Pounds: [phone]  - Dra. Roseanne Reno: [phone]  - Dr. Katrinka Blazing: [phone]   En caso de inclemencias del tiempo, por favor llame a Lacy Duverney principal al [phone] para una actualizacin sobre el [place] de cualquier retraso o cierre.  Consejos para la medicacin en dermatologa: Por favor, guarde las cajas en las que vienen los medicamentos de uso tpico para ayudarle a seguir las instrucciones sobre dnde y cmo usarlos. Las farmacias generalmente imprimen las instrucciones del medicamento slo en las cajas y no directamente en los tubos del [place].   Si su medicamento es muy caro, por favor, pngase en contacto con Rolm Gala llamando al [phone] y presione la opcin 4 o envenos un mensaje a travs de Clinical cytogeneticist.   No podemos decirle cul ser su copago  por los medicamentos por adelantado ya que esto es diferente dependiendo de la cobertura de su seguro. Sin embargo, es posible que podamos encontrar un medicamento sustituto a Audiological scientist un formulario para que el seguro cubra el medicamento que se considera necesario.   Si se requiere una autorizacin previa para que su compaa de seguros [place] su medicamento, por favor permtanos de 1 a 2 das hbiles para completar [address]  Los precios de los medicamentos varan con frecuencia dependiendo del Environmental consultant de dnde se surte la receta y alguna farmacias pueden ofrecer precios ms baratos.  El sitio web www.goodrx.com tiene cupones para medicamentos de Health and safety inspector. Los precios aqu no tienen en cuenta lo que podra costar con la ayuda del seguro (puede ser ms barato con su seguro), pero el sitio web puede darle el precio si no utiliz Tourist information centre manager.  - Puede imprimir el cupn correspondiente y llevarlo con su receta a la farmacia.  - Tambin puede pasar por nuestra oficina durante el horario de atencin regular y Education officer, museum una tarjeta de cupones de GoodRx.  - Si necesita que su receta se enve electrnicamente a una farmacia diferente, informe a nuestra oficina a travs de MyChart de [place] o por telfono llamando al [phone] y presione la opcin 4.

## 2023-06-02 ENCOUNTER — Encounter: Payer: Self-pay | Admitting: Dermatology

## 2023-08-08 ENCOUNTER — Other Ambulatory Visit: Payer: Self-pay

## 2023-08-11 ENCOUNTER — Ambulatory Visit: Payer: Self-pay | Admitting: *Deleted

## 2023-08-11 NOTE — Telephone Encounter (Signed)
20mg  is max dose. He will need to see urology for other options. Would he like me to refer him?

## 2023-08-11 NOTE — Telephone Encounter (Signed)
Routing to provider  

## 2023-08-11 NOTE — Telephone Encounter (Signed)
  Chief Complaint: requesting increase dose or change in medication for cialis 20 mg  Symptoms: when taking medication does not have erection. "Not working at all" denies any other sx  Frequency: na  Pertinent Negatives: Patient denies urinary issues no other sx Disposition: [] ED /[] Urgent Care (no appt availability in office) / [] Appointment(In office/virtual)/ []  [place] [facility]/ [] Home Care/ [] Refused Recommended Disposition /[] [facility]/ [x]  Follow-up with PCP Additional Notes:   Please advise if appt needed before scheduled physical 08/31/23. Patient requesting to try alternative medication prior to appt to see if medication will work. Please advise.   Summary: med management   Pt stated the medication tadalafil (CIALIS) 20 MG tablet is not working for him anymore. Pt is requesting something stronger and/or requesting a dose increase.  Seeking clinical advice.             Reason for Disposition  Caller wants to use a complementary or alternative medicine  Answer Assessment - Initial Assessment Questions 1. NAME of MEDICINE: "What medicine(s) are you calling about?"     Cialis 20 mg  2. QUESTION: "What is your question?" (e.g., double dose of medicine, side effect)     Can medication dose be increased or change medication to something else, is not working? 3. PRESCRIBER: "Who prescribed the medicine?" Reason: if prescribed by specialist, call should be referred to that group.     PCP 4. SYMPTOMS: "Do you have any symptoms?" If Yes, ask: "What symptoms are you having?"  "How bad are the symptoms (e.g., mild, moderate, severe)     Medication is not working to cause erections 5. PREGNANCY:  "Is there any chance that you are pregnant?" "When was your last menstrual period?"     na  Protocols used: Medication Question Call-A-AH

## 2023-08-11 NOTE — Telephone Encounter (Signed)
Will leave for Dr. Laural Benes review upon return.

## 2023-08-14 ENCOUNTER — Encounter: Payer: Self-pay | Admitting: *Deleted

## 2023-08-14 NOTE — Telephone Encounter (Signed)
Called and LVM asking for patient to please return my call.   OK for PEC to speak to patient, advise him of Dr. Henriette Combs message, and find out if he would like the referral if he calls back.

## 2023-08-14 NOTE — Telephone Encounter (Signed)
Pt returned call. Message fro Dr. Laural Benes read to pt. Declines referral to urology. Questioning if he could try Viagra.  Please advise.

## 2023-08-14 NOTE — Telephone Encounter (Signed)
Patient returned call and during transfer from agent call disconnected. NT attempted to contact patient again , no answer, LVMTCB for message from PCP.

## 2023-08-14 NOTE — Telephone Encounter (Signed)
This encounter was created in error - please disregard.

## 2023-08-16 MED ORDER — SILDENAFIL CITRATE 100 MG PO TABS: 100.0000 mg | ORAL_TABLET | Freq: Every day | ORAL | 11 refills | Status: DC | PRN

## 2023-08-16 NOTE — Addendum Note (Signed)
Addended by: Dorcas Carrow on: 08/16/2023 09:12 AM   Modules accepted: Orders

## 2023-08-31 ENCOUNTER — Encounter: Payer: Self-pay | Admitting: Family Medicine

## 2023-08-31 ENCOUNTER — Telehealth: Payer: Self-pay

## 2023-08-31 ENCOUNTER — Ambulatory Visit (INDEPENDENT_AMBULATORY_CARE_PROVIDER_SITE_OTHER): Payer: Medicare Other | Admitting: Family Medicine

## 2023-08-31 VITALS — BP 119/76 | HR 91 | Ht 63.25 in | Wt 174.0 lb

## 2023-08-31 DIAGNOSIS — M19049 Primary osteoarthritis, unspecified hand: Secondary | ICD-10-CM | POA: Diagnosis not present

## 2023-08-31 DIAGNOSIS — B1920 Unspecified viral hepatitis C without hepatic coma: Secondary | ICD-10-CM

## 2023-08-31 DIAGNOSIS — D692 Other nonthrombocytopenic purpura: Secondary | ICD-10-CM | POA: Diagnosis not present

## 2023-08-31 DIAGNOSIS — E038 Other specified hypothyroidism: Secondary | ICD-10-CM | POA: Diagnosis not present

## 2023-08-31 DIAGNOSIS — Z0000 Encounter for general adult medical examination without abnormal findings: Secondary | ICD-10-CM

## 2023-08-31 DIAGNOSIS — N401 Enlarged prostate with lower urinary tract symptoms: Secondary | ICD-10-CM

## 2023-08-31 DIAGNOSIS — R7301 Impaired fasting glucose: Secondary | ICD-10-CM | POA: Diagnosis not present

## 2023-08-31 DIAGNOSIS — I10 Essential (primary) hypertension: Secondary | ICD-10-CM | POA: Diagnosis not present

## 2023-08-31 DIAGNOSIS — N138 Other obstructive and reflux uropathy: Secondary | ICD-10-CM | POA: Diagnosis not present

## 2023-08-31 DIAGNOSIS — F209 Schizophrenia, unspecified: Secondary | ICD-10-CM

## 2023-08-31 DIAGNOSIS — F331 Major depressive disorder, recurrent, moderate: Secondary | ICD-10-CM

## 2023-08-31 DIAGNOSIS — E782 Mixed hyperlipidemia: Secondary | ICD-10-CM

## 2023-08-31 LAB — MICROALBUMIN, URINE WAIVED
Creatinine, Urine Waived: 200 mg/dL (ref 10–300)
Microalb, Ur Waived: 30 mg/L — ABNORMAL HIGH (ref 0–19)
Microalb/Creat Ratio: <30 mg/g (ref <30)

## 2023-08-31 LAB — BAYER DCA HB A1C WAIVED: HB A1C (BAYER DCA - WAIVED): 5.8 % — ABNORMAL HIGH (ref 4.8–5.6)

## 2023-08-31 MED ORDER — ROSUVASTATIN CALCIUM 10 MG PO TABS: 10.0000 mg | ORAL_TABLET | Freq: Every day | ORAL | 3 refills | Status: DC

## 2023-08-31 MED ORDER — HYDROXYZINE HCL 25 MG PO TABS: 25.0000 mg | ORAL_TABLET | Freq: Three times a day (TID) | ORAL | 1 refills | Status: DC | PRN

## 2023-08-31 MED ORDER — LISINOPRIL 5 MG PO TABS: 7.5000 mg | ORAL_TABLET | Freq: Every day | ORAL | 2 refills | Status: DC

## 2023-08-31 NOTE — Assessment & Plan Note (Signed)
Under good control on current regimen. Continue current regimen. Continue to monitor. Call with any concerns. Refills given. Labs drawn today.   

## 2023-08-31 NOTE — Assessment & Plan Note (Signed)
S/p treatment. Rechecking labs today.

## 2023-08-31 NOTE — Assessment & Plan Note (Signed)
Rechecking labs today. Await results. Treat as needed.  °

## 2023-08-31 NOTE — Progress Notes (Signed)
BP 119/76   Pulse 91   Ht 5' 3.25" (1.607 m)   Wt 174 lb (78.9 kg)   SpO2 96%   BMI 30.58 kg/m    Subjective:    Patient ID: Mitchell [name], male    DOB: December 21, 1955, 67 y.o.   MRN: [id number]  HPI: Mitchell [name] is a 67 y.o. male presenting on 08/31/2023 for comprehensive medical examination. Current medical complaints include:  Impaired Fasting Glucose HbA1C:  Lab Results  Component Value Date   HGBA1C 6.2 (H) 04/17/2023   Duration of elevated blood sugar: chronic Polydipsia: no Polyuria: no Weight change: no Visual disturbance: no Glucose Monitoring: no    Accucheck frequency: Not Checking Diabetic Education: Not Completed Family history of diabetes: yes  HYPERTENSION / HYPERLIPIDEMIA Satisfied with current treatment? yes Duration of hypertension: chronic BP monitoring frequency: not checking BP medication side effects: no Past BP meds: lisinopril Duration of hyperlipidemia: chronic Cholesterol medication side effects: no Cholesterol supplements: none Past cholesterol medications: crestor Medication compliance: excellent compliance Aspirin: no Recent stressors: no Recurrent headaches: no Visual changes: no Palpitations: no Dyspnea: no Chest pain: no Lower extremity edema: no Dizzy/lightheaded: no  HYPOTHYROIDISM Thyroid control status:controlled Satisfied with current treatment? yes Medication side effects: no Medication compliance: excellent compliance Recent dose adjustment:no Fatigue: no Cold intolerance: no Heat intolerance: no Weight gain: no Weight loss: no Constipation: no Diarrhea/loose stools: no Palpitations: no Lower extremity edema: no Anxiety/depressed mood: no  He currently lives with: wife Interim Problems from his last visit: no  Depression Screen done today and results listed below:     08/31/2023    2:56 PM 03/28/2023    3:30 PM 08/29/2022    3:35 PM 03/16/2022   12:08 PM 02/07/2022    3:43 PM  Depression screen PHQ 2/9   Decreased Interest 0 0 0 1 0  Down, Depressed, Hopeless 0 0 0 1 0  PHQ - 2 Score 0 0 0 2 0  Altered sleeping 1 0 0 0 0  Tired, decreased energy 0 0 0 0 0  Change in appetite 0 0 0 0 0  Feeling bad or failure about yourself  0 0 0 0 0  Trouble concentrating 0 0 0 0 0  Moving slowly or fidgety/restless 0 0 2 0 0  Suicidal thoughts 0 0 0 0 0  PHQ-9 Score 1 0 2 2 0  Difficult doing work/chores Not difficult at all Not difficult at all Not difficult at all Not difficult at all     Past Medical History:  Past Medical History:  Diagnosis Date   Anxiety    Cancer (HCC)    skin cancer   Depression    Epididymitis    Hepatitis C    Tested positive, treated now told that he does not have it   Hydrocele    Hypertension    IFG (impaired fasting glucose)    Insomnia    Kidney stone    Schizophrenia (HCC)     Surgical History:  Past Surgical History:  Procedure Laterality Date   ANKLE FRACTURE SURGERY     COLONOSCOPY  2010   COLONOSCOPY WITH PROPOFOL  06/07/2016   Procedure: COLONOSCOPY WITH PROPOFOL;  Surgeon: Midge Minium, MD;  Location: [facility]COPY;  Service: Endoscopy;;   ESOPHAGOGASTRODUODENOSCOPY (EGD) WITH PROPOFOL N/A 06/07/2016   Procedure: ESOPHAGOGASTRODUODENOSCOPY (EGD) WITH PROPOFOL;  Surgeon: Midge Minium, MD;  Location: [facility] ENDOSCOPY;  Service: Endoscopy;  Laterality: N/A;   HERNIA REPAIR  2011  X 2    Medications:  Current Outpatient Medications on File Prior to Visit  Medication Sig   levothyroxine (SYNTHROID) 50 MCG tablet Take 1 tablet (50 mcg total) by mouth daily before breakfast.   loratadine (CLARITIN) 10 MG tablet Take 10 mg by mouth daily.   Multiple Vitamin (MULTI-VITAMINS) TABS Take by mouth.   No current facility-administered medications on file prior to visit.    Allergies:  Allergies  Allergen Reactions   Haloperidol Decanoate Other (See Comments)    Muscle pain   Atorvastatin Other (See Comments)    myalgias   Codeine Nausea And  Vomiting    Social History:  Social History   Socioeconomic History   Marital status: Married    Spouse name: Inetta Fermo   Number of children: 2   Years of education: 9   Highest education level: 9th grade  Occupational History   Occupation: retired   Tobacco Use   Smoking status: Former    Current packs/day: 0.00    Types: Cigarettes    Quit date: 07/06/1995    Years since quitting: 28.1   Smokeless tobacco: Former  Building services engineer status: Never Used  Substance and Sexual Activity   Alcohol use: Not Currently    Comment: remote history, quit 20+ years ago (1 beer q 2-3 days)   Drug use: No    Comment: remote history, quit 20= years ago   Sexual activity: Yes  Other Topics Concern   Not on file  Social History Narrative   Not on file   Social Drivers of Health   Financial Resource Strain: Low Risk  (03/28/2023)   Overall Financial Resource Strain (CARDIA)    Difficulty of Paying Living Expenses: Not hard at all  Food Insecurity: No Food Insecurity (03/28/2023)   Hunger Vital Sign    Worried About Running Out of Food in the Last Year: Never true    Ran Out of Food in the Last Year: Never true  Transportation Needs: No Transportation Needs (03/28/2023)   PRAPARE - Administrator, Civil Service (Medical): No    Lack of Transportation (Non-Medical): No  Physical Activity: Insufficiently Active (03/28/2023)   Exercise Vital Sign    Days of Exercise per Week: 7 days    Minutes of Exercise per Session: 10 min  Stress: No Stress Concern Present (03/28/2023)   [employer] of Occupational Health - Occupational Stress Questionnaire    Feeling of Stress : Not at all  Social Connections: Moderately Integrated (03/28/2023)   Social Connection and Isolation Panel [NHANES]    Frequency of Communication with Friends and Family: More than three times a week    Frequency of Social Gatherings with Friends and Family: Twice a week    Attends Religious Services: More than 4  times per year    Active Member of [employer] or Organizations: No    Attends Banker Meetings: Never    Marital Status: Married  Catering manager Violence: Not At Risk (03/28/2023)   Humiliation, Afraid, Rape, and Kick questionnaire    Fear of Current or Ex-Partner: No    Emotionally Abused: No    Physically Abused: No    Sexually Abused: No   Social History   Tobacco Use  Smoking Status Former   Current packs/day: 0.00   Types: Cigarettes   Quit date: 07/06/1995   Years since quitting: 28.1  Smokeless Tobacco Former   Social History   Substance and Sexual Activity  Alcohol Use Not Currently   Comment: remote history, quit 20+ years ago (1 beer q 2-3 days)    Family History:  Family History  Problem Relation Age of Onset   Diabetes Mother    Alzheimer's disease Father    Diabetes Brother    Diabetes Maternal Grandfather     Past medical history, surgical history, medications, allergies, family history and social history reviewed with patient today and changes made to appropriate areas of the chart.   Review of Systems  Constitutional: Negative.   HENT: Negative.    Eyes:  Positive for blurred vision (cataracts). Negative for double vision, photophobia, pain, discharge and redness.  Respiratory:  Positive for shortness of breath. Negative for cough, hemoptysis, sputum production and wheezing.   Cardiovascular:  Positive for leg swelling. Negative for chest pain, palpitations, orthopnea, claudication and PND.  Gastrointestinal:  Positive for abdominal pain and heartburn. Negative for blood in stool, constipation, diarrhea, melena, nausea and vomiting.  Genitourinary: Negative.   Musculoskeletal:  Positive for joint pain. Negative for back pain, falls, myalgias and neck pain.  Skin: Negative.   Neurological: Negative.   Endo/Heme/Allergies:  Positive for environmental allergies. Negative for polydipsia. Bruises/bleeds easily.  Psychiatric/Behavioral: Negative.      All other ROS negative except what is listed above and in the HPI.      Objective:    BP 119/76   Pulse 91   Ht 5' 3.25" (1.607 m)   Wt 174 lb (78.9 kg)   SpO2 96%   BMI 30.58 kg/m   Wt Readings from Last 3 Encounters:  08/31/23 174 lb (78.9 kg)  04/17/23 170 lb 3.2 oz (77.2 kg)  03/28/23 173 lb (78.5 kg)    Physical Exam Vitals and nursing note reviewed.  Constitutional:      General: He is not in acute distress.    Appearance: Normal appearance. He is obese. He is not ill-appearing, toxic-appearing or diaphoretic.  HENT:     Head: Normocephalic and atraumatic.     Right Ear: Tympanic membrane, ear canal and external ear normal. There is no impacted cerumen.     Left Ear: Tympanic membrane, ear canal and external ear normal. There is no impacted cerumen.     Nose: Nose normal. No congestion or rhinorrhea.     Mouth/Throat:     Mouth: Mucous membranes are moist.     Pharynx: Oropharynx is clear. No oropharyngeal exudate or posterior oropharyngeal erythema.  Eyes:     General: No scleral icterus.       Right eye: No discharge.        Left eye: No discharge.     Extraocular Movements: Extraocular movements intact.     Conjunctiva/sclera: Conjunctivae normal.     Pupils: Pupils are equal, round, and reactive to light.  Neck:     Vascular: No carotid bruit.  Cardiovascular:     Rate and Rhythm: Normal rate and regular rhythm.     Pulses: Normal pulses.     Heart sounds: No murmur heard.    No friction rub. No gallop.  Pulmonary:     Effort: Pulmonary effort is normal. No respiratory distress.     Breath sounds: Normal breath sounds. No stridor. No wheezing, rhonchi or rales.  Chest:     Chest wall: No tenderness.  Abdominal:     General: Abdomen is flat. Bowel sounds are normal. There is no distension.     Palpations: Abdomen is soft. There is no mass.  Tenderness: There is no abdominal tenderness. There is no right CVA tenderness, left CVA tenderness, guarding  or rebound.     Hernia: No hernia is present.  Genitourinary:    Comments: Genital exam deferred with shared decision making Musculoskeletal:        General: No swelling, tenderness, deformity or signs of injury.     Cervical back: Normal range of motion and neck supple. No rigidity. No muscular tenderness.     Right lower leg: No edema.     Left lower leg: No edema.  Lymphadenopathy:     Cervical: No cervical adenopathy.  Skin:    General: Skin is warm and dry.     Capillary Refill: Capillary refill takes less than 2 seconds.     Coloration: Skin is not jaundiced or pale.     Findings: No bruising, erythema, lesion or rash.  Neurological:     General: No focal deficit present.     Mental Status: He is alert and oriented to person, place, and time.     Cranial Nerves: No cranial nerve deficit.     Sensory: No sensory deficit.     Motor: No weakness.     Coordination: Coordination normal.     Gait: Gait normal.     Deep Tendon Reflexes: Reflexes normal.  Psychiatric:        Mood and Affect: Mood normal.        Behavior: Behavior normal.        Thought Content: Thought content normal.        Judgment: Judgment normal.     Results for orders placed or performed in visit on 04/17/23  Bayer DCA Hb A1c Waived   Collection Time: 04/17/23 10:07 AM  Result Value Ref Range   HB A1C (BAYER DCA - WAIVED) 6.2 (H) 4.8 - 5.6 %  Basic metabolic panel   Collection Time: 04/17/23 10:08 AM  Result Value Ref Range   Glucose 108 (H) 70 - 99 mg/dL   BUN 18 8 - 27 mg/dL   Creatinine, Ser 0.96 0.76 - 1.27 mg/dL   eGFR 70 >04 VW/UJW/1.19   BUN/Creatinine Ratio 16 10 - 24   Sodium 140 134 - 144 mmol/L   Potassium 4.7 3.5 - 5.2 mmol/L   Chloride 105 96 - 106 mmol/L   CO2 21 20 - 29 mmol/L   Calcium 9.2 8.6 - 10.2 mg/dL      Assessment & Plan:   Problem List Items Addressed This Visit       Cardiovascular and Mediastinum   Hypertension   Under good control on current regimen. Continue  current regimen. Continue to monitor. Call with any concerns. Refills given. Labs drawn today.       Relevant Medications   lisinopril (ZESTRIL) 5 MG tablet   rosuvastatin (CRESTOR) 10 MG tablet   Other Relevant Orders   Comprehensive metabolic panel   CBC with Differential/Platelet   Microalbumin, Urine Waived   Senile purpura (HCC)   Reassured patient.       Relevant Medications   lisinopril (ZESTRIL) 5 MG tablet   rosuvastatin (CRESTOR) 10 MG tablet     Digestive   Hepatitis C   S/p treatment. Rechecking labs today.       Relevant Orders   Comprehensive metabolic panel   CBC with Differential/Platelet   HCV RNA quant     Endocrine   IFG (impaired fasting glucose)   Rechecking labs today. Await results. Treat as needed.  Relevant Orders   Comprehensive metabolic panel   CBC with Differential/Platelet   Microalbumin, Urine Waived   Bayer DCA Hb A1c Waived   Hypothyroidism   Rechecking labs today. Await results. Treat as needed.       Relevant Orders   Comprehensive metabolic panel   CBC with Differential/Platelet   TSH     Genitourinary   BPH with obstruction/lower urinary tract symptoms   Under good control on current regimen. Continue current regimen. Continue to monitor. Call with any concerns. Refills given. Labs drawn today.        Relevant Orders   Comprehensive metabolic panel   CBC with Differential/Platelet   PSA     Other   Depression   Doing well not on medicine. Continue to monitor. Call with any concerns.       Relevant Medications   hydrOXYzine (ATARAX) 25 MG tablet   Schizophrenia (HCC)   Doing well not on medicine. Continue to monitor.       Hyperlipidemia   Under good control on current regimen. Continue current regimen. Continue to monitor. Call with any concerns. Refills given. Labs drawn today.        Relevant Medications   lisinopril (ZESTRIL) 5 MG tablet   rosuvastatin (CRESTOR) 10 MG tablet   Other Relevant  Orders   Comprehensive metabolic panel   CBC with Differential/Platelet   Lipid Panel w/o Chol/HDL Ratio   Other Visit Diagnoses       Routine general medical examination at a health care facility    -  Primary   Vaccines up to date. Screening labs checked today. Colonoscopy up to date. Continue diet and exercise. Call with any concerns.     Hand arthritis       Referral to hand specialist placed today.   Relevant Orders   Ambulatory referral to Hand Surgery       LABORATORY TESTING:  Health maintenance labs ordered today as discussed above.   The natural history of prostate cancer and ongoing controversy regarding screening and potential treatment outcomes of prostate cancer has been discussed with the patient. The meaning of a false positive PSA and a false negative PSA has been discussed. He indicates understanding of the limitations of this screening test and wishes to proceed with screening PSA testing.   IMMUNIZATIONS:   - Tdap: Tetanus vaccination status reviewed: last tetanus booster within 10 years. - Influenza: Up to date - Pneumovax: Up to date - Prevnar: Up to date - COVID: Up to date - Shingrix vaccine: Up to date  SCREENING: - Colonoscopy: Up to date  Discussed with patient purpose of the colonoscopy is to detect colon cancer at curable precancerous or early stages   PATIENT COUNSELING:    Sexuality: Discussed sexually transmitted diseases, partner selection, use of condoms, avoidance of unintended pregnancy  and contraceptive alternatives.   Advised to avoid cigarette smoking.  I discussed with the patient that most people either abstain from alcohol or drink within safe limits (<=14/week and <=4 drinks/occasion for males, <=7/weeks and <= 3 drinks/occasion for females) and that the risk for alcohol disorders and other health effects rises proportionally with the number of drinks per week and how often a drinker exceeds daily limits.  Discussed  cessation/primary prevention of drug use and availability of treatment for abuse.   Diet: Encouraged to adjust caloric intake to maintain  or achieve ideal body weight, to reduce intake of dietary saturated fat and total fat, to limit sodium intake  by avoiding high sodium foods and not adding table salt, and to maintain adequate dietary potassium and calcium preferably from fresh fruits, vegetables, and low-fat dairy products.    stressed the importance of regular exercise  Injury prevention: Discussed safety belts, safety helmets, smoke detector, smoking near bedding or upholstery.   Dental health: Discussed importance of regular tooth brushing, flossing, and dental visits.   Follow up plan: NEXT PREVENTATIVE PHYSICAL DUE IN 1 YEAR. Return in about 6 months (around 02/29/2024).

## 2023-08-31 NOTE — Assessment & Plan Note (Signed)
Doing well not on medicine. Continue to monitor.

## 2023-08-31 NOTE — Assessment & Plan Note (Signed)
Doing well not on medicine. Continue to monitor. Call with any concerns.

## 2023-08-31 NOTE — Assessment & Plan Note (Signed)
Reassured patient. 

## 2023-08-31 NOTE — Telephone Encounter (Signed)
-----   Message from Olevia Perches sent at 08/31/2023  3:24 PM EST ----- Shingrix at ?[address]

## 2023-08-31 NOTE — Telephone Encounter (Signed)
Patient last Shingles vaccine was 06/2015. Patient was notified at his visit with provider today. Patient was informed to go to [employer] after his appointment. Patient verbalized understanding.

## 2023-09-01 LAB — HCV RNA QUANT

## 2023-09-02 LAB — CBC WITH DIFFERENTIAL/PLATELET
Basophils Absolute: 0 10*3/uL (ref 0.0–0.2)
Basos: 1 %
EOS (ABSOLUTE): 0.2 10*3/uL (ref 0.0–0.4)
Eos: 3 %
Hematocrit: 44.9 % (ref 37.5–51.0)
Hemoglobin: 15.1 g/dL (ref 13.0–17.7)
Immature Grans (Abs): 0 10*3/uL (ref 0.0–0.1)
Immature Granulocytes: 0 %
Lymphocytes Absolute: 2.5 10*3/uL (ref 0.7–3.1)
Lymphs: 38 %
MCH: 32 pg (ref 26.6–33.0)
MCHC: 33.6 g/dL (ref 31.5–35.7)
MCV: 95 fL (ref 79–97)
Monocytes Absolute: 0.6 10*3/uL (ref 0.1–0.9)
Monocytes: 9 %
Neutrophils Absolute: 3.4 10*3/uL (ref 1.4–7.0)
Neutrophils: 49 %
Platelets: 271 10*3/uL (ref 150–450)
RBC: 4.72 x10E6/uL (ref 4.14–5.80)
RDW: 12.9 % (ref 11.6–15.4)
WBC: 6.7 10*3/uL (ref 3.4–10.8)

## 2023-09-02 LAB — COMPREHENSIVE METABOLIC PANEL
ALT: 27 [IU]/L (ref 0–44)
AST: 25 [IU]/L (ref 0–40)
Albumin: 4.3 g/dL (ref 3.9–4.9)
Alkaline Phosphatase: 49 [IU]/L (ref 44–121)
BUN/Creatinine Ratio: 18 (ref 10–24)
BUN: 22 mg/dL (ref 8–27)
Bilirubin Total: 0.2 mg/dL (ref 0.0–1.2)
CO2: 20 mmol/L (ref 20–29)
Calcium: 9.4 mg/dL (ref 8.6–10.2)
Chloride: 105 mmol/L (ref 96–106)
Creatinine, Ser: 1.21 mg/dL (ref 0.76–1.27)
Globulin, Total: 3.2 g/dL (ref 1.5–4.5)
Glucose: 185 mg/dL — ABNORMAL HIGH (ref 70–99)
Potassium: 4.5 mmol/L (ref 3.5–5.2)
Sodium: 138 mmol/L (ref 134–144)
Total Protein: 7.5 g/dL (ref 6.0–8.5)
eGFR: 65 mL/min/{1.73_m2} (ref >59)

## 2023-09-02 LAB — LIPID PANEL W/O CHOL/HDL RATIO
Cholesterol, Total: 180 mg/dL (ref 100–199)
HDL: 42 mg/dL (ref >39)
LDL Chol Calc (NIH): 85 mg/dL (ref 0–99)
Triglycerides: 326 mg/dL — ABNORMAL HIGH (ref 0–149)
VLDL Cholesterol Cal: 53 mg/dL — ABNORMAL HIGH (ref 5–40)

## 2023-09-02 LAB — PSA: Prostate Specific Ag, Serum: 0.8 ng/mL (ref 0.0–4.0)

## 2023-09-02 LAB — HCV RNA QUANT

## 2023-09-02 LAB — TSH: TSH: 3.16 u[IU]/mL (ref 0.450–4.500)

## 2023-09-04 ENCOUNTER — Telehealth: Payer: Self-pay | Admitting: Family Medicine

## 2023-09-04 ENCOUNTER — Encounter: Payer: Self-pay | Admitting: Family Medicine

## 2023-09-04 NOTE — Telephone Encounter (Signed)
Spoke with patient and made him aware of Dr Henriette Combs recommendations. Patient verbalized understanding and has no further questions at this time.

## 2023-09-04 NOTE — Telephone Encounter (Signed)
This is Clinical cytogeneticist- we will call him when the sample comes in. We currently don't have any

## 2023-09-04 NOTE — Telephone Encounter (Signed)
Copied from CRM [id number]. Topic: General - Inquiry >> Sep 01, 2023  4:54 PM Mitchell [name] wrote: Reason for CRM: Pt inquiring about inhaler sample, pt unsure of the name but states Dr. Laural Benes was going to look into getting him a sample. Pt requesting to pick up sample on Monday if possible.   Please call patient back

## 2023-09-04 NOTE — Progress Notes (Signed)
Printed and mailed on 09/04/2023.

## 2023-09-05 ENCOUNTER — Telehealth: Payer: Self-pay

## 2023-09-05 NOTE — Telephone Encounter (Signed)
Pt given lab results per notes of Dr. Laural Benes on 09/04/23. Pt verbalized understanding. Notified patient copy of result were placed in the mail yesterday.   Per Letter mailed by Dr. Laural Benes: September 04, 2023   Dear Mr. Vierling,   Below are the results from your recent visit. Eshaun, everything looks nice and stable. Have a great day!

## 2023-09-11 ENCOUNTER — Telehealth: Payer: Self-pay | Admitting: Family Medicine

## 2023-09-11 NOTE — Telephone Encounter (Signed)
Patient dropped off document Handicap Placard, to be filled out by provider. Patient requested to send it back via Call Patient to pick up within 5-days. Document placed in providers tray folder. Please advise at Mobile [phone] (mobile)

## 2023-09-18 ENCOUNTER — Telehealth: Payer: Self-pay | Admitting: Family Medicine

## 2023-09-18 NOTE — Telephone Encounter (Signed)
Called patient and informed him of the medication that provider left, he stated that he will be by tomorrow to pick it up.  Did let him know that the office is closing @ 3:00 pm. tomorrow.

## 2023-09-18 NOTE — Telephone Encounter (Signed)
Pt is calling in returning a call from the office. Pt says he believes it's regarding samples he had requested. Please follow up with pt.

## 2023-09-19 ENCOUNTER — Encounter: Payer: Self-pay | Admitting: Family Medicine

## 2023-09-19 ENCOUNTER — Other Ambulatory Visit: Payer: Self-pay

## 2023-09-19 MED ORDER — BREZTRI AEROSPHERE 160-9-4.8 MCG/ACT IN AERO: 2.0000 | INHALATION_SPRAY | Freq: Two times a day (BID) | RESPIRATORY_TRACT | 0 refills | Status: DC

## 2023-09-19 NOTE — Progress Notes (Unsigned)
 Budeson-Glycopyrrol-Formoterol (BREZTRI  AEROSPHERE) 160-9-4.8 MCG/ACT AERO [[id number]]   Order Details Dose: 2 puff Route: Inhalation Frequency: 2 times daily  Dispense Quantity: 1 each Refills: 0   Indications of Use: Chronic Obstructive Pulmonary Disease       Sig: Inhale 2 puffs into the lungs in the morning and at bedtime.       Start Date: 09/19/23 End Date: --  Written Date: 09/19/23 Expiration Date: 09/18/24  Providers  Authorizing Provider: Vicci Duwaine SQUIBB, DO [address] [place] [address] Phone: [phone]   Fax: [phone] DEA #: [id number]   NPI: [id number]      Ordering User: Vicci Duwaine SQUIBB, DO         Order Class  Sample    Lot number: [id number] Exp: 11/17/2023

## 2023-10-04 ENCOUNTER — Other Ambulatory Visit: Payer: Self-pay | Admitting: Family Medicine

## 2023-10-04 DIAGNOSIS — I10 Essential (primary) hypertension: Secondary | ICD-10-CM

## 2023-10-05 NOTE — Telephone Encounter (Signed)
Requested by interface surescripts. Future visit in 4 months. Requested Prescriptions  Pending Prescriptions Disp Refills   lisinopril (ZESTRIL) 5 MG tablet [Pharmacy Med Name: Lisinopril 5 MG Oral Tablet] 135 tablet 2    Sig: TAKE 1 AND 1/2 TABLETS BY MOUTH  DAILY     Cardiovascular:  ACE Inhibitors Passed - 10/05/2023 12:28 PM      Passed - Cr in normal range and within 180 days    Creatinine  Date Value Ref Range Status  09/07/2012 1.20 0.60 - 1.30 mg/dL Final   Creatinine, Ser  Date Value Ref Range Status  08/31/2023 1.21 0.76 - 1.27 mg/dL Final         Passed - K in normal range and within 180 days    Potassium  Date Value Ref Range Status  08/31/2023 4.5 3.5 - 5.2 mmol/L Final  09/07/2012 3.7 3.5 - 5.1 mmol/L Final         Passed - Patient is not pregnant      Passed - Last BP in normal range    BP Readings from Last 1 Encounters:  08/31/23 119/76         Passed - Valid encounter within last 6 months    Recent Outpatient Visits           1 month ago Routine general medical examination at a health care facility   [facility], Megan P, DO   5 months ago IFG (impaired fasting glucose)   [facility] [place], Megan P, DO   7 months ago Primary hypertension   [place] [facility] [place], [place] P, DO   1 year ago Routine general medical examination at a health care facility   [facility] [place], [place] P, DO   1 year ago IFG (impaired fasting glucose)   [facility] Dorcas Carrow, DO       Future Appointments             In 2 months Deirdre Evener, MD [facility]   In 4 months Dorcas Carrow, DO [facility]

## 2023-10-19 ENCOUNTER — Other Ambulatory Visit: Payer: Self-pay

## 2023-10-19 NOTE — Telephone Encounter (Signed)
Patient was last seen in the office back in December of 2024. Patient is set to follow up in June of 2025. Please advise?

## 2023-10-22 MED ORDER — LEVOTHYROXINE SODIUM 50 MCG PO TABS: 50.0000 ug | ORAL_TABLET | Freq: Every day | ORAL | 3 refills | Status: DC

## 2023-12-06 ENCOUNTER — Ambulatory Visit: Payer: Medicare Other | Admitting: Dermatology

## 2023-12-30 ENCOUNTER — Emergency Department

## 2023-12-30 ENCOUNTER — Other Ambulatory Visit: Payer: Self-pay

## 2023-12-30 ENCOUNTER — Emergency Department
Admission: EM | Admit: 2023-12-30 | Discharge: 2023-12-30 | Disposition: A | Discharge status: Home / Self Care | Attending: Emergency Medicine | Admitting: Emergency Medicine

## 2023-12-30 DIAGNOSIS — K5792 Diverticulitis of intestine, part unspecified, without perforation or abscess without bleeding: Secondary | ICD-10-CM | POA: Insufficient documentation

## 2023-12-30 DIAGNOSIS — K5732 Diverticulitis of large intestine without perforation or abscess without bleeding: Secondary | ICD-10-CM | POA: Diagnosis not present

## 2023-12-30 DIAGNOSIS — R1030 Lower abdominal pain, unspecified: Secondary | ICD-10-CM | POA: Diagnosis present

## 2023-12-30 DIAGNOSIS — R1032 Left lower quadrant pain: Secondary | ICD-10-CM | POA: Diagnosis not present

## 2023-12-30 DIAGNOSIS — K760 Fatty (change of) liver, not elsewhere classified: Secondary | ICD-10-CM | POA: Diagnosis not present

## 2023-12-30 LAB — URINALYSIS, ROUTINE W REFLEX MICROSCOPIC
Bilirubin Urine: NEGATIVE
Glucose, UA: NEGATIVE mg/dL
Hgb urine dipstick: NEGATIVE
Ketones, ur: NEGATIVE mg/dL
Leukocytes,Ua: NEGATIVE
Nitrite: NEGATIVE
Protein, ur: NEGATIVE mg/dL
Specific Gravity, Urine: 1.019 (ref 1.005–1.030)
pH: 5 (ref 5.0–8.0)

## 2023-12-30 LAB — CBC
HCT: 34.9 % — ABNORMAL LOW (ref 39.0–52.0)
Hemoglobin: 11.4 g/dL — ABNORMAL LOW (ref 13.0–17.0)
MCH: 32.1 pg (ref 26.0–34.0)
MCHC: 32.7 g/dL (ref 30.0–36.0)
MCV: 98.3 fL (ref 80.0–100.0)
Platelets: 181 10*3/uL (ref 150–400)
RBC: 3.55 MIL/uL — ABNORMAL LOW (ref 4.22–5.81)
RDW: 13.2 % (ref 11.5–15.5)
WBC: 7.7 10*3/uL (ref 4.0–10.5)
nRBC: 0 % (ref 0.0–0.2)

## 2023-12-30 LAB — COMPREHENSIVE METABOLIC PANEL WITH GFR
ALT: UNDETERMINED U/L (ref 0–44)
AST: 27 U/L (ref 15–41)
Albumin: UNDETERMINED g/dL (ref 3.5–5.0)
Alkaline Phosphatase: 39 U/L (ref 38–126)
Anion gap: 10 (ref 5–15)
BUN: UNDETERMINED mg/dL (ref 8–23)
CO2: 18 mmol/L — ABNORMAL LOW (ref 22–32)
Calcium: 8.6 mg/dL — ABNORMAL LOW (ref 8.9–10.3)
Chloride: 109 mmol/L (ref 98–111)
Creatinine, Ser: UNDETERMINED mg/dL (ref 0.61–1.24)
Glucose, Bld: 90 mg/dL (ref 70–99)
Potassium: 4.3 mmol/L (ref 3.5–5.1)
Sodium: 137 mmol/L (ref 135–145)
Total Bilirubin: UNDETERMINED mg/dL (ref 0.0–1.2)
Total Protein: 7.1 g/dL (ref 6.5–8.1)

## 2023-12-30 LAB — LIPASE, BLOOD: Lipase: 96 U/L — ABNORMAL HIGH (ref 11–51)

## 2023-12-30 MED ORDER — ONDANSETRON HCL 4 MG/2ML IJ SOLN: 4.0000 mg | INTRAMUSCULAR | Status: DC

## 2023-12-30 MED ORDER — MORPHINE SULFATE (PF) 4 MG/ML IV SOLN: 4.0000 mg | Freq: Once | INTRAVENOUS | Status: DC

## 2023-12-30 MED ORDER — AMOXICILLIN-POT CLAVULANATE 875-125 MG PO TABS: 1.0000 | ORAL_TABLET | Freq: Three times a day (TID) | ORAL | 0 refills | Status: AC

## 2023-12-30 MED ORDER — AMOXICILLIN-POT CLAVULANATE 875-125 MG PO TABS
1.0000 | ORAL_TABLET | Freq: Once | ORAL | Status: AC
  Administered 2023-12-30: 1 via ORAL
  Filled 2023-12-30: qty 1

## 2023-12-30 NOTE — Discharge Instructions (Addendum)
 We believe your symptoms are caused by diverticulitis.  Most of the time this condition (please read through the included information) can be cured with outpatient antibiotics.  Please take the full course of prescribed medication(s) and follow up with the doctors recommended above.  You can also take over-the-counter ibuprofen and/or Tylenol as needed for pain according to the instructions on the labels of the bottles.  Drink plenty of fluids to make sure you stay hydrated.  Return to the ED if your abdominal pain worsens or fails to improve, you develop bloody vomiting, bloody diarrhea, you are unable to tolerate fluids due to vomiting, fever greater than 101, or other symptoms that concern you.

## 2023-12-30 NOTE — ED Provider Notes (Addendum)
 [facility] Provider Note    Event Date/Time   First MD Initiated Contact with Patient 12/30/23 [phone]     (approximate)   History   Abdominal Pain   HPI Mitchell [name] is a 68 y.o. male who presents for evaluation of gradually worsening central lower abdominal pain over the course of the day today.  He states he woke up on Friday morning with a little bit of discomfort but is gradually gotten worse.  It is in the middle and the left lower part of his abdomen.  He has had no nausea and no vomiting, no fever, no chest pain, no shortness of breath.  He had a little bit of soft stool earlier.  It feels similar to about 8 years ago when he was diagnosed with diverticulitis.  He did not need surgery.  He will have occasional sharp stabbing pains as well as the persistent dull aching pain.     Physical Exam   Triage Vital Signs: ED Triage Vitals  Encounter Vitals Group     BP 12/30/23 0313 (!) 158/96     Systolic BP Percentile --      Diastolic BP Percentile --      Pulse Rate 12/30/23 0313 75     Resp 12/30/23 0313 16     Temp 12/30/23 0313 98.2 F (36.8 C)     Temp Source 12/30/23 0313 Oral     SpO2 12/30/23 0313 98 %     Weight --      Height 12/30/23 0306 1.6 m (5\' 3" )     Head Circumference --      Peak Flow --      Pain Score 12/30/23 0305 5     Pain Loc --      Pain Education --      Exclude from Growth Chart --     Most recent vital signs: Vitals:   12/30/23 0313  BP: (!) 158/96  Pulse: 75  Resp: 16  Temp: 98.2 F (36.8 C)  SpO2: 98%    General: Awake, generally well-appearing, occasionally grimaces in pain. CV:  Good peripheral perfusion.  Regular rate and rhythm. Resp:  Normal effort. Speaking easily and comfortably, no accessory muscle usage nor intercostal retractions.   Abd:  No distention.  Tender to palpation in the suprapubic region and left lower quadrant with some guarding and some rebound.   ED Results / Procedures /  Treatments   Labs (all labs ordered are listed, but only abnormal results are displayed) Labs Reviewed  CBC - Abnormal; Notable for the following components:      Result Value   RBC 3.55 (*)    Hemoglobin 11.4 (*)    HCT 34.9 (*)    All other components within normal limits  URINALYSIS, ROUTINE W REFLEX MICROSCOPIC - Abnormal; Notable for the following components:   Color, Urine YELLOW (*)    APPearance CLEAR (*)    All other components within normal limits  LIPASE, BLOOD  COMPREHENSIVE METABOLIC PANEL WITH GFR     RADIOLOGY See ED course for details regarding CT scan results   PROCEDURES:  Critical Care performed: No  Procedures    IMPRESSION / MDM / ASSESSMENT AND PLAN / ED COURSE  I reviewed the triage vital signs and the nursing notes.  Differential diagnosis includes, but is not limited to, diverticulitis, appendicitis, biliary colic, kidney/ureteral stone  Patient's presentation is most consistent with acute presentation with potential threat to life or bodily function.  Labs/studies ordered: Urinalysis, CBC, CMP, lipase, CT of the abdomen and pelvis  Interventions/Medications given:  Medications  morphine (PF) 4 MG/ML injection 4 mg (has no administration in time range)  ondansetron (ZOFRAN) injection 4 mg (has no administration in time range)  amoxicillin-clavulanate (AUGMENTIN) 875-125 MG per tablet 1 tablet (has no administration in time range)    (Note:  hospital course my include additional interventions and/or labs/studies not listed above.)   History and exam are strongly suggestive of diverticulitis.  Vital signs stable and within normal limits and physical exam is generally reassuring.  Labs pending, and wants renal function is verified, we will proceed with CT of the abdomen pelvis.  Morphine 4 mg IV for pain and Zofran 4 mg IV to try and prevent vomiting.  Patient agrees with the plan     Clinical Course as of  12/30/23 0600  Sat Dec 30, 2023  0413 After a single failed IV attempt, the patient became extremely angry, yelling and berating his nurse.  She left the room, and shortly thereafter, registration entered to talk with him.  At that point, he began yelling at the registration representative.  I intervened and made it clear that we will not tolerate abusive behavior, and that he will be required to leave if he continues to behave in this manner.  He is currently considering if he can control his temper and behave appropriately or if he will leave the ED. [CF]  [id number] I reassessed the patient.  He is calm now.  He said that he is willing to apologize to his nurse.  I do not wish to create another incident by another IV stick and he states he absolutely does not want another needle, so we will proceed with CT scan without contrast.  I explained to him that it is not the ideal imaging modality for diverticulitis but again, he does not want an IV, and I do not want to subject my staff to his behavior. [CF]  [id number] Awaiting CT results.  Unfortunately the amount of blood obtained earlier was insufficient to get results for CMP and lipase, but given the patient's presentation and absolute refusal for additional IV sticks, I do not feel it is necessary to obtain additional lab work.  We will await the results of the CT scan [CF]  0559 CT ABDOMEN PELVIS WO CONTRAST I viewed and interpreted the CT scan and I cannot specifically see any abscess or fluid collection.  However the radiologist confirmed the presence of uncomplicated acute diverticulitis.  I reassessed the patient.  He is now calm and cooperative.  I offered pain medication and he declined.  He also declined a prescription for pain medicine and I encouraged him to use over-the-counter ibuprofen and/or Tylenol according to label instructions.  He has no known allergies to antibiotics so I will treat him with Augmentin 875/125 p.o. 3 times daily x 5 days as opposed  to Cipro/Flagyl which might have more probability for severe side effects.  I encouraged him to follow-up with his PCP at the next available opportunity and I gave him my usual return precautions.  Patient is ambulatory without any apparent pain or difficulty, able to tolerate p.o., produced a normal urine specimen, and I feel confident that he has no emergent medical condition requiring him to  stay in the hospital.  He understands and agrees with this plan. [CF]    Clinical Course User Index [CF] Lynnda Sas, MD     FINAL CLINICAL IMPRESSION(S) / ED DIAGNOSES   Final diagnoses:  Acute diverticulitis     Rx / DC Orders   ED Discharge Orders          Ordered    amoxicillin-clavulanate (AUGMENTIN) 875-125 MG tablet  3 times daily        12/30/23 0557             Note:  This document was prepared using Dragon voice recognition software and may include unintentional dictation errors.   Lynnda Sas, MD 12/30/23 1191    Lynnda Sas, MD 12/30/23 0600

## 2023-12-30 NOTE — ED Triage Notes (Signed)
 Pt to ed from home via POV for "diverticulitis". Pt has had this in the past but has been several years ago. Pt is caox4, in no acute distress and ambulatory in triage.

## 2024-01-07 ENCOUNTER — Other Ambulatory Visit: Payer: Self-pay | Admitting: Family Medicine

## 2024-01-08 ENCOUNTER — Encounter: Payer: Self-pay | Admitting: Dermatology

## 2024-01-08 ENCOUNTER — Ambulatory Visit: Admitting: Dermatology

## 2024-01-08 DIAGNOSIS — L578 Other skin changes due to chronic exposure to nonionizing radiation: Secondary | ICD-10-CM | POA: Diagnosis not present

## 2024-01-08 DIAGNOSIS — L821 Other seborrheic keratosis: Secondary | ICD-10-CM | POA: Diagnosis not present

## 2024-01-08 DIAGNOSIS — L570 Actinic keratosis: Secondary | ICD-10-CM

## 2024-01-08 DIAGNOSIS — W908XXA Exposure to other nonionizing radiation, initial encounter: Secondary | ICD-10-CM

## 2024-01-08 DIAGNOSIS — D1801 Hemangioma of skin and subcutaneous tissue: Secondary | ICD-10-CM

## 2024-01-08 DIAGNOSIS — D692 Other nonthrombocytopenic purpura: Secondary | ICD-10-CM | POA: Diagnosis not present

## 2024-01-08 DIAGNOSIS — Z1283 Encounter for screening for malignant neoplasm of skin: Secondary | ICD-10-CM | POA: Diagnosis not present

## 2024-01-08 DIAGNOSIS — L814 Other melanin hyperpigmentation: Secondary | ICD-10-CM | POA: Diagnosis not present

## 2024-01-08 DIAGNOSIS — D229 Melanocytic nevi, unspecified: Secondary | ICD-10-CM

## 2024-01-08 DIAGNOSIS — L820 Inflamed seborrheic keratosis: Secondary | ICD-10-CM | POA: Diagnosis not present

## 2024-01-08 DIAGNOSIS — L720 Epidermal cyst: Secondary | ICD-10-CM | POA: Diagnosis not present

## 2024-01-08 DIAGNOSIS — L729 Follicular cyst of the skin and subcutaneous tissue, unspecified: Secondary | ICD-10-CM

## 2024-01-08 NOTE — Progress Notes (Signed)
 Follow-Up Visit   Subjective  Mitchell [name] is a 68 y.o. male who presents for the following: Skin Cancer Screening and Upper Body Skin Exam  The patient presents for Upper Body Skin Exam (UBSE) for skin cancer screening and mole check. The patient has spots, moles and lesions to be evaluated, some may be new or changing and the patient may have concern these could be cancer.  The following portions of the chart were reviewed this encounter and updated as appropriate: medications, allergies, medical history  Review of Systems:  No other skin or systemic complaints except as noted in HPI or Assessment and Plan.  Objective  Well appearing patient in no apparent distress; mood and affect are within normal limits.  All skin waist up examined. Relevant physical exam findings are noted in the Assessment and Plan.  arms and trunk x 17 (17) Erythematous stuck-on, waxy papule or plaque L temple x 2, scalp x 1 (3) Erythematous thin papules/macules with gritty scale.   Assessment & Plan   INFLAMED SEBORRHEIC KERATOSIS (17) arms and trunk x 17 (17) Symptomatic, irritating, patient would like treated.  Destruction of lesion - arms and trunk x 17 (17) Complexity: simple   Destruction method: cryotherapy   Informed consent: discussed and consent obtained   Timeout:  patient name, date of birth, surgical site, and procedure verified Lesion destroyed using liquid nitrogen: Yes   Region frozen until ice ball extended beyond lesion: Yes   Outcome: patient tolerated procedure well with no complications   Post-procedure details: wound care instructions given   AK (ACTINIC KERATOSIS) (3) L temple x 2, scalp x 1 (3) Actinic keratoses are precancerous spots that appear secondary to cumulative UV radiation exposure/sun exposure over time. They are chronic with expected duration over 1 year. A portion of actinic keratoses will progress to squamous cell carcinoma of the skin. It is not possible to  reliably predict which spots will progress to skin cancer and so treatment is recommended to prevent development of skin cancer.  Recommend daily broad spectrum sunscreen SPF 30+ to sun-exposed areas, reapply every 2 hours as needed.  Recommend staying in the shade or wearing long sleeves, sun glasses (UVA+UVB protection) and wide brim hats (4-inch brim around the entire circumference of the hat). Call for new or changing lesions.  Destruction of lesion - L temple x 2, scalp x 1 (3) Complexity: simple   Destruction method: cryotherapy   Informed consent: discussed and consent obtained   Timeout:  patient name, date of birth, surgical site, and procedure verified Lesion destroyed using liquid nitrogen: Yes   Region frozen until ice ball extended beyond lesion: Yes   Outcome: patient tolerated procedure well with no complications   Post-procedure details: wound care instructions given   SKIN CANCER SCREENING   ACTINIC SKIN DAMAGE   CYST OF SKIN   PURPURA (HCC)   LENTIGO   MELANOCYTIC NEVUS, UNSPECIFIED LOCATION    Actinic Damage - Chronic condition, secondary to cumulative UV/sun exposure - diffuse scaly erythematous macules with underlying dyspigmentation - Recommend daily broad spectrum sunscreen SPF 30+ to sun-exposed areas, reapply every 2 hours as needed.  - Staying in the shade or wearing long sleeves, sun glasses (UVA+UVB protection) and wide brim hats (4-inch brim around the entire circumference of the hat) are also recommended for sun protection.  - Call for new or changing lesions.  Lentigines, Seborrheic Keratoses, Hemangiomas - Benign normal skin lesions - Benign-appearing - Call for any changes  Melanocytic Nevi - Tan-brown and/or pink-flesh-colored symmetric macules and papules - Benign appearing on exam today - Observation - Call clinic for new or changing moles - Recommend daily use of broad spectrum spf 30+ sunscreen to sun-exposed areas.   EPIDERMAL  INCLUSION CYST Exam: Subcutaneous nodule at L lat canthus  Benign-appearing. Exam most consistent with an epidermal inclusion cyst. Discussed that a cyst is a benign growth that can grow over time and sometimes get irritated or inflamed. Recommend observation if it is not bothersome. Discussed option of surgical excision to remove it if it is growing, symptomatic, or other changes noted. Please call for new or changing lesions so they can be evaluated.  Purpura - Chronic; persistent and recurrent.  Treatable, but not curable. - Violaceous macules and patches - Benign - Related to trauma, age, sun damage and/or use of blood thinners, chronic use of topical and/or oral steroids - Observe - Can use OTC arnica containing moisturizer such as Dermend Bruise Formula if desired - Call for worsening or other concerns  Skin cancer screening performed today.  Return in 6 months (on 07/09/2024) for UBSE recheck ISK and AK.  Arlinda Lais, CMA, am acting as scribe for Celine Collard, MD .  Documentation: I have reviewed the above documentation for accuracy and completeness, and I agree with the above.  Celine Collard, MD

## 2024-01-08 NOTE — Progress Notes (Deleted)
   Follow-Up Visit   Subjective  Mitchell [name] is a 68 y.o. male who presents for the following: Recheck previously treated lesions (Aks, ISKs) that were treated with LN2.  The patient has spots, moles and lesions to be evaluated, some may be new or changing.  The following portions of the chart were reviewed this encounter and updated as appropriate: medications, allergies, medical history  Review of Systems:  No other skin or systemic complaints except as noted in HPI or Assessment and Plan.  Objective  Well appearing patient in no apparent distress; mood and affect are within normal limits.  A focused examination was performed of the following areas: The entire upper body.  Relevant exam findings are noted in the Assessment and Plan.  Mid Back Erythematous stuck-on, waxy papule or plaque  Assessment & Plan   SEBORRHEIC KERATOSIS - Stuck-on, waxy, tan-brown papules and/or plaques  - Benign-appearing - Discussed benign etiology and prognosis. - Observe - Call for any changes  LENTIGINES Exam: scattered tan macules Due to sun exposure Treatment Plan: Benign-appearing, observe. Recommend daily broad spectrum sunscreen SPF 30+ to sun-exposed areas, reapply every 2 hours as needed.  Call for any changes  MELANOCYTIC NEVI Exam: Tan-brown and/or pink-flesh-colored symmetric macules and papules  Treatment Plan: Benign appearing on exam today. Recommend observation. Call clinic for new or changing moles. Recommend daily use of broad spectrum spf 30+ sunscreen to sun-exposed areas.   ACTINIC DAMAGE - chronic, secondary to cumulative UV radiation exposure/sun exposure over time - diffuse scaly erythematous macules with underlying dyspigmentation - Recommend daily broad spectrum sunscreen SPF 30+ to sun-exposed areas, reapply every 2 hours as needed.  - Recommend staying in the shade or wearing long sleeves, sun glasses (UVA+UVB protection) and wide brim hats (4-inch brim around  the entire circumference of the hat). - Call for new or changing lesions. INFLAMED SEBORRHEIC KERATOSIS Mid Back Symptomatic, irritating, patient would like treated.  Destruction of lesion - Mid Back Complexity: simple   Destruction method: cryotherapy   Informed consent: discussed and consent obtained   Timeout:  patient name, date of birth, surgical site, and procedure verified Lesion destroyed using liquid nitrogen: Yes   Region frozen until ice ball extended beyond lesion: Yes   Outcome: patient tolerated procedure well with no complications   Post-procedure details: wound care instructions given    Return for 6-12 mths TBSE.  Mitchell [name], CMA, am acting as scribe for Celine Collard, MD .   Documentation: I have reviewed the above documentation for accuracy and completeness, and I agree with the above.  Celine Collard, MD

## 2024-01-08 NOTE — Patient Instructions (Signed)

## 2024-01-08 NOTE — Telephone Encounter (Signed)
 Requested Prescriptions  Refused Prescriptions Disp Refills   rosuvastatin  (CRESTOR ) 10 MG tablet [Pharmacy Med Name: Rosuvastatin  Calcium  10 MG Oral Tablet] 100 tablet 2    Sig: TAKE 1 TABLET BY MOUTH DAILY     Cardiovascular:  Antilipid - Statins 2 Failed - 01/08/2024  3:41 PM      Failed - Valid encounter within last 12 months    Recent Outpatient Visits   None     Future Appointments             In 1 month Lincoln Renshaw, Megan P, DO [place] [facility]   In 7 months Elta Halter, MD  [facility]            Failed - Lipid Panel in normal range within the last 12 months    Cholesterol, Total  Date Value Ref Range Status  08/31/2023 180 100 - 199 mg/dL Final   Cholesterol [place], [place]  Date Value Ref Range Status  03/31/2016 155 <200 mg/dL Final    Comment:                            Desirable                <200                         Borderline High      200- 239                         High                     >239    LDL Chol Calc (NIH)  Date Value Ref Range Status  08/31/2023 85 0 - 99 mg/dL Final   HDL  Date Value Ref Range Status  08/31/2023 42 >39 mg/dL Final   Triglycerides  Date Value Ref Range Status  08/31/2023 326 (H) 0 - 149 mg/dL Final   Triglycerides Piccolo,Waived  Date Value Ref Range Status  03/31/2016 338 (H) <150 mg/dL Final    Comment:                            Normal                   <150                         Borderline High     150 - 199                         High                200 - 499                         Very High                >499          Passed - Cr in normal range and within 360 days    Creatinine  Date Value Ref Range Status  09/07/2012 1.20 0.60 - 1.30 mg/dL Final   Creatinine, Ser  Date Value Ref Range Status  12/30/2023 QUANTITY NOT SUFFICIENT, UNABLE TO PERFORM  TEST 0.61 - 1.24 mg/dL Final    Comment:    C/RACHEL MERKLE AT 6295 12/30/23.PMF          Passed - Patient is not pregnant

## 2024-02-14 ENCOUNTER — Other Ambulatory Visit: Payer: Self-pay | Admitting: Family Medicine

## 2024-02-16 NOTE — Telephone Encounter (Signed)
 Requested Prescriptions  Refused Prescriptions Disp Refills   rosuvastatin  (CRESTOR ) 10 MG tablet [Pharmacy Med Name: Rosuvastatin  Calcium  10 MG Oral Tablet] 40 tablet 8    Sig: TAKE 1 TABLET BY MOUTH DAILY     Cardiovascular:  Antilipid - Statins 2 Failed - 02/16/2024  3:33 PM      Failed - Valid encounter within last 12 months    Recent Outpatient Visits   None     Future Appointments             In 1 week Solomon Dupre, DO [place] [facility]   In 5 months Elta Halter, MD [place] [facility]            Failed - Lipid Panel in normal range within the last 12 months    Cholesterol, Total  Date Value Ref Range Status  08/31/2023 180 100 - 199 mg/dL Final   Cholesterol [place], [place]  Date Value Ref Range Status  03/31/2016 155 <200 mg/dL Final    Comment:                            Desirable                <200                         Borderline High      200- 239                         High                     >239    LDL Chol Calc (NIH)  Date Value Ref Range Status  08/31/2023 85 0 - 99 mg/dL Final   HDL  Date Value Ref Range Status  08/31/2023 42 >39 mg/dL Final   Triglycerides  Date Value Ref Range Status  08/31/2023 326 (H) 0 - 149 mg/dL Final   Triglycerides Piccolo,Waived  Date Value Ref Range Status  03/31/2016 338 (H) <150 mg/dL Final    Comment:                            Normal                   <150                         Borderline High     150 - 199                         High                200 - 499                         Very High                >499          Passed - Cr in normal range and within 360 days    Creatinine  Date Value Ref Range Status  09/07/2012 1.20 0.60 - 1.30 mg/dL Final   Creatinine, Ser  Date Value Ref Range Status  12/30/2023 QUANTITY NOT SUFFICIENT, UNABLE TO PERFORM  TEST 0.61 - 1.24 mg/dL Final    Comment:    C/RACHEL MERKLE AT 2130 12/30/23.PMF         Passed  - Patient is not pregnant

## 2024-02-29 ENCOUNTER — Ambulatory Visit (INDEPENDENT_AMBULATORY_CARE_PROVIDER_SITE_OTHER): Payer: Self-pay | Admitting: Family Medicine

## 2024-02-29 ENCOUNTER — Encounter: Payer: Self-pay | Admitting: Family Medicine

## 2024-02-29 VITALS — BP 127/77 | HR 85 | Ht 64.0 in | Wt 175.0 lb

## 2024-02-29 DIAGNOSIS — H9192 Unspecified hearing loss, left ear: Secondary | ICD-10-CM | POA: Diagnosis not present

## 2024-02-29 DIAGNOSIS — R7301 Impaired fasting glucose: Secondary | ICD-10-CM | POA: Diagnosis not present

## 2024-02-29 DIAGNOSIS — E038 Other specified hypothyroidism: Secondary | ICD-10-CM | POA: Diagnosis not present

## 2024-02-29 DIAGNOSIS — F331 Major depressive disorder, recurrent, moderate: Secondary | ICD-10-CM

## 2024-02-29 DIAGNOSIS — E782 Mixed hyperlipidemia: Secondary | ICD-10-CM | POA: Diagnosis not present

## 2024-02-29 DIAGNOSIS — I10 Essential (primary) hypertension: Secondary | ICD-10-CM | POA: Diagnosis not present

## 2024-02-29 DIAGNOSIS — F209 Schizophrenia, unspecified: Secondary | ICD-10-CM

## 2024-02-29 LAB — BAYER DCA HB A1C WAIVED: HB A1C (BAYER DCA - WAIVED): 6.3 % — ABNORMAL HIGH (ref 4.8–5.6)

## 2024-02-29 MED ORDER — HYDROXYZINE HCL 25 MG PO TABS: 25.0000 mg | ORAL_TABLET | Freq: Three times a day (TID) | ORAL | 1 refills | Status: DC | PRN

## 2024-02-29 MED ORDER — ROSUVASTATIN CALCIUM 10 MG PO TABS: 10.0000 mg | ORAL_TABLET | Freq: Every day | ORAL | 1 refills | Status: DC

## 2024-02-29 MED ORDER — LISINOPRIL 5 MG PO TABS: 7.5000 mg | ORAL_TABLET | Freq: Every day | ORAL | 1 refills | Status: DC

## 2024-02-29 NOTE — Progress Notes (Signed)
 BP 127/77 (BP Location: Left Arm, Patient Position: Sitting, Cuff Size: Normal)   Pulse 85   Ht 5' 4 (1.626 m)   Wt 175 lb (79.4 kg)   SpO2 96%   BMI 30.04 kg/m    Subjective:    Patient ID: Mitchell [name], male    DOB: 11/10/1955, 69 y.o.   MRN: [id number]  HPI: Mitchell [name] is a 68 y.o. male  Chief Complaint  Patient presents with   Hearing Problem    Would like referral to ENT for concerns of hearing loss   Insomnia   Anxiety   Hypertension   Hypothyroidism   Hyperlipidemia   HYPERTENSION / HYPERLIPIDEMIA Satisfied with current treatment? yes Duration of hypertension: chronic BP monitoring frequency: rarely BP medication side effects: no Past BP meds: lisinopril  Duration of hyperlipidemia: chronic Cholesterol medication side effects: no Cholesterol supplements: none Past cholesterol medications: crestor  Medication compliance: excellent compliance Aspirin : no Recent stressors: no Recurrent headaches: no Visual changes: no Palpitations: no Dyspnea: no Chest pain: no Lower extremity edema: no Dizzy/lightheaded: no  ANXIETY/DEPRESSION Duration: chronic Status:controlled Anxious mood: no  Excessive worrying: no Irritability: no  Sweating: no Nausea: no Palpitations:no Hyperventilation: no Panic attacks: no Agoraphobia: no  Obscessions/compulsions: no Depressed mood: no    02/29/2024    2:56 PM 08/31/2023    2:56 PM 03/28/2023    3:30 PM 08/29/2022    3:35 PM 03/16/2022   12:08 PM  Depression screen PHQ 2/9  Decreased Interest 0 0 0 0 1  Down, Depressed, Hopeless 0 0 0 0 1  PHQ - 2 Score 0 0 0 0 2  Altered sleeping 2 1 0 0 0  Tired, decreased energy 0 0 0 0 0  Change in appetite 0 0 0 0 0  Feeling bad or failure about yourself  0 0 0 0 0  Trouble concentrating 0 0 0 0 0  Moving slowly or fidgety/restless 0 0 0 2 0  Suicidal thoughts 0 0 0 0 0  PHQ-9 Score 2 1 0 2 2  Difficult doing work/chores Somewhat difficult Not difficult at all Not  difficult at all Not difficult at all Not difficult at all   Anhedonia: no Weight changes: no Insomnia: yes- hard to fall asleep   Hypersomnia: no Fatigue/loss of energy: no Feelings of worthlessness: no Feelings of guilt: no Impaired concentration/indecisiveness: no Suicidal ideations: no  Crying spells: no Recent Stressors/Life Changes: no   Relationship problems: no   Family stress: no     Financial stress: no    Job stress: no    Recent death/loss: no  HYPOTHYROIDISM Thyroid  control status:controlled Satisfied with current treatment? yes Medication side effects: no Medication compliance: excellent compliance Recent dose adjustment:no Fatigue: no Cold intolerance: no Heat intolerance: no Weight gain: no Weight loss: no Constipation: no Diarrhea/loose stools: no Palpitations: no Lower extremity edema: no Anxiety/depressed mood: no  Relevant past medical, surgical, family and social history reviewed and updated as indicated. Interim medical history since our last visit reviewed. Allergies and medications reviewed and updated.  Review of Systems  Constitutional: Negative.   Respiratory: Negative.    Cardiovascular: Negative.   Musculoskeletal: Negative.   Neurological: Negative.   Psychiatric/Behavioral: Negative.      Per HPI unless specifically indicated above     Objective:    BP 127/77 (BP Location: Left Arm, Patient Position: Sitting, Cuff Size: Normal)   Pulse 85   Ht 5' 4 (1.626 m)   Wt 175  lb (79.4 kg)   SpO2 96%   BMI 30.04 kg/m   Wt Readings from Last 3 Encounters:  02/29/24 175 lb (79.4 kg)  08/31/23 174 lb (78.9 kg)  04/17/23 170 lb 3.2 oz (77.2 kg)    Physical Exam Vitals and nursing note reviewed.  Constitutional:      General: He is not in acute distress.    Appearance: Normal appearance. He is not ill-appearing, toxic-appearing or diaphoretic.  HENT:     Head: Normocephalic and atraumatic.     Right Ear: External ear normal.      Left Ear: External ear normal.     Nose: Nose normal.     Mouth/Throat:     Mouth: Mucous membranes are moist.     Pharynx: Oropharynx is clear.   Eyes:     General: No scleral icterus.       Right eye: No discharge.        Left eye: No discharge.     Extraocular Movements: Extraocular movements intact.     Conjunctiva/sclera: Conjunctivae normal.     Pupils: Pupils are equal, round, and reactive to light.    Cardiovascular:     Rate and Rhythm: Normal rate and regular rhythm.     Pulses: Normal pulses.     Heart sounds: Normal heart sounds. No murmur heard.    No friction rub. No gallop.  Pulmonary:     Effort: Pulmonary effort is normal. No respiratory distress.     Breath sounds: Normal breath sounds. No stridor. No wheezing, rhonchi or rales.  Chest:     Chest wall: No tenderness.   Musculoskeletal:        General: Normal range of motion.     Cervical back: Normal range of motion and neck supple.   Skin:    General: Skin is warm and dry.     Capillary Refill: Capillary refill takes less than 2 seconds.     Coloration: Skin is not jaundiced or pale.     Findings: No bruising, erythema, lesion or rash.   Neurological:     General: No focal deficit present.     Mental Status: He is alert and oriented to person, place, and time. Mental status is at baseline.   Psychiatric:        Mood and Affect: Mood normal.        Behavior: Behavior normal.        Thought Content: Thought content normal.        Judgment: Judgment normal.     Results for orders placed or performed during the hospital encounter of 12/30/23  Urinalysis, Routine w reflex microscopic -Urine, Clean Catch   Collection Time: 12/30/23  3:07 AM  Result Value Ref Range   Color, Urine YELLOW (A) YELLOW   APPearance CLEAR (A) CLEAR   Specific Gravity, Urine 1.019 1.005 - 1.030   pH 5.0 5.0 - 8.0   Glucose, UA NEGATIVE NEGATIVE mg/dL   Hgb urine dipstick NEGATIVE NEGATIVE   Bilirubin Urine NEGATIVE  NEGATIVE   Ketones, ur NEGATIVE NEGATIVE mg/dL   Protein, ur NEGATIVE NEGATIVE mg/dL   Nitrite NEGATIVE NEGATIVE   Leukocytes,Ua NEGATIVE NEGATIVE  Lipase, blood   Collection Time: 12/30/23  3:30 AM  Result Value Ref Range   Lipase 96 (H) 11 - 51 U/L  Comprehensive metabolic panel   Collection Time: 12/30/23  3:30 AM  Result Value Ref Range   Sodium 137 135 - 145 mmol/L   Potassium 4.3  3.5 - 5.1 mmol/L   Chloride 109 98 - 111 mmol/L   CO2 18 (L) 22 - 32 mmol/L   Glucose, Bld 90 70 - 99 mg/dL   BUN QUANTITY NOT SUFFICIENT, UNABLE TO PERFORM TEST 8 - 23 mg/dL   Creatinine, Ser QUANTITY NOT SUFFICIENT, UNABLE TO PERFORM TEST 0.61 - 1.24 mg/dL   Calcium  8.6 (L) 8.9 - 10.3 mg/dL   Total Protein 7.1 6.5 - 8.1 g/dL   Albumin QUANTITY NOT SUFFICIENT, UNABLE TO PERFORM TEST 3.5 - 5.0 g/dL   AST 27 15 - 41 U/L   ALT QUANTITY NOT SUFFICIENT, UNABLE TO PERFORM TEST 0 - 44 U/L   Alkaline Phosphatase 39 38 - 126 U/L   Total Bilirubin QUANTITY NOT SUFFICIENT, UNABLE TO PERFORM TEST 0.0 - 1.2 mg/dL   GFR, Estimated NOT CALCULATED >60 mL/min   Anion gap 10 5 - 15  CBC   Collection Time: 12/30/23  3:30 AM  Result Value Ref Range   WBC 7.7 4.0 - 10.5 K/uL   RBC 3.55 (L) 4.22 - 5.81 MIL/uL   Hemoglobin 11.4 (L) 13.0 - 17.0 g/dL   HCT 91.4 (L) 78.2 - 95.6 %   MCV 98.3 80.0 - 100.0 fL   MCH 32.1 26.0 - 34.0 pg   MCHC 32.7 30.0 - 36.0 g/dL   RDW 21.3 08.6 - 57.8 %   Platelets 181 150 - 400 K/uL   nRBC 0.0 0.0 - 0.2 %      Assessment & Plan:   Problem List Items Addressed This Visit       Cardiovascular and Mediastinum   Hypertension - Primary   Under good control on current regimen. Continue current regimen. Continue to monitor. Call with any concerns. Refills given. Labs drawn today.        Relevant Medications   lisinopril  (ZESTRIL ) 5 MG tablet   rosuvastatin  (CRESTOR ) 10 MG tablet   Other Relevant Orders   CBC with Differential/Platelet   Comprehensive metabolic panel with GFR      Endocrine   IFG (impaired fasting glucose)   Up slightly with A1c of 6.3 up from 5.8. Encouraged diet and exercise. Recheck 6 months. Call with any concerns.       Relevant Orders   Bayer DCA Hb A1c Waived   CBC with Differential/Platelet   Comprehensive metabolic panel with GFR   Hypothyroidism   Rechecking labs today. Await results. Treat as needed.       Relevant Orders   CBC with Differential/Platelet   TSH   Comprehensive metabolic panel with GFR     Other   Depression   Under good control on current regimen. Continue current regimen. Continue to monitor. Call with any concerns. Refills given. Labs drawn today.       Relevant Medications   hydrOXYzine  (ATARAX ) 25 MG tablet   Other Relevant Orders   CBC with Differential/Platelet   Comprehensive metabolic panel with GFR   Schizophrenia (HCC)   Stable. No concerns.       Hyperlipidemia   Under good control on current regimen. Continue current regimen. Continue to monitor. Call with any concerns. Refills given. Labs drawn today.       Relevant Medications   lisinopril  (ZESTRIL ) 5 MG tablet   rosuvastatin  (CRESTOR ) 10 MG tablet   Other Relevant Orders   CBC with Differential/Platelet   Lipid Panel w/o Chol/HDL Ratio   Comprehensive metabolic panel with GFR   Other Visit Diagnoses  Hearing loss of left ear, unspecified hearing loss type       Referral to audiology placed today.   Relevant Orders   Ambulatory referral to Audiology        Follow up plan: Return in about 6 months (around 08/30/2024) for physical.

## 2024-02-29 NOTE — Assessment & Plan Note (Signed)
 Under good control on current regimen. Continue current regimen. Continue to monitor. Call with any concerns. Refills given. Labs drawn today.

## 2024-02-29 NOTE — Assessment & Plan Note (Signed)
 Up slightly with A1c of 6.3 up from 5.8. Encouraged diet and exercise. Recheck 6 months. Call with any concerns.

## 2024-02-29 NOTE — Assessment & Plan Note (Signed)
 Stable.  No concerns.

## 2024-02-29 NOTE — Assessment & Plan Note (Signed)
 Rechecking labs today. Await results. Treat as needed.

## 2024-03-01 ENCOUNTER — Ambulatory Visit: Payer: Self-pay

## 2024-03-01 LAB — TSH: TSH: 4.12 u[IU]/mL (ref 0.450–4.500)

## 2024-03-01 LAB — CBC WITH DIFFERENTIAL/PLATELET
Basophils Absolute: 0 10*3/uL (ref 0.0–0.2)
Basos: 1 %
EOS (ABSOLUTE): 0.3 10*3/uL (ref 0.0–0.4)
Eos: 4 %
Hematocrit: 46.1 % (ref 37.5–51.0)
Hemoglobin: 15.5 g/dL (ref 13.0–17.7)
Immature Grans (Abs): 0 10*3/uL (ref 0.0–0.1)
Immature Granulocytes: 0 %
Lymphocytes Absolute: 1.9 10*3/uL (ref 0.7–3.1)
Lymphs: 28 %
MCH: 32.9 pg (ref 26.6–33.0)
MCHC: 33.6 g/dL (ref 31.5–35.7)
MCV: 98 fL — ABNORMAL HIGH (ref 79–97)
Monocytes Absolute: 0.6 10*3/uL (ref 0.1–0.9)
Monocytes: 9 %
Neutrophils Absolute: 3.9 10*3/uL (ref 1.4–7.0)
Neutrophils: 58 %
Platelets: 267 10*3/uL (ref 150–450)
RBC: 4.71 x10E6/uL (ref 4.14–5.80)
RDW: 13.5 % (ref 11.6–15.4)
WBC: 6.7 10*3/uL (ref 3.4–10.8)

## 2024-03-01 LAB — COMPREHENSIVE METABOLIC PANEL WITH GFR
ALT: 28 IU/L (ref 0–44)
AST: 33 IU/L (ref 0–40)
Albumin: 4.3 g/dL (ref 3.9–4.9)
Alkaline Phosphatase: 58 IU/L (ref 44–121)
BUN/Creatinine Ratio: 16 (ref 10–24)
BUN: 20 mg/dL (ref 8–27)
Bilirubin Total: 0.3 mg/dL (ref 0.0–1.2)
CO2: 19 mmol/L — ABNORMAL LOW (ref 20–29)
Calcium: 9 mg/dL (ref 8.6–10.2)
Chloride: 103 mmol/L (ref 96–106)
Creatinine, Ser: 1.27 mg/dL (ref 0.76–1.27)
Globulin, Total: 3 g/dL (ref 1.5–4.5)
Glucose: 170 mg/dL — ABNORMAL HIGH (ref 70–99)
Potassium: 4.6 mmol/L (ref 3.5–5.2)
Sodium: 137 mmol/L (ref 134–144)
Total Protein: 7.3 g/dL (ref 6.0–8.5)
eGFR: 61 mL/min/{1.73_m2} (ref >59)

## 2024-03-01 LAB — LIPID PANEL W/O CHOL/HDL RATIO
Cholesterol, Total: 167 mg/dL (ref 100–199)
HDL: 39 mg/dL — ABNORMAL LOW (ref >39)
LDL Chol Calc (NIH): 74 mg/dL (ref 0–99)
Triglycerides: 341 mg/dL — ABNORMAL HIGH (ref 0–149)
VLDL Cholesterol Cal: 54 mg/dL — ABNORMAL HIGH (ref 5–40)

## 2024-03-01 NOTE — Telephone Encounter (Signed)
 Acute visit scheduled for 03/04/2024.

## 2024-03-01 NOTE — Telephone Encounter (Signed)
 RN attempted contact with patient to discuss symptoms.            Copied from CRM [id number]. Topic: Clinical - Medical Advice >> Mar 01, 2024 10:26 AM Mitchell [name] wrote: Reason for CRM: Patient called. Was seen yesterday. Wanted to leave a message for provider. Forgot to mention an issue that he has been having for 6-8 years. Sometime he has issues with his testicles swelling. They swell for a few days and go back down. Has not previously been seen for it but it has been happening for years. He would like to know what can be down to stop it. If not available when called. Can leave a text message per patient. Thank You

## 2024-03-01 NOTE — Telephone Encounter (Signed)
 E2C2 scheduling error reported for incorrect appointment scheduled using acute visit type for a chronic problem.

## 2024-03-01 NOTE — Telephone Encounter (Signed)
 FYI Only or Action Required?: FYI only for provider  Patient was last seen in primary care on 02/29/2024 by Terre Ferri P, DO. Called Nurse Triage reporting Groin Swelling. Symptoms began several years ago. Interventions attempted: Nothing. Symptoms are: unchanged.  Triage Disposition: See PCP When Office is Open (Within 3 Days)  Patient/caregiver understands and will follow disposition?: Yes Reason for Disposition  [1] Scrotum swelling AND [2] no pain  Answer Assessment - Initial Assessment Questions 1. SCROTAL SWELLING: What does the scrotum look like? How swollen is it? (mild, moderate severe; compare to other side)     mild 2. LOCATION: Where is the swelling located?     Right testicle 3. ONSET: When did the swelling start?     5-6 years 4. PATTERN: Does it come and go, or has it been constant since it started?     Comes and goes 5. SCROTAL PAIN: Is there any pain? If Yes, ask: How bad is it?  (Scale 1-10; or mild, moderate, severe)     Pain when bending over only 6/10 6. HERNIA: Has a doctor ever told you that you have a hernia?      Had hernia in umbilical area 7. OTHER SYMPTOMS: Do you have any other symptoms? (e.g., abdomen pain, difficulty passing urine, fever, vomiting)     no  Protocols used: Scrotum Swelling-A-AH

## 2024-03-03 ENCOUNTER — Ambulatory Visit: Payer: Self-pay | Admitting: Family Medicine

## 2024-03-04 ENCOUNTER — Ambulatory Visit (INDEPENDENT_AMBULATORY_CARE_PROVIDER_SITE_OTHER): Admitting: Family Medicine

## 2024-03-04 ENCOUNTER — Encounter: Payer: Self-pay | Admitting: Family Medicine

## 2024-03-04 VITALS — BP 135/84 | HR 87 | Ht 64.0 in | Wt 171.6 lb

## 2024-03-04 DIAGNOSIS — N433 Hydrocele, unspecified: Secondary | ICD-10-CM

## 2024-03-04 DIAGNOSIS — R1909 Other intra-abdominal and pelvic swelling, mass and lump: Secondary | ICD-10-CM | POA: Diagnosis not present

## 2024-03-04 NOTE — Progress Notes (Signed)
 Letter printed and mailed.

## 2024-03-04 NOTE — Progress Notes (Signed)
 BP 135/84 (BP Location: Left Arm, Patient Position: Sitting, Cuff Size: Normal)   Pulse 87   Ht 5' 4 (1.626 m)   Wt 171 lb 9.6 oz (77.8 kg)   SpO2 93%   BMI 29.46 kg/m    Subjective:    Patient ID: Mitchell [name], male    DOB: 08-22-1956, 68 y.o.   MRN: [id number]  HPI: Mitchell [name] is a 68 y.o. male  Chief Complaint  Patient presents with   Testicle Pain    Patient states that he is having pain and swelling in the right testicle on and off for years. Last about 3 to 4 days then goes away for about 3/4 weeks.    Has noticed some swelling that happens in the R side of his scrotum and his R groin on and off. He has had several USs done over the year which have shown hydroceles. No pain today. No swelling today. Generally feeling well. He was talking to one of his friends who said it was an easy fix and he's sick of it- so he came in. No other concerns or complaints at this time.   Relevant past medical, surgical, family and social history reviewed and updated as indicated. Interim medical history since our last visit reviewed. Allergies and medications reviewed and updated.  Review of Systems  Constitutional: Negative.   Respiratory: Negative.    Cardiovascular: Negative.   Genitourinary:  Negative for decreased urine volume, difficulty urinating, dysuria, enuresis, flank pain, frequency, genital sores, hematuria, penile discharge, penile pain, penile swelling, scrotal swelling, testicular pain and urgency.  Musculoskeletal: Negative.   Neurological: Negative.   Psychiatric/Behavioral: Negative.      Per HPI unless specifically indicated above     Objective:    BP 135/84 (BP Location: Left Arm, Patient Position: Sitting, Cuff Size: Normal)   Pulse 87   Ht 5' 4 (1.626 m)   Wt 171 lb 9.6 oz (77.8 kg)   SpO2 93%   BMI 29.46 kg/m   Wt Readings from Last 3 Encounters:  03/04/24 171 lb 9.6 oz (77.8 kg)  02/29/24 175 lb (79.4 kg)  08/31/23 174 lb (78.9 kg)    Physical  Exam Vitals and nursing note reviewed.  Constitutional:      General: He is not in acute distress.    Appearance: Normal appearance. He is not ill-appearing, toxic-appearing or diaphoretic.  HENT:     Head: Normocephalic and atraumatic.     Right Ear: External ear normal.     Left Ear: External ear normal.     Nose: Nose normal.     Mouth/Throat:     Mouth: Mucous membranes are moist.     Pharynx: Oropharynx is clear.   Eyes:     General: No scleral icterus.       Right eye: No discharge.        Left eye: No discharge.     Extraocular Movements: Extraocular movements intact.     Conjunctiva/sclera: Conjunctivae normal.     Pupils: Pupils are equal, round, and reactive to light.    Cardiovascular:     Rate and Rhythm: Normal rate and regular rhythm.     Pulses: Normal pulses.     Heart sounds: Normal heart sounds. No murmur heard.    No friction rub. No gallop.  Pulmonary:     Effort: Pulmonary effort is normal. No respiratory distress.     Breath sounds: Normal breath sounds. No stridor. No wheezing, rhonchi  or rales.  Chest:     Chest wall: No tenderness.  Genitourinary:    Testes:        Right: Testicular hydrocele present.    Musculoskeletal:        General: Normal range of motion.     Cervical back: Normal range of motion and neck supple.   Skin:    General: Skin is warm and dry.     Capillary Refill: Capillary refill takes less than 2 seconds.     Coloration: Skin is not jaundiced or pale.     Findings: No bruising, erythema, lesion or rash.   Neurological:     General: No focal deficit present.     Mental Status: He is alert and oriented to person, place, and time. Mental status is at baseline.   Psychiatric:        Mood and Affect: Mood normal.        Behavior: Behavior normal.        Thought Content: Thought content normal.        Judgment: Judgment normal.     Results for orders placed or performed in visit on 02/29/24  Bayer DCA Hb A1c Waived    Collection Time: 02/29/24  2:54 PM  Result Value Ref Range   HB A1C (BAYER DCA - WAIVED) 6.3 (H) 4.8 - 5.6 %  CBC with Differential/Platelet   Collection Time: 02/29/24  2:55 PM  Result Value Ref Range   WBC 6.7 3.4 - 10.8 x10E3/uL   RBC 4.71 4.14 - 5.80 x10E6/uL   Hemoglobin 15.5 13.0 - 17.7 g/dL   Hematocrit 16.1 09.6 - 51.0 %   MCV 98 (H) 79 - 97 fL   MCH 32.9 26.6 - 33.0 pg   MCHC 33.6 31.5 - 35.7 g/dL   RDW 04.5 40.9 - 81.1 %   Platelets 267 150 - 450 x10E3/uL   Neutrophils 58 Not Estab. %   Lymphs 28 Not Estab. %   Monocytes 9 Not Estab. %   Eos 4 Not Estab. %   Basos 1 Not Estab. %   Neutrophils Absolute 3.9 1.4 - 7.0 x10E3/uL   Lymphocytes Absolute 1.9 0.7 - 3.1 x10E3/uL   Monocytes Absolute 0.6 0.1 - 0.9 x10E3/uL   EOS (ABSOLUTE) 0.3 0.0 - 0.4 x10E3/uL   Basophils Absolute 0.0 0.0 - 0.2 x10E3/uL   Immature Granulocytes 0 Not Estab. %   Immature Grans (Abs) 0.0 0.0 - 0.1 x10E3/uL  Lipid Panel w/o Chol/HDL Ratio   Collection Time: 02/29/24  2:55 PM  Result Value Ref Range   Cholesterol, Total 167 100 - 199 mg/dL   Triglycerides 914 (H) 0 - 149 mg/dL   HDL 39 (L) >78 mg/dL   VLDL Cholesterol Cal 54 (H) 5 - 40 mg/dL   LDL Chol Calc (NIH) 74 0 - 99 mg/dL  TSH   Collection Time: 02/29/24  2:55 PM  Result Value Ref Range   TSH 4.120 0.450 - 4.500 uIU/mL  Comprehensive metabolic panel with GFR   Collection Time: 02/29/24  2:55 PM  Result Value Ref Range   Glucose 170 (H) 70 - 99 mg/dL   BUN 20 8 - 27 mg/dL   Creatinine, Ser 2.95 0.76 - 1.27 mg/dL   eGFR 61 >62 ZH/YQM/5.78   BUN/Creatinine Ratio 16 10 - 24   Sodium 137 134 - 144 mmol/L   Potassium 4.6 3.5 - 5.2 mmol/L   Chloride 103 96 - 106 mmol/L   CO2 19 (L) 20 -  29 mmol/L   Calcium  9.0 8.6 - 10.2 mg/dL   Total Protein 7.3 6.0 - 8.5 g/dL   Albumin 4.3 3.9 - 4.9 g/dL   Globulin, Total 3.0 1.5 - 4.5 g/dL   Bilirubin Total 0.3 0.0 - 1.2 mg/dL   Alkaline Phosphatase 58 44 - 121 IU/L   AST 33 0 - 40 IU/L   ALT  28 0 - 44 IU/L      Assessment & Plan:   Problem List Items Addressed This Visit   None Visit Diagnoses       Bilateral hydrocele    -  Primary   Referral to urology placed today. Not painful.   Relevant Orders   Ambulatory referral to Urology     Groin swelling       No palpable hernia. CT done in 4/25 doesn't show hernia. Will send to [facility] for hydrocele first. If doesn't resolve issue consider referral to general surgeon.        Follow up plan: Return for As scheduled.

## 2024-03-06 NOTE — Telephone Encounter (Unsigned)
 Copied from CRM [id number]. Topic: Clinical - Medical Advice >> Mar 06, 2024  1:21 PM Tiffini S wrote: Reason for CRM: Patient states that he was waiting to receive a text message for the urology appointment. Advised appointment that was scheduled on 04/02/24 at 3:45pm. Patient still asked to be texted with appointment information at [phone].

## 2024-03-11 NOTE — Progress Notes (Signed)
 Text sent for patient to sign up for mychart.

## 2024-03-16 ENCOUNTER — Other Ambulatory Visit: Payer: Self-pay | Admitting: Family Medicine

## 2024-03-16 DIAGNOSIS — I10 Essential (primary) hypertension: Secondary | ICD-10-CM

## 2024-03-19 ENCOUNTER — Other Ambulatory Visit: Payer: Self-pay | Admitting: Family Medicine

## 2024-03-19 NOTE — Telephone Encounter (Signed)
 Too soon for refill.  Requested Prescriptions  Pending Prescriptions Disp Refills   lisinopril  (ZESTRIL ) 5 MG tablet [Pharmacy Med Name: Lisinopril  5 MG Oral Tablet] 150 tablet 2    Sig: TAKE 1 AND 1/2 TABLETS BY MOUTH  DAILY     Cardiovascular:  ACE Inhibitors Passed - 03/19/2024  9:46 AM      Passed - Cr in normal range and within 180 days    Creatinine  Date Value Ref Range Status  09/07/2012 1.20 0.60 - 1.30 mg/dL Final   Creatinine, Ser  Date Value Ref Range Status  02/29/2024 1.27 0.76 - 1.27 mg/dL Final         Passed - K in normal range and within 180 days    Potassium  Date Value Ref Range Status  02/29/2024 4.6 3.5 - 5.2 mmol/L Final  09/07/2012 3.7 3.5 - 5.1 mmol/L Final         Passed - Patient is not pregnant      Passed - Last BP in normal range    BP Readings from Last 1 Encounters:  03/04/24 135/84         Passed - Valid encounter within last 6 months    Recent Outpatient Visits           2 weeks ago Bilateral hydrocele   [facility] [place], Megan P, DO   2 weeks ago Primary hypertension   [place] [facility] Vicci Duwaine SQUIBB, DO       Future Appointments             In 2 weeks Francisca Redell BROCKS, MD [facility] [place]   In 4 months Hester Alm BROCKS, MD [facility]

## 2024-03-21 NOTE — Telephone Encounter (Signed)
 Requested Prescriptions  Pending Prescriptions Disp Refills   rosuvastatin  (CRESTOR ) 10 MG tablet [Pharmacy Med Name: Rosuvastatin  Calcium  10 MG Oral Tablet] 40 tablet 3    Sig: TAKE 1 TABLET BY MOUTH DAILY     Cardiovascular:  Antilipid - Statins 2 Failed - 03/21/2024  1:44 PM      Failed - Lipid Panel in normal range within the last 12 months    Cholesterol, Total  Date Value Ref Range Status  02/29/2024 167 100 - 199 mg/dL Final   Cholesterol Piccolo, Waived  Date Value Ref Range Status  03/31/2016 155 <200 mg/dL Final    Comment:                            Desirable                <200                         Borderline High      200- 239                         High                     >239    LDL Chol Calc (NIH)  Date Value Ref Range Status  02/29/2024 74 0 - 99 mg/dL Final   HDL  Date Value Ref Range Status  02/29/2024 39 (L) >39 mg/dL Final   Triglycerides  Date Value Ref Range Status  02/29/2024 341 (H) 0 - 149 mg/dL Final   Triglycerides Piccolo,Waived  Date Value Ref Range Status  03/31/2016 338 (H) <150 mg/dL Final    Comment:                            Normal                   <150                         Borderline High     150 - 199                         High                200 - 499                         Very High                >499          Passed - Cr in normal range and within 360 days    Creatinine  Date Value Ref Range Status  09/07/2012 1.20 0.60 - 1.30 mg/dL Final   Creatinine, Ser  Date Value Ref Range Status  02/29/2024 1.27 0.76 - 1.27 mg/dL Final         Passed - Patient is not pregnant      Passed - Valid encounter within last 12 months    Recent Outpatient Visits           2 weeks ago Bilateral hydrocele   [facility] [place], Megan P, DO   3 weeks ago Primary hypertension   [place] [facility] Vicci Duwaine SQUIBB, DO       Future Appointments             In 1 week Francisca  Redell BROCKS, MD [facility] [place]   In 4 months Hester Alm BROCKS, MD [facility]

## 2024-03-27 DIAGNOSIS — H90A32 Mixed conductive and sensorineural hearing loss, unilateral, left ear with restricted hearing on the contralateral side: Secondary | ICD-10-CM | POA: Diagnosis not present

## 2024-03-27 DIAGNOSIS — H90A22 Sensorineural hearing loss, unilateral, left ear, with restricted hearing on the contralateral side: Secondary | ICD-10-CM | POA: Diagnosis not present

## 2024-03-27 DIAGNOSIS — J301 Allergic rhinitis due to pollen: Secondary | ICD-10-CM | POA: Diagnosis not present

## 2024-03-27 DIAGNOSIS — H6982 Other specified disorders of Eustachian tube, left ear: Secondary | ICD-10-CM | POA: Diagnosis not present

## 2024-04-01 ENCOUNTER — Telehealth: Payer: Self-pay | Admitting: Family Medicine

## 2024-04-01 NOTE — Telephone Encounter (Signed)
 Copied from CRM [id number]. Topic: Medicare AWV >> Apr 01, 2024  1:31 PM Nathanel DEL wrote: Reason for CRM: LVM 04/01/2024 reminder call for AWV appt on Tuesday 04/03/2023   Nathanel Paschal; Care Guide Ambulatory Clinical Support [place] [facility] Direct Dial: [phone]

## 2024-04-02 ENCOUNTER — Ambulatory Visit

## 2024-04-02 ENCOUNTER — Ambulatory Visit: Admitting: Urology

## 2024-04-02 VITALS — Ht 63.0 in | Wt 175.0 lb

## 2024-04-02 DIAGNOSIS — H35033 Hypertensive retinopathy, bilateral: Secondary | ICD-10-CM | POA: Diagnosis not present

## 2024-04-02 DIAGNOSIS — H2513 Age-related nuclear cataract, bilateral: Secondary | ICD-10-CM | POA: Diagnosis not present

## 2024-04-02 DIAGNOSIS — Z0000 Encounter for general adult medical examination without abnormal findings: Secondary | ICD-10-CM | POA: Diagnosis not present

## 2024-04-02 DIAGNOSIS — H25013 Cortical age-related cataract, bilateral: Secondary | ICD-10-CM | POA: Diagnosis not present

## 2024-04-02 NOTE — Patient Instructions (Signed)
 Mr. Mitchell [name] , Thank you for taking time out of your busy schedule to complete your Annual Wellness Visit with me. I enjoyed our conversation and look forward to speaking with you again next year. I, as well as your care team,  appreciate your ongoing commitment to your health goals. Please review the following plan we discussed and let me know if I can assist you in the future. Your Game plan/ To Do List    Referrals: None   Follow up Visits: Next Medicare AWV with our clinical staff: 04/08/25 @ 3:50pm (PHONE VISIT)   Have you seen your provider in the last 6 months (3 months if uncontrolled diabetes)? Yes Next Office Visit with your provider: 09/02/24 @ 3:20pm with Dr. Vicci  Clinician Recommendations: Get the covid and flu vaccines in the fall. Aim for 30 minutes of exercise or brisk walking, 6-8 glasses of water, and 5 servings of fruits and vegetables each day.       This is a list of the screening recommended for you and due dates:  Health Maintenance  Topic Date Due   Zoster (Shingles) Vaccine (1 of 2) 12/06/1973   Screening for Lung Cancer  Never done   COVID-19 Vaccine (5 - 2024-25 season) 05/20/2024*   Flu Shot  04/19/2024   Medicare Annual Wellness Visit  04/02/2025   Colon Cancer Screening  06/07/2026   DTaP/Tdap/Td vaccine (2 - Tdap) 07/27/2028   Pneumococcal Vaccine for age over 20  Completed   Hepatitis C Screening  Completed   Hepatitis B Vaccine  Aged Out   HPV Vaccine  Aged Out   Meningitis B Vaccine  Aged Out  *Topic was postponed. The date shown is not the original due date.    Advanced directives: (Copy Requested) Please bring a copy of your health care power of attorney and living will to the office to be added to your chart at your convenience. You can mail to [facility] [address] [place], [place] [address] or email to [email or web address] Advance Care Planning is important because it:  [x]  Makes sure you receive the medical care  that is consistent with your values, goals, and preferences  [x]  It provides guidance to your family and loved ones and reduces their decisional burden about whether or not they are making the right decisions based on your wishes.  Follow the link provided in your after visit summary or read over the paperwork we have mailed to you to help you started getting your Advance Directives in place. If you need assistance in completing these, please reach out to us  so that we can help you!  See attachments for Preventive Care and Fall Prevention Tips.   Fall Prevention in the Home, Adult Falls can cause injuries and affect people of all ages. There are many simple things that you can do to make your home safe and to help prevent falls. If you need it, ask for help making these changes. What actions can I take to prevent falls? General information Use good lighting in all rooms. Make sure to: Replace any light bulbs that burn out. Turn on lights if it is dark and use night-lights. Keep items that you use often in easy-to-reach places. Lower the shelves around your home if needed. Move furniture so that there are clear paths around it. Do not keep throw rugs or other things on the floor that can make you trip. If any of your floors are uneven, fix them. Add  color or contrast paint or tape to clearly mark and help you see: Grab bars or handrails. First and last steps of staircases. Where the edge of each step is. If you use a ladder or stepladder: Make sure that it is fully opened. Do not climb a closed ladder. Make sure the sides of the ladder are locked in place. Have someone hold the ladder while you use it. Know where your pets are as you move through your home. What can I do in the bathroom?     Keep the floor dry. Clean up any water that is on the floor right away. Remove soap buildup in the bathtub or shower. Buildup makes bathtubs and showers slippery. Use non-skid mats or decals on  the floor of the bathtub or shower. Attach bath mats securely with double-sided, non-slip rug tape. If you need to sit down while you are in the shower, use a non-slip stool. Install grab bars by the toilet and in the bathtub and shower. Do not use towel bars as grab bars. What can I do in the bedroom? Make sure that you have a light by your bed that is easy to reach. Do not use any sheets or blankets on your bed that hang to the floor. Have a firm bench or chair with side arms that you can use for support when you get dressed. What can I do in the kitchen? Clean up any spills right away. If you need to reach something above you, use a sturdy step stool that has a grab bar. Keep electrical cables out of the way. Do not use floor polish or wax that makes floors slippery. What can I do with my stairs? Do not leave anything on the stairs. Make sure that you have a light switch at the top and the bottom of the stairs. Have them installed if you do not have them. Make sure that there are handrails on both sides of the stairs. Fix handrails that are broken or loose. Make sure that handrails are as long as the staircases. Install non-slip stair treads on all stairs in your home if they do not have carpet. Avoid having throw rugs at the top or bottom of stairs, or secure the rugs with carpet tape to prevent them from moving. Choose a carpet design that does not hide the edge of steps on the stairs. Make sure that carpet is firmly attached to the stairs. Fix any carpet that is loose or worn. What can I do on the outside of my home? Use bright outdoor lighting. Repair the edges of walkways and driveways and fix any cracks. Clear paths of anything that can make you trip, such as tools or rocks. Add color or contrast paint or tape to clearly mark and help you see high doorway thresholds. Trim any bushes or trees on the main path into your home. Check that handrails are securely fastened and in good  repair. Both sides of all steps should have handrails. Install guardrails along the edges of any raised decks or porches. Have leaves, snow, and ice cleared regularly. Use sand, salt, or ice melt on walkways during winter months if you live where there is ice and snow. In the garage, clean up any spills right away, including grease or oil spills. What other actions can I take? Review your medicines with your health care provider. Some medicines can make you confused or feel dizzy. This can increase your chance of falling. Wear closed-toe shoes that fit  well and support your feet. Wear shoes that have rubber soles and low heels. Use a cane, walker, scooter, or crutches that help you move around if needed. Talk with your provider about other ways that you can decrease your risk of falls. This may include seeing a physical therapist to learn to do exercises to improve movement and strength. Where to find more information Centers for Disease Control and Prevention, STEADI: TonerPromos.no [employer] on Aging: BaseRingTones.pl National Institute on Aging: BaseRingTones.pl Contact a health care provider if: You are afraid of falling at home. You feel weak, drowsy, or dizzy at home. You fall at home. Get help right away if you: Lose consciousness or have trouble moving after a fall. Have a fall that causes a head injury. These symptoms may be an emergency. Get help right away. Call 911. Do not wait to see if the symptoms will go away. Do not drive yourself to the hospital. This information is not intended to replace advice given to you by your health care provider. Make sure you discuss any questions you have with your health care provider. Document Revised: 05/09/2022 Document Reviewed: 05/09/2022 Elsevier Patient Education  2024 [employer].

## 2024-04-02 NOTE — Progress Notes (Signed)
 Subjective:   Mitchell [name] is a 68 y.o. who presents for a Medicare Wellness preventive visit.  As a reminder, Annual Wellness Visits don't include a physical exam, and some assessments may be limited, especially if this visit is performed virtually. We may recommend an in-person follow-up visit with your provider if needed.  Visit Complete: Virtual I connected with  NISSIM FLEISCHER on 04/02/24 by a audio enabled telemedicine application and verified that I am speaking with the correct person using two identifiers.  Patient Location: Other:  Eye doctor's office  Provider Location: Home Office  I discussed the limitations of evaluation and management by telemedicine. The patient expressed understanding and agreed to proceed.  Vital Signs: Because this visit was a virtual/telehealth visit, some criteria may be missing or patient reported. Any vitals not documented were not able to be obtained and vitals that have been documented are patient reported.  VideoDeclined- This patient declined Librarian, academic. Therefore the visit was completed with audio only.  Persons Participating in Visit: Patient.  AWV Questionnaire: No: Patient Medicare AWV questionnaire was not completed prior to this visit.  Cardiac Risk Factors include: advanced age (>6men, >15 women);male gender;dyslipidemia;hypertension;obesity (BMI >30kg/m2)     Objective:    Today's Vitals   04/02/24 1426  Weight: 175 lb (79.4 kg)  Height: 5' 3 (1.6 m)   Body mass index is 31 kg/m.     04/02/2024    2:36 PM 12/30/2023    3:06 AM 03/28/2023    3:31 PM 03/16/2022   12:04 PM 01/25/2021    3:23 PM 06/21/2020    5:44 AM 03/24/2020    6:03 AM  Advanced Directives  Does Patient Have a Medical Advance Directive? Yes No No No No No No  Type of Advance Directive Living will        Does patient want to make changes to medical advance directive? No - Patient declined        Would patient like  information on creating a medical advance directive?  No - Patient declined No - Patient declined No - Patient declined  No - Patient declined No - Patient declined    Current Medications (verified) Outpatient Encounter Medications as of 04/02/2024  Medication Sig   Budeson-Glycopyrrol-Formoterol (BREZTRI  AEROSPHERE) 160-9-4.8 MCG/ACT AERO Inhale 2 puffs into the lungs in the morning and at bedtime.   fluticasone  (FLONASE ) 50 MCG/ACT nasal spray Place 2 sprays into both nostrils daily. (Patient taking differently: Place 2 sprays into both nostrils daily. prn)   hydrOXYzine  (ATARAX ) 25 MG tablet Take 1-2 tablets (25-50 mg total) by mouth 3 (three) times daily as needed for anxiety.   levothyroxine  (SYNTHROID ) 50 MCG tablet Take 1 tablet (50 mcg total) by mouth daily before breakfast.   lisinopril  (ZESTRIL ) 5 MG tablet Take 1.5 tablets (7.5 mg total) by mouth daily.   loratadine (CLARITIN) 10 MG tablet Take 10 mg by mouth daily.   Multiple Vitamin (MULTI-VITAMINS) TABS Take by mouth.   rosuvastatin  (CRESTOR ) 10 MG tablet TAKE 1 TABLET BY MOUTH DAILY   No facility-administered encounter medications on file as of 04/02/2024.    Allergies (verified) Haloperidol decanoate, Atorvastatin , and Codeine   History: Past Medical History:  Diagnosis Date   Anxiety    Cancer (HCC)    skin cancer   Depression    Epididymitis    Hepatitis C    Tested positive, treated now told that he does not have it   Hydrocele  Hypertension    IFG (impaired fasting glucose)    Insomnia    Kidney stone    Schizophrenia [facility])    Past Surgical History:  Procedure Laterality Date   ANKLE FRACTURE SURGERY     COLONOSCOPY  2010   COLONOSCOPY WITH PROPOFOL   06/07/2016   Procedure: COLONOSCOPY WITH PROPOFOL ;  Surgeon: Rogelia Copping, MD;  Location: [facility] ENDOSCOPY;  Service: Endoscopy;;   ESOPHAGOGASTRODUODENOSCOPY (EGD) WITH PROPOFOL  N/A 06/07/2016   Procedure: ESOPHAGOGASTRODUODENOSCOPY (EGD) WITH PROPOFOL ;   Surgeon: Rogelia Copping, MD;  Location: [facility] ENDOSCOPY;  Service: Endoscopy;  Laterality: N/A;   HERNIA REPAIR  2011   X 2   Family History  Problem Relation Age of Onset   Diabetes Mother    Alzheimer's disease Father    Diabetes Brother    Diabetes Maternal Grandfather    Social History   Socioeconomic History   Marital status: Married    Spouse name: Ellouise   Number of children: 2   Years of education: 9   Highest education level: 9th grade  Occupational History   Occupation: retired   Tobacco Use   Smoking status: Former    Current packs/day: 0.00    Average packs/day: 2.0 packs/day for 26.8 years (53.6 ttl pk-yrs)    Types: Cigarettes    Start date: 78    Quit date: 07/06/1995    Years since quitting: 28.7   Smokeless tobacco: Former    Types: Chew    Quit date: 2015  Vaping Use   Vaping status: Never Used  Substance and Sexual Activity   Alcohol use: Yes    Alcohol/week: 3.0 - 4.0 standard drinks of alcohol    Types: 3 - 4 Cans of beer per week    Comment: remote history, quit 20+ years ago (1-2 beers 1-2 days per week)   Drug use: No    Comment: remote history, quit 20= years ago   Sexual activity: Yes  Other Topics Concern   Not on file  Social History Narrative   Not on file   Social Drivers of Health   Financial Resource Strain: Low Risk  (04/02/2024)   Overall Financial Resource Strain (CARDIA)    Difficulty of Paying Living Expenses: Not hard at all  Food Insecurity: No Food Insecurity (04/02/2024)   Hunger Vital Sign    Worried About Running Out of Food in the Last Year: Never true    Ran Out of Food in the Last Year: Never true  Transportation Needs: No Transportation Needs (04/02/2024)   PRAPARE - Administrator, Civil Service (Medical): No    Lack of Transportation (Non-Medical): No  Physical Activity: Insufficiently Active (04/02/2024)   Exercise Vital Sign    Days of Exercise per Week: 7 days    Minutes of Exercise per Session: 10  min  Stress: No Stress Concern Present (04/02/2024)   [employer] of Occupational Health - Occupational Stress Questionnaire    Feeling of Stress: Not at all  Social Connections: Moderately Integrated (04/02/2024)   Social Connection and Isolation Panel    Frequency of Communication with Friends and Family: Twice a week    Frequency of Social Gatherings with Friends and Family: Twice a week    Attends Religious Services: More than 4 times per year    Active Member of [employer] or Organizations: No    Attends Banker Meetings: Never    Marital Status: Married    Tobacco Counseling Counseling given: Not Answered  Clinical Intake:  Pre-visit preparation completed: Yes  Pain : No/denies pain     BMI - recorded: 31 Nutritional Status: BMI > 30  Obese Nutritional Risks: None Diabetes: No  Lab Results  Component Value Date   HGBA1C 6.3 (H) 02/29/2024   HGBA1C 5.8 (H) 08/31/2023   HGBA1C 6.2 (H) 04/17/2023     How often do you need to have someone help you when you read instructions, pamphlets, or other written materials from your doctor or pharmacy?: 1 - Never  Interpreter Needed?: No  Information entered by :: Vina Ned, CMA   Activities of Daily Living     04/02/2024    2:28 PM  In your present state of health, do you have any difficulty performing the following activities:  Hearing? 0  Vision? 0  Difficulty concentrating or making decisions? 0  Walking or climbing stairs? 1  Comment uses a cane sometimes  Dressing or bathing? 0  Doing errands, shopping? 0  Preparing Food and eating ? N  Using the Toilet? N  In the past six months, have you accidently leaked urine? N  Do you have problems with loss of bowel control? N  Managing your Medications? N  Managing your Finances? N  Housekeeping or managing your Housekeeping? N    Patient Care Team: Vicci Duwaine SQUIBB, DO as PCP - General (Family Medicine) Mevelyn Charleston, OD as Referring  Physician (Optometry) Hester Alm BROCKS, MD (Dermatology)  I have updated your Care Teams any recent Medical Services you may have received from other providers in the past year.     Assessment:   This is a routine wellness examination for Bijan.  Hearing/Vision screen Hearing Screening - Comments:: Denies hearing loss  Vision Screening - Comments:: Gets routine eye exams, Dr. Charleston Mevelyn, Arlyss Savoy   Goals Addressed             This Visit's Progress    Patient Stated       Stay healthy       Depression Screen     04/02/2024    2:34 PM 02/29/2024    2:56 PM 08/31/2023    2:56 PM 03/28/2023    3:30 PM 08/29/2022    3:35 PM 03/16/2022   12:08 PM 02/07/2022    3:43 PM  PHQ 2/9 Scores  PHQ - 2 Score 0 0 0 0 0 2 0  PHQ- 9 Score 1 2 1  0 2 2 0    Fall Risk     04/02/2024    2:38 PM 08/31/2023    2:55 PM 03/28/2023    3:32 PM 02/28/2023    4:15 PM 08/29/2022    3:35 PM  Fall Risk   Falls in the past year? 0 0 0 0 0  Number falls in past yr: 0 0 0 0 0  Injury with Fall? 0 0 0 0 0  Risk for fall due to : History of fall(s);Impaired balance/gait;Orthopedic patient No Fall Risks No Fall Risks No Fall Risks No Fall Risks  Follow up Falls evaluation completed;Education provided Falls evaluation completed Falls prevention discussed;Falls evaluation completed  Falls evaluation completed      Data saved with a previous flowsheet row definition    MEDICARE RISK AT HOME:  Medicare Risk at Home Any stairs in or around the home?: Yes If so, are there any without handrails?: No Home free of loose throw rugs in walkways, pet beds, electrical cords, etc?: No Adequate lighting in your home to reduce  risk of falls?: Yes Life alert?: No Use of a cane, walker or w/c?: Yes (uses a cane sometimes) Grab bars in the bathroom?: Yes Shower chair or bench in shower?: Yes Elevated toilet seat or a handicapped toilet?: No  TIMED UP AND GO:  Was the test performed?  No  Cognitive  Function: 6CIT completed        04/02/2024    2:39 PM 03/28/2023    3:33 PM 03/16/2022   12:06 PM 01/25/2021    3:25 PM 01/21/2019    3:23 PM  6CIT Screen  What Year? 0 points 0 points 0 points 0 points 0 points  What month? 0 points 0 points 0 points 0 points 0 points  What time? 0 points 0 points 0 points 0 points 0 points  Count back from 20 0 points 0 points 0 points 0 points 0 points  Months in reverse 4 points 0 points 4 points 0 points 0 points  Repeat phrase 2 points 0 points 0 points 8 points 0 points  Total Score 6 points 0 points 4 points 8 points 0 points    Immunizations Immunization History  Administered Date(s) Administered   Fluad Quad(high Dose 65+) 06/26/2020   Influenza,inj,Quad PF,6+ Mos 06/08/2015, 06/27/2016, 07/18/2017, 07/03/2018   Influenza-Unspecified 07/04/2019, 06/21/2021, 06/19/2022, 06/22/2022, 06/01/2023   Janssen (J&J) SARS-COV-2 Vaccination 12/12/2019   MMR 01/25/1995   Moderna Sars-Covid-2 Vaccination 07/16/2020, 01/04/2021   Pneumococcal Conjugate-13 02/14/2020   Pneumococcal Polysaccharide-23 05/17/2013, 08/09/2021   Rsv, Bivalent, Protein Subunit Rsvpref,pf [place]) 07/11/2022   Td 07/27/2018   Unspecified SARS-COV-2 Vaccination 06/01/2023   Zoster, Live 06/24/2015    Screening Tests Health Maintenance  Topic Date Due   Zoster Vaccines- Shingrix (1 of 2) 12/06/1973   Lung Cancer Screening  Never done   COVID-19 Vaccine (5 - 2024-25 season) 05/20/2024 (Originally 11/29/2023)   INFLUENZA VACCINE  04/19/2024   Medicare Annual Wellness (AWV)  04/02/2025   Colonoscopy  06/07/2026   DTaP/Tdap/Td (2 - Tdap) 07/27/2028   Pneumococcal Vaccine: 50+ Years  Completed   Hepatitis C Screening  Completed   Hepatitis B Vaccines  Aged Out   HPV VACCINES  Aged Out   Meningococcal B Vaccine  Aged Out    Health Maintenance  Health Maintenance Due  Topic Date Due   Zoster Vaccines- Shingrix (1 of 2) 12/06/1973   Lung Cancer Screening  Never done    Health Maintenance Items Addressed: See Nurse Notes at the end of this note  Additional Screening:  Vision Screening: Recommended annual ophthalmology exams for early detection of glaucoma and other disorders of the eye. Would you like a referral to an eye doctor? No    Dental Screening: Recommended annual dental exams for proper oral hygiene  Community Resource Referral / Chronic Care Management: CRR required this visit?  No   CCM required this visit?  No   Plan:    I have personally reviewed and noted the following in the patient's chart:   Medical and social history Use of alcohol, tobacco or illicit drugs  Current medications and supplements including opioid prescriptions. Patient is not currently taking opioid prescriptions. Functional ability and status Nutritional status Physical activity Advanced directives List of other physicians Hospitalizations, surgeries, and ER visits in previous 12 months Vitals Screenings to include cognitive, depression, and falls Referrals and appointments  In addition, I have reviewed and discussed with patient certain preventive protocols, quality metrics, and best practice recommendations. A written personalized care plan for preventive services  as well as general preventive health recommendations were provided to patient.   Vina Ned, CMA   04/02/2024   After Visit Summary: (Mail) Due to this being a telephonic visit, the after visit summary with patients personalized plan was offered to patient via mail   Notes:  6 CIT Score - 6 Shingrix vaccines UTD per patient Covid and flu vaccines in the fall

## 2024-05-25 ENCOUNTER — Other Ambulatory Visit: Payer: Self-pay | Admitting: Family Medicine

## 2024-05-25 DIAGNOSIS — I10 Essential (primary) hypertension: Secondary | ICD-10-CM

## 2024-05-27 NOTE — Telephone Encounter (Signed)
 Requested Prescriptions  Refused Prescriptions Disp Refills   lisinopril  (ZESTRIL ) 5 MG tablet [Pharmacy Med Name: Lisinopril  5 MG Oral Tablet] 105 tablet 4    Sig: TAKE 1 AND 1/2 TABLETS BY MOUTH  DAILY     Cardiovascular:  ACE Inhibitors Passed - 05/27/2024  3:17 PM      Passed - Cr in normal range and within 180 days    Creatinine  Date Value Ref Range Status  09/07/2012 1.20 0.60 - 1.30 mg/dL Final   Creatinine, Ser  Date Value Ref Range Status  02/29/2024 1.27 0.76 - 1.27 mg/dL Final         Passed - K in normal range and within 180 days    Potassium  Date Value Ref Range Status  02/29/2024 4.6 3.5 - 5.2 mmol/L Final  09/07/2012 3.7 3.5 - 5.1 mmol/L Final         Passed - Patient is not pregnant      Passed - Last BP in normal range    BP Readings from Last 1 Encounters:  03/04/24 135/84         Passed - Valid encounter within last 6 months    Recent Outpatient Visits           2 months ago Bilateral hydrocele   [place] [facility] [place], DO   2 months ago Primary hypertension   [place] [facility] Vicci Duwaine SQUIBB, DO       Future Appointments             In 2 months Hester Alm BROCKS, MD [facility]             rosuvastatin  (CRESTOR ) 10 MG tablet [Pharmacy Med Name: Rosuvastatin  Calcium  10 MG Oral Tablet] 100 tablet 2    Sig: TAKE 1 TABLET BY MOUTH DAILY     Cardiovascular:  Antilipid - Statins 2 Failed - 05/27/2024  3:17 PM      Failed - Lipid Panel in normal range within the last 12 months    Cholesterol, Total  Date Value Ref Range Status  02/29/2024 167 100 - 199 mg/dL Final   Cholesterol Piccolo, Waived  Date Value Ref Range Status  03/31/2016 155 <200 mg/dL Final    Comment:                            Desirable                <200                         Borderline High      200- 239                         High                     >239    LDL Chol Calc (NIH)  Date Value  Ref Range Status  02/29/2024 74 0 - 99 mg/dL Final   HDL  Date Value Ref Range Status  02/29/2024 39 (L) >39 mg/dL Final   Triglycerides  Date Value Ref Range Status  02/29/2024 341 (H) 0 - 149 mg/dL Final   Triglycerides Piccolo,Waived  Date Value Ref Range Status  03/31/2016 338 (H) <150 mg/dL Final    Comment:  Normal                   <150                         Borderline High     150 - 199                         High                200 - 499                         Very High                >499          Passed - Cr in normal range and within 360 days    Creatinine  Date Value Ref Range Status  09/07/2012 1.20 0.60 - 1.30 mg/dL Final   Creatinine, Ser  Date Value Ref Range Status  02/29/2024 1.27 0.76 - 1.27 mg/dL Final         Passed - Patient is not pregnant      Passed - Valid encounter within last 12 months    Recent Outpatient Visits           2 months ago Bilateral hydrocele   [place] [facility] [place], DO   2 months ago Primary hypertension   [place] [facility] Vicci Duwaine SQUIBB, DO       Future Appointments             In 2 months Hester Alm BROCKS, MD [facility]

## 2024-06-14 ENCOUNTER — Other Ambulatory Visit: Payer: Self-pay | Admitting: Family Medicine

## 2024-06-14 DIAGNOSIS — I10 Essential (primary) hypertension: Secondary | ICD-10-CM

## 2024-06-18 NOTE — Telephone Encounter (Signed)
 Too soon for refill.  Requested Prescriptions  Pending Prescriptions Disp Refills   rosuvastatin  (CRESTOR ) 10 MG tablet [Pharmacy Med Name: Rosuvastatin  Calcium  10 MG Oral Tablet] 100 tablet 2    Sig: TAKE 1 TABLET BY MOUTH DAILY     Cardiovascular:  Antilipid - Statins 2 Failed - 06/18/2024  8:34 AM      Failed - Lipid Panel in normal range within the last 12 months    Cholesterol, Total  Date Value Ref Range Status  02/29/2024 167 100 - 199 mg/dL Final   Cholesterol Piccolo, Waived  Date Value Ref Range Status  03/31/2016 155 <200 mg/dL Final    Comment:                            Desirable                <200                         Borderline High      200- 239                         High                     >239    LDL Chol Calc (NIH)  Date Value Ref Range Status  02/29/2024 74 0 - 99 mg/dL Final   HDL  Date Value Ref Range Status  02/29/2024 39 (L) >39 mg/dL Final   Triglycerides  Date Value Ref Range Status  02/29/2024 341 (H) 0 - 149 mg/dL Final   Triglycerides Piccolo,Waived  Date Value Ref Range Status  03/31/2016 338 (H) <150 mg/dL Final    Comment:                            Normal                   <150                         Borderline High     150 - 199                         High                200 - 499                         Very High                >499          Passed - Cr in normal range and within 360 days    Creatinine  Date Value Ref Range Status  09/07/2012 1.20 0.60 - 1.30 mg/dL Final   Creatinine, Ser  Date Value Ref Range Status  02/29/2024 1.27 0.76 - 1.27 mg/dL Final         Passed - Patient is not pregnant      Passed - Valid encounter within last 12 months    Recent Outpatient Visits           3 months ago Bilateral hydrocele   [place] [facility] [place], Megan P, DO   3 months ago Primary hypertension  [place] [facility] [place], Duwaine SQUIBB, DO       Future Appointments              In 1 month Hester Alm BROCKS, MD [place] [facility]             lisinopril  (ZESTRIL ) 5 MG tablet [Pharmacy Med Name: Lisinopril  5 MG Oral Tablet] 105 tablet 4    Sig: TAKE 1 AND 1/2 TABLETS BY MOUTH  DAILY     Cardiovascular:  ACE Inhibitors Passed - 06/18/2024  8:34 AM      Passed - Cr in normal range and within 180 days    Creatinine  Date Value Ref Range Status  09/07/2012 1.20 0.60 - 1.30 mg/dL Final   Creatinine, Ser  Date Value Ref Range Status  02/29/2024 1.27 0.76 - 1.27 mg/dL Final         Passed - K in normal range and within 180 days    Potassium  Date Value Ref Range Status  02/29/2024 4.6 3.5 - 5.2 mmol/L Final  09/07/2012 3.7 3.5 - 5.1 mmol/L Final         Passed - Patient is not pregnant      Passed - Last BP in normal range    BP Readings from Last 1 Encounters:  03/04/24 135/84         Passed - Valid encounter within last 6 months    Recent Outpatient Visits           3 months ago Bilateral hydrocele   [place] [facility] [place], [place], DO   3 months ago Primary hypertension   [place] [facility] Vicci Duwaine SQUIBB, DO       Future Appointments             In 1 month Hester Alm BROCKS, MD [facility] [place] [facility]

## 2024-07-01 ENCOUNTER — Other Ambulatory Visit: Payer: Self-pay | Admitting: Family Medicine

## 2024-07-01 DIAGNOSIS — I10 Essential (primary) hypertension: Secondary | ICD-10-CM

## 2024-07-02 ENCOUNTER — Other Ambulatory Visit: Payer: Self-pay | Admitting: Family Medicine

## 2024-07-04 NOTE — Telephone Encounter (Signed)
 Requested Prescriptions  Pending Prescriptions Disp Refills   levothyroxine  (SYNTHROID ) 50 MCG tablet [Pharmacy Med Name: Levothyroxine  Sodium 50 MCG Oral Tablet] 100 tablet 2    Sig: TAKE 1 TABLET BY MOUTH DAILY  BEFORE BREAKFAST     Endocrinology:  Hypothyroid Agents Passed - 07/04/2024  3:19 PM      Passed - TSH in normal range and within 360 days    TSH  Date Value Ref Range Status  02/29/2024 4.120 0.450 - 4.500 uIU/mL Final         Passed - Valid encounter within last 12 months    Recent Outpatient Visits           4 months ago Bilateral hydrocele   [place] [facility] [place], Megan P, DO   4 months ago Primary hypertension   [place] [facility] [place], Duwaine SQUIBB, DO       Future Appointments             In 1 month Hester Alm BROCKS, MD [facility]

## 2024-07-04 NOTE — Telephone Encounter (Signed)
 Rx 02/29/24 #150 1RF- too soon Requested Prescriptions  Pending Prescriptions Disp Refills   lisinopril  (ZESTRIL ) 5 MG tablet [Pharmacy Med Name: Lisinopril  5 MG Oral Tablet] 105 tablet 4    Sig: TAKE 1 AND 1/2 TABLETS BY MOUTH  DAILY     Cardiovascular:  ACE Inhibitors Passed - 07/04/2024  9:55 AM      Passed - Cr in normal range and within 180 days    Creatinine  Date Value Ref Range Status  09/07/2012 1.20 0.60 - 1.30 mg/dL Final   Creatinine, Ser  Date Value Ref Range Status  02/29/2024 1.27 0.76 - 1.27 mg/dL Final         Passed - K in normal range and within 180 days    Potassium  Date Value Ref Range Status  02/29/2024 4.6 3.5 - 5.2 mmol/L Final  09/07/2012 3.7 3.5 - 5.1 mmol/L Final         Passed - Patient is not pregnant      Passed - Last BP in normal range    BP Readings from Last 1 Encounters:  03/04/24 135/84         Passed - Valid encounter within last 6 months    Recent Outpatient Visits           4 months ago Bilateral hydrocele   [place] [facility] [place], [place], DO   4 months ago Primary hypertension   [place] [facility] Vicci Duwaine SQUIBB, DO       Future Appointments             In 1 month Hester Alm BROCKS, MD [facility] [place] [facility]

## 2024-07-27 ENCOUNTER — Other Ambulatory Visit: Payer: Self-pay | Admitting: Family Medicine

## 2024-07-29 NOTE — Telephone Encounter (Signed)
 Requested Prescriptions  Pending Prescriptions Disp Refills   rosuvastatin  (CRESTOR ) 10 MG tablet [Pharmacy Med Name: Rosuvastatin  Calcium  10 MG Oral Tablet] 90 tablet 0    Sig: TAKE 1 TABLET BY MOUTH DAILY     Cardiovascular:  Antilipid - Statins 2 Failed - 07/29/2024  4:29 PM      Failed - Lipid Panel in normal range within the last 12 months    Cholesterol, Total  Date Value Ref Range Status  02/29/2024 167 100 - 199 mg/dL Final   Cholesterol Piccolo, Waived  Date Value Ref Range Status  03/31/2016 155 <200 mg/dL Final    Comment:                            Desirable                <200                         Borderline High      200- 239                         High                     >239    LDL Chol Calc (NIH)  Date Value Ref Range Status  02/29/2024 74 0 - 99 mg/dL Final   HDL  Date Value Ref Range Status  02/29/2024 39 (L) >39 mg/dL Final   Triglycerides  Date Value Ref Range Status  02/29/2024 341 (H) 0 - 149 mg/dL Final   Triglycerides Piccolo,Waived  Date Value Ref Range Status  03/31/2016 338 (H) <150 mg/dL Final    Comment:                            Normal                   <150                         Borderline High     150 - 199                         High                200 - 499                         Very High                >499          Passed - Cr in normal range and within 360 days    Creatinine  Date Value Ref Range Status  09/07/2012 1.20 0.60 - 1.30 mg/dL Final   Creatinine, Ser  Date Value Ref Range Status  02/29/2024 1.27 0.76 - 1.27 mg/dL Final         Passed - Patient is not pregnant      Passed - Valid encounter within last 12 months    Recent Outpatient Visits           4 months ago Bilateral hydrocele   [place] [facility] [place], Megan P, DO   5 months ago Primary hypertension   [place] [facility] Vicci Duwaine SQUIBB, DO       Future Appointments             In 1 week  Hester Alm BROCKS, MD [facility] [place] [facility]

## 2024-08-05 ENCOUNTER — Ambulatory Visit: Admitting: Dermatology

## 2024-08-05 DIAGNOSIS — L918 Other hypertrophic disorders of the skin: Secondary | ICD-10-CM | POA: Diagnosis not present

## 2024-08-05 DIAGNOSIS — L570 Actinic keratosis: Secondary | ICD-10-CM

## 2024-08-05 DIAGNOSIS — W908XXA Exposure to other nonionizing radiation, initial encounter: Secondary | ICD-10-CM

## 2024-08-05 DIAGNOSIS — L814 Other melanin hyperpigmentation: Secondary | ICD-10-CM

## 2024-08-05 DIAGNOSIS — Z1283 Encounter for screening for malignant neoplasm of skin: Secondary | ICD-10-CM | POA: Diagnosis not present

## 2024-08-05 DIAGNOSIS — L820 Inflamed seborrheic keratosis: Secondary | ICD-10-CM | POA: Diagnosis not present

## 2024-08-05 DIAGNOSIS — L578 Other skin changes due to chronic exposure to nonionizing radiation: Secondary | ICD-10-CM | POA: Diagnosis not present

## 2024-08-05 DIAGNOSIS — D1801 Hemangioma of skin and subcutaneous tissue: Secondary | ICD-10-CM

## 2024-08-05 DIAGNOSIS — L821 Other seborrheic keratosis: Secondary | ICD-10-CM

## 2024-08-05 DIAGNOSIS — D229 Melanocytic nevi, unspecified: Secondary | ICD-10-CM

## 2024-08-05 NOTE — Progress Notes (Unsigned)
 Follow-Up Visit   Subjective  Mitchell [name] is a 68 y.o. male who presents for the following: Skin Cancer Screening and Upper Body Skin Exam The patient presents for Upper Body Skin Exam (UBSE) for skin cancer screening and mole check. The patient has spots, moles and lesions to be evaluated, some may be new or changing and the patient may have concern these could be cancer.  The following portions of the chart were reviewed this encounter and updated as appropriate: medications, allergies, medical history  Review of Systems:  No other skin or systemic complaints except as noted in HPI or Assessment and Plan.  Objective  Well appearing patient in no apparent distress; mood and affect are within normal limits.  All skin waist up examined. Relevant physical exam findings are noted in the Assessment and Plan.  arms and trunk x 30 (30) Erythematous stuck-on, waxy papule or plaque L temple x 1, L zygoma lat infraorbital x 1 (2) Erythematous thin papules/macules with gritty scale.  Left Axilla Fleshy, skin-colored pedunculated papules.    Assessment & Plan   INFLAMED SEBORRHEIC KERATOSIS (30) arms and trunk x 30 (30) Symptomatic, irritating, patient would like treated.  Destruction of lesion - arms and trunk x 30 (30) Complexity: simple   Destruction method: cryotherapy   Informed consent: discussed and consent obtained   Timeout:  patient name, date of birth, surgical site, and procedure verified Lesion destroyed using liquid nitrogen: Yes   Region frozen until ice ball extended beyond lesion: Yes   Outcome: patient tolerated procedure well with no complications   Post-procedure details: wound care instructions given    AK (ACTINIC KERATOSIS) (2) L temple x 1, L zygoma lat infraorbital x 1 (2) Actinic keratoses are precancerous spots that appear secondary to cumulative UV radiation exposure/sun exposure over time. They are chronic with expected duration over 1 year. A portion  of actinic keratoses will progress to squamous cell carcinoma of the skin. It is not possible to reliably predict which spots will progress to skin cancer and so treatment is recommended to prevent development of skin cancer.  Recommend daily broad spectrum sunscreen SPF 30+ to sun-exposed areas, reapply every 2 hours as needed.  Recommend staying in the shade or wearing long sleeves, sun glasses (UVA+UVB protection) and wide brim hats (4-inch brim around the entire circumference of the hat). Call for new or changing lesions.  Destruction of lesion - L temple x 1, L zygoma lat infraorbital x 1 (2) Complexity: simple   Destruction method: cryotherapy   Informed consent: discussed and consent obtained   Timeout:  patient name, date of birth, surgical site, and procedure verified Lesion destroyed using liquid nitrogen: Yes   Region frozen until ice ball extended beyond lesion: Yes   Outcome: patient tolerated procedure well with no complications   Post-procedure details: wound care instructions given    SKIN TAG Left Axilla Acrochordons (Skin Tags) - Fleshy, skin-colored pedunculated papules - Benign appearing.  - Observe. - If desired, they can be removed with an in office procedure that is not covered by insurance. - Please call the clinic if you notice any new or changing lesions.   Actinic Damage - Chronic condition, secondary to cumulative UV/sun exposure - diffuse scaly erythematous macules with underlying dyspigmentation - Recommend daily broad spectrum sunscreen SPF 30+ to sun-exposed areas, reapply every 2 hours as needed.  - Staying in the shade or wearing long sleeves, sun glasses (UVA+UVB protection) and wide brim hats (4-inch  brim around the entire circumference of the hat) are also recommended for sun protection.  - Call for new or changing lesions.  Lentigines, Seborrheic Keratoses, Hemangiomas - Benign normal skin lesions - Benign-appearing - Call for any  changes  Melanocytic Nevi - Tan-brown and/or pink-flesh-colored symmetric macules and papules - Benign appearing on exam today - Observation - Call clinic for new or changing moles - Recommend daily use of broad spectrum spf 30+ sunscreen to sun-exposed areas.   Skin cancer screening performed today.  Return in about 6 months (around 02/02/2025) for ISK and AK follow up.  LILLETTE Rosina Mayans, CMA, am acting as scribe for Alm Rhyme, MD .   Documentation: I have reviewed the above documentation for accuracy and completeness, and I agree with the above.  Alm Rhyme, MD

## 2024-08-05 NOTE — Patient Instructions (Signed)

## 2024-08-06 ENCOUNTER — Encounter: Payer: Self-pay | Admitting: Dermatology

## 2024-08-29 ENCOUNTER — Other Ambulatory Visit: Payer: Self-pay | Admitting: Family Medicine

## 2024-08-29 DIAGNOSIS — I10 Essential (primary) hypertension: Secondary | ICD-10-CM

## 2024-08-30 ENCOUNTER — Telehealth: Payer: Self-pay

## 2024-08-30 NOTE — Progress Notes (Signed)
° °  08/30/2024  Patient ID: Mitchell [name], male   DOB: Feb 15, 1956, 68 y.o.   MRN: [id number]  This patient is appearing on a report for being at risk of failing the adherence measure for hypertension (ACEi/ARB) medications this calendar year.   Medication: lisinopril  7.5mg  daily  Last fill date: 04/15/24 for 70 day supply  Pharmacy has sent a refill request for PCP today and order is pending to be signed for a 1 year refill.  Mitchell [name], PharmD, DPLA

## 2024-09-02 ENCOUNTER — Ambulatory Visit (INDEPENDENT_AMBULATORY_CARE_PROVIDER_SITE_OTHER): Admitting: Family Medicine

## 2024-09-02 ENCOUNTER — Encounter: Payer: Self-pay | Admitting: Family Medicine

## 2024-09-02 VITALS — BP 137/82 | HR 88 | Temp 98.0°F | Ht 63.0 in | Wt 176.8 lb

## 2024-09-02 DIAGNOSIS — E782 Mixed hyperlipidemia: Secondary | ICD-10-CM

## 2024-09-02 DIAGNOSIS — Z0000 Encounter for general adult medical examination without abnormal findings: Secondary | ICD-10-CM | POA: Diagnosis not present

## 2024-09-02 DIAGNOSIS — F419 Anxiety disorder, unspecified: Secondary | ICD-10-CM | POA: Diagnosis not present

## 2024-09-02 DIAGNOSIS — E038 Other specified hypothyroidism: Secondary | ICD-10-CM

## 2024-09-02 DIAGNOSIS — N138 Other obstructive and reflux uropathy: Secondary | ICD-10-CM | POA: Diagnosis not present

## 2024-09-02 DIAGNOSIS — R7301 Impaired fasting glucose: Secondary | ICD-10-CM | POA: Diagnosis not present

## 2024-09-02 DIAGNOSIS — N401 Enlarged prostate with lower urinary tract symptoms: Secondary | ICD-10-CM

## 2024-09-02 DIAGNOSIS — I10 Essential (primary) hypertension: Secondary | ICD-10-CM

## 2024-09-02 DIAGNOSIS — B1920 Unspecified viral hepatitis C without hepatic coma: Secondary | ICD-10-CM | POA: Diagnosis not present

## 2024-09-02 DIAGNOSIS — F331 Major depressive disorder, recurrent, moderate: Secondary | ICD-10-CM | POA: Diagnosis not present

## 2024-09-02 LAB — MICROALBUMIN, URINE WAIVED
Creatinine, Urine Waived: 200 mg/dL (ref 10–300)
Microalb, Ur Waived: 30 mg/L — ABNORMAL HIGH (ref 0–19)
Microalb/Creat Ratio: <30 mg/g (ref <30)

## 2024-09-02 LAB — BAYER DCA HB A1C WAIVED: HB A1C (BAYER DCA - WAIVED): 6.3 % — ABNORMAL HIGH (ref 4.8–5.6)

## 2024-09-02 MED ORDER — HYDROXYZINE HCL 25 MG PO TABS: 25.0000 mg | ORAL_TABLET | Freq: Three times a day (TID) | ORAL | 1 refills | Status: AC | PRN

## 2024-09-02 MED ORDER — BREZTRI AEROSPHERE 160-9-4.8 MCG/ACT IN AERO: 2.0000 | INHALATION_SPRAY | Freq: Two times a day (BID) | RESPIRATORY_TRACT | 12 refills | Status: AC

## 2024-09-02 MED ORDER — LISINOPRIL 5 MG PO TABS: 7.5000 mg | ORAL_TABLET | Freq: Every day | ORAL | 1 refills | Status: AC

## 2024-09-02 MED ORDER — ROSUVASTATIN CALCIUM 10 MG PO TABS: 10.0000 mg | ORAL_TABLET | Freq: Every day | ORAL | 1 refills | Status: AC

## 2024-09-02 NOTE — Assessment & Plan Note (Signed)
 Under good control on current regimen. Continue current regimen. Continue to monitor. Call with any concerns. Refills given. Labs drawn today.

## 2024-09-02 NOTE — Assessment & Plan Note (Signed)
 Rechecking labs today. Await results. Treat as needed.

## 2024-09-02 NOTE — Telephone Encounter (Signed)
 Requested medications are due for refill today.  yes  Requested medications are on the active medications list.  yes  Last refill. 02/29/2024 #150 0 rf  Future visit scheduled.   today  Notes to clinic.  Labs are expired.    Requested Prescriptions  Pending Prescriptions Disp Refills   lisinopril  (ZESTRIL ) 5 MG tablet [Pharmacy Med Name: Lisinopril  5 MG Oral Tablet] 105 tablet 4    Sig: TAKE 1 AND 1/2 TABLETS BY MOUTH  DAILY     Cardiovascular:  ACE Inhibitors Failed - 09/02/2024 11:31 AM      Failed - Cr in normal range and within 180 days    Creatinine  Date Value Ref Range Status  09/07/2012 1.20 0.60 - 1.30 mg/dL Final   Creatinine, Ser  Date Value Ref Range Status  02/29/2024 1.27 0.76 - 1.27 mg/dL Final         Failed - K in normal range and within 180 days    Potassium  Date Value Ref Range Status  02/29/2024 4.6 3.5 - 5.2 mmol/L Final  09/07/2012 3.7 3.5 - 5.1 mmol/L Final         Passed - Patient is not pregnant      Passed - Last BP in normal range    BP Readings from Last 1 Encounters:  03/04/24 135/84         Passed - Valid encounter within last 6 months    Recent Outpatient Visits           6 months ago Bilateral hydrocele   [place] [facility] [place], Megan P, DO   6 months ago Primary hypertension   [place] [facility] Vicci Duwaine SQUIBB, DO       Future Appointments             In 4 months Hester Alm BROCKS, MD [facility] [place] [facility]

## 2024-09-02 NOTE — Progress Notes (Signed)
 BP 137/82   Pulse 88   Temp 98 F (36.7 C) (Oral)   Ht 5' 3 (1.6 m)   Wt 176 lb 12.8 oz (80.2 kg)   SpO2 95%   BMI 31.32 kg/m    Subjective:    Patient ID: Mitchell [name], male    DOB: 11/13/1955, 68 y.o.   MRN: [id number]  HPI: Mitchell [name] is a 68 y.o. male presenting on 09/02/2024 for comprehensive medical examination. Current medical complaints include:  HYPERTENSION / HYPERLIPIDEMIA Satisfied with current treatment? yes Duration of hypertension: chronic BP monitoring frequency: not checking BP medication side effects: no Past BP meds: lisinopril  Duration of hyperlipidemia: chronic Cholesterol medication side effects: yes Cholesterol supplements: none Past cholesterol medications: crestor  Medication compliance: excellent compliance Aspirin : no Recent stressors: no Recurrent headaches: no Visual changes: no Palpitations: no Dyspnea: no Chest pain: no Lower extremity edema: no Dizzy/lightheaded: no  HYPOTHYROIDISM Thyroid  control status:controlled Satisfied with current treatment? yes Medication side effects: no Medication compliance: excellent compliance Etiology of hypothyroidism:  Recent dose adjustment:no Fatigue: no Cold intolerance: no Heat intolerance: no Weight gain: no Weight loss: no Constipation: no Diarrhea/loose stools: no Palpitations: no Lower extremity edema: no Anxiety/depressed mood: no  COPD COPD status: stable Satisfied with current treatment?: yes Oxygen use: no Dyspnea frequency: occasionally Cough frequency: occasionally Rescue inhaler frequency:  rarely Limitation of activity: no Productive cough: no Pneumovax: Up to Date Influenza: Up to Date  He currently lives with: wife Interim Problems from his last visit: no  Depression Screen done today and results listed below:     09/02/2024    3:29 PM 04/02/2024    2:34 PM 02/29/2024    2:56 PM 08/31/2023    2:56 PM 03/28/2023    3:30 PM  Depression screen PHQ 2/9   Decreased Interest 0 0 0 0 0  Down, Depressed, Hopeless 0 0 0 0 0  PHQ - 2 Score 0 0 0 0 0  Altered sleeping 0 0 2 1 0  Tired, decreased energy 2 1 0 0 0  Change in appetite 0 0 0 0 0  Feeling bad or failure about yourself  0 0 0 0 0  Trouble concentrating 0 0 0 0 0  Moving slowly or fidgety/restless 0 0 0 0 0  Suicidal thoughts 0 0 0 0 0  PHQ-9 Score 2 1  2  1   0   Difficult doing work/chores Not difficult at all Not difficult at all Somewhat difficult Not difficult at all Not difficult at all     Data saved with a previous flowsheet row definition    Past Medical History:  Past Medical History:  Diagnosis Date   Anxiety    Cancer (HCC)    skin cancer   Depression    Epididymitis    Hepatitis C    Tested positive, treated now told that he does not have it   Hydrocele    Hypertension    IFG (impaired fasting glucose)    Insomnia    Kidney stone    Schizophrenia [facility])     Surgical History:  Past Surgical History:  Procedure Laterality Date   ANKLE FRACTURE SURGERY     COLONOSCOPY  2010   COLONOSCOPY WITH PROPOFOL   06/07/2016   Procedure: COLONOSCOPY WITH PROPOFOL ;  Surgeon: Rogelia Copping, MD;  Location: [facility];  Service: Endoscopy;;   ESOPHAGOGASTRODUODENOSCOPY (EGD) WITH PROPOFOL  N/A 06/07/2016   Procedure: ESOPHAGOGASTRODUODENOSCOPY (EGD) WITH PROPOFOL ;  Surgeon: Rogelia Copping, MD;  Location: [facility] END[facility]COPY;  Service: Endoscopy;  Laterality: N/A;   HERNIA REPAIR  2011   X 2    Medications:  Current Outpatient Medications on File Prior to Visit  Medication Sig   fluticasone  (FLONASE ) 50 MCG/ACT nasal spray Place 2 sprays into both nostrils daily. (Patient taking differently: Place 2 sprays into both nostrils daily. prn)   levothyroxine  (SYNTHROID ) 50 MCG tablet TAKE 1 TABLET BY MOUTH DAILY  BEFORE BREAKFAST   loratadine (CLARITIN) 10 MG tablet Take 10 mg by mouth daily.   Multiple Vitamin (MULTI-VITAMINS) TABS Take by mouth.   No current  facility-administered medications on file prior to visit.    Allergies:  Allergies[1]  Social History:  Social History   Socioeconomic History   Marital status: Married    Spouse name: Ellouise   Number of children: 2   Years of education: 9   Highest education level: 9th grade  Occupational History   Occupation: retired   Tobacco Use   Smoking status: Former    Current packs/day: 0.00    Average packs/day: 2.0 packs/day for 26.8 years (53.6 ttl pk-yrs)    Types: Cigarettes    Start date: 59    Quit date: 07/06/1995    Years since quitting: 29.1   Smokeless tobacco: Former    Types: Chew    Quit date: 2015  Vaping Use   Vaping status: Never Used  Substance and Sexual Activity   Alcohol use: Yes    Alcohol/week: 3.0 - 4.0 standard drinks of alcohol    Types: 3 - 4 Cans of beer per week    Comment: remote history, quit 20+ years ago (1-2 beers 1-2 days per week)   Drug use: No    Comment: remote history, quit 20= years ago   Sexual activity: Yes  Other Topics Concern   Not on file  Social History Narrative   Not on file   Social Drivers of Health   Tobacco Use: Medium Risk (09/02/2024)   Patient History    Smoking Tobacco Use: Former    Smokeless Tobacco Use: Former    Passive Exposure: Not on Actuary Strain: Low Risk (04/02/2024)   Overall Financial Resource Strain (CARDIA)    Difficulty of Paying Living Expenses: Not hard at all  Food Insecurity: No Food Insecurity (04/02/2024)   Epic    Worried About Programme Researcher, Broadcasting/film/video in the Last Year: Never true    Ran Out of Food in the Last Year: Never true  Transportation Needs: No Transportation Needs (04/02/2024)   Epic    Lack of Transportation (Medical): No    Lack of Transportation (Non-Medical): No  Physical Activity: Insufficiently Active (04/02/2024)   Exercise Vital Sign    Days of Exercise per Week: 7 days    Minutes of Exercise per Session: 10 min  Stress: No Stress Concern Present  (04/02/2024)   [employer] of Occupational Health - Occupational Stress Questionnaire    Feeling of Stress: Not at all  Social Connections: Moderately Integrated (04/02/2024)   Social Connection and Isolation Panel    Frequency of Communication with Friends and Family: Twice a week    Frequency of Social Gatherings with Friends and Family: Twice a week    Attends Religious Services: More than 4 times per year    Active Member of [employer] or Organizations: No    Attends Banker Meetings: Never    Marital Status: Married  Catering Manager Violence: Not At Risk (  04/02/2024)   Epic    Fear of Current or Ex-Partner: No    Emotionally Abused: No    Physically Abused: No    Sexually Abused: No  Depression (PHQ2-9): Low Risk (09/02/2024)   Depression (PHQ2-9)    PHQ-2 Score: 2  Alcohol Screen: Low Risk (04/02/2024)   Alcohol Screen    Last Alcohol Screening Score (AUDIT): 3  Housing: Low Risk (04/02/2024)   Epic    Unable to Pay for Housing in the Last Year: No    Number of Times Moved in the Last Year: 0    Homeless in the Last Year: No  Utilities: Not At Risk (04/02/2024)   Epic    Threatened with loss of utilities: No  Health Literacy: Adequate Health Literacy (04/02/2024)   B1300 Health Literacy    Frequency of need for help with medical instructions: Never   Tobacco Use History[2] Social History   Substance and Sexual Activity  Alcohol Use Yes   Alcohol/week: 3.0 - 4.0 standard drinks of alcohol   Types: 3 - 4 Cans of beer per week   Comment: remote history, quit 20+ years ago (1-2 beers 1-2 days per week)    Family History:  Family History  Problem Relation Age of Onset   Diabetes Mother    Alzheimer's disease Father    Diabetes Brother    Diabetes Maternal Grandfather     Past medical history, surgical history, medications, allergies, family history and social history reviewed with patient today and changes made to appropriate areas of the chart.    Review of Systems  Constitutional: Negative.   HENT: Negative.    Eyes:  Positive for blurred vision. Negative for double vision, photophobia, pain, discharge and redness.  Respiratory:  Positive for wheezing. Negative for cough, hemoptysis, sputum production and shortness of breath.   Cardiovascular:  Positive for leg swelling. Negative for chest pain, palpitations, orthopnea, claudication and PND.  Gastrointestinal: Negative.   Genitourinary: Negative.   Musculoskeletal:  Positive for myalgias. Negative for back pain, falls, joint pain and neck pain.  Skin: Negative.   Neurological: Negative.   Endo/Heme/Allergies: Negative.   Psychiatric/Behavioral: Negative.     All other ROS negative except what is listed above and in the HPI.      Objective:    BP 137/82   Pulse 88   Temp 98 F (36.7 C) (Oral)   Ht 5' 3 (1.6 m)   Wt 176 lb 12.8 oz (80.2 kg)   SpO2 95%   BMI 31.32 kg/m   Wt Readings from Last 3 Encounters:  09/02/24 176 lb 12.8 oz (80.2 kg)  04/02/24 175 lb (79.4 kg)  03/04/24 171 lb 9.6 oz (77.8 kg)    Physical Exam Vitals and nursing note reviewed.  Constitutional:      General: He is not in acute distress.    Appearance: Normal appearance. He is obese. He is not ill-appearing, toxic-appearing or diaphoretic.  HENT:     Head: Normocephalic and atraumatic.     Right Ear: Tympanic membrane, ear canal and external ear normal. There is no impacted cerumen.     Left Ear: Tympanic membrane, ear canal and external ear normal. There is no impacted cerumen.     Nose: Nose normal. No congestion or rhinorrhea.     Mouth/Throat:     Mouth: Mucous membranes are moist.     Pharynx: Oropharynx is clear. No oropharyngeal exudate or posterior oropharyngeal erythema.  Eyes:     General:  No scleral icterus.       Right eye: No discharge.        Left eye: No discharge.     Extraocular Movements: Extraocular movements intact.     Conjunctiva/sclera: Conjunctivae normal.      Pupils: Pupils are equal, round, and reactive to light.  Neck:     Vascular: No carotid bruit.  Cardiovascular:     Rate and Rhythm: Normal rate and regular rhythm.     Pulses: Normal pulses.     Heart sounds: No murmur heard.    No friction rub. No gallop.  Pulmonary:     Effort: Pulmonary effort is normal. No respiratory distress.     Breath sounds: Normal breath sounds. No stridor. No wheezing, rhonchi or rales.  Chest:     Chest wall: No tenderness.  Abdominal:     General: Abdomen is flat. Bowel sounds are normal. There is no distension.     Palpations: Abdomen is soft. There is no mass.     Tenderness: There is no abdominal tenderness. There is no right CVA tenderness, left CVA tenderness, guarding or rebound.     Hernia: No hernia is present.  Genitourinary:    Comments: Genital exam deferred with shared decision making Musculoskeletal:        General: No swelling, tenderness, deformity or signs of injury.     Cervical back: Normal range of motion and neck supple. No rigidity. No muscular tenderness.     Right lower leg: No edema.     Left lower leg: No edema.  Lymphadenopathy:     Cervical: No cervical adenopathy.  Skin:    General: Skin is warm and dry.     Capillary Refill: Capillary refill takes less than 2 seconds.     Coloration: Skin is not jaundiced or pale.     Findings: No bruising, erythema, lesion or rash.  Neurological:     General: No focal deficit present.     Mental Status: He is alert and oriented to person, place, and time.     Cranial Nerves: No cranial nerve deficit.     Sensory: No sensory deficit.     Motor: No weakness.     Coordination: Coordination normal.     Gait: Gait normal.     Deep Tendon Reflexes: Reflexes normal.  Psychiatric:        Mood and Affect: Mood normal.        Behavior: Behavior normal.        Thought Content: Thought content normal.        Judgment: Judgment normal.     Results for orders placed or performed in visit  on 02/29/24  Bayer DCA Hb A1c Waived   Collection Time: 02/29/24  2:54 PM  Result Value Ref Range   HB A1C (BAYER DCA - WAIVED) 6.3 (H) 4.8 - 5.6 %  CBC with Differential/Platelet   Collection Time: 02/29/24  2:55 PM  Result Value Ref Range   WBC 6.7 3.4 - 10.8 x10E3/uL   RBC 4.71 4.14 - 5.80 x10E6/uL   Hemoglobin 15.5 13.0 - 17.7 g/dL   Hematocrit 53.8 62.4 - 51.0 %   MCV 98 (H) 79 - 97 fL   MCH 32.9 26.6 - 33.0 pg   MCHC 33.6 31.5 - 35.7 g/dL   RDW 86.4 88.3 - 84.5 %   Platelets 267 150 - 450 x10E3/uL   Neutrophils 58 Not Estab. %   Lymphs 28 Not Estab. %  Monocytes 9 Not Estab. %   Eos 4 Not Estab. %   Basos 1 Not Estab. %   Neutrophils Absolute 3.9 1.4 - 7.0 x10E3/uL   Lymphocytes Absolute 1.9 0.7 - 3.1 x10E3/uL   Monocytes Absolute 0.6 0.1 - 0.9 x10E3/uL   EOS (ABSOLUTE) 0.3 0.0 - 0.4 x10E3/uL   Basophils Absolute 0.0 0.0 - 0.2 x10E3/uL   Immature Granulocytes 0 Not Estab. %   Immature Grans (Abs) 0.0 0.0 - 0.1 x10E3/uL  Lipid Panel w/o Chol/HDL Ratio   Collection Time: 02/29/24  2:55 PM  Result Value Ref Range   Cholesterol, Total 167 100 - 199 mg/dL   Triglycerides 658 (H) 0 - 149 mg/dL   HDL 39 (L) >60 mg/dL   VLDL Cholesterol Cal 54 (H) 5 - 40 mg/dL   LDL Chol Calc (NIH) 74 0 - 99 mg/dL  TSH   Collection Time: 02/29/24  2:55 PM  Result Value Ref Range   TSH 4.120 0.450 - 4.500 uIU/mL  Comprehensive metabolic panel with GFR   Collection Time: 02/29/24  2:55 PM  Result Value Ref Range   Glucose 170 (H) 70 - 99 mg/dL   BUN 20 8 - 27 mg/dL   Creatinine, Ser 8.72 0.76 - 1.27 mg/dL   eGFR 61 >40 fO/fpw/8.26   BUN/Creatinine Ratio 16 10 - 24   Sodium 137 134 - 144 mmol/L   Potassium 4.6 3.5 - 5.2 mmol/L   Chloride 103 96 - 106 mmol/L   CO2 19 (L) 20 - 29 mmol/L   Calcium  9.0 8.6 - 10.2 mg/dL   Total Protein 7.3 6.0 - 8.5 g/dL   Albumin 4.3 3.9 - 4.9 g/dL   Globulin, Total 3.0 1.5 - 4.5 g/dL   Bilirubin Total 0.3 0.0 - 1.2 mg/dL   Alkaline Phosphatase 58 44  - 121 IU/L   AST 33 0 - 40 IU/L   ALT 28 0 - 44 IU/L      Assessment & Plan:   Problem List Items Addressed This Visit       Cardiovascular and Mediastinum   Hypertension   Under good control on current regimen. Continue current regimen. Continue to monitor. Call with any concerns. Refills given. Labs drawn today.        Relevant Medications   lisinopril  (ZESTRIL ) 5 MG tablet   rosuvastatin  (CRESTOR ) 10 MG tablet   Other Relevant Orders   Comprehensive metabolic panel with GFR   CBC with Differential/Platelet   Microalbumin, Urine Waived     Digestive   Hepatitis C   Rechecking labs today. Await results. Treat as needed.       Relevant Orders   Comprehensive metabolic panel with GFR   CBC with Differential/Platelet   HCV RNA quant     Endocrine   IFG (impaired fasting glucose)   Rechecking labs today. Await results. Treat as needed.       Relevant Orders   Comprehensive metabolic panel with GFR   CBC with Differential/Platelet   Bayer DCA Hb A1c Waived   Hypothyroidism   Rechecking labs today. Await results. Treat as needed.       Relevant Orders   Comprehensive metabolic panel with GFR   CBC with Differential/Platelet   TSH     Genitourinary   BPH with obstruction/lower urinary tract symptoms   Under good control on current regimen. Continue current regimen. Continue to monitor. Call with any concerns. Refills given. Labs drawn today.       Relevant Orders  Comprehensive metabolic panel with GFR   CBC with Differential/Platelet   PSA     Other   Anxiety   Under good control on current regimen. Continue current regimen. Continue to monitor. Call with any concerns. Refills given. Labs drawn today.       Relevant Medications   hydrOXYzine  (ATARAX ) 25 MG tablet   Depression   Under good control on current regimen. Continue current regimen. Continue to monitor. Call with any concerns. Refills given. Labs drawn today.       Relevant Medications    hydrOXYzine  (ATARAX ) 25 MG tablet   Other Relevant Orders   Comprehensive metabolic panel with GFR   CBC with Differential/Platelet   Hyperlipidemia   Under good control on current regimen. Continue current regimen. Continue to monitor. Call with any concerns. Refills given. Labs drawn today.       Relevant Medications   lisinopril  (ZESTRIL ) 5 MG tablet   rosuvastatin  (CRESTOR ) 10 MG tablet   Other Relevant Orders   Comprehensive metabolic panel with GFR   CBC with Differential/Platelet   Lipid Panel w/o Chol/HDL Ratio   Other Visit Diagnoses       Routine general medical examination at a health care facility    -  Primary   Vaccines up to date. Screening labs checked today. Colonoscopy up to date. Continue diet and exercise. Call with any concerns.        LABORATORY TESTING:  Health maintenance labs ordered today as discussed above.   The natural history of prostate cancer and ongoing controversy regarding screening and potential treatment outcomes of prostate cancer has been discussed with the patient. The meaning of a false positive PSA and a false negative PSA has been discussed. He indicates understanding of the limitations of this screening test and wishes to proceed with screening PSA testing.   IMMUNIZATIONS:   - Tdap: Tetanus vaccination status reviewed: last tetanus booster within 10 years. - Influenza: Up to date - Pneumovax: Up to date - Prevnar: Up to date - COVID: Up to date - HPV: Not applicable - Shingrix vaccine: Given elsewhere  SCREENING: - Colonoscopy: Up to date  Discussed with patient purpose of the colonoscopy is to detect colon cancer at curable precancerous or early stages   PATIENT COUNSELING:    Sexuality: Discussed sexually transmitted diseases, partner selection, use of condoms, avoidance of unintended pregnancy  and contraceptive alternatives.   Advised to avoid cigarette smoking.  I discussed with the patient that most people either  abstain from alcohol or drink within safe limits (<=14/week and <=4 drinks/occasion for males, <=7/weeks and <= 3 drinks/occasion for females) and that the risk for alcohol disorders and other health effects rises proportionally with the number of drinks per week and how often a drinker exceeds daily limits.  Discussed cessation/primary prevention of drug use and availability of treatment for abuse.   Diet: Encouraged to adjust caloric intake to maintain  or achieve ideal body weight, to reduce intake of dietary saturated fat and total fat, to limit sodium intake by avoiding high sodium foods and not adding table salt, and to maintain adequate dietary potassium and calcium  preferably from fresh fruits, vegetables, and low-fat dairy products.    stressed the importance of regular exercise  Injury prevention: Discussed safety belts, safety helmets, smoke detector, smoking near bedding or upholstery.   Dental health: Discussed importance of regular tooth brushing, flossing, and dental visits.   Follow up plan: NEXT PREVENTATIVE PHYSICAL DUE IN 1 YEAR. Return  in about 6 months (around 03/03/2025).     [1]  Allergies Allergen Reactions   Haloperidol Decanoate Other (See Comments)    Muscle pain   Atorvastatin  Other (See Comments)    myalgias   Codeine Nausea And Vomiting  [2]  Social History Tobacco Use  Smoking Status Former   Current packs/day: 0.00   Average packs/day: 2.0 packs/day for 26.8 years (53.6 ttl pk-yrs)   Types: Cigarettes   Start date: 63   Quit date: 07/06/1995   Years since quitting: 29.1  Smokeless Tobacco Former   Types: Chew   Quit date: 2015

## 2024-09-03 ENCOUNTER — Ambulatory Visit: Payer: Self-pay | Admitting: Family Medicine

## 2024-09-03 LAB — TSH: TSH: 4.49 u[IU]/mL (ref 0.450–4.500)

## 2024-09-03 LAB — CBC WITH DIFFERENTIAL/PLATELET
Basophils Absolute: 0.1 x10E3/uL (ref 0.0–0.2)
Basos: 1 %
EOS (ABSOLUTE): 0.2 x10E3/uL (ref 0.0–0.4)
Eos: 3 %
Hematocrit: 45.8 % (ref 37.5–51.0)
Hemoglobin: 15.8 g/dL (ref 13.0–17.7)
Immature Grans (Abs): 0 x10E3/uL (ref 0.0–0.1)
Immature Granulocytes: 0 %
Lymphocytes Absolute: 2.7 x10E3/uL (ref 0.7–3.1)
Lymphs: 39 %
MCH: 32.2 pg (ref 26.6–33.0)
MCHC: 34.5 g/dL (ref 31.5–35.7)
MCV: 93 fL (ref 79–97)
Monocytes Absolute: 0.8 x10E3/uL (ref 0.1–0.9)
Monocytes: 11 %
Neutrophils Absolute: 3.2 x10E3/uL (ref 1.4–7.0)
Neutrophils: 46 %
Platelets: 293 x10E3/uL (ref 150–450)
RBC: 4.91 x10E6/uL (ref 4.14–5.80)
RDW: 13.2 % (ref 11.6–15.4)
WBC: 7 x10E3/uL (ref 3.4–10.8)

## 2024-09-03 LAB — COMPREHENSIVE METABOLIC PANEL WITH GFR
ALT: 26 IU/L (ref 0–44)
AST: 26 IU/L (ref 0–40)
Albumin: 4.1 g/dL (ref 3.9–4.9)
Alkaline Phosphatase: 50 IU/L (ref 47–123)
BUN/Creatinine Ratio: 18 (ref 10–24)
BUN: 20 mg/dL (ref 8–27)
Bilirubin Total: 0.5 mg/dL (ref 0.0–1.2)
CO2: 20 mmol/L (ref 20–29)
Calcium: 9.3 mg/dL (ref 8.6–10.2)
Chloride: 106 mmol/L (ref 96–106)
Creatinine, Ser: 1.11 mg/dL (ref 0.76–1.27)
Globulin, Total: 3.1 g/dL (ref 1.5–4.5)
Glucose: 129 mg/dL — ABNORMAL HIGH (ref 70–99)
Potassium: 4.5 mmol/L (ref 3.5–5.2)
Sodium: 141 mmol/L (ref 134–144)
Total Protein: 7.2 g/dL (ref 6.0–8.5)
eGFR: 72 mL/min/1.73 (ref >59)

## 2024-09-03 LAB — LIPID PANEL W/O CHOL/HDL RATIO
Cholesterol, Total: 170 mg/dL (ref 100–199)
HDL: 45 mg/dL (ref >39)
LDL Chol Calc (NIH): 87 mg/dL (ref 0–99)
Triglycerides: 230 mg/dL — ABNORMAL HIGH (ref 0–149)
VLDL Cholesterol Cal: 38 mg/dL (ref 5–40)

## 2024-09-03 LAB — HCV RNA QUANT: Hepatitis C Quantitation: NOT DETECTED [IU]/mL

## 2024-09-03 LAB — PSA: Prostate Specific Ag, Serum: 0.7 ng/mL (ref 0.0–4.0)

## 2025-01-27 ENCOUNTER — Ambulatory Visit: Admitting: Dermatology

## 2025-03-06 ENCOUNTER — Ambulatory Visit: Admitting: Family Medicine

## 2025-04-08 ENCOUNTER — Ambulatory Visit
